# Patient Record
Sex: Female | Born: 1985 | Race: Black or African American | Hispanic: No | Marital: Single | Smoking: Never smoker
Health system: Southern US, Community
[De-identification: ages and names within clinical notes are randomized; demographics above are authoritative.]

## PROBLEM LIST (undated history)

## (undated) ENCOUNTER — Emergency Department (HOSPITAL_COMMUNITY): Admission: EM | Payer: Self-pay | Source: Home / Self Care

## (undated) ENCOUNTER — Inpatient Hospital Stay (HOSPITAL_COMMUNITY): Payer: Self-pay

## (undated) DIAGNOSIS — D75839 Thrombocytosis, unspecified: Secondary | ICD-10-CM

## (undated) DIAGNOSIS — R51 Headache: Secondary | ICD-10-CM

## (undated) DIAGNOSIS — K760 Fatty (change of) liver, not elsewhere classified: Secondary | ICD-10-CM

## (undated) DIAGNOSIS — K219 Gastro-esophageal reflux disease without esophagitis: Secondary | ICD-10-CM

## (undated) DIAGNOSIS — Z86718 Personal history of other venous thrombosis and embolism: Secondary | ICD-10-CM

## (undated) DIAGNOSIS — Z8719 Personal history of other diseases of the digestive system: Secondary | ICD-10-CM

## (undated) DIAGNOSIS — Z9289 Personal history of other medical treatment: Secondary | ICD-10-CM

## (undated) DIAGNOSIS — Z95828 Presence of other vascular implants and grafts: Secondary | ICD-10-CM

## (undated) DIAGNOSIS — I2699 Other pulmonary embolism without acute cor pulmonale: Secondary | ICD-10-CM

## (undated) DIAGNOSIS — R519 Headache, unspecified: Secondary | ICD-10-CM

## (undated) DIAGNOSIS — E119 Type 2 diabetes mellitus without complications: Secondary | ICD-10-CM

## (undated) DIAGNOSIS — O24419 Gestational diabetes mellitus in pregnancy, unspecified control: Secondary | ICD-10-CM

## (undated) DIAGNOSIS — M1990 Unspecified osteoarthritis, unspecified site: Secondary | ICD-10-CM

## (undated) DIAGNOSIS — I10 Essential (primary) hypertension: Secondary | ICD-10-CM

## (undated) DIAGNOSIS — N3090 Cystitis, unspecified without hematuria: Secondary | ICD-10-CM

## (undated) DIAGNOSIS — D473 Essential (hemorrhagic) thrombocythemia: Secondary | ICD-10-CM

## (undated) DIAGNOSIS — A4902 Methicillin resistant Staphylococcus aureus infection, unspecified site: Secondary | ICD-10-CM

## (undated) DIAGNOSIS — D649 Anemia, unspecified: Secondary | ICD-10-CM

## (undated) HISTORY — PX: WISDOM TOOTH EXTRACTION: SHX21

## (undated) HISTORY — DX: Methicillin resistant Staphylococcus aureus infection, unspecified site: A49.02

---
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
## 2006-02-28 ENCOUNTER — Emergency Department (HOSPITAL_COMMUNITY): Admission: EM | Admit: 2006-02-28 | Discharge: 2006-02-28 | Payer: Self-pay | Admitting: Family Medicine

## 2006-04-17 ENCOUNTER — Emergency Department (HOSPITAL_COMMUNITY): Admission: EM | Admit: 2006-04-17 | Discharge: 2006-04-18 | Payer: Self-pay | Admitting: Emergency Medicine

## 2006-04-20 ENCOUNTER — Emergency Department (HOSPITAL_COMMUNITY): Admission: EM | Admit: 2006-04-20 | Discharge: 2006-04-20 | Payer: Self-pay | Admitting: Family Medicine

## 2006-10-12 ENCOUNTER — Inpatient Hospital Stay (HOSPITAL_COMMUNITY): Admission: EM | Admit: 2006-10-12 | Discharge: 2006-10-14 | Payer: Self-pay | Admitting: Emergency Medicine

## 2007-03-21 ENCOUNTER — Emergency Department (HOSPITAL_COMMUNITY): Admission: EM | Admit: 2007-03-21 | Discharge: 2007-03-21 | Payer: Self-pay | Admitting: Family Medicine

## 2007-05-12 ENCOUNTER — Emergency Department (HOSPITAL_COMMUNITY): Admission: EM | Admit: 2007-05-12 | Discharge: 2007-05-12 | Payer: Self-pay | Admitting: Emergency Medicine

## 2007-05-19 ENCOUNTER — Emergency Department (HOSPITAL_COMMUNITY): Admission: EM | Admit: 2007-05-19 | Discharge: 2007-05-19 | Payer: Self-pay | Admitting: Emergency Medicine

## 2007-08-06 ENCOUNTER — Emergency Department (HOSPITAL_COMMUNITY): Admission: EM | Admit: 2007-08-06 | Discharge: 2007-08-07 | Payer: Self-pay | Admitting: Emergency Medicine

## 2007-08-19 ENCOUNTER — Inpatient Hospital Stay (HOSPITAL_COMMUNITY): Admission: AD | Admit: 2007-08-19 | Discharge: 2007-08-19 | Payer: Self-pay | Admitting: Family Medicine

## 2007-11-10 ENCOUNTER — Ambulatory Visit (HOSPITAL_COMMUNITY): Admission: RE | Admit: 2007-11-10 | Discharge: 2007-11-10 | Payer: Self-pay | Admitting: Obstetrics & Gynecology

## 2007-11-24 ENCOUNTER — Ambulatory Visit (HOSPITAL_COMMUNITY): Admission: RE | Admit: 2007-11-24 | Discharge: 2007-11-24 | Payer: Self-pay | Admitting: Obstetrics & Gynecology

## 2008-02-13 ENCOUNTER — Inpatient Hospital Stay (HOSPITAL_COMMUNITY): Admission: AD | Admit: 2008-02-13 | Discharge: 2008-02-13 | Payer: Self-pay | Admitting: Obstetrics & Gynecology

## 2008-04-06 ENCOUNTER — Ambulatory Visit: Payer: Self-pay | Admitting: Family Medicine

## 2008-04-06 ENCOUNTER — Inpatient Hospital Stay (HOSPITAL_COMMUNITY): Admission: AD | Admit: 2008-04-06 | Discharge: 2008-04-09 | Payer: Self-pay | Admitting: Obstetrics & Gynecology

## 2008-04-08 ENCOUNTER — Encounter (INDEPENDENT_AMBULATORY_CARE_PROVIDER_SITE_OTHER): Payer: Self-pay | Admitting: Pediatrics

## 2008-11-12 ENCOUNTER — Inpatient Hospital Stay (HOSPITAL_COMMUNITY): Admission: AD | Admit: 2008-11-12 | Discharge: 2008-11-12 | Payer: Self-pay | Admitting: Obstetrics and Gynecology

## 2009-01-26 ENCOUNTER — Inpatient Hospital Stay (HOSPITAL_COMMUNITY): Admission: AD | Admit: 2009-01-26 | Discharge: 2009-01-27 | Payer: Self-pay | Admitting: Obstetrics & Gynecology

## 2009-03-04 ENCOUNTER — Ambulatory Visit: Payer: Self-pay | Admitting: Family Medicine

## 2009-03-04 ENCOUNTER — Inpatient Hospital Stay (HOSPITAL_COMMUNITY): Admission: AD | Admit: 2009-03-04 | Discharge: 2009-03-04 | Payer: Self-pay | Admitting: Family Medicine

## 2009-03-16 ENCOUNTER — Inpatient Hospital Stay (HOSPITAL_COMMUNITY): Admission: AD | Admit: 2009-03-16 | Discharge: 2009-03-16 | Payer: Self-pay | Admitting: Obstetrics and Gynecology

## 2009-03-19 ENCOUNTER — Inpatient Hospital Stay (HOSPITAL_COMMUNITY): Admission: AD | Admit: 2009-03-19 | Discharge: 2009-03-19 | Payer: Self-pay | Admitting: Family Medicine

## 2009-03-20 ENCOUNTER — Inpatient Hospital Stay (HOSPITAL_COMMUNITY): Admission: AD | Admit: 2009-03-20 | Discharge: 2009-03-22 | Payer: Self-pay | Admitting: Obstetrics & Gynecology

## 2009-03-20 ENCOUNTER — Ambulatory Visit: Payer: Self-pay | Admitting: Obstetrics and Gynecology

## 2009-04-13 ENCOUNTER — Emergency Department (HOSPITAL_COMMUNITY): Admission: EM | Admit: 2009-04-13 | Discharge: 2009-04-13 | Payer: Self-pay | Admitting: Family Medicine

## 2009-04-14 ENCOUNTER — Inpatient Hospital Stay (HOSPITAL_COMMUNITY): Admission: EM | Admit: 2009-04-14 | Discharge: 2009-04-19 | Payer: Self-pay | Admitting: Emergency Medicine

## 2009-04-14 ENCOUNTER — Ambulatory Visit: Payer: Self-pay | Admitting: Vascular Surgery

## 2009-04-14 ENCOUNTER — Ambulatory Visit: Payer: Self-pay | Admitting: Pulmonary Disease

## 2009-04-14 ENCOUNTER — Encounter (INDEPENDENT_AMBULATORY_CARE_PROVIDER_SITE_OTHER): Payer: Self-pay | Admitting: Internal Medicine

## 2009-04-22 ENCOUNTER — Inpatient Hospital Stay (HOSPITAL_COMMUNITY): Admission: EM | Admit: 2009-04-22 | Discharge: 2009-04-30 | Payer: Self-pay | Admitting: Emergency Medicine

## 2009-04-22 ENCOUNTER — Ambulatory Visit: Payer: Self-pay | Admitting: Hematology & Oncology

## 2009-05-01 ENCOUNTER — Emergency Department (HOSPITAL_COMMUNITY): Admission: EM | Admit: 2009-05-01 | Discharge: 2009-05-01 | Payer: Self-pay | Admitting: Emergency Medicine

## 2009-05-02 ENCOUNTER — Ambulatory Visit: Payer: Self-pay | Admitting: Hematology & Oncology

## 2009-05-04 ENCOUNTER — Encounter: Payer: Self-pay | Admitting: Internal Medicine

## 2009-05-04 LAB — CBC WITH DIFFERENTIAL (CANCER CENTER ONLY)
BASO#: 0.1 10*3/uL (ref 0.0–0.2)
BASO%: 0.6 % (ref 0.0–2.0)
EOS%: 3.5 % (ref 0.0–7.0)
HCT: 31.2 % — ABNORMAL LOW (ref 34.8–46.6)
HGB: 9.6 g/dL — ABNORMAL LOW (ref 11.6–15.9)
LYMPH#: 1.9 10*3/uL (ref 0.9–3.3)
MCH: 21.1 pg — ABNORMAL LOW (ref 26.0–34.0)
MCHC: 30.9 g/dL — ABNORMAL LOW (ref 32.0–36.0)
MCV: 68 fL — ABNORMAL LOW (ref 81–101)
MONO%: 4.7 % (ref 0.0–13.0)
Platelets: 509 10*3/uL — ABNORMAL HIGH (ref 145–400)
RBC: 4.57 10*6/uL (ref 3.70–5.32)
RDW: 22.2 % — ABNORMAL HIGH (ref 10.5–14.6)

## 2009-05-04 LAB — HOLD TUBE, BLOOD BANK - CHCC SATELLITE

## 2009-05-05 LAB — FERRITIN: Ferritin: 85 ng/mL (ref 10–291)

## 2009-05-05 LAB — RETICULOCYTES (CHCC): RBC.: 4.45 MIL/uL (ref 3.87–5.11)

## 2009-06-09 ENCOUNTER — Ambulatory Visit: Payer: Self-pay | Admitting: Hematology & Oncology

## 2009-07-27 ENCOUNTER — Ambulatory Visit: Payer: Self-pay | Admitting: Hematology & Oncology

## 2009-10-23 ENCOUNTER — Inpatient Hospital Stay (HOSPITAL_COMMUNITY): Admission: AD | Admit: 2009-10-23 | Discharge: 2009-10-24 | Payer: Self-pay | Admitting: Obstetrics and Gynecology

## 2009-10-23 ENCOUNTER — Ambulatory Visit: Payer: Self-pay | Admitting: Family

## 2010-01-29 DIAGNOSIS — Z9289 Personal history of other medical treatment: Secondary | ICD-10-CM

## 2010-01-29 HISTORY — DX: Personal history of other medical treatment: Z92.89

## 2010-02-03 ENCOUNTER — Ambulatory Visit: Payer: Self-pay | Admitting: Hematology & Oncology

## 2010-02-06 ENCOUNTER — Encounter: Payer: Self-pay | Admitting: Internal Medicine

## 2010-02-06 LAB — CBC WITH DIFFERENTIAL (CANCER CENTER ONLY)
BASO#: 0.1 10*3/uL (ref 0.0–0.2)
BASO%: 0.5 % (ref 0.0–2.0)
EOS%: 1.8 % (ref 0.0–7.0)
Eosinophils Absolute: 0.2 10*3/uL (ref 0.0–0.5)
HCT: 36 % (ref 34.8–46.6)
HGB: 11.6 g/dL (ref 11.6–15.9)
LYMPH#: 2.8 10*3/uL (ref 0.9–3.3)
LYMPH%: 23.6 % (ref 14.0–48.0)
MCH: 21.9 pg — ABNORMAL LOW (ref 26.0–34.0)
MCHC: 32.2 g/dL (ref 32.0–36.0)
MCV: 68 fL — ABNORMAL LOW (ref 81–101)
MONO#: 0.4 10*3/uL (ref 0.1–0.9)
MONO%: 3.2 % (ref 0.0–13.0)
NEUT#: 8.3 10*3/uL — ABNORMAL HIGH (ref 1.5–6.5)
NEUT%: 70.9 % (ref 39.6–80.0)
Platelets: 429 10*3/uL — ABNORMAL HIGH (ref 145–400)
RBC: 5.29 10*6/uL (ref 3.70–5.32)
RDW: 17.6 % — ABNORMAL HIGH (ref 10.5–14.6)
WBC: 11.7 10*3/uL — ABNORMAL HIGH (ref 3.9–10.0)

## 2010-02-06 LAB — FERRITIN: Ferritin: 23 ng/mL (ref 10–291)

## 2010-02-06 LAB — TECHNOLOGIST REVIEW CHCC SATELLITE

## 2010-02-19 ENCOUNTER — Encounter: Payer: Self-pay | Admitting: Emergency Medicine

## 2010-03-02 NOTE — Letter (Signed)
[facility]   Imported By: Sherian Rein 05/30/2009 14:09:43  _____________________________________________________________________  External Attachment:    Type:   Image     Comment:   External Document

## 2010-03-22 NOTE — Letter (Signed)
Summary: [facility]   Imported By: Lennie Odor 03/17/2010 09:09:39  _____________________________________________________________________  External Attachment:    Type:   Image     Comment:   External Document

## 2010-04-19 LAB — CBC
HCT: 29.1 % — ABNORMAL LOW (ref 36.0–46.0)
HCT: 27 % — ABNORMAL LOW (ref 36.0–46.0)
Hemoglobin: 8.7 g/dL — ABNORMAL LOW (ref 12.0–15.0)
Hemoglobin: 8.6 g/dL — ABNORMAL LOW (ref 12.0–15.0)
MCHC: 31 g/dL (ref 30.0–36.0)
MCHC: 31.7 g/dL (ref 30.0–36.0)
MCV: 68.1 fL — ABNORMAL LOW (ref 78.0–100.0)
MCV: 68.2 fL — ABNORMAL LOW (ref 78.0–100.0)
RBC: 3.65 MIL/uL — ABNORMAL LOW (ref 3.87–5.11)
RBC: 3.96 MIL/uL (ref 3.87–5.11)
WBC: 11.8 10*3/uL — ABNORMAL HIGH (ref 4.0–10.5)

## 2010-04-19 LAB — WET PREP, GENITAL
Trich, Wet Prep: NONE SEEN
Yeast Wet Prep HPF POC: NONE SEEN

## 2010-04-19 LAB — URINALYSIS, ROUTINE W REFLEX MICROSCOPIC
Glucose, UA: NEGATIVE mg/dL
Hgb urine dipstick: NEGATIVE
Ketones, ur: NEGATIVE mg/dL
Protein, ur: NEGATIVE mg/dL
Urobilinogen, UA: 0.2 mg/dL (ref 0.0–1.0)

## 2010-04-19 LAB — BASIC METABOLIC PANEL
BUN: 3 mg/dL — ABNORMAL LOW (ref 6–23)
CO2: 24 mEq/L (ref 19–32)
CO2: 25 mEq/L (ref 19–32)
Chloride: 104 mEq/L (ref 96–112)
Chloride: 108 mEq/L (ref 96–112)
Creatinine, Ser: 0.78 mg/dL (ref 0.4–1.2)
GFR calc Af Amer: >60 mL/min (ref >60)
GFR calc Af Amer: >60 mL/min (ref >60)
Potassium: 3.3 mEq/L — ABNORMAL LOW (ref 3.5–5.1)
Sodium: 139 mEq/L (ref 135–145)

## 2010-04-19 LAB — GRAM STAIN

## 2010-04-19 LAB — CROSSMATCH: Antibody Screen: NEGATIVE

## 2010-04-19 LAB — DIFFERENTIAL
Basophils Relative: 0 % (ref 0–1)
Eosinophils Absolute: 0.2 10*3/uL (ref 0.0–0.7)
Monocytes Relative: 6 % (ref 3–12)
Neutrophils Relative %: 80 % — ABNORMAL HIGH (ref 43–77)

## 2010-04-19 LAB — URINE CULTURE

## 2010-04-24 LAB — CBC
HCT: 22.5 % — ABNORMAL LOW (ref 36.0–46.0)
HCT: 23.3 % — ABNORMAL LOW (ref 36.0–46.0)
HCT: 26.8 % — ABNORMAL LOW (ref 36.0–46.0)
HCT: 27 % — ABNORMAL LOW (ref 36.0–46.0)
HCT: 28.1 % — ABNORMAL LOW (ref 36.0–46.0)
HCT: 28.5 % — ABNORMAL LOW (ref 36.0–46.0)
HCT: 28.5 % — ABNORMAL LOW (ref 36.0–46.0)
HCT: 29 % — ABNORMAL LOW (ref 36.0–46.0)
HCT: 29 % — ABNORMAL LOW (ref 36.0–46.0)
HCT: 29.3 % — ABNORMAL LOW (ref 36.0–46.0)
HCT: 29.6 % — ABNORMAL LOW (ref 36.0–46.0)
Hemoglobin: 7.2 g/dL — ABNORMAL LOW (ref 12.0–15.0)
Hemoglobin: 8.3 g/dL — ABNORMAL LOW (ref 12.0–15.0)
Hemoglobin: 8.5 g/dL — ABNORMAL LOW (ref 12.0–15.0)
Hemoglobin: 8.8 g/dL — ABNORMAL LOW (ref 12.0–15.0)
Hemoglobin: 9.1 g/dL — ABNORMAL LOW (ref 12.0–15.0)
Hemoglobin: 9.3 g/dL — ABNORMAL LOW (ref 12.0–15.0)
MCHC: 30.4 g/dL (ref 30.0–36.0)
MCHC: 31.3 g/dL (ref 30.0–36.0)
MCHC: 31.3 g/dL (ref 30.0–36.0)
MCV: 64.5 fL — ABNORMAL LOW (ref 78.0–100.0)
MCV: 67.4 fL — ABNORMAL LOW (ref 78.0–100.0)
MCV: 67.9 fL — ABNORMAL LOW (ref 78.0–100.0)
MCV: 68.1 fL — ABNORMAL LOW (ref 78.0–100.0)
MCV: 68.2 fL — ABNORMAL LOW (ref 78.0–100.0)
MCV: 68.2 fL — ABNORMAL LOW (ref 78.0–100.0)
Platelets: 222 10*3/uL (ref 150–400)
Platelets: 267 10*3/uL (ref 150–400)
Platelets: 273 10*3/uL (ref 150–400)
Platelets: 282 10*3/uL (ref 150–400)
Platelets: 348 10*3/uL (ref 150–400)
Platelets: 370 10*3/uL (ref 150–400)
Platelets: 375 10*3/uL (ref 150–400)
Platelets: 387 10*3/uL (ref 150–400)
Platelets: 428 10*3/uL — ABNORMAL HIGH (ref 150–400)
Platelets: 429 10*3/uL — ABNORMAL HIGH (ref 150–400)
RBC: 3.82 MIL/uL — ABNORMAL LOW (ref 3.87–5.11)
RBC: 3.88 MIL/uL (ref 3.87–5.11)
RBC: 3.97 MIL/uL (ref 3.87–5.11)
RBC: 4.12 MIL/uL (ref 3.87–5.11)
RBC: 4.24 MIL/uL (ref 3.87–5.11)
RBC: 4.35 MIL/uL (ref 3.87–5.11)
RDW: 24.9 % — ABNORMAL HIGH (ref 11.5–15.5)
RDW: 27.5 % — ABNORMAL HIGH (ref 11.5–15.5)
RDW: 28 % — ABNORMAL HIGH (ref 11.5–15.5)
RDW: 28.6 % — ABNORMAL HIGH (ref 11.5–15.5)
RDW: 28.6 % — ABNORMAL HIGH (ref 11.5–15.5)
RDW: 28.8 % — ABNORMAL HIGH (ref 11.5–15.5)
RDW: 28.8 % — ABNORMAL HIGH (ref 11.5–15.5)
WBC: 10.9 10*3/uL — ABNORMAL HIGH (ref 4.0–10.5)
WBC: 11.4 10*3/uL — ABNORMAL HIGH (ref 4.0–10.5)
WBC: 11.6 10*3/uL — ABNORMAL HIGH (ref 4.0–10.5)
WBC: 12.6 10*3/uL — ABNORMAL HIGH (ref 4.0–10.5)
WBC: 12.6 10*3/uL — ABNORMAL HIGH (ref 4.0–10.5)
WBC: 9.2 10*3/uL (ref 4.0–10.5)
WBC: 9.4 10*3/uL (ref 4.0–10.5)
WBC: 9.5 10*3/uL (ref 4.0–10.5)
WBC: 9.5 10*3/uL (ref 4.0–10.5)
WBC: 9.7 10*3/uL (ref 4.0–10.5)
WBC: 9.9 10*3/uL (ref 4.0–10.5)

## 2010-04-24 LAB — COMPREHENSIVE METABOLIC PANEL
ALT: 11 U/L (ref 0–35)
AST: 36 U/L (ref 0–37)
AST: 9 U/L (ref 0–37)
Albumin: 2.3 g/dL — ABNORMAL LOW (ref 3.5–5.2)
Alkaline Phosphatase: 71 U/L (ref 39–117)
BUN: 10 mg/dL (ref 6–23)
CO2: 25 mEq/L (ref 19–32)
Chloride: 107 mEq/L (ref 96–112)
Chloride: 113 mEq/L — ABNORMAL HIGH (ref 96–112)
Creatinine, Ser: 0.79 mg/dL (ref 0.4–1.2)
GFR calc Af Amer: >60 mL/min (ref >60)
GFR calc non Af Amer: >60 mL/min (ref >60)
Glucose, Bld: 94 mg/dL (ref 70–99)
Potassium: 3.7 mEq/L (ref 3.5–5.1)
Sodium: 141 mEq/L (ref 135–145)
Total Bilirubin: 0.3 mg/dL (ref 0.3–1.2)
Total Bilirubin: 0.3 mg/dL (ref 0.3–1.2)
Total Protein: 5.7 g/dL — ABNORMAL LOW (ref 6.0–8.3)

## 2010-04-24 LAB — DIFFERENTIAL
Basophils Absolute: 0 10*3/uL (ref 0.0–0.1)
Basophils Absolute: 0 10*3/uL (ref 0.0–0.1)
Basophils Absolute: 0 10*3/uL (ref 0.0–0.1)
Basophils Absolute: 0.1 10*3/uL (ref 0.0–0.1)
Basophils Absolute: 0.1 10*3/uL (ref 0.0–0.1)
Basophils Absolute: 0.1 10*3/uL (ref 0.0–0.1)
Basophils Relative: 0 % (ref 0–1)
Basophils Relative: 1 % (ref 0–1)
Eosinophils Absolute: 0 10*3/uL (ref 0.0–0.7)
Eosinophils Absolute: 0.1 10*3/uL (ref 0.0–0.7)
Eosinophils Absolute: 0.1 10*3/uL (ref 0.0–0.7)
Eosinophils Absolute: 0.1 10*3/uL (ref 0.0–0.7)
Eosinophils Absolute: 0.2 10*3/uL (ref 0.0–0.7)
Eosinophils Relative: 1 % (ref 0–5)
Eosinophils Relative: 2 % (ref 0–5)
Eosinophils Relative: 2 % (ref 0–5)
Eosinophils Relative: 2 % (ref 0–5)
Lymphocytes Relative: 11 % — ABNORMAL LOW (ref 12–46)
Lymphocytes Relative: 16 % (ref 12–46)
Lymphocytes Relative: 19 % (ref 12–46)
Lymphocytes Relative: 9 % — ABNORMAL LOW (ref 12–46)
Lymphocytes Relative: 9 % — ABNORMAL LOW (ref 12–46)
Lymphs Abs: 1.1 10*3/uL (ref 0.7–4.0)
Lymphs Abs: 1.7 10*3/uL (ref 0.7–4.0)
Lymphs Abs: 2 10*3/uL (ref 0.7–4.0)
Lymphs Abs: 2.3 10*3/uL (ref 0.7–4.0)
Monocytes Absolute: 0.5 10*3/uL (ref 0.1–1.0)
Monocytes Absolute: 0.6 10*3/uL (ref 0.1–1.0)
Monocytes Absolute: 0.7 10*3/uL (ref 0.1–1.0)
Monocytes Absolute: 0.8 10*3/uL (ref 0.1–1.0)
Monocytes Relative: 5 % (ref 3–12)
Monocytes Relative: 5 % (ref 3–12)
Monocytes Relative: 6 % (ref 3–12)
Neutro Abs: 10.3 10*3/uL — ABNORMAL HIGH (ref 1.7–7.7)
Neutro Abs: 6 10*3/uL (ref 1.7–7.7)
Neutro Abs: 7.8 10*3/uL — ABNORMAL HIGH (ref 1.7–7.7)
Neutro Abs: 9.1 10*3/uL — ABNORMAL HIGH (ref 1.7–7.7)
Neutro Abs: 9.7 10*3/uL — ABNORMAL HIGH (ref 1.7–7.7)
Neutrophils Relative %: 67 % (ref 43–77)
Neutrophils Relative %: 71 % (ref 43–77)
Neutrophils Relative %: 72 % (ref 43–77)
Neutrophils Relative %: 83 % — ABNORMAL HIGH (ref 43–77)
Neutrophils Relative %: 86 % — ABNORMAL HIGH (ref 43–77)

## 2010-04-24 LAB — PROTHROMBIN GENE MUTATION

## 2010-04-24 LAB — RETICULOCYTES: Retic Ct Pct: 0.9 % (ref 0.4–3.1)

## 2010-04-24 LAB — FIBRINOGEN
Fibrinogen: 240 mg/dL (ref 204–475)
Fibrinogen: 327 mg/dL (ref 204–475)
Fibrinogen: 460 mg/dL (ref 204–475)
Fibrinogen: 652 mg/dL — ABNORMAL HIGH (ref 204–475)

## 2010-04-24 LAB — GLUCOSE, CAPILLARY
Glucose-Capillary: 111 mg/dL — ABNORMAL HIGH (ref 70–99)
Glucose-Capillary: 88 mg/dL (ref 70–99)

## 2010-04-24 LAB — VANCOMYCIN, TROUGH
Vancomycin Tr: 15 ug/mL (ref 10.0–20.0)
Vancomycin Tr: 29.4 ug/mL (ref 10.0–20.0)

## 2010-04-24 LAB — POCT CARDIAC MARKERS
Troponin i, poc: 0.1 ng/mL — ABNORMAL HIGH (ref 0.00–0.09)
Troponin i, poc: 0.45 ng/mL (ref 0.00–0.09)

## 2010-04-24 LAB — POCT I-STAT, CHEM 8
Calcium, Ion: 1.12 mmol/L (ref 1.12–1.32)
Creatinine, Ser: 1.2 mg/dL (ref 0.4–1.2)
Glucose, Bld: 93 mg/dL (ref 70–99)
HCT: 30 % — ABNORMAL LOW (ref 36.0–46.0)
Hemoglobin: 10.2 g/dL — ABNORMAL LOW (ref 12.0–15.0)

## 2010-04-24 LAB — BASIC METABOLIC PANEL
BUN: 2 mg/dL — ABNORMAL LOW (ref 6–23)
BUN: 3 mg/dL — ABNORMAL LOW (ref 6–23)
CO2: 24 mEq/L (ref 19–32)
CO2: 25 mEq/L (ref 19–32)
Calcium: 7.8 mg/dL — ABNORMAL LOW (ref 8.4–10.5)
Calcium: 8.4 mg/dL (ref 8.4–10.5)
Calcium: 8.6 mg/dL (ref 8.4–10.5)
Calcium: 8.8 mg/dL (ref 8.4–10.5)
Chloride: 102 mEq/L (ref 96–112)
Chloride: 105 mEq/L (ref 96–112)
Chloride: 106 mEq/L (ref 96–112)
Chloride: 109 mEq/L (ref 96–112)
Chloride: 110 mEq/L (ref 96–112)
Creatinine, Ser: 0.58 mg/dL (ref 0.4–1.2)
Creatinine, Ser: 0.64 mg/dL (ref 0.4–1.2)
Creatinine, Ser: 0.76 mg/dL (ref 0.4–1.2)
Creatinine, Ser: 0.76 mg/dL (ref 0.4–1.2)
GFR calc Af Amer: >60 mL/min (ref >60)
GFR calc Af Amer: >60 mL/min (ref >60)
GFR calc Af Amer: >60 mL/min (ref >60)
GFR calc non Af Amer: >60 mL/min (ref >60)
GFR calc non Af Amer: >60 mL/min (ref >60)
GFR calc non Af Amer: >60 mL/min (ref >60)
GFR calc non Af Amer: >60 mL/min (ref >60)
GFR calc non Af Amer: >60 mL/min (ref >60)
GFR calc non Af Amer: >60 mL/min (ref >60)
GFR calc non Af Amer: >60 mL/min (ref >60)
Glucose, Bld: 86 mg/dL (ref 70–99)
Glucose, Bld: 88 mg/dL (ref 70–99)
Glucose, Bld: 90 mg/dL (ref 70–99)
Glucose, Bld: 96 mg/dL (ref 70–99)
Glucose, Bld: 98 mg/dL (ref 70–99)
Potassium: 3.4 mEq/L — ABNORMAL LOW (ref 3.5–5.1)
Potassium: 3.5 mEq/L (ref 3.5–5.1)
Potassium: 3.6 mEq/L (ref 3.5–5.1)
Potassium: 3.7 mEq/L (ref 3.5–5.1)
Sodium: 137 mEq/L (ref 135–145)
Sodium: 139 mEq/L (ref 135–145)
Sodium: 139 mEq/L (ref 135–145)
Sodium: 141 mEq/L (ref 135–145)
Sodium: 142 mEq/L (ref 135–145)

## 2010-04-24 LAB — CULTURE, BLOOD (ROUTINE X 2)
Culture: NO GROWTH
Culture: NO GROWTH

## 2010-04-24 LAB — ANTITHROMBIN III: AntiThromb III Func: 131 % — ABNORMAL HIGH (ref 76–126)

## 2010-04-24 LAB — PROTEIN S ACTIVITY: Protein S Activity: 78 % (ref 69–129)

## 2010-04-24 LAB — CK TOTAL AND CKMB (NOT AT ARMC)
CK, MB: 0.3 ng/mL (ref 0.3–4.0)
Total CK: 65 U/L (ref 7–177)

## 2010-04-24 LAB — HEPARIN LEVEL (UNFRACTIONATED)
Heparin Unfractionated: 0.12 IU/mL — ABNORMAL LOW (ref 0.30–0.70)
Heparin Unfractionated: 0.17 IU/mL — ABNORMAL LOW (ref 0.30–0.70)
Heparin Unfractionated: 0.2 IU/mL — ABNORMAL LOW (ref 0.30–0.70)
Heparin Unfractionated: 0.21 IU/mL — ABNORMAL LOW (ref 0.30–0.70)
Heparin Unfractionated: 0.26 IU/mL — ABNORMAL LOW (ref 0.30–0.70)
Heparin Unfractionated: 0.28 IU/mL — ABNORMAL LOW (ref 0.30–0.70)
Heparin Unfractionated: 0.3 IU/mL (ref 0.30–0.70)
Heparin Unfractionated: 0.31 IU/mL (ref 0.30–0.70)
Heparin Unfractionated: 0.37 IU/mL (ref 0.30–0.70)
Heparin Unfractionated: 0.38 IU/mL (ref 0.30–0.70)
Heparin Unfractionated: 0.4 IU/mL (ref 0.30–0.70)
Heparin Unfractionated: 0.44 IU/mL (ref 0.30–0.70)
Heparin Unfractionated: 0.6 IU/mL (ref 0.30–0.70)
Heparin Unfractionated: 0.72 IU/mL — ABNORMAL HIGH (ref 0.30–0.70)
Heparin Unfractionated: <0.1 IU/mL — ABNORMAL LOW (ref 0.30–0.70)

## 2010-04-24 LAB — URINE MICROSCOPIC-ADD ON

## 2010-04-24 LAB — URINALYSIS, ROUTINE W REFLEX MICROSCOPIC
Hgb urine dipstick: NEGATIVE
Hgb urine dipstick: NEGATIVE
Nitrite: POSITIVE — AB
Protein, ur: 100 mg/dL — AB
Specific Gravity, Urine: 1.041 — ABNORMAL HIGH (ref 1.005–1.030)
Urobilinogen, UA: 1 mg/dL (ref 0.0–1.0)
pH: 6 (ref 5.0–8.0)

## 2010-04-24 LAB — LUPUS ANTICOAGULANT PANEL
PTT Lupus Anticoagulant: 83.8 secs — ABNORMAL HIGH (ref 32.0–43.4)
PTTLA 4:1 Mix: 70 secs — ABNORMAL HIGH (ref 36.3–48.8)
PTTLA Confirmation: 13.6 secs — ABNORMAL HIGH (ref <8.0)
dRVVT Incubated 1:1 Mix: 50 secs — ABNORMAL HIGH (ref 36.2–44.3)

## 2010-04-24 LAB — CARDIAC PANEL(CRET KIN+CKTOT+MB+TROPI)
CK, MB: 0.4 ng/mL (ref 0.3–4.0)
Troponin I: 0.02 ng/mL (ref 0.00–0.06)

## 2010-04-24 LAB — PROTIME-INR
INR: 1.34 (ref 0.00–1.49)
INR: 1.93 — ABNORMAL HIGH (ref 0.00–1.49)
INR: 2.45 — ABNORMAL HIGH (ref 0.00–1.49)
INR: 4.46 — ABNORMAL HIGH (ref 0.00–1.49)
INR: 4.67 — ABNORMAL HIGH (ref 0.00–1.49)
Prothrombin Time: 16 seconds — ABNORMAL HIGH (ref 11.6–15.2)
Prothrombin Time: 16.5 seconds — ABNORMAL HIGH (ref 11.6–15.2)
Prothrombin Time: 21.9 seconds — ABNORMAL HIGH (ref 11.6–15.2)
Prothrombin Time: 23.6 seconds — ABNORMAL HIGH (ref 11.6–15.2)
Prothrombin Time: 27.3 seconds — ABNORMAL HIGH (ref 11.6–15.2)
Prothrombin Time: 42.1 seconds — ABNORMAL HIGH (ref 11.6–15.2)
Prothrombin Time: 43.7 seconds — ABNORMAL HIGH (ref 11.6–15.2)

## 2010-04-24 LAB — CROSSMATCH
ABO/RH(D): A POS
Antibody Screen: NEGATIVE

## 2010-04-24 LAB — URINE CULTURE

## 2010-04-24 LAB — FOLATE
Folate: 5.8 ng/mL
Folate: 6.2 ng/mL

## 2010-04-24 LAB — LIPID PANEL
HDL: 30 mg/dL — ABNORMAL LOW (ref >39)
Triglycerides: 228 mg/dL — ABNORMAL HIGH (ref <150)
VLDL: 46 mg/dL — ABNORMAL HIGH (ref 0–40)

## 2010-04-24 LAB — IRON AND TIBC
Saturation Ratios: 5 % — ABNORMAL LOW (ref 20–55)
TIBC: 236 ug/dL — ABNORMAL LOW (ref 250–470)
UIBC: 224 ug/dL

## 2010-04-24 LAB — PROTEIN C ACTIVITY: Protein C Activity: 120 % (ref 75–133)

## 2010-04-24 LAB — ANTIPHOSPHOLIPID SYNDROME EVAL, BLD
Anticardiolipin IgA: 4 APL U/mL — ABNORMAL LOW (ref <10)
Phosphatydalserine, IgG: <10 U/mL (ref <10)

## 2010-04-24 LAB — CARDIOLIPIN ANTIBODIES, IGG, IGM, IGA: Anticardiolipin IgA: <3 APL U/mL — ABNORMAL LOW (ref <10)

## 2010-04-24 LAB — MRSA PCR SCREENING: MRSA by PCR: NEGATIVE

## 2010-04-24 LAB — FACTOR 5 LEIDEN

## 2010-04-24 LAB — LACTIC ACID, PLASMA
Lactic Acid, Venous: 0.8 mmol/L (ref 0.5–2.2)
Lactic Acid, Venous: 1 mmol/L (ref 0.5–2.2)

## 2010-04-24 LAB — BRAIN NATRIURETIC PEPTIDE: Pro B Natriuretic peptide (BNP): 85 pg/mL (ref 0.0–100.0)

## 2010-04-24 LAB — FERRITIN: Ferritin: 56 ng/mL (ref 10–291)

## 2010-04-24 LAB — TROPONIN I: Troponin I: 0.02 ng/mL (ref 0.00–0.06)

## 2010-04-24 LAB — VITAMIN B12: Vitamin B-12: 253 pg/mL (ref 211–911)

## 2010-04-28 ENCOUNTER — Inpatient Hospital Stay (INDEPENDENT_AMBULATORY_CARE_PROVIDER_SITE_OTHER)
Admission: RE | Admit: 2010-04-28 | Discharge: 2010-04-28 | Disposition: A | Discharge status: Home / Self Care | Payer: Self-pay | Source: Ambulatory Visit | Attending: Family Medicine | Admitting: Family Medicine

## 2010-04-28 DIAGNOSIS — N390 Urinary tract infection, site not specified: Secondary | ICD-10-CM

## 2010-04-28 LAB — POCT PREGNANCY, URINE: Preg Test, Ur: NEGATIVE

## 2010-04-28 LAB — POCT URINALYSIS DIP (DEVICE)
Glucose, UA: NEGATIVE mg/dL
Hgb urine dipstick: NEGATIVE
Nitrite: POSITIVE — AB
Urobilinogen, UA: 0.2 mg/dL (ref 0.0–1.0)

## 2010-05-01 LAB — CULTURE, ROUTINE-ABSCESS: Gram Stain: NONE SEEN

## 2010-05-01 LAB — CBC
HCT: 24.5 % — ABNORMAL LOW (ref 36.0–46.0)
Hemoglobin: 7.4 g/dL — ABNORMAL LOW (ref 12.0–15.0)
MCHC: 30.3 g/dL (ref 30.0–36.0)
MCV: 61.3 fL — ABNORMAL LOW (ref 78.0–100.0)
Platelets: 337 10*3/uL (ref 150–400)
RBC: 4.15 MIL/uL (ref 3.87–5.11)
RDW: 23.5 % — ABNORMAL HIGH (ref 11.5–15.5)
WBC: 12.8 10*3/uL — ABNORMAL HIGH (ref 4.0–10.5)

## 2010-05-01 LAB — URINE CULTURE

## 2010-05-01 LAB — WOUND CULTURE

## 2010-05-04 LAB — WET PREP, GENITAL
Clue Cells Wet Prep HPF POC: NONE SEEN
Trich, Wet Prep: NONE SEEN
Yeast Wet Prep HPF POC: NONE SEEN

## 2010-05-04 LAB — URINE MICROSCOPIC-ADD ON

## 2010-05-04 LAB — URINE CULTURE

## 2010-05-04 LAB — URINALYSIS, ROUTINE W REFLEX MICROSCOPIC
Bilirubin Urine: NEGATIVE
Glucose, UA: NEGATIVE mg/dL
Specific Gravity, Urine: 1.015 (ref 1.005–1.030)
Urobilinogen, UA: 1 mg/dL (ref 0.0–1.0)
pH: 7 (ref 5.0–8.0)

## 2010-05-04 LAB — HERPES SIMPLEX VIRUS CULTURE

## 2010-05-11 LAB — CBC
HCT: 30.4 % — ABNORMAL LOW (ref 36.0–46.0)
Hemoglobin: 9.6 g/dL — ABNORMAL LOW (ref 12.0–15.0)
MCHC: 31.5 g/dL (ref 30.0–36.0)
RBC: 4.62 MIL/uL (ref 3.87–5.11)

## 2010-05-15 LAB — URINE MICROSCOPIC-ADD ON

## 2010-05-15 LAB — URINALYSIS, ROUTINE W REFLEX MICROSCOPIC
Glucose, UA: NEGATIVE mg/dL
Hgb urine dipstick: NEGATIVE
Ketones, ur: NEGATIVE mg/dL
pH: 6.5 (ref 5.0–8.0)

## 2010-05-15 LAB — GC/CHLAMYDIA PROBE AMP, GENITAL: GC Probe Amp, Genital: NEGATIVE

## 2010-05-16 ENCOUNTER — Emergency Department (HOSPITAL_COMMUNITY)
Admission: EM | Admit: 2010-05-16 | Discharge: 2010-05-17 | Disposition: A | Discharge status: Home / Self Care | Payer: Medicaid Other | Attending: Emergency Medicine | Admitting: Emergency Medicine

## 2010-05-16 ENCOUNTER — Emergency Department (HOSPITAL_COMMUNITY): Payer: Medicaid Other

## 2010-05-16 DIAGNOSIS — R0789 Other chest pain: Secondary | ICD-10-CM | POA: Insufficient documentation

## 2010-05-16 DIAGNOSIS — R000 Tachycardia, unspecified: Secondary | ICD-10-CM | POA: Insufficient documentation

## 2010-05-16 DIAGNOSIS — Z86718 Personal history of other venous thrombosis and embolism: Secondary | ICD-10-CM | POA: Insufficient documentation

## 2010-05-16 DIAGNOSIS — R0602 Shortness of breath: Secondary | ICD-10-CM | POA: Insufficient documentation

## 2010-05-16 DIAGNOSIS — N390 Urinary tract infection, site not specified: Secondary | ICD-10-CM | POA: Insufficient documentation

## 2010-05-16 DIAGNOSIS — R42 Dizziness and giddiness: Secondary | ICD-10-CM | POA: Insufficient documentation

## 2010-05-16 DIAGNOSIS — Z79899 Other long term (current) drug therapy: Secondary | ICD-10-CM | POA: Insufficient documentation

## 2010-05-16 DIAGNOSIS — J45909 Unspecified asthma, uncomplicated: Secondary | ICD-10-CM | POA: Insufficient documentation

## 2010-05-16 LAB — URINALYSIS, ROUTINE W REFLEX MICROSCOPIC
Bilirubin Urine: NEGATIVE
Glucose, UA: NEGATIVE mg/dL
Hgb urine dipstick: NEGATIVE
Ketones, ur: NEGATIVE mg/dL
pH: 7.5 (ref 5.0–8.0)

## 2010-05-16 LAB — DIFFERENTIAL
Lymphocytes Relative: 27 % (ref 12–46)
Lymphs Abs: 3.8 10*3/uL (ref 0.7–4.0)
Monocytes Relative: 5 % (ref 3–12)
Neutrophils Relative %: 66 % (ref 43–77)

## 2010-05-16 LAB — CBC
HCT: 35.8 % — ABNORMAL LOW (ref 36.0–46.0)
Hemoglobin: 11.2 g/dL — ABNORMAL LOW (ref 12.0–15.0)
MCV: 67.8 fL — ABNORMAL LOW (ref 78.0–100.0)
RBC: 5.28 MIL/uL — ABNORMAL HIGH (ref 3.87–5.11)
WBC: 14.2 10*3/uL — ABNORMAL HIGH (ref 4.0–10.5)

## 2010-05-16 LAB — POCT PREGNANCY, URINE: Preg Test, Ur: NEGATIVE

## 2010-05-16 LAB — URINE MICROSCOPIC-ADD ON

## 2010-05-16 LAB — BASIC METABOLIC PANEL
BUN: 6 mg/dL (ref 6–23)
CO2: 25 mEq/L (ref 19–32)
Chloride: 105 mEq/L (ref 96–112)
Glucose, Bld: 88 mg/dL (ref 70–99)
Potassium: 3.5 mEq/L (ref 3.5–5.1)

## 2010-05-16 MED ORDER — IOHEXOL 300 MG/ML  SOLN
100.0000 mL | Freq: Once | INTRAMUSCULAR | Status: AC | PRN
  Administered 2010-05-16: 100 mL via INTRAVENOUS

## 2010-05-18 LAB — URINE CULTURE

## 2010-05-26 ENCOUNTER — Other Ambulatory Visit: Payer: Self-pay | Admitting: Family

## 2010-05-26 ENCOUNTER — Other Ambulatory Visit: Payer: Self-pay | Admitting: Hematology & Oncology

## 2010-05-26 ENCOUNTER — Encounter (HOSPITAL_BASED_OUTPATIENT_CLINIC_OR_DEPARTMENT_OTHER): Payer: Medicaid Other | Admitting: Hematology & Oncology

## 2010-05-26 DIAGNOSIS — Z7901 Long term (current) use of anticoagulants: Secondary | ICD-10-CM

## 2010-05-26 DIAGNOSIS — Z86718 Personal history of other venous thrombosis and embolism: Secondary | ICD-10-CM

## 2010-05-26 DIAGNOSIS — I2699 Other pulmonary embolism without acute cor pulmonale: Secondary | ICD-10-CM

## 2010-05-26 DIAGNOSIS — D509 Iron deficiency anemia, unspecified: Secondary | ICD-10-CM

## 2010-05-26 DIAGNOSIS — I82220 Acute embolism and thrombosis of inferior vena cava: Secondary | ICD-10-CM

## 2010-05-26 LAB — CBC WITH DIFFERENTIAL (CANCER CENTER ONLY)
BASO#: 0 10*3/uL (ref 0.0–0.2)
HCT: 35.8 % (ref 34.8–46.6)
HGB: 11.4 g/dL — ABNORMAL LOW (ref 11.6–15.9)
LYMPH#: 2 10*3/uL (ref 0.9–3.3)
MCHC: 31.8 g/dL — ABNORMAL LOW (ref 32.0–36.0)
MONO#: 0.5 10*3/uL (ref 0.1–0.9)
NEUT%: 80.1 % — ABNORMAL HIGH (ref 39.6–80.0)

## 2010-05-26 LAB — FERRITIN: Ferritin: 32 ng/mL (ref 10–291)

## 2010-06-13 NOTE — Op Note (Signed)
Virginia Fitzgerald, COLQUHOUN           ACCOUNT NO.:  [id number]   MEDICAL RECORD NO.:  [id number]          PATIENT TYPE:  INP   LOCATION:  5712                         FACILITY:  [facility]   PHYSICIAN:  Wilmon Arms. [name], M.D. DATE OF BIRTH:  Jun 15, 1985   DATE OF PROCEDURE:  10/12/2006  DATE OF DISCHARGE:                               OPERATIVE REPORT   PREOPERATIVE DIAGNOSIS:  Left thigh abscess.   POSTOPERATIVE DIAGNOSIS:  Left thigh abscess.   PROCEDURE PERFORMED:  Incision and drainage of left thigh abscess.   SURGEON:  Wilmon Arms. [name], M.D., FACS   ANESTHESIA:  General endotracheal.   INDICATIONS:  The patient is a 25 year old female who is morbidly obese,  who presents with a very large left upper thigh abscess.  This is right  at the groin.  There is a lot of surrounding inflammation and the  patient's white count is elevated.  She denies any fever.  This morning  it began draining a small amount of foul-smelling purulent drainage.   DESCRIPTION OF PROCEDURE:  The patient was brought to the operating room  and placed in the supine position on the operating room table.  After an  adequate level of general anesthesia was obtained, the patient's legs  were placed in yellow fin stirrups in the lithotomy position.  Her  perineum was exposed.  The patient had some very foul-smelling purulent  drainage from a hole, in the posterior portion of a large abscess on  upper left thigh right at the groin.  She also had some mucous drainage  from her vagina.  Her perineum and left thigh were prepped with Betadine  and draped in sterile fashion.  A time-out was taken to assure the  proper patient and proper procedure.  Cultures were taken through the  abscess opening and these were sent for microbiology.  A suction  catheter was inserted into the opening, and the abscess appeared to  track anteriorly.  There was a lot of thick induration.  I inserted a  finger into the abscess cavity and opened  the abscess cavity anteriorly.  Our incision was about 8 cm in length.  There was a lot of surrounding  induration.  I used an 18-gauge needle on a 10 mL syringe to check some  of the other areas for purulent fluid.  We also made a small counter  incision anteriorly, to check for any undrained purulent fluid  collections.  None were encountered.  We then washed out the large  abscess cavity with pulse lavage of 2 liters of normal saline.  With a  clean left glove, I examined her vagina.  The left vaginal wall did not  appear to be thick or involved in the inflammatory process.  There was  no purulent drainage coming from her vagina.  The wound was then packed  with saline-soaked Kerlix.  An ABD dressing was applied.  The patient  was extubated and brought to the recovery room in stable condition.  All  sponge, instrument, and needle counts were correct.      Wilmon Arms. Tsuei, M.D.  Electronically Signed  MKT/MEDQ  D:  10/12/2006  T:  10/13/2006  Job:  [id number]

## 2010-06-16 NOTE — Discharge Summary (Signed)
Virginia Fitzgerald, Virginia Fitzgerald           ACCOUNT NO.:  [id number]   MEDICAL RECORD NO.:  [id number]          PATIENT TYPE:  INP   LOCATION:  5712                         FACILITY:  [facility]   PHYSICIAN:  Wilmon Arms. [name], M.D. DATE OF BIRTH:  Jul 30, 1985   DATE OF ADMISSION:  10/12/2006  DATE OF DISCHARGE:  10/14/2006                               DISCHARGE SUMMARY   CHIEF COMPLAINT/REASON FOR ADMISSION:  [name] is a 25 year old,  healthy, female patient with prior perianal abscess.  She now presents  with a left upper thigh abscess for 3 days, very tender with a small  amount of purulent drainage.  This area was measured as 12 x 15 cm by  Dr. [name].  Her initial white count was 29,400.  At this point, Dr.  [name] felt that the best course for draining this abscess would be in  the operating room.   HOSPITAL COURSE:  The patient was admitted with a diagnosis of  leukocytosis and large left thigh abscess.  She was taken to the OR on  the date of admission where she underwent an I&D of the left thigh  abscess, briefly to the PACU, and then to the general floor to recover.  Over the next several days, the patient did well.  She tolerated  dressing changes.  She had transient hypokalemia treated with oral  repletion.  She was tolerating a diet and oral pain medications, and by  postoperative day #2, she had decreased pain and swelling with decreased  induration of the wound with beginning signs of granulation.  Cultures  were negative at time of discharge.  The patient was otherwise deemed  appropriate for discharge home with home health following.   FINAL DISCHARGE DIAGNOSIS:  Left thigh abscess status post incision and  drainage of abscess in the operating room.   DISCHARGE MEDICATIONS:  1. Augmentin 875 mg b.i.d.  2. Percocet 5 mg p.o. q.4-6h. p.r.n.   DIET:  No restrictions.   WOUND CARE:  Daily wet-to-dry packing to left thigh.  Home health to  assist.   ACTIVITY:  May  shower.   FOLLOW-UP:  With Dr. [name] in two to three weeks; you need to call for  an appointment.      Allison L. Rondel Jumbo. Tsuei, M.D.  Electronically Signed   ALE/MEDQ  D:  11/18/2006  T:  11/19/2006  Job:  [id number]

## 2010-07-23 ENCOUNTER — Emergency Department (HOSPITAL_COMMUNITY)
Admission: EM | Admit: 2010-07-23 | Discharge: 2010-07-23 | Disposition: A | Discharge status: Home / Self Care | Payer: Medicaid Other | Attending: Emergency Medicine | Admitting: Emergency Medicine

## 2010-07-23 DIAGNOSIS — N949 Unspecified condition associated with female genital organs and menstrual cycle: Secondary | ICD-10-CM | POA: Insufficient documentation

## 2010-07-23 DIAGNOSIS — N760 Acute vaginitis: Secondary | ICD-10-CM | POA: Insufficient documentation

## 2010-07-23 DIAGNOSIS — K6289 Other specified diseases of anus and rectum: Secondary | ICD-10-CM | POA: Insufficient documentation

## 2010-07-23 DIAGNOSIS — B9689 Other specified bacterial agents as the cause of diseases classified elsewhere: Secondary | ICD-10-CM | POA: Insufficient documentation

## 2010-07-23 DIAGNOSIS — R109 Unspecified abdominal pain: Secondary | ICD-10-CM | POA: Insufficient documentation

## 2010-07-23 DIAGNOSIS — A499 Bacterial infection, unspecified: Secondary | ICD-10-CM | POA: Insufficient documentation

## 2010-07-23 DIAGNOSIS — L293 Anogenital pruritus, unspecified: Secondary | ICD-10-CM | POA: Insufficient documentation

## 2010-07-23 DIAGNOSIS — Z975 Presence of (intrauterine) contraceptive device: Secondary | ICD-10-CM | POA: Insufficient documentation

## 2010-07-23 LAB — URINALYSIS, ROUTINE W REFLEX MICROSCOPIC
Bilirubin Urine: NEGATIVE
Glucose, UA: NEGATIVE mg/dL
Hgb urine dipstick: NEGATIVE
Specific Gravity, Urine: 1.025 (ref 1.005–1.030)
Urobilinogen, UA: 0.2 mg/dL (ref 0.0–1.0)

## 2010-07-23 LAB — WET PREP, GENITAL
Trich, Wet Prep: NONE SEEN
Yeast Wet Prep HPF POC: NONE SEEN

## 2010-07-23 LAB — URINE MICROSCOPIC-ADD ON

## 2010-07-25 LAB — URINE CULTURE
Colony Count: >=100000
Culture  Setup Time: 201206242032

## 2010-09-03 ENCOUNTER — Emergency Department (HOSPITAL_COMMUNITY): Payer: Medicaid Other

## 2010-09-03 ENCOUNTER — Inpatient Hospital Stay (HOSPITAL_COMMUNITY)
Admission: EM | Admit: 2010-09-03 | Discharge: 2010-09-08 | DRG: 176 | Disposition: A | Discharge status: Home / Self Care | Payer: Medicaid Other | Attending: Family Medicine | Admitting: Family Medicine

## 2010-09-03 DIAGNOSIS — G43909 Migraine, unspecified, not intractable, without status migrainosus: Secondary | ICD-10-CM | POA: Diagnosis present

## 2010-09-03 DIAGNOSIS — D72829 Elevated white blood cell count, unspecified: Secondary | ICD-10-CM | POA: Diagnosis present

## 2010-09-03 DIAGNOSIS — I2699 Other pulmonary embolism without acute cor pulmonale: Principal | ICD-10-CM | POA: Diagnosis present

## 2010-09-03 DIAGNOSIS — K5900 Constipation, unspecified: Secondary | ICD-10-CM | POA: Diagnosis present

## 2010-09-03 DIAGNOSIS — N760 Acute vaginitis: Secondary | ICD-10-CM | POA: Diagnosis present

## 2010-09-03 DIAGNOSIS — B9689 Other specified bacterial agents as the cause of diseases classified elsewhere: Secondary | ICD-10-CM | POA: Diagnosis present

## 2010-09-03 DIAGNOSIS — A499 Bacterial infection, unspecified: Secondary | ICD-10-CM | POA: Diagnosis present

## 2010-09-03 DIAGNOSIS — D509 Iron deficiency anemia, unspecified: Secondary | ICD-10-CM | POA: Diagnosis present

## 2010-09-03 HISTORY — DX: Anemia, unspecified: D64.9

## 2010-09-03 HISTORY — DX: Thrombocytosis, unspecified: D75.839

## 2010-09-03 HISTORY — DX: Essential (hemorrhagic) thrombocythemia: D47.3

## 2010-09-03 HISTORY — DX: Presence of other vascular implants and grafts: Z95.828

## 2010-09-03 HISTORY — DX: Personal history of other venous thrombosis and embolism: Z86.718

## 2010-09-04 ENCOUNTER — Inpatient Hospital Stay (HOSPITAL_COMMUNITY): Payer: Medicaid Other

## 2010-09-04 ENCOUNTER — Emergency Department (HOSPITAL_COMMUNITY): Payer: Medicaid Other

## 2010-09-04 LAB — CK TOTAL AND CKMB (NOT AT ARMC)
CK, MB: 2.1 ng/mL (ref 0.3–4.0)
CK, MB: 2 ng/mL (ref 0.3–4.0)
Relative Index: 1.8 (ref 0.0–2.5)
Total CK: 112 U/L (ref 7–177)
Total CK: 84 U/L (ref 7–177)

## 2010-09-04 LAB — CBC
Hemoglobin: 13.1 g/dL (ref 12.0–15.0)
MCHC: 33.4 g/dL (ref 30.0–36.0)
MCV: 78 fL (ref 78.0–100.0)
Platelets: 354 10*3/uL (ref 150–400)
RBC: 4.99 MIL/uL (ref 3.87–5.11)
RBC: 5.58 MIL/uL — ABNORMAL HIGH (ref 3.87–5.11)
WBC: 10.5 10*3/uL (ref 4.0–10.5)

## 2010-09-04 LAB — POCT PREGNANCY, URINE: Preg Test, Ur: NEGATIVE

## 2010-09-04 LAB — DIFFERENTIAL
Basophils Absolute: 0 10*3/uL (ref 0.0–0.1)
Basophils Relative: 0 % (ref 0–1)
Monocytes Absolute: 0.5 10*3/uL (ref 0.1–1.0)
Neutro Abs: 8.6 10*3/uL — ABNORMAL HIGH (ref 1.7–7.7)
Neutrophils Relative %: 68 % (ref 43–77)

## 2010-09-04 LAB — BASIC METABOLIC PANEL
Calcium: 9.5 mg/dL (ref 8.4–10.5)
GFR calc Af Amer: >60 mL/min (ref >60)
GFR calc non Af Amer: >60 mL/min (ref >60)
Glucose, Bld: 200 mg/dL — ABNORMAL HIGH (ref 70–99)
Potassium: 3.5 mEq/L (ref 3.5–5.1)
Sodium: 138 mEq/L (ref 135–145)

## 2010-09-04 LAB — PROTIME-INR: Prothrombin Time: 13.6 seconds (ref 11.6–15.2)

## 2010-09-04 LAB — POCT I-STAT, CHEM 8
HCT: 43 % (ref 36.0–46.0)
Hemoglobin: 14.6 g/dL (ref 12.0–15.0)
Sodium: 141 mEq/L (ref 135–145)
TCO2: 27 mmol/L (ref 0–100)

## 2010-09-04 LAB — MRSA PCR SCREENING: MRSA by PCR: NEGATIVE

## 2010-09-04 MED ORDER — IOHEXOL 300 MG/ML  SOLN
100.0000 mL | Freq: Once | INTRAMUSCULAR | Status: AC | PRN
  Administered 2010-09-04: 100 mL via INTRAVENOUS

## 2010-09-04 MED ORDER — IOHEXOL 350 MG/ML SOLN
100.0000 mL | Freq: Once | INTRAVENOUS | Status: AC | PRN
  Administered 2010-09-04: 100 mL via INTRAVENOUS

## 2010-09-05 ENCOUNTER — Inpatient Hospital Stay (HOSPITAL_COMMUNITY): Payer: Medicaid Other

## 2010-09-05 ENCOUNTER — Encounter (HOSPITAL_COMMUNITY): Payer: Self-pay | Admitting: Radiology

## 2010-09-05 DIAGNOSIS — R0602 Shortness of breath: Secondary | ICD-10-CM

## 2010-09-05 DIAGNOSIS — I2699 Other pulmonary embolism without acute cor pulmonale: Secondary | ICD-10-CM

## 2010-09-05 DIAGNOSIS — I82409 Acute embolism and thrombosis of unspecified deep veins of unspecified lower extremity: Secondary | ICD-10-CM

## 2010-09-05 LAB — BASIC METABOLIC PANEL
BUN: 9 mg/dL (ref 6–23)
Chloride: 105 mEq/L (ref 96–112)
Creatinine, Ser: 0.61 mg/dL (ref 0.50–1.10)
GFR calc Af Amer: >60 mL/min (ref >60)
Glucose, Bld: 116 mg/dL — ABNORMAL HIGH (ref 70–99)

## 2010-09-05 LAB — URINALYSIS, MICROSCOPIC ONLY
Nitrite: NEGATIVE
Specific Gravity, Urine: 1.014 (ref 1.005–1.030)
Urobilinogen, UA: 0.2 mg/dL (ref 0.0–1.0)
pH: 7 (ref 5.0–8.0)

## 2010-09-05 LAB — LUPUS ANTICOAGULANT PANEL
DRVVT: 49.6 secs — ABNORMAL HIGH (ref 36.2–44.3)
Lupus Anticoagulant: NOT DETECTED
dRVVT Incubated 1:1 Mix: 38.5 secs (ref 36.2–44.3)

## 2010-09-05 LAB — CBC
HCT: 37.5 % (ref 36.0–46.0)
Hemoglobin: 12.1 g/dL (ref 12.0–15.0)
MCHC: 32.3 g/dL (ref 30.0–36.0)
MCV: 78.8 fL (ref 78.0–100.0)
RBC: 4.76 MIL/uL (ref 3.87–5.11)
RDW: 17.2 % — ABNORMAL HIGH (ref 11.5–15.5)

## 2010-09-05 LAB — ANTITHROMBIN III: AntiThromb III Func: 110 % (ref 76–126)

## 2010-09-05 LAB — PROTEIN C ACTIVITY: Protein C Activity: 155 % — ABNORMAL HIGH (ref 75–133)

## 2010-09-06 ENCOUNTER — Inpatient Hospital Stay (HOSPITAL_COMMUNITY): Payer: Medicaid Other

## 2010-09-06 LAB — PROTEIN S, TOTAL: Protein S Ag, Total: 120 % (ref 60–150)

## 2010-09-06 LAB — BASIC METABOLIC PANEL
BUN: 10 mg/dL (ref 6–23)
Calcium: 9 mg/dL (ref 8.4–10.5)
GFR calc Af Amer: >60 mL/min (ref >60)
GFR calc non Af Amer: >60 mL/min (ref >60)
Glucose, Bld: 99 mg/dL (ref 70–99)
Potassium: 4 mEq/L (ref 3.5–5.1)
Sodium: 136 mEq/L (ref 135–145)

## 2010-09-06 LAB — DIFFERENTIAL
Basophils Absolute: 0 10*3/uL (ref 0.0–0.1)
Eosinophils Absolute: 0.1 10*3/uL (ref 0.0–0.7)
Eosinophils Relative: 1 % (ref 0–5)
Monocytes Absolute: 0.6 10*3/uL (ref 0.1–1.0)

## 2010-09-06 LAB — CBC
MCH: 25.6 pg — ABNORMAL LOW (ref 26.0–34.0)
MCHC: 32.2 g/dL (ref 30.0–36.0)
RDW: 17.4 % — ABNORMAL HIGH (ref 11.5–15.5)

## 2010-09-06 LAB — URINE CULTURE
Colony Count: 60000
Culture  Setup Time: 201208080254

## 2010-09-06 LAB — FACTOR 5 LEIDEN

## 2010-09-07 LAB — CBC
MCV: 79.5 fL (ref 78.0–100.0)
Platelets: 315 10*3/uL (ref 150–400)
RBC: 4.93 MIL/uL (ref 3.87–5.11)
RDW: 16.9 % — ABNORMAL HIGH (ref 11.5–15.5)
WBC: 14.3 10*3/uL — ABNORMAL HIGH (ref 4.0–10.5)

## 2010-09-08 LAB — CBC
HCT: 40.2 % (ref 36.0–46.0)
Hemoglobin: 13.6 g/dL (ref 12.0–15.0)
MCV: 79.1 fL (ref 78.0–100.0)
RBC: 5.08 MIL/uL (ref 3.87–5.11)
RDW: 16.7 % — ABNORMAL HIGH (ref 11.5–15.5)
WBC: 13.3 10*3/uL — ABNORMAL HIGH (ref 4.0–10.5)

## 2010-09-12 LAB — CULTURE, BLOOD (ROUTINE X 2)
Culture  Setup Time: 201208080205
Culture: NO GROWTH

## 2010-09-12 LAB — CARDIOLIPIN ANTIBODIES, IGG, IGM, IGA: Anticardiolipin IgA: 6 APL U/mL — ABNORMAL LOW (ref <22)

## 2010-09-12 LAB — BETA-2-GLYCOPROTEIN I ABS, IGG/M/A: Beta-2-Glycoprotein I IgM: 7 M Units (ref <20)

## 2010-09-18 NOTE — Discharge Summary (Signed)
NAMEDELPHINA, SCHUM           ACCOUNT NO.:  [id number]  MEDICAL RECORD NO.:  [id number]  LOCATION:                                 FACILITY:  PHYSICIAN:  Pleas Koch, MD        DATE OF BIRTH:  [birth year]  DATE OF ADMISSION:  09/05/2010 DATE OF DISCHARGE:  09/08/2010                              DISCHARGE SUMMARY   DISCHARGE DIAGNOSES: 1. 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 2. Leukocytosis likely secondary to bacterial vaginosis. 3. Questionable history of urinary tract infection treated for 2 days     with Rocephin and discontinued. 4. History of intrauterine device Mirena, which needs removal as     slight risk for recurrent stroke. 5. Headache with cause undefined, but likely secondary to not wearing     her glasses as she is supposed to. 6. Abdominal discomfort likely secondary to bacterial vaginosis. 7. Nausea/vomiting secondary to possible bacterial vaginosis. 8. Peripheral vertigo versus not being able to see well with her     glasses.  DISCHARGE MEDICATIONS: 1. Arixtra 10 mg q.24 hourly. 2. Meclizine 25 mg p.o. b.i.d. 3. Hydrocodone/APAP 5/325 one tablet q.6 p.r.n. for duration of 5     days, quantity 20. 4. Clindamycin 300 mg b.i.d. to complete course of bacterial vaginosis     of five more days 10 tablets prescribed.  PERTINENT IMAGING STUDIES:  MR venogram of head showed, 1. 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     a.     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     b.     Midline left jugular sinus patent.  MRA of  head, September 07, 2010, showed normal MRI.  __________ evidence of proximal stenosis      aneurysm __________ occlusion.  Subsequent tortuosity of cervical      right internal carotid artery.  CT of  head, September 05, 2010 negative.  CT of abdomen and pelvis, September 04, 2010, showed no acute abdominopelvic findings, mass, lesion, or lymphadenopathy.  CT angiogram of chest, September 04, 2010, showed segmental thrombus and left lower lobe pulmonary artery and multiple small __________ given prior studies.  PERTINENT CONSULTS:  Dr. Myna Hidalgo of Hem/Onc.  BRIEFLY:  This is a 25 year old female, who presented with chest pain and shortness of breath of 1 week that got progressively worse.  She is on oxygen chronically at home and is supposed to use her BiPAP machine. She felt warm.  Had no fever, chills, or rigors.  No recent history of travel.  She has history indicative of recurrent iliofemoral thrombus in the setting of supratherapeutic INR while on anticoagulation with Coumadin last year and was seen by Dr. Myna Hidalgo who recommended Arixtra when she failed Coumadin.  She was __________ April this year and then was found to have a segmental PE.  This patient also complained of significant headache and head workup was done of CT  of the brain, MRI/MRA.  A 2-D echo was performed as well, September 04, 2010 showing EF 60% to 65% and no foramen ovale.  HOSPITAL COURSE: 1. PE.  The patient was placed on Arixtra, admitted to Stepdown     overnight in view of severity of problems and now is transitioned     to the floor.  She was given a script for Arixtra.  Hypercoagulable     workup was done and it was noted that the patient was on Mirena,     which has a small __________ risk of thromboembolism, hence it is     recommended that she finds another method of contraception.  It     would be recommend that she follow up with OB/GYN for this. 2. Bacterial vaginosis.  The patient had some leukocytosis, which was     unexplained and it was noted that she had vaginal discharge.  She     also had a possible UTI, but urine culture returned, September 05, 2010     which showed multiple bacterial phenotypes in only  60,000 colonies.     Leukocytosis improved until day of discharge 15.3.  The patient was     encouraged to finish whole course of clindamycin.  The patient was     initially on Flagyl, but this made her nauseous. 3. Dizziness, nausea, and vomiting, unclear etiology.  It was thought     that she had some peripheral vertigo as my screening of her did     show she had nystagmus to the left.  However, physical therapy and     occupational therapy were not thought this was in case, but     mentioned that she does not wear glasses and is supposed to.  I     will get social work involved to help figure out how this should be     coordinated. 4. Abdominal pain, resolved. 5. Contraception.  The patient will need another form of     contraception.  I would recommend condoms in the outpatient     setting.  The patient cannot take hormone or contraceptives,     alternatively a copper IUD may be placed as this does not contain     hormones and is almost as effective.  I will leave this up to our     OB/GYN colleague.  The patient was seen on day of discharge, was doing well, had no further issues.  Vitals were stable.  Temperature 98.6, pulse 88 to 112, respirations 20, blood pressure 111-125/60-76, satting 98% on room air. She still had some shortness of breath, which will be expected with having a PE and will go home on home oxygen and she is to use her BiPAP at night.  Social worker is arranged.          ______________________________ Pleas Koch, MD     JS/MEDQ  D:  09/08/2010  T:  09/08/2010  Job:  [id number]  cc:   Josph Macho, M.D.  Electronically Signed by Pleas Koch MD on 09/18/2010 07:55:54 AM

## 2010-09-18 NOTE — Consult Note (Signed)
NAMEJENNALEE, Fitzgerald NO.:  [id number]  MEDICAL RECORD NO.:  [id number]  LOCATION:  2605                         FACILITY:  [facility]  PHYSICIAN:  Josph Macho, M.D.  DATE OF BIRTH:  January 21, 1986  DATE OF CONSULTATION:  09/05/2010 DATE OF DISCHARGE:                                CONSULTATION   REFERRING PHYSICIAN:  Hartley Barefoot, MD  REASON FOR CONSULTATION:  Recurrent pulmonary embolism.  HISTORY OF PRESENT ILLNESS:  [name] is a very nice 25 year old [name] who is fairly well known to me.  I initially saw her in consultation back in March 2011.  At that point in time, she presented with a right iliofemoral thrombus.  She has been on Coumadin because of a postpartum pregnancy.  She has 2 kids.  She did not have any issues with respect to her pregnancies, but did develop a PE after her second pregnancy.  She subsequently was put on heparin and Coumadin. She then was readmitted as she was found to have a thrombus in the IVC and left common iliac vein.  A hypercoagulable panel done at that time showed a positive lupus anticoagulant.  This has been repeated I think in our office and has been negative.  All other hypercoagulable studies have also been negative.  Antiphospholipid antibody tests were all negative.  She did undergo thrombolytic therapy which was successful.  She was on Arixtra as an outpatient.  She stopped this after a year.  I felt that a year of Arixtra certainly was appropriate for her.  She was on aspirin as an outpatient.  About 2 weeks before admission, she began to have problems with some chest discomfort and progressive shortness of breath.  She came to the emergency room.  She was found to have a segmental thrombus in the left lower pulmonary arteries and smaller branches.  The radiologist felt this "possibly" could be chronic in appearance.  Nothing else was noted that was suspicious.  CT of the abdomen and  pelvis was negative.  She has gotten iron in the office because of anemia secondary to menometrorrhagia.  When she came in, her hemoglobin was 14.5, hematocrit was 43.5 with MCV of 78.  Platelet count was 354.  She is now placed back on Arixtra.  We are asked to see her to try to help with further management.  Of note, she does have Mirena IUD.  I felt that this was of low risk for hypercoagulability.  She is very diligent with taking care for herself.  She does not smoke. She had been pretty active with her 2 young kids.  Her past medical history is relatively unrevealing.  Her allergies are none.  Her admission medications were none, although she reports that she was taking aspirin.  FAMILY HISTORY:  Remarkable for history of thrombus in her maternal grandmother.  There is a history of DVT in an aunt and a niece.  SOCIAL HISTORY:  Negative for tobacco use.  There is no alcohol use.  PHYSICAL EXAMINATION:  GENERAL:  This is a somewhat obese black female in no obvious distress.  She is alert and oriented x3. VITAL SIGNS:  Temperature of 98, pulse is  82, respiratory rate 18, blood pressure is 130/80, and oxygen saturation is 97%. HEAD AND NECK:  Normocephalic and atraumatic skull.  There are no ocular or oral lesions.  There are no palpable cervical or supraclavicular lymph nodes. LUNGS:  Clear bilaterally with no rales, wheezes, or rhonchi. CARDIAC:  Regular rate and rhythm with normal S1 and S2.  There are no murmurs, rubs, or bruits. ABDOMEN:  Obese, but soft.  She has good bowel sounds.  There is no abdominal mass.  There is no fluid wave.  There is no palpable hepatosplenomegaly. EXTREMITIES:  No clubbing, cyanosis, or edema. NEUROLOGICAL:  No focal neurological deficit.  IMPRESSION:  [name] is a 25 year old [name] with recurrent venous thromboembolism.  She has a pulmonary embolism. Again, by the report from the radiologist, it was very hard  to say whether this is acute or not.  Clinically, one will have to suspect that she does indeed have a new embolus.  It is hard to say whether the Mirena IUD is extremely factor.  Studies have reported a low risk of VTE with Mirena.  However, given the fact that Ms. Kil does not have any obvious identifiable hypercoagulable etiology, I think that the Mirena IUD might be a factor and will definitely need to be removed.  As far as we want anticoagulation, I suspect that she will need lifelong anticoagulation.  She did very well with Arixtra as an outpatient.  She was quite diligent in taking this daily.  She is very motivated to prevent this from happening again.  As such, I would put her on Arixtra as an outpatient.  I do not see a problem with doing Dopplers of her legs.  I suspect they are probably negative.  On her physical exam, I certainly did not see anything that looked suspicious with respect to her legs.  Again, she is going to need lifelong anticoagulation.  I suspect that at some point, we will probably put her one of the new oral agents as they have been shown to be as effective as Coumadin with actual less toxicity (despite with you see on TB) to make life easier for Ms. Nyce.     Josph Macho, M.D.     PRE/MEDQ  D:  09/05/2010  T:  09/05/2010  Job:  [id number]  Electronically Signed by Arlan Organ  on 09/18/2010 07:22:04 AM

## 2010-09-28 ENCOUNTER — Encounter (HOSPITAL_BASED_OUTPATIENT_CLINIC_OR_DEPARTMENT_OTHER): Payer: Medicaid Other | Admitting: Hematology & Oncology

## 2010-09-28 ENCOUNTER — Other Ambulatory Visit: Payer: Self-pay | Admitting: Hematology & Oncology

## 2010-09-28 DIAGNOSIS — I2699 Other pulmonary embolism without acute cor pulmonale: Secondary | ICD-10-CM

## 2010-09-28 DIAGNOSIS — D509 Iron deficiency anemia, unspecified: Secondary | ICD-10-CM

## 2010-09-28 DIAGNOSIS — Z7901 Long term (current) use of anticoagulants: Secondary | ICD-10-CM

## 2010-09-28 DIAGNOSIS — Z86718 Personal history of other venous thrombosis and embolism: Secondary | ICD-10-CM

## 2010-09-28 LAB — IRON AND TIBC
%SAT: 8 % — ABNORMAL LOW (ref 20–55)
Iron: 23 ug/dL — ABNORMAL LOW (ref 42–145)
TIBC: 284 ug/dL (ref 250–470)
UIBC: 261 ug/dL (ref 125–400)

## 2010-09-28 LAB — CBC WITH DIFFERENTIAL (CANCER CENTER ONLY)
BASO#: 0 10*3/uL (ref 0.0–0.2)
BASO%: 0.3 % (ref 0.0–2.0)
EOS%: 1.5 % (ref 0.0–7.0)
Eosinophils Absolute: 0.2 10*3/uL (ref 0.0–0.5)
HCT: 39 % (ref 34.8–46.6)
HGB: 13.5 g/dL (ref 11.6–15.9)
LYMPH#: 2.5 10*3/uL (ref 0.9–3.3)
LYMPH%: 21.5 % (ref 14.0–48.0)
MCH: 27.3 pg (ref 26.0–34.0)
MCHC: 34.6 g/dL (ref 32.0–36.0)
MCV: 79 fL — ABNORMAL LOW (ref 81–101)
MONO#: 0.8 10*3/uL (ref 0.1–0.9)
MONO%: 6.6 % (ref 0.0–13.0)
NEUT#: 8.1 10*3/uL — ABNORMAL HIGH (ref 1.5–6.5)
NEUT%: 70.1 % (ref 39.6–80.0)
Platelets: 345 10*3/uL (ref 145–400)
RBC: 4.95 10*6/uL (ref 3.70–5.32)
RDW: 14.5 % (ref 11.1–15.7)
WBC: 11.5 10*3/uL — ABNORMAL HIGH (ref 3.9–10.0)

## 2010-09-28 LAB — D-DIMER, QUANTITATIVE: D-Dimer, Quant: 0.23 ug/mL-FEU (ref 0.00–0.48)

## 2010-09-28 LAB — RETICULOCYTES (CHCC)
ABS Retic: 83.3 10*3/uL (ref 19.0–186.0)
RBC.: 4.9 MIL/uL (ref 3.87–5.11)
Retic Ct Pct: 1.7 % (ref 0.4–2.3)

## 2010-09-28 LAB — FERRITIN: Ferritin: 107 ng/mL (ref 10–291)

## 2010-09-28 LAB — CHCC SATELLITE - SMEAR

## 2010-10-02 NOTE — H&P (Signed)
Virginia Fitzgerald, Virginia Fitzgerald           ACCOUNT NO.:  [id number]  MEDICAL RECORD NO.:  [id number]  LOCATION:  [facility]                         FACILITY:  [facility]  PHYSICIAN:  Richarda Overlie, MD       DATE OF BIRTH:  [birth year]  DATE OF ADMISSION:  09/03/2010 DATE OF DISCHARGE:                             HISTORY & PHYSICAL   PRIMARY CARE PHYSICIAN:  Unassigned.  CHIEF COMPLAINT:  Chest pain.  SUBJECTIVE:  This is a 25 year old female who presents to the ER with a chief complaint of chest pain and shortness of breath for about 1 week which has progressively gotten worse.  She has also had a nonproductive cough.  She has felt warm, but she has had no fever, chills, or rigors. No recent history of travel.  She is a nonsmoker.  She has a history which is complicated by recurrent iliofemoral thrombus in the setting of supratherapeutic INR, while on anticoagulation with Coumadin last year. She was seen by Dr. Arlan Organ who recommended that she be on Arixtra since she had failed Coumadin.  The patient has been on Arixtra up until April of this year and she discontinued Arixtra after one year Hematology recommendation.  The patient is found to have a segmental PE today.  PAST MEDICAL HISTORY: 1. 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 2. History of thrombocytosis secondary to iron-deficiency anemia. 3. Iron-deficiency anemia.  ALLERGIES:  None.  CURRENT MEDICATIONS:  None.  SOCIAL HISTORY:  Negative for alcohol use and tobacco use.  FAMILY HISTORY:  Remarkable for history of thrombus in maternal grandmother.  History of DVT in an aunt and in a niece.  History of breast cancer in a maternal cousin.  REVIEW OF SYSTEMS:  Complete review of systems was done as documented in HPI.  PHYSICAL EXAMINATION:  VITAL SIGNS:  Blood pressure 136/81, pulse of 85, respirations 16, and temperature 97.5. GENERAL:  Comfortable in  no acute cardiopulmonary distress. HEENT:  Pupils equal and reactive.  Extraocular movements intact. NECK:  Supple.  No JVD. LUNGS:  Clear to auscultation bilaterally.  No wheezes, crackles, or rhonchi. CARDIOVASCULAR:  Regular rate and rhythm.  No murmurs, rubs, or gallops. ABDOMEN:  Obese, soft, nontender, and nondistended. EXTREMITIES:  Without cyanosis, clubbing, or edema. NEUROLOGIC:  Cranial nerves II through XII grossly intact. PSYCHIATRIC:  Appropriate mood and affect.  LABORATORY DATA:  INR 1.02.  Urine pregnancy test is negative.  CBC; WBC 12.7, hemoglobin 13.1, hematocrit 39.2, and platelet count of 368. Sodium 141, potassium 3.6, chloride 101, glucose 100, BUN 8, and creatinine 0.8.  CT angio of the chest shows segmental thrombus in the left lower lobe in multiple smaller branches.  PLAN:  The patient will be started on anticoagulation with Arixtra.  The pulmonary embolism in the segmental branches appears to be acute and in the smaller branches appears to be chronic compared to the prior study. However, given her cardiopulmonary symptoms, I think the patient should definitely be restarted on anticoagulation and she will be initiated on Arixtra.  Her radiologic findings must be discussed in the morning with the radiologist to see how  much of this is acute and how much of this is chronic.  The patient is also complaining of a headache.  We will start a headache workup with the CT of the brain without contrast to rule out an infarct.  If her CT is negative, then we should consider an MRA and MRV of the brain to rule out cerebral vein thrombosis and arterial thrombosis.  We will also order a 2-D echo with bubble study to rule out patent foramen ovale.  Hematology consultation may also be considered in the morning given the fact that the patient comes in with recurrent PE in the setting of discontinuation of Arixtra .  She is a full code.     Richarda Overlie,  MD     NA/MEDQ  D:  09/04/2010  T:  09/04/2010  Job:  [id number]  Electronically Signed by Richarda Overlie MD on 10/02/2010 07:54:27 PM

## 2010-10-23 LAB — POCT URINALYSIS DIP (DEVICE)
Ketones, ur: 15 — AB
Specific Gravity, Urine: 1.02
pH: 6

## 2010-10-23 LAB — POCT I-STAT CREATININE: Creatinine, Ser: 1

## 2010-10-23 LAB — I-STAT 8, (EC8 V) (CONVERTED LAB)
BUN: 6
Chloride: 105
HCT: 44
Hemoglobin: 15
Operator id: 116391
Sodium: 139

## 2010-10-23 LAB — POCT PREGNANCY, URINE: Preg Test, Ur: NEGATIVE

## 2010-10-24 LAB — WET PREP, GENITAL
Trich, Wet Prep: NONE SEEN
Yeast Wet Prep HPF POC: NONE SEEN

## 2010-10-24 LAB — POCT URINALYSIS DIP (DEVICE)
Operator id: 235561
Protein, ur: NEGATIVE
Specific Gravity, Urine: 1.015
Urobilinogen, UA: 0.2
pH: 7

## 2010-10-24 LAB — POCT PREGNANCY, URINE: Operator id: 235561

## 2010-10-24 LAB — GC/CHLAMYDIA PROBE AMP, GENITAL: GC Probe Amp, Genital: POSITIVE — AB

## 2010-10-26 LAB — URINALYSIS, ROUTINE W REFLEX MICROSCOPIC
Glucose, UA: NEGATIVE
Nitrite: NEGATIVE
pH: 6.5

## 2010-10-26 LAB — POCT PREGNANCY, URINE: Operator id: 277751

## 2010-10-26 LAB — URINE MICROSCOPIC-ADD ON

## 2010-10-27 LAB — WET PREP, GENITAL: Trich, Wet Prep: NONE SEEN

## 2010-10-27 LAB — HCG, QUANTITATIVE, PREGNANCY: hCG, Beta Chain, Quant, S: 12429 — ABNORMAL HIGH

## 2010-10-27 LAB — CBC
HCT: 35.5 — ABNORMAL LOW
MCV: 75.3 — ABNORMAL LOW
Platelets: 392
RBC: 4.72
WBC: 12.9 — ABNORMAL HIGH

## 2010-11-10 LAB — CBC
HCT: 32.5 — ABNORMAL LOW
HCT: 37
Hemoglobin: 10.5 — ABNORMAL LOW
Hemoglobin: 12
MCHC: 32.4
MCV: 74 — ABNORMAL LOW
Platelets: 388
RBC: 4.36
RDW: 16.7 — ABNORMAL HIGH
WBC: 29.4 — ABNORMAL HIGH

## 2010-11-10 LAB — CULTURE, ROUTINE-ABSCESS

## 2010-11-10 LAB — BASIC METABOLIC PANEL
BUN: 6
CO2: 25
Calcium: 8.2 — ABNORMAL LOW
Chloride: 103
Chloride: 105
GFR calc Af Amer: >60
GFR calc non Af Amer: >60
Glucose, Bld: 85
Potassium: 3.2 — ABNORMAL LOW
Potassium: 3.5
Sodium: 136
Sodium: 139

## 2010-11-10 LAB — DIFFERENTIAL
Basophils Absolute: 0.3 — ABNORMAL HIGH
Basophils Relative: 1
Eosinophils Relative: 0
Lymphocytes Relative: 6 — ABNORMAL LOW
Lymphs Abs: 1.8
Monocytes Relative: 6
Neutro Abs: 25.5 — ABNORMAL HIGH

## 2010-11-10 LAB — ANAEROBIC CULTURE

## 2010-11-10 LAB — HEMOGLOBIN A1C: Mean Plasma Glucose: 129

## 2010-12-07 ENCOUNTER — Telehealth: Payer: Self-pay | Admitting: Hematology & Oncology

## 2010-12-07 NOTE — Telephone Encounter (Signed)
Pt called scheduled 12-19 appointment

## 2010-12-29 ENCOUNTER — Telehealth: Payer: Self-pay | Admitting: *Deleted

## 2010-12-29 NOTE — Telephone Encounter (Signed)
Pt left message on voicemail with a question regarding her Arixtra. The message was very muffled therefore the actual question was unable to be heard. Returned call..no answer and unable to leave a message.

## 2010-12-30 ENCOUNTER — Encounter (HOSPITAL_COMMUNITY): Payer: Self-pay | Admitting: Emergency Medicine

## 2010-12-30 ENCOUNTER — Emergency Department (HOSPITAL_COMMUNITY)
Admission: EM | Admit: 2010-12-30 | Discharge: 2010-12-30 | Disposition: A | Discharge status: Home / Self Care | Payer: Medicaid Other | Attending: Emergency Medicine | Admitting: Emergency Medicine

## 2010-12-30 DIAGNOSIS — N390 Urinary tract infection, site not specified: Secondary | ICD-10-CM | POA: Insufficient documentation

## 2010-12-30 DIAGNOSIS — N949 Unspecified condition associated with female genital organs and menstrual cycle: Secondary | ICD-10-CM | POA: Insufficient documentation

## 2010-12-30 DIAGNOSIS — R10819 Abdominal tenderness, unspecified site: Secondary | ICD-10-CM | POA: Insufficient documentation

## 2010-12-30 LAB — URINE MICROSCOPIC-ADD ON

## 2010-12-30 LAB — URINALYSIS, ROUTINE W REFLEX MICROSCOPIC
Bilirubin Urine: NEGATIVE
Glucose, UA: NEGATIVE mg/dL
Nitrite: POSITIVE — AB
Specific Gravity, Urine: 1.027 (ref 1.005–1.030)
pH: 7 (ref 5.0–8.0)

## 2010-12-30 LAB — WET PREP, GENITAL: Yeast Wet Prep HPF POC: NONE SEEN

## 2010-12-30 MED ORDER — NITROFURANTOIN MONOHYD MACRO 100 MG PO CAPS: 100.0000 mg | ORAL_CAPSULE | Freq: Two times a day (BID) | ORAL | Status: DC

## 2010-12-30 MED ORDER — NITROFURANTOIN MONOHYD MACRO 100 MG PO CAPS: 100.0000 mg | ORAL_CAPSULE | Freq: Two times a day (BID) | ORAL | Status: AC

## 2010-12-30 MED ORDER — HYDROCODONE-ACETAMINOPHEN 5-325 MG PO TABS
1.0000 | ORAL_TABLET | Freq: Once | ORAL | Status: AC
  Administered 2010-12-30: 1 via ORAL
  Filled 2010-12-30: qty 1

## 2010-12-30 MED ORDER — HYDROCODONE-ACETAMINOPHEN 5-325 MG PO TABS: 1.0000 | ORAL_TABLET | ORAL | Status: AC | PRN

## 2010-12-30 NOTE — ED Notes (Signed)
WUJ:[id number]<JY> Expected date:12/30/10<BR> Expected time: 5:33 PM<BR> Means of arrival:Ambulance<BR> Comments:<BR> EMS 80 GC, 25 yof abd. Pain Attempted encode, messaged [facility]

## 2010-12-30 NOTE — ED Notes (Addendum)
Per EMS, pt began having sharp abdominal pain after having bowel movement this afternoon, "sudden sharp pain", denies n/v/constipation

## 2010-12-30 NOTE — ED Provider Notes (Signed)
History     CSN: [id number] Arrival date & time: 12/30/2010  6:00 PM   First MD Initiated Contact with Patient 12/30/10 1815      Chief Complaint  Patient presents with  . Abdominal Pain    (Consider location/radiation/quality/duration/timing/severity/associated sxs/prior treatment) HPI Pt had acute onset sharp, crampy and severe pelvic pain just pta.  Occurred immediately after urinating. No associated fever, N/V/D or other GU sx.  Aggravated by urination but otherwise no urinary sx.  Had similar pain last year during pregnancy when she was having contractions and has had less severe but similar pain w/ past UTIs as well.   Past Medical History  Diagnosis Date  . Anemia   . Thrombocytosis   . S/P IVC filter   . History of DVT of lower extremity     History reviewed. No pertinent past surgical history.  History reviewed. No pertinent family history.  History  Substance Use Topics  . Smoking status: Not on file  . Smokeless tobacco: Not on file  . Alcohol Use: Not on file    OB History    Grav Para Term Preterm Abortions TAB SAB Ect Mult Living                  Review of Systems  All other systems reviewed and are negative.    Allergies  Review of patient's allergies indicates no known allergies.  Home Medications   Current Outpatient Rx  Name Route Sig Dispense Refill  . FONDAPARINUX SODIUM 10 MG/0.8ML [place] SOLN Subcutaneous Inject 10 mg into the skin daily.        BP 120/90  Pulse 102  Temp(Src) 97.5 F (36.4 C) (Oral)  Resp 16  Ht 5\' 2"  (1.575 m)  SpO2 98%  Physical Exam  Nursing note and vitals reviewed. Constitutional: She is oriented to person, place, and time. She appears well-developed and well-nourished. No distress.  HENT:  Head: Normocephalic and atraumatic.  Eyes:       Normal appearance  Neck: Normal range of motion.  Cardiovascular: Normal rate and regular rhythm.   Pulmonary/Chest: Effort normal and breath sounds normal.    Abdominal: Soft. Bowel sounds are normal. She exhibits no distension and no mass. There is no tenderness. There is no rebound and no guarding.       Tenderness right suprapubic.  No CVA tenderness  Genitourinary:       Nml external genitalia.  No vaginal discharge/bleeding.  Cervix closed and appears nml.  No adnexal or cervical motion tenderess.    Neurological: She is alert and oriented to person, place, and time.  Skin: Skin is warm and dry. No rash noted.  Psychiatric: She has a normal mood and affect. Her behavior is normal.    ED Course  Procedures (including critical care time)  Labs Reviewed  URINALYSIS, ROUTINE W REFLEX MICROSCOPIC - Abnormal; Notable for the following:    APPearance CLOUDY (*)    Nitrite POSITIVE (*)    Leukocytes, UA MODERATE (*)    All other components within normal limits  WET PREP, GENITAL - Abnormal; Notable for the following:    Trich, Wet Prep FEW (*)    Clue Cells, Wet Prep FEW (*)    WBC, Wet Prep HPF POC FEW (*)    All other components within normal limits  URINE MICROSCOPIC-ADD ON - Abnormal; Notable for the following:    Bacteria, UA MANY (*)    All other components within normal limits  PREGNANCY,  URINE  GC/CHLAMYDIA PROBE AMP, GENITAL   No results found.   1. UTI (urinary tract infection)       MDM  Pt presents w/ c/o acute onset severe pelvic pain after urinating this afternoon.  Pain has improved since arriving in ED.  On exam, pt in NAD, mild RIGHT suprapubic ttp  and unremarkable genitalia exam.  On re-examination of abd, no tenderness but pt points to pain in LEFT suprapubic.  U/A pos for infection.  This is the most likely cause of sx.  Will prescribe abx and pain medication.  Strict return precautions discussed.         Otilio Miu, [place] 12/30/10 2317

## 2010-12-30 NOTE — ED Notes (Signed)
MD at bedside. 

## 2010-12-30 NOTE — ED Notes (Signed)
Pt states that she had BM earlier today and had intense lower abdominal pain afterwards. Patient tender in lower abdomen and around belly button.

## 2010-12-31 NOTE — ED Provider Notes (Signed)
Medical screening examination/treatment/procedure(s) were performed by non-physician practitioner and as supervising physician I was immediately available for consultation/collaboration.  Robinson Brinkley R Syrenity Klepacki, MD 12/31/10 0047 

## 2011-01-17 ENCOUNTER — Ambulatory Visit: Payer: Medicaid Other | Admitting: Hematology & Oncology

## 2011-01-17 ENCOUNTER — Other Ambulatory Visit: Payer: Medicaid Other | Admitting: Lab

## 2011-02-05 ENCOUNTER — Emergency Department (HOSPITAL_COMMUNITY)
Admission: EM | Admit: 2011-02-05 | Discharge: 2011-02-05 | Disposition: A | Discharge status: Home / Self Care | Payer: Medicaid Other | Attending: Emergency Medicine | Admitting: Emergency Medicine

## 2011-02-05 ENCOUNTER — Encounter (HOSPITAL_COMMUNITY): Payer: Self-pay | Admitting: Emergency Medicine

## 2011-02-05 DIAGNOSIS — R1110 Vomiting, unspecified: Secondary | ICD-10-CM | POA: Insufficient documentation

## 2011-02-05 DIAGNOSIS — Z86718 Personal history of other venous thrombosis and embolism: Secondary | ICD-10-CM | POA: Insufficient documentation

## 2011-02-05 DIAGNOSIS — M545 Low back pain, unspecified: Secondary | ICD-10-CM | POA: Insufficient documentation

## 2011-02-05 DIAGNOSIS — R6883 Chills (without fever): Secondary | ICD-10-CM | POA: Insufficient documentation

## 2011-02-05 DIAGNOSIS — Z79899 Other long term (current) drug therapy: Secondary | ICD-10-CM | POA: Insufficient documentation

## 2011-02-05 DIAGNOSIS — R112 Nausea with vomiting, unspecified: Secondary | ICD-10-CM | POA: Insufficient documentation

## 2011-02-05 DIAGNOSIS — N390 Urinary tract infection, site not specified: Secondary | ICD-10-CM | POA: Insufficient documentation

## 2011-02-05 LAB — URINALYSIS, ROUTINE W REFLEX MICROSCOPIC
Bilirubin Urine: NEGATIVE
Hgb urine dipstick: NEGATIVE
Nitrite: NEGATIVE
Specific Gravity, Urine: 1.02 (ref 1.005–1.030)
pH: 5 (ref 5.0–8.0)

## 2011-02-05 LAB — URINE MICROSCOPIC-ADD ON

## 2011-02-05 LAB — POCT PREGNANCY, URINE: Preg Test, Ur: NEGATIVE

## 2011-02-05 MED ORDER — CIPROFLOXACIN HCL 500 MG PO TABS
500.0000 mg | ORAL_TABLET | Freq: Two times a day (BID) | ORAL | Status: AC
Start: 2011-02-05 — End: 2011-02-15

## 2011-02-05 MED ORDER — ONDANSETRON 8 MG PO TBDP: 8.0000 mg | ORAL_TABLET | Freq: Three times a day (TID) | ORAL | Status: AC | PRN

## 2011-02-05 MED ORDER — CIPROFLOXACIN HCL 500 MG PO TABS
500.0000 mg | ORAL_TABLET | Freq: Once | ORAL | Status: AC
  Administered 2011-02-05: 500 mg via ORAL
  Filled 2011-02-05: qty 1

## 2011-02-05 MED ORDER — PHENAZOPYRIDINE HCL 200 MG PO TABS: 200.0000 mg | ORAL_TABLET | Freq: Three times a day (TID) | ORAL | Status: AC

## 2011-02-05 MED ORDER — ONDANSETRON 4 MG PO TBDP
8.0000 mg | ORAL_TABLET | Freq: Once | ORAL | Status: AC
Start: 2011-02-05 — End: 2011-02-05
  Administered 2011-02-05: 8 mg via ORAL
  Filled 2011-02-05: qty 2

## 2011-02-05 NOTE — ED Provider Notes (Signed)
History     CSN: [id number]  Arrival date & time 02/05/11  7829   First MD Initiated Contact with Patient 02/05/11 2018      Chief Complaint  Patient presents with  . Emesis    (Consider location/radiation/quality/duration/timing/severity/associated sxs/prior treatment) HPI History provided by pt.   Pt has had dysuria, hematuria and increased urinary freq for the past 2 weeks.  Was treated for a UTI 1 month ago and these symptoms similar.  Associated w/ ontermittent chills, diffuse low back pain and N/V.   Vomiting has occurred three times after choking/gagging and not associated w/ po intake.  Able to tolerate food/fluids.    Past Medical History  Diagnosis Date  . Anemia   . Thrombocytosis   . S/P IVC filter   . History of DVT of lower extremity     History reviewed. No pertinent past surgical history.  History reviewed. No pertinent family history.  History  Substance Use Topics  . Smoking status: Not on file  . Smokeless tobacco: Not on file  . Alcohol Use: Not on file    OB History    Grav Para Term Preterm Abortions TAB SAB Ect Mult Living                  Review of Systems  All other systems reviewed and are negative.    Allergies  Review of patient's allergies indicates no known allergies.  Home Medications   Current Outpatient Rx  Name Route Sig Dispense Refill  . FONDAPARINUX SODIUM 10 MG/0.8ML [place] SOLN Subcutaneous Inject 10 mg into the skin daily.      Marland Kitchen CIPROFLOXACIN HCL 500 MG PO TABS Oral Take 1 tablet (500 mg total) by mouth every 12 (twelve) hours. 10 tablet 0  . ONDANSETRON 8 MG PO TBDP Oral Take 1 tablet (8 mg total) by mouth every 8 (eight) hours as needed for nausea. 20 tablet 0  . PHENAZOPYRIDINE HCL 200 MG PO TABS Oral Take 1 tablet (200 mg total) by mouth 3 (three) times daily. 6 tablet 0    BP 128/96  Temp(Src) 98.2 F (36.8 C) (Oral)  Resp 18  SpO2 96%  Physical Exam  Nursing note and vitals reviewed. Constitutional: She is  oriented to person, place, and time. She appears well-developed and well-nourished. No distress.  HENT:  Head: Normocephalic and atraumatic.  Eyes:       Normal appearance  Neck: Normal range of motion.  Cardiovascular: Normal rate and regular rhythm.   Pulmonary/Chest: Effort normal and breath sounds normal.  Abdominal: Soft. Bowel sounds are normal. She exhibits no distension. There is no tenderness.       No CVA ttp  Neurological: She is alert and oriented to person, place, and time.  Skin: Skin is warm and dry. No rash noted.  Psychiatric: She has a normal mood and affect. Her behavior is normal.    ED Course  Procedures (including critical care time)  Labs Reviewed  URINALYSIS, ROUTINE W REFLEX MICROSCOPIC - Abnormal; Notable for the following:    APPearance HAZY (*)    Leukocytes, UA MODERATE (*)    All other components within normal limits  URINE MICROSCOPIC-ADD ON - Abnormal; Notable for the following:    Squamous Epithelial / LPF FEW (*)    Bacteria, UA FEW (*)    All other components within normal limits  POCT PREGNANCY, URINE  POCT PREGNANCY, URINE   No results found.   1. UTI (lower urinary tract  infection)       MDM  Pt presents w/ UTI.  Pyelo unlikely based on exam but has had chills, vomiting and diffuse low back pain.  Will treat w/ cipro in case of kidney infection.  Received first dose in ED.  Tolerating pos.  Also prescribed zofran and pyridium.  Return precautions discussed.         Otilio Miu, [place] 02/06/11 [phone]

## 2011-02-05 NOTE — ED Notes (Signed)
Fluids given.

## 2011-02-05 NOTE — ED Notes (Signed)
Pt stated that she had a UTI in December and they gave her PO ABX. Pain stopped while pt was on the medication, but once she completed the medication, the S/S came back.  She has been having burning twice and bleeding twice. She has had increase in urination and stomach pains. Pt stated that she has a hx of blood clots. And every time she moves, she gags and vomits. This started on Monday and it has happened  Twice.  Currently no pain but normally in lower abdomen. No constipation or vaginal itching. Pt stated that she has had vaginal discharge. Back pain when she sleeps at night. No diarrhea. No CP or SOB.

## 2011-02-05 NOTE — ED Notes (Signed)
Pt c/o vomiting x several days when moving around and burning with urination

## 2011-02-06 ENCOUNTER — Other Ambulatory Visit: Payer: Self-pay | Admitting: *Deleted

## 2011-02-06 DIAGNOSIS — I2699 Other pulmonary embolism without acute cor pulmonale: Secondary | ICD-10-CM

## 2011-02-06 MED ORDER — FONDAPARINUX SODIUM 10 MG/0.8ML ~~LOC~~ SOLN: 10.0000 mg | SUBCUTANEOUS | Status: DC

## 2011-02-06 NOTE — Telephone Encounter (Signed)
Pt called stating that [facility] is unable to refill her Arixtra rx due to a back order issue from their ordering supplier & do not know when a shipment will come in. 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 They have 8 boxes available. [place] will adjust her co-pay amount and provide a refund as she only received 5 syringes from them. Pt verbalized understanding of the arrangements.  Of note: The pt called yesterday after 4 pm leaving a message about not having further syringes but she did not leave a DOB or spelling of her last name and it was very difficult to understand who it was and the phone #. Reminded her to please leave her name (spelling her last name) along with her DOB with each message she leaves. She again verbalized understanding of this.

## 2011-02-07 NOTE — ED Provider Notes (Signed)
Medical screening examination/treatment/procedure(s) were conducted as a shared visit with non-physician practitioner(s) and myself.  I personally evaluated the patient during the encounter  Pt w nv. Flank pain. ua positive. abd soft nt.   Suzi Roots, MD 02/07/11 0830

## 2011-04-25 ENCOUNTER — Encounter: Payer: Medicaid Other | Admitting: Obstetrics & Gynecology

## 2011-06-05 ENCOUNTER — Other Ambulatory Visit: Payer: Self-pay | Admitting: *Deleted

## 2011-06-05 DIAGNOSIS — I2699 Other pulmonary embolism without acute cor pulmonale: Secondary | ICD-10-CM

## 2011-06-05 MED ORDER — FONDAPARINUX SODIUM 10 MG/0.8ML ~~LOC~~ SOLN: 10.0000 mg | SUBCUTANEOUS | Status: DC

## 2011-06-05 NOTE — Telephone Encounter (Signed)
Pt called requesting help with the application for getting "the injections". Reviewed previous records. Pt was enrolled in [place] [facility]. Will issue new rx and have Ari investigate for the pt. She states that she is unemployed and has no source of income. Did not file a W-2 last year.

## 2011-06-07 ENCOUNTER — Telehealth: Payer: Self-pay | Admitting: Hematology & Oncology

## 2011-06-07 NOTE — Telephone Encounter (Signed)
Called and spoke to patient.  Informed her I have the paperwork and application ready for renewing her [place] [facility] patient assistance.  Told her I will need her signature and I would need her to complete a portion of the application.  She stated she would come in to the office to sign/complete her portion of the application.  She did not state when she will come to the office.

## 2011-06-08 ENCOUNTER — Telehealth: Payer: Self-pay | Admitting: Hematology & Oncology

## 2011-06-08 NOTE — Telephone Encounter (Signed)
Faxed completed application along with prescription to [facility] for review.

## 2011-06-15 ENCOUNTER — Telehealth: Payer: Self-pay | Admitting: Hematology & Oncology

## 2011-06-15 NOTE — Telephone Encounter (Signed)
Called Virginia Fitzgerald this morning to follow up about the letter of support she was going to fax yesterday.  I informed her that we had not received any faxes from her, and she explained that she was unsuccessful in faxing it from her school.  She said her school didn't have a fax machine she could use.  I asked her if she lived by a [employer] or [employer].  She said she does not have access to transportation in order to get to somewhere like that.  I asked her if she could bring the letter in to the office.  Again, she said this was not possible due to transportation issues.  She then asked if she could mail the letter to [name].  I said that would be fine.  I also explained that mailing it to [name] would, however, delay the process, and that if she is low on medicine sending through mail could add time to her wait for the drug.  She said she would try to find a way to get to her child's grandmother's house where there is a fax machine and send it from there.  I told her I would keep a lookout for her fax.

## 2011-06-15 NOTE — Telephone Encounter (Signed)
Ms. Courtright called yesterday inquiring about her application status.  I informed her that the application was completed and faxed to [facility] on 06/08/11.  I also explained that there is some processing time involved and that it was probably still being reviewed, but I provided her with the phone number to the organization so that she could inquire on the status.  She called back just a few minutes later stating that [facility] [facility] would be faxing an affidavit to our office requiring Dierdre Searles signature.  She explained that the affidavit was to certify her financial status, at which point I asked for proof of income.  She stated she had no income and was supported by her grandmother.  I asked her to provide [name] with a letter of support signed by her grandmother.  She agreed, and said she would fax it to our office yesterday afternoon from her school.

## 2011-06-15 NOTE — Telephone Encounter (Signed)
Recieved a call from [facility].  She said she was having a very hard time getting the letter sent in to [name] and/or finding a way to bring it in.  She stressed that she was running low on medication and only had a day or so left.  Ms. Brun asked what we could do about this because she "knows Dr. Myna Hidalgo does not want her missing the medication."  I told her that the samples we provided to her when she came in to complete the application originally was all that we had of that particular drug.  I told her I would ask the nurse for any ideas.  After reviewing with Dr. Myna Hidalgo, he stated she would be ok to go two weeks without medication.  I also asked her in what area she lived in.  She said [place].  I asked if she would be able to get to the [employer] more easily.  She said that she could.  I suggested that if she go there and go to the [facility], she could ask them to fax the letter over to me.  It was the only other thing I could think of in an attempt to acquire the necessary information to complete the application process and get more medicine to her.  She agreed to try to do this.  I told her I would call back in regards to her medication after discussing it with the nurse.  Of note, per RN stated she needed help with the application within days of it expiring.  Also, she has not kept appointments and was told she will need to be seen within 90 days.

## 2011-06-15 NOTE — Telephone Encounter (Signed)
Called to follow-up with Virginia Fitzgerald.  I explained to her that it is crucial that she get her letter of support sent in to me as soon as possible and by any means possible.  I told her that I spoke with Amy and Dr. Myna Hidalgo and that the doctor said she would be ok for two weeks.  I also explained to her that we do not have any more of that medication on hand to give to her.  Therefore, getting that letter of support into the office is very important so that we can complete the process and get more drug to her.  She said she was about to get on the bus and head to the [facility] at [facility] so that they can fax it to our office.  I told her I will follow up with a phone call to her in the morning whether I receive a fax or not.

## 2011-06-18 ENCOUNTER — Telehealth: Payer: Self-pay | Admitting: Hematology & Oncology

## 2011-06-18 NOTE — Telephone Encounter (Signed)
Called Virginia Fitzgerald again in an attempt to get in touch.  Spoke to her grandmother who informed me Virginia Fitzgerald is in school.  Left a message with grandmother asking for Virginia Fitzgerald to call Dr. Junie Bame office.

## 2011-06-18 NOTE — Telephone Encounter (Signed)
Attempted to call Ms. Jorgensen to inform her that we did not received the faxed letter of support.  Trying to follow up and determine if she sent if last Friday 06/15/11.

## 2011-06-19 ENCOUNTER — Telehealth: Payer: Self-pay | Admitting: Hematology & Oncology

## 2011-06-19 NOTE — Telephone Encounter (Signed)
Spoke to Virginia Fitzgerald.  She stated that she had not been able to fax the letter of support to our office as of yet.  She said she would be having her aunt drive her to take care of this today, and said she would fax it to our office today.   Once again, I asked if she had any proof of financial status i.e. bank account statement, statement from any federal support programs, etc.  She again said she has nothing of that nature.  I verified that she had our fax number and ensured she was aware of what the letter of support should include.  I told her I would contact her upon receipt of the fax.

## 2011-06-20 ENCOUNTER — Telehealth: Payer: Self-pay | Admitting: Hematology & Oncology

## 2011-06-20 ENCOUNTER — Emergency Department (HOSPITAL_COMMUNITY)
Admission: EM | Admit: 2011-06-20 | Discharge: 2011-06-20 | Disposition: A | Discharge status: Home / Self Care | Payer: Self-pay | Attending: Emergency Medicine | Admitting: Emergency Medicine

## 2011-06-20 ENCOUNTER — Encounter (HOSPITAL_COMMUNITY): Payer: Self-pay | Admitting: Emergency Medicine

## 2011-06-20 DIAGNOSIS — Z86718 Personal history of other venous thrombosis and embolism: Secondary | ICD-10-CM | POA: Insufficient documentation

## 2011-06-20 DIAGNOSIS — IMO0001 Reserved for inherently not codable concepts without codable children: Secondary | ICD-10-CM | POA: Insufficient documentation

## 2011-06-20 DIAGNOSIS — Z7901 Long term (current) use of anticoagulants: Secondary | ICD-10-CM | POA: Insufficient documentation

## 2011-06-20 DIAGNOSIS — D6859 Other primary thrombophilia: Secondary | ICD-10-CM | POA: Insufficient documentation

## 2011-06-20 DIAGNOSIS — M79609 Pain in unspecified limb: Secondary | ICD-10-CM | POA: Insufficient documentation

## 2011-06-20 LAB — BASIC METABOLIC PANEL
Calcium: 9.4 mg/dL (ref 8.4–10.5)
Creatinine, Ser: 0.74 mg/dL (ref 0.50–1.10)
GFR calc Af Amer: >90 mL/min (ref >90)
GFR calc non Af Amer: >90 mL/min (ref >90)
Sodium: 139 mEq/L (ref 135–145)

## 2011-06-20 LAB — DIFFERENTIAL
Basophils Absolute: 0 10*3/uL (ref 0.0–0.1)
Basophils Relative: 0 % (ref 0–1)
Eosinophils Absolute: 0.2 10*3/uL (ref 0.0–0.7)
Eosinophils Relative: 2 % (ref 0–5)
Lymphocytes Relative: 25 % (ref 12–46)
Monocytes Absolute: 0.5 10*3/uL (ref 0.1–1.0)

## 2011-06-20 LAB — CBC
HCT: 38.7 % (ref 36.0–46.0)
MCHC: 33.1 g/dL (ref 30.0–36.0)
MCV: 79.3 fL (ref 78.0–100.0)
Platelets: 349 10*3/uL (ref 150–400)
RDW: 14.5 % (ref 11.5–15.5)
WBC: 13.9 10*3/uL — ABNORMAL HIGH (ref 4.0–10.5)

## 2011-06-20 MED ORDER — HYDROCODONE-ACETAMINOPHEN 5-325 MG PO TABS
2.0000 | ORAL_TABLET | Freq: Once | ORAL | Status: AC
  Administered 2011-06-20: 2 via ORAL
  Filled 2011-06-20: qty 2

## 2011-06-20 MED ORDER — ENOXAPARIN SODIUM 150 MG/ML ~~LOC~~ SOLN
130.0000 mg | SUBCUTANEOUS | Status: AC
  Administered 2011-06-20: 130 mg via SUBCUTANEOUS
  Filled 2011-06-20: qty 1

## 2011-06-20 MED ORDER — ENOXAPARIN SODIUM 150 MG/ML ~~LOC~~ SOLN: 130.0000 mg | Freq: Two times a day (BID) | SUBCUTANEOUS | Status: DC

## 2011-06-20 NOTE — Discharge Instructions (Signed)
Please return in the morning for venous Doppler ultrasounds of your legs.  Also ask for the case manager when you return and she will help provide you with a knife medication to last few and tell your new medications come.

## 2011-06-20 NOTE — ED Notes (Signed)
Discussed case with EDP, RNCM, pharmacy, and pt/family.  Pharmacy unable to provide pt with Arixtra because of the price.  Pharmacy suggested pt be prescribed Lovenox b/c of its pt assistance program.  If determined that pt can take Lovenox she can bring rx to ED tomorrow and meet with Konrad Felix ED RN CM who will assist with pt getting drug/filing paperwork.  On call RNCM, Silva Bandy will speak with RNCM in the am re: pt/need for meds.

## 2011-06-20 NOTE — ED Provider Notes (Signed)
History     CSN: [id number]  Arrival date & time 06/20/11  1754   First MD Initiated Contact with Patient 06/20/11 1951      Chief Complaint  Patient presents with  . Leg Pain  . Generalized Body Aches     HPI Hx of DVT in past.  Has been out of her medications for a few days.  Some tenderness in LR leg. May be similar to past dvt.  No sob.  No chest pain.   Past Medical History  Diagnosis Date  . Anemia   . Thrombocytosis   . S/P IVC filter   . History of DVT of lower extremity     History reviewed. No pertinent past surgical history.  History reviewed. No pertinent family history.  History  Substance Use Topics  . Smoking status: Not on file  . Smokeless tobacco: Not on file  . Alcohol Use: Not on file    OB History    Grav Para Term Preterm Abortions TAB SAB Ect Mult Living                  Review of Systems  All other systems reviewed and are negative.    Allergies  Review of patient's allergies indicates no known allergies.  Home Medications   Current Outpatient Rx  Name Route Sig Dispense Refill  . FONDAPARINUX SODIUM 10 MG/0.8ML [place] SOLN Subcutaneous Inject 10 mg into the skin daily.    Marland Kitchen ENOXAPARIN SODIUM 150 MG/ML  SOLN Subcutaneous Inject 0.87 mLs (130 mg total) into the skin every 12 (twelve) hours. 20 Syringe 0    BP 120/66  Pulse 98  Temp(Src) 98.2 F (36.8 C) (Oral)  Resp 18  Wt 287 lb 11.2 oz (130.5 kg)  SpO2 99%  Physical Exam  Nursing note and vitals reviewed. Constitutional: She is oriented to person, place, and time. She appears well-developed and well-nourished. No distress.  HENT:  Head: Normocephalic and atraumatic.  Eyes: Pupils are equal, round, and reactive to light.  Neck: Normal range of motion.  Cardiovascular: Normal rate and intact distal pulses.   Pulmonary/Chest: No respiratory distress.  Abdominal: Normal appearance. She exhibits no distension.  Musculoskeletal: Normal range of motion.       Right lower  leg: Normal.       Left lower leg: Normal.       Both calves measure 45 com at largest point  Neurological: She is alert and oriented to person, place, and time. No cranial nerve deficit.  Skin: Skin is warm and dry. No rash noted.  Psychiatric: She has a normal mood and affect. Her behavior is normal.    ED Course  Procedures (including critical care time)  Labs Reviewed  CBC - Abnormal; Notable for the following:    WBC 13.9 (*)    All other components within normal limits  DIFFERENTIAL - Abnormal; Notable for the following:    Neutro Abs 9.7 (*)    All other components within normal limits  BASIC METABOLIC PANEL - Abnormal; Notable for the following:    Potassium 3.4 (*)    All other components within normal limits  LAB REPORT - SCANNED   No results found.   1. Hypercoagulable state       MDM  Plan Social work was contacted. Discussed with hematology.  Will treat with Lovenox and bring back in AM for venous dopplers.  Case manager will  arrance to bridge with lovenox until she gets her new  medication in 10 days.        Nelia Shi, MD 06/22/11 1131

## 2011-06-20 NOTE — ED Notes (Signed)
Patient states that she has a history of blood clots. States that she is having numbness and tingling in both legs from hip to ankles.  C/O aching, and a warm feeling in both legs.  She Reports pain around her right knee. She also C/O having a little mid sternal chest pain that is non-radiating. Reports nothing makes the chest pain worse or makes it feel better.

## 2011-06-20 NOTE — Telephone Encounter (Signed)
Attempted to contact Virginia Fitzgerald.  Trying to inform her that we received the faxed copy of the letter of support, however, no signature was provided.  Also the letter was backdated to May 15th, which is not accurate.  The grandmother answered the phone and said Virginia Fitzgerald is in school until 4pm.  I asked the grandmother if she wrote the letter provided and she said she did.  I told her that we are needing this to have a signature on it, and not just a printed name.  We need the letter to be as official as possibly so that it may be supportively used in the event of any questioning or auditing.  I asked that the grandmother provide [name] with a letter with a signature and/or to have Virginia Fitzgerald call into the office.

## 2011-06-20 NOTE — Telephone Encounter (Signed)
Called Adamary Cielo to inform her I still have not recieved a faxed letter of support.  Her grandmother answered and said she was not available.  I asked that she tell Wilhelmena to call Dr. Gustavo Lah office.  She said she would.

## 2011-06-20 NOTE — ED Notes (Signed)
Pt c/o generalized body aches and HA; pt c/o right leg tingling that feels same as when had DVT in past; pt sts off anticoagulants x 3 days due to insurance issues

## 2011-06-20 NOTE — ED Notes (Signed)
Patient is AOx4 and comfortable with her discharge instructions. 

## 2011-06-21 ENCOUNTER — Telehealth: Payer: Self-pay | Admitting: Hematology & Oncology

## 2011-06-21 ENCOUNTER — Ambulatory Visit (HOSPITAL_COMMUNITY)
Admission: RE | Admit: 2011-06-21 | Discharge: 2011-06-21 | Disposition: A | Discharge status: Home / Self Care | Payer: Self-pay | Source: Ambulatory Visit | Attending: Emergency Medicine | Admitting: Emergency Medicine

## 2011-06-21 DIAGNOSIS — M7989 Other specified soft tissue disorders: Secondary | ICD-10-CM | POA: Insufficient documentation

## 2011-06-21 DIAGNOSIS — M79609 Pain in unspecified limb: Secondary | ICD-10-CM | POA: Insufficient documentation

## 2011-06-21 DIAGNOSIS — M79606 Pain in leg, unspecified: Secondary | ICD-10-CM

## 2011-06-21 NOTE — Progress Notes (Signed)
VASCULAR LAB PRELIMINARY  PRELIMINARY  PRELIMINARY  PRELIMINARY  Bilateral lower extremity venous Dopplers completed.    Preliminary report:  No DVT or SVT noted in the bilateral lower extremities.  Virginia Fitzgerald, 06/21/2011, 10:53 AM

## 2011-06-21 NOTE — Telephone Encounter (Signed)
Attempted to contact Robby this morning, as I have not heard back from her.  I left a message with her grandmother asking her to call into the office.  I need to discuss with her the issues existing with the letter of support she faxed in.  She does not have voicemail so I was unable to leave any kind of message.

## 2011-06-22 ENCOUNTER — Telehealth: Payer: Self-pay | Admitting: Hematology & Oncology

## 2011-06-22 NOTE — Telephone Encounter (Signed)
Aryka Silvers called.  I explained the issues with the letter of support she faxed in.  She stated that the letter was not in fact written or signed by her grandmother, but that Heidemarie's mother was the actual creator of the document, placing the grangmother's name at the bottom.  I asked if Lesta's mother had power of attorney over the grandmother.  She said no.  I asked if Adalynne had power of attorney over her grandmother.  She said no.  I told her that the letter must have her grandmother's signature.  It must be signed by her grandmother if she in fact supports her financially.  Cloie said that her grandmother has a hard time writing and asked if the letter could be written by someone else.  I said yes, but that it must be signed by her grandmother and it must be a signature, not a printed name.  She said she would do this, but would not be faxing it to the office until Tuesday.  I said that was fine and that I would call confirming receipt of the document.  She also informed me that she was admitted a few days prior due to pain in her leg and concern for a clot.  She explained that when she was discharged she was given a temporary supply of Lovenox.

## 2011-07-06 NOTE — Care Management Note (Signed)
    Page 1 of 1   06/22/2011     4:41:13 PM   CARE MANAGEMENT NOTE 06/22/2011  Patient:  Virginia Fitzgerald, Virginia Fitzgerald   Account Number:  [id number]  Date Initiated:  06/21/2011  Documentation initiated by:  Letha Cape  Subjective/Objective Assessment:   dx dvt     Action/Plan:   lovenox  med ast   Anticipated DC Date:  06/21/2011   Anticipated DC Plan:  HOME/SELF CARE      DC Planning Services  CM consult      Choice offered to / List presented to:             Status of service:  Completed, signed off Medicare Important Message given?   (If response is "NO", the following Medicare IM given date fields will be blank) Date Medicare IM given:   Date Additional Medicare IM given:    Discharge Disposition:  HOME/SELF CARE  Per UR Regulation:    If discussed at Long Length of Stay Meetings, dates discussed:    Comments:  06/21/11 10:00 Letha Cape RN, BSN [phone] patient was needing med ast with lovenox.  Assisted patient with meds.

## 2011-08-16 ENCOUNTER — Other Ambulatory Visit (HOSPITAL_BASED_OUTPATIENT_CLINIC_OR_DEPARTMENT_OTHER): Payer: Self-pay | Admitting: Lab

## 2011-08-16 ENCOUNTER — Ambulatory Visit (HOSPITAL_BASED_OUTPATIENT_CLINIC_OR_DEPARTMENT_OTHER): Payer: Self-pay | Admitting: Hematology & Oncology

## 2011-08-16 VITALS — BP 135/81 | HR 99 | Temp 97.8°F | Ht 62.0 in | Wt 268.0 lb

## 2011-08-16 DIAGNOSIS — D509 Iron deficiency anemia, unspecified: Secondary | ICD-10-CM

## 2011-08-16 DIAGNOSIS — I2699 Other pulmonary embolism without acute cor pulmonale: Secondary | ICD-10-CM

## 2011-08-16 DIAGNOSIS — O2230 Deep phlebothrombosis in pregnancy, unspecified trimester: Secondary | ICD-10-CM

## 2011-08-16 LAB — CBC WITH DIFFERENTIAL (CANCER CENTER ONLY)
BASO%: 0.2 % (ref 0.0–2.0)
Eosinophils Absolute: 0.2 10*3/uL (ref 0.0–0.5)
LYMPH%: 22 % (ref 14.0–48.0)
MCV: 80 fL — ABNORMAL LOW (ref 81–101)
MONO#: 0.5 10*3/uL (ref 0.1–0.9)
NEUT#: 8.3 10*3/uL — ABNORMAL HIGH (ref 1.5–6.5)
Platelets: 352 10*3/uL (ref 145–400)
RBC: 4.9 10*6/uL (ref 3.70–5.32)
RDW: 15.1 % (ref 11.1–15.7)
WBC: 11.5 10*3/uL — ABNORMAL HIGH (ref 3.9–10.0)

## 2011-08-16 NOTE — Progress Notes (Signed)
This office note has been dictated.

## 2011-08-17 LAB — IRON AND TIBC
%SAT: 8 % — ABNORMAL LOW (ref 20–55)
Iron: 24 ug/dL — ABNORMAL LOW (ref 42–145)
UIBC: 282 ug/dL (ref 125–400)

## 2011-08-17 LAB — D-DIMER, QUANTITATIVE: D-Dimer, Quant: 0.35 ug/mL-FEU (ref 0.00–0.48)

## 2011-08-17 NOTE — Progress Notes (Signed)
DIAGNOSES: 1. Recurrent pulmonary embolism. 2. History of iron deficiency anemia.  CURRENT THERAPY:  Arixtra 10 mg subcu daily.  INTERIM HISTORY:  Ms. Zettlemoyer comes in for followup.  I last saw her a year ago.  She has not been able to make it in for followup otherwise. She has been busy.  She is in school now.  She is having some shortness of breath.  This has been going on for about a week or so.  We are going to do a CT angiogram, I think.  I would think, however, that her chance of having a pulmonary embolism is going to be very low.  She is going to school.  She is doing okay with this.  She did have Dopplers done of her legs recently.  This was back in May. Everything turned out okay without any evidence of DVTs in the legs.  She has had no bleeding.  There has been no change in bowel or bladder habits.  She does have a Mirena IUD so she does not have cycles.  PHYSICAL EXAMINATION:  General: This is an obese black female in no obvious distress.  Vital signs: Temperature 97.8, pulse 99, respiratory rate 22, blood pressure 135/81.  Weight is 268.  Head and neck: Normocephalic, atraumatic skull.  There are no ocular or oral lesions. There are no palpable cervical or supraclavicular lymph nodes.  Lungs: Clear bilaterally.  There is some wheezes at the right base.  Cardiac: Regular rate and rhythm with no murmurs, rubs or bruits.  Abdomen:  Soft with good bowel sounds.  There is no palpable abdominal mass.  There is no palpable hepatosplenomegaly.  Extremities:  Shows no clubbing, cyanosis or edema.  She has good range of motion of her joints.  Skin: No rashes, ecchymoses or petechia.  LABORATORY STUDIES:  White cell count is __________.5, hemoglobin 12.8, hematocrit 39, platelet count 352.  IMPRESSION:  Ms. Koob is a 26 year old, [name] with recurrent pulmonary emboli.  This is idiopathic.  She has had extensive workup to date.  So far nothing has  been found to be "positive" for a thrombophilic condition.  Again, I think a CT angiogram is indicated.  However, she does not have Medicaid right now.  She has no insurance.  We will just have to figure out a way to get this for her.  I would like to get her back to see [name] in another month or so for followup.  I believe that we really need to make sure that we continue to follow up with her.    ______________________________ Josph Macho, M.D. PRE/MEDQ  D:  08/16/2011  T:  08/17/2011  Job:  [id number]

## 2011-08-20 ENCOUNTER — Encounter (HOSPITAL_COMMUNITY): Payer: Self-pay

## 2011-08-20 ENCOUNTER — Ambulatory Visit (HOSPITAL_COMMUNITY)
Admission: RE | Admit: 2011-08-20 | Discharge: 2011-08-20 | Disposition: A | Discharge status: Home / Self Care | Payer: Medicaid Other | Source: Ambulatory Visit | Attending: Hematology & Oncology | Admitting: Hematology & Oncology

## 2011-08-20 DIAGNOSIS — I2699 Other pulmonary embolism without acute cor pulmonale: Secondary | ICD-10-CM

## 2011-08-20 DIAGNOSIS — D509 Iron deficiency anemia, unspecified: Secondary | ICD-10-CM

## 2011-08-20 DIAGNOSIS — O2230 Deep phlebothrombosis in pregnancy, unspecified trimester: Secondary | ICD-10-CM

## 2011-08-20 DIAGNOSIS — R0602 Shortness of breath: Secondary | ICD-10-CM | POA: Insufficient documentation

## 2011-08-20 DIAGNOSIS — R911 Solitary pulmonary nodule: Secondary | ICD-10-CM | POA: Insufficient documentation

## 2011-08-20 DIAGNOSIS — D6859 Other primary thrombophilia: Secondary | ICD-10-CM | POA: Insufficient documentation

## 2011-08-20 MED ORDER — IOHEXOL 350 MG/ML SOLN
100.0000 mL | Freq: Once | INTRAVENOUS | Status: AC | PRN
  Administered 2011-08-20: 100 mL via INTRAVENOUS

## 2011-08-22 ENCOUNTER — Telehealth: Payer: Self-pay | Admitting: *Deleted

## 2011-08-22 NOTE — Telephone Encounter (Signed)
Message copied by Anselm Jungling on Wed Aug 22, 2011 10:18 AM ------      Message from: Arlan Organ R      Created: Wed Aug 22, 2011  7:39 AM       Call and tell her that there is NO blood clot in her lung!!!  Cindee Lame

## 2011-08-22 NOTE — Telephone Encounter (Signed)
Called patients home to give results of scan.  Talked to unnamed family member.  Asked her to call us back when she gets home.

## 2011-08-23 ENCOUNTER — Other Ambulatory Visit: Payer: Self-pay | Admitting: *Deleted

## 2011-08-23 ENCOUNTER — Telehealth: Payer: Self-pay | Admitting: *Deleted

## 2011-08-23 ENCOUNTER — Telehealth: Payer: Self-pay | Admitting: Hematology & Oncology

## 2011-08-23 DIAGNOSIS — D509 Iron deficiency anemia, unspecified: Secondary | ICD-10-CM

## 2011-08-23 NOTE — Progress Notes (Signed)
Need feraheme 1020mg  x 1 dose in 1-2 weeks per Dr Myna Hidalgo. Placed orders under sign & held. See phone note for further details.

## 2011-08-23 NOTE — Telephone Encounter (Signed)
Pt made 8-2 iron infusion appointment

## 2011-08-23 NOTE — Telephone Encounter (Addendum)
Message copied by Wynonia Hazard on Thu Aug 23, 2011  9:15 AM ------      Message from: Arlan Organ R      Created: Thu Aug 23, 2011  7:44 AM       Call - iron is low.  Need feraheme 1020mg  x 1 dose in 1-2 weeks.  Please set this up.  Cindee Lame  08/23/11 - Called the # listed for the pt and asked to speak to Ms. Shall. The person on the other line stated she was Ms Sisler. Informed her of the above and offered to make her an appt at that time or to call back later. The lady then stated "this is her grandmother, I will have her call you when she gets home from school". Will place send info to scheduling via onc tx scheduler and place orders under sign & held.

## 2011-08-31 ENCOUNTER — Ambulatory Visit: Payer: Self-pay

## 2011-09-20 ENCOUNTER — Telehealth: Payer: Self-pay | Admitting: Hematology & Oncology

## 2011-09-20 NOTE — Telephone Encounter (Signed)
Faxed requested records.

## 2011-10-04 ENCOUNTER — Emergency Department (HOSPITAL_COMMUNITY)
Admission: EM | Admit: 2011-10-04 | Discharge: 2011-10-04 | Disposition: A | Discharge status: Home / Self Care | Payer: Medicaid Other | Attending: Emergency Medicine | Admitting: Emergency Medicine

## 2011-10-04 ENCOUNTER — Encounter (HOSPITAL_COMMUNITY): Payer: Self-pay

## 2011-10-04 DIAGNOSIS — D649 Anemia, unspecified: Secondary | ICD-10-CM | POA: Insufficient documentation

## 2011-10-04 DIAGNOSIS — N390 Urinary tract infection, site not specified: Secondary | ICD-10-CM

## 2011-10-04 DIAGNOSIS — E669 Obesity, unspecified: Secondary | ICD-10-CM | POA: Insufficient documentation

## 2011-10-04 DIAGNOSIS — A599 Trichomoniasis, unspecified: Secondary | ICD-10-CM

## 2011-10-04 DIAGNOSIS — A64 Unspecified sexually transmitted disease: Secondary | ICD-10-CM

## 2011-10-04 DIAGNOSIS — Z86718 Personal history of other venous thrombosis and embolism: Secondary | ICD-10-CM | POA: Insufficient documentation

## 2011-10-04 LAB — URINALYSIS, ROUTINE W REFLEX MICROSCOPIC
Glucose, UA: NEGATIVE mg/dL
Ketones, ur: NEGATIVE mg/dL
Protein, ur: 30 mg/dL — AB
pH: 5.5 (ref 5.0–8.0)

## 2011-10-04 LAB — CBC WITH DIFFERENTIAL/PLATELET
Basophils Absolute: 0 10*3/uL (ref 0.0–0.1)
Eosinophils Relative: 1 % (ref 0–5)
HCT: 41 % (ref 36.0–46.0)
Hemoglobin: 13.5 g/dL (ref 12.0–15.0)
Lymphocytes Relative: 23 % (ref 12–46)
MCHC: 32.9 g/dL (ref 30.0–36.0)
MCV: 79.3 fL (ref 78.0–100.0)
Monocytes Absolute: 0.6 10*3/uL (ref 0.1–1.0)
Monocytes Relative: 5 % (ref 3–12)
RDW: 14.9 % (ref 11.5–15.5)

## 2011-10-04 LAB — URINE MICROSCOPIC-ADD ON

## 2011-10-04 LAB — COMPREHENSIVE METABOLIC PANEL
AST: 13 U/L (ref 0–37)
BUN: 11 mg/dL (ref 6–23)
CO2: 24 mEq/L (ref 19–32)
Calcium: 9.8 mg/dL (ref 8.4–10.5)
Creatinine, Ser: 0.85 mg/dL (ref 0.50–1.10)
GFR calc Af Amer: >90 mL/min (ref >90)
GFR calc non Af Amer: >90 mL/min (ref >90)
Total Bilirubin: 0.1 mg/dL — ABNORMAL LOW (ref 0.3–1.2)

## 2011-10-04 LAB — POCT PREGNANCY, URINE: Preg Test, Ur: NEGATIVE

## 2011-10-04 MED ORDER — CEFTRIAXONE SODIUM 250 MG IJ SOLR: 250.0000 mg | INTRAMUSCULAR | Status: DC

## 2011-10-04 MED ORDER — METRONIDAZOLE 500 MG PO TABS: 2000.0000 mg | ORAL_TABLET | Freq: Once | ORAL | Status: DC

## 2011-10-04 MED ORDER — NITROFURANTOIN MONOHYD MACRO 100 MG PO CAPS: 100.0000 mg | ORAL_CAPSULE | Freq: Two times a day (BID) | ORAL | Status: AC

## 2011-10-04 MED ORDER — AZITHROMYCIN 250 MG PO TABS: 1000.0000 mg | ORAL_TABLET | Freq: Every day | ORAL | Status: DC

## 2011-10-04 NOTE — ED Provider Notes (Signed)
History     CSN: [id number]  Arrival date & time 10/04/11  1627   First MD Initiated Contact with Patient 10/04/11 2114      Chief Complaint  Patient presents with  . Abdominal Pain   HPI  History provided by the patient. 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 Symptoms began 4 days ago. Patient has also noticed some dysuria and vaginal odor. She denies having significant vaginal discharge. She denies any fever, chills, sweats, nausea vomiting symptoms. Denies any diarrhea or constipation. She reports having similar symptoms recently earlier this year states she was diagnosed with UTI. Patient has no other complaints at this time.    Past Medical History  Diagnosis Date  . Anemia   . Thrombocytosis   . S/P IVC filter   . History of DVT of lower extremity     History reviewed. No pertinent past surgical history.  History reviewed. No pertinent family history.  History  Substance Use Topics  . Smoking status: Not on file  . Smokeless tobacco: Not on file  . Alcohol Use: Not on file    OB History    Grav Para Term Preterm Abortions TAB SAB Ect Mult Living                  Review of Systems  Constitutional: Negative for fever, chills and appetite change.  Gastrointestinal: Positive for abdominal pain. Negative for nausea, vomiting, diarrhea and constipation.  Genitourinary: Positive for dysuria, frequency and flank pain. Negative for hematuria, vaginal bleeding and vaginal discharge.  Skin: Negative for rash.  Neurological: Negative for light-headedness.    Allergies  Review of patient's allergies indicates no known allergies.  Home Medications   Current Outpatient Rx  Name Route Sig Dispense Refill  . FONDAPARINUX SODIUM 10 MG/0.8ML [place] SOLN Subcutaneous Inject 10 mg into the skin  daily.      BP 118/66  Pulse 89  Temp 98.6 F (37 C) (Oral)  Resp 18  SpO2 100%  Physical Exam  Nursing note and vitals reviewed. Constitutional: She is oriented to person, place, and time. She appears well-developed and well-nourished. No distress.  HENT:  Head: Normocephalic.  Neck: Normal range of motion. Neck supple.  Cardiovascular: Normal rate and regular rhythm.   Pulmonary/Chest: Effort normal and breath sounds normal. No respiratory distress. She has no wheezes. She has no rales.  Abdominal: Soft. There is tenderness in the suprapubic area and left lower quadrant. There is no rebound, no guarding, no CVA tenderness, no tenderness at McBurney's point and negative Murphy's sign.       Obese  Genitourinary:       Pt refused  Neurological: She is alert and oriented to person, place, and time.  Skin: Skin is warm and dry.  Psychiatric: She has a normal mood and affect. Her behavior is normal.    ED Course  Procedures   Results for orders placed during the hospital encounter of 10/04/11  URINALYSIS, ROUTINE W REFLEX MICROSCOPIC      Component Value Range   Color, Urine YELLOW  YELLOW   APPearance TURBID (*) CLEAR   Specific Gravity, Urine 1.029  1.005 - 1.030   pH 5.5  5.0 - 8.0   Glucose, UA NEGATIVE  NEGATIVE mg/dL   Hgb urine dipstick SMALL (*) NEGATIVE  Bilirubin Urine NEGATIVE  NEGATIVE   Ketones, ur NEGATIVE  NEGATIVE mg/dL   Protein, ur 30 (*) NEGATIVE mg/dL   Urobilinogen, UA 1.0  0.0 - 1.0 mg/dL   Nitrite NEGATIVE  NEGATIVE   Leukocytes, UA LARGE (*) NEGATIVE  CBC WITH DIFFERENTIAL      Component Value Range   WBC 14.1 (*) 4.0 - 10.5 K/uL   RBC 5.17 (*) 3.87 - 5.11 MIL/uL   Hemoglobin 13.5  12.0 - 15.0 g/dL   HCT 16.1  09.6 - 04.5 %   MCV 79.3  78.0 - 100.0 fL   MCH 26.1  26.0 - 34.0 pg   MCHC 32.9  30.0 - 36.0 g/dL   RDW 40.9  81.1 - 91.4 %   Platelets 382  150 - 400 K/uL   Neutrophils Relative 71  43 - 77 %   Neutro Abs 10.0 (*) 1.7 - 7.7 K/uL     Lymphocytes Relative 23  12 - 46 %   Lymphs Abs 3.3  0.7 - 4.0 K/uL   Monocytes Relative 5  3 - 12 %   Monocytes Absolute 0.6  0.1 - 1.0 K/uL   Eosinophils Relative 1  0 - 5 %   Eosinophils Absolute 0.2  0.0 - 0.7 K/uL   Basophils Relative 0  0 - 1 %   Basophils Absolute 0.0  0.0 - 0.1 K/uL  COMPREHENSIVE METABOLIC PANEL      Component Value Range   Sodium 138  135 - 145 mEq/L   Potassium 3.5  3.5 - 5.1 mEq/L   Chloride 102  96 - 112 mEq/L   CO2 24  19 - 32 mEq/L   Glucose, Bld 85  70 - 99 mg/dL   BUN 11  6 - 23 mg/dL   Creatinine, Ser 7.82  0.50 - 1.10 mg/dL   Calcium 9.8  8.4 - 95.6 mg/dL   Total Protein 7.9  6.0 - 8.3 g/dL   Albumin 3.7  3.5 - 5.2 g/dL   AST 13  0 - 37 U/L   ALT 19  0 - 35 U/L   Alkaline Phosphatase 93  39 - 117 U/L   Total Bilirubin 0.1 (*) 0.3 - 1.2 mg/dL   GFR calc non Af Amer >90  >90 mL/min   GFR calc Af Amer >90  >90 mL/min  POCT PREGNANCY, URINE      Component Value Range   Preg Test, Ur NEGATIVE  NEGATIVE  URINE MICROSCOPIC-ADD ON      Component Value Range   Squamous Epithelial / LPF MANY (*) RARE   WBC, UA TOO NUMEROUS TO COUNT  <3 WBC/hpf   RBC / HPF 0-2  <3 RBC/hpf   Bacteria, UA MANY (*) RARE   Urine-Other TRICHOMONAS PRESENT          1. STD (female)   2. Trichomonas   3. UTI (lower urinary tract infection)       MDM  9:45 PM patient seen and evaluated. Patient in no acute distress and currently wishes to leave. I did have a chance to speak with patient and to partial examination. She declined pelvic at this time and wishes to followup with the [facility] health Department tomorrow. I did inform patient of positive STD. At this time we'll give one-time treatment of Flagyl as well as prescription for possible UTI. Patient understands the need to inform all sexual partners and followup with the [facility] health Department. Patient also given strict return precautions.  Angus Seller, [place] 10/05/11 [phone]

## 2011-10-04 NOTE — ED Notes (Signed)
Pt reports (L) side abd pain and vaginal odor x4 days. Pt denies vaginal d/c, burning w/urination, N/V/D

## 2011-10-04 NOTE — ED Notes (Signed)
In to d/c pt - positive gown sign - unable to locate pt   

## 2011-10-09 ENCOUNTER — Other Ambulatory Visit: Payer: Self-pay | Admitting: *Deleted

## 2011-10-09 NOTE — Telephone Encounter (Signed)
Received refill request from [facility] for Arixtra. She has not come in for her iron infusion & did not make a 1 mo follow-up appt. Asked that the pharmacy have the pt call the office to make an appt 1st.

## 2011-10-10 ENCOUNTER — Other Ambulatory Visit: Payer: Self-pay | Admitting: *Deleted

## 2011-10-10 DIAGNOSIS — I2699 Other pulmonary embolism without acute cor pulmonale: Secondary | ICD-10-CM

## 2011-10-10 MED ORDER — FONDAPARINUX SODIUM 10 MG/0.8ML ~~LOC~~ SOLN: 10.0000 mg | SUBCUTANEOUS | Status: DC

## 2011-10-10 NOTE — Telephone Encounter (Signed)
Pt called regarding her Arixtra rx. Explained that it was declined until she made a f/u appt within a months time as she did not come in for her iron infusion or 1 mo follow-up. She agreed to come in for an appt therefore a 30 day supply of Arixtra was sent via e-prescribe to [facility].

## 2011-10-12 ENCOUNTER — Telehealth: Payer: Self-pay | Admitting: Hematology & Oncology

## 2011-10-12 ENCOUNTER — Other Ambulatory Visit: Payer: Medicaid Other | Admitting: Lab

## 2011-10-12 ENCOUNTER — Ambulatory Visit: Payer: Medicaid Other | Admitting: Medical

## 2011-10-12 NOTE — ED Provider Notes (Signed)
Medical screening examination/treatment/procedure(s) were performed by non-physician practitioner and as supervising physician I was immediately available for consultation/collaboration.   Marwan T Powers, MD 10/12/11 1502 

## 2011-10-12 NOTE — Telephone Encounter (Signed)
Left message with Grandmother for pt to call our office

## 2011-11-08 ENCOUNTER — Telehealth: Payer: Self-pay | Admitting: Hematology & Oncology

## 2011-11-08 NOTE — Telephone Encounter (Signed)
Pt made 10-14 appointment

## 2011-11-12 ENCOUNTER — Other Ambulatory Visit: Payer: Medicaid Other | Admitting: Lab

## 2011-11-12 ENCOUNTER — Telehealth: Payer: Self-pay | Admitting: Hematology & Oncology

## 2011-11-12 ENCOUNTER — Ambulatory Visit: Payer: Medicaid Other | Admitting: Medical

## 2011-11-12 NOTE — Telephone Encounter (Signed)
Patient called and cx 11-12-11 appt and resch for 11-14-11

## 2011-11-14 ENCOUNTER — Other Ambulatory Visit (HOSPITAL_BASED_OUTPATIENT_CLINIC_OR_DEPARTMENT_OTHER): Payer: Medicaid Other | Admitting: Lab

## 2011-11-14 ENCOUNTER — Ambulatory Visit (HOSPITAL_BASED_OUTPATIENT_CLINIC_OR_DEPARTMENT_OTHER): Payer: Medicaid Other | Admitting: Medical

## 2011-11-14 VITALS — BP 120/62 | HR 100 | Temp 97.2°F | Resp 20 | Ht 62.0 in | Wt 282.0 lb

## 2011-11-14 DIAGNOSIS — Z86711 Personal history of pulmonary embolism: Secondary | ICD-10-CM

## 2011-11-14 DIAGNOSIS — D509 Iron deficiency anemia, unspecified: Secondary | ICD-10-CM

## 2011-11-14 DIAGNOSIS — O2230 Deep phlebothrombosis in pregnancy, unspecified trimester: Secondary | ICD-10-CM

## 2011-11-14 DIAGNOSIS — I2699 Other pulmonary embolism without acute cor pulmonale: Secondary | ICD-10-CM

## 2011-11-14 HISTORY — DX: Iron deficiency anemia, unspecified: D50.9

## 2011-11-14 LAB — CBC WITH DIFFERENTIAL (CANCER CENTER ONLY)
BASO#: 0 10*3/uL (ref 0.0–0.2)
EOS%: 1.6 % (ref 0.0–7.0)
Eosinophils Absolute: 0.2 10*3/uL (ref 0.0–0.5)
HGB: 13.1 g/dL (ref 11.6–15.9)
LYMPH%: 22.8 % (ref 14.0–48.0)
MCH: 26.4 pg (ref 26.0–34.0)
MCHC: 33.4 g/dL (ref 32.0–36.0)
MCV: 79 fL — ABNORMAL LOW (ref 81–101)
MONO%: 3.4 % (ref 0.0–13.0)
NEUT#: 8.4 10*3/uL — ABNORMAL HIGH (ref 1.5–6.5)
Platelets: 369 10*3/uL (ref 145–400)

## 2011-11-14 LAB — IRON AND TIBC
%SAT: 9 % — ABNORMAL LOW (ref 20–55)
Iron: 25 ug/dL — ABNORMAL LOW (ref 42–145)

## 2011-11-14 NOTE — Progress Notes (Signed)
Diagnoses: #1 recurrent pulmonary embolism. #2 history of iron deficiency anemia.  Current therapy: Arixtra 10 mg subcutaneous daily.  Interim history: Virginia Fitzgerald presents today for an office followup visit.  Overall, she, reports, that she's doing quite well.  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  She remains on Arixtra and is doing quite well with this.  She does not report any type of problems.  The last, time, she was here she was reporting some shortness of breath.  We did do a CT angiogram, which was negative for a pulmonary embolism.  She's not reported any bleeding.  She does have a Mirena IUD and does not have cycles.  She denies any nausea, vomiting, diarrhea, constipation, any cough, chest pain, fevers, chills, or night sweats.  She denies any headaches, visual changes, or rashes.  In terms of her iron deficiency, when she was here back in July.  Her.  Iron was 24, with 8 percent saturation.  Our office called her to come in for IV iron: however, she ended up rescheduling that appointment.  I did give her an opportunity to have IV iron today, however, she reports she is in a, hurry, and will have to reschedule it.  Review of Systems: Pt. Denies any changes in their vision, hearing, adenopathy, fevers, chills, nausea, vomiting, diarrhea, constipation, chest pain, shortness of breath, passing blood, passing out, blacking out,  any changes in skin, joints, neurologic or psychiatric except as noted.  Physical Exam: This is an obese, 26 yo [place] American, female, in no obvious distress Vitals: Temperature 97.2 degrees.  Pulse 100, respirations 20, blood pressure 120/62.  Weight 282 pounds HEENT reveals a normocephalic, atraumatic skull, no scleral icterus, no oral lesions  Neck is supple without any cervical or supraclavicular adenopathy.  Lungs are clear to auscultation bilaterally. There are no wheezes, rales or rhonci Cardiac is regular rate and rhythm with a normal S1 and S2.  There are no murmurs, rubs, or bruits.  Abdomen is soft with good bowel sounds, there is no palpable mass. There is no palpable hepatosplenomegaly. There is no palpable fluid wave.  Musculoskeletal no tenderness of the spine, ribs, or hips.  Extremities there are no clubbing, cyanosis, or edema.  Skin no petechia, purpura or ecchymosis Neurologic is nonfocal.  Laboratory Data: White count 11.7, hemoglobin 13.1, hematocrit 39.2, platelets 369,000  Current Outpatient Prescriptions on File Prior to Visit  Medication Sig Dispense Refill  . fondaparinux (ARIXTRA) 10 MG/0.8ML SOLN Inject 0.8 mLs (10 mg total) into the skin daily.  30 Syringe  0   Assessment/Plan: This is a 26 year old, African American, female, with the following issues:  #1 recurrent pulmonary emboli.  This is idiopathic.  She has had extensive workup, to date.  So far nothing has been found to be positive for a thrombophilic condition.  She will remain on her Arixtra.  I did call in one refill for her at [facility].  #2 iron deficiency anemia.  Virginia Fitzgerald does need IV iron.  Again, we have been trying since July to have her come in for IV iron, and she keeps, rescheduling.  Unfortunately, she has to be somewhere, today and cannot receive IV iron.  She said that she would reschedule her IV iron.  I did explain the importance of her receiving the IV iron, and she voiced her understanding.  #3 followup.  Virginia Fitzgerald will follow back up with [name] in about a month, but before then should there  be questions or concerns.

## 2011-11-27 ENCOUNTER — Ambulatory Visit: Payer: Medicaid Other

## 2011-11-27 NOTE — Progress Notes (Signed)
Patient did not show up today for appt.

## 2011-11-28 ENCOUNTER — Telehealth: Payer: Self-pay | Admitting: Hematology & Oncology

## 2011-11-28 NOTE — Telephone Encounter (Signed)
Tried to call pt to see if she wanted to reschedule missed iron infusion. Not sure if I left message. Mailed letter to pt

## 2011-12-26 ENCOUNTER — Ambulatory Visit (HOSPITAL_BASED_OUTPATIENT_CLINIC_OR_DEPARTMENT_OTHER): Payer: Medicaid Other | Admitting: Hematology & Oncology

## 2011-12-26 ENCOUNTER — Other Ambulatory Visit (HOSPITAL_BASED_OUTPATIENT_CLINIC_OR_DEPARTMENT_OTHER): Payer: Medicaid Other

## 2011-12-26 ENCOUNTER — Other Ambulatory Visit: Payer: Medicaid Other | Admitting: Lab

## 2011-12-26 VITALS — BP 112/64 | HR 91 | Temp 98.1°F | Resp 18 | Ht 62.0 in | Wt 284.0 lb

## 2011-12-26 DIAGNOSIS — I2699 Other pulmonary embolism without acute cor pulmonale: Secondary | ICD-10-CM

## 2011-12-26 DIAGNOSIS — D509 Iron deficiency anemia, unspecified: Secondary | ICD-10-CM

## 2011-12-26 DIAGNOSIS — Z86711 Personal history of pulmonary embolism: Secondary | ICD-10-CM

## 2011-12-26 LAB — CBC WITH DIFFERENTIAL (CANCER CENTER ONLY)
BASO#: 0 10*3/uL (ref 0.0–0.2)
Eosinophils Absolute: 0.2 10*3/uL (ref 0.0–0.5)
HCT: 38.1 % (ref 34.8–46.6)
HGB: 12.4 g/dL (ref 11.6–15.9)
LYMPH#: 2.2 10*3/uL (ref 0.9–3.3)
LYMPH%: 20.3 % (ref 14.0–48.0)
MCV: 81 fL (ref 81–101)
MONO#: 0.6 10*3/uL (ref 0.1–0.9)
NEUT%: 72.2 % (ref 39.6–80.0)
RDW: 15.1 % (ref 11.1–15.7)
WBC: 11 10*3/uL — ABNORMAL HIGH (ref 3.9–10.0)

## 2011-12-26 LAB — IRON AND TIBC
%SAT: 11 % — ABNORMAL LOW (ref 20–55)
TIBC: 312 ug/dL (ref 250–470)
UIBC: 279 ug/dL (ref 125–400)

## 2011-12-26 MED ORDER — RIVAROXABAN 20 MG PO TABS: 20.0000 mg | ORAL_TABLET | Freq: Every day | ORAL | Status: DC

## 2011-12-26 NOTE — Progress Notes (Signed)
This office note has been dictated.

## 2011-12-27 NOTE — Progress Notes (Signed)
DIAGNOSES: 1. Recurrent pulmonary embolism. 2. Intermittent iron-deficiency anemia.  CURRENT THERAPY:  The patient to start Xarelto 20 mg p.o. daily (the patient had been on Arixtra 10 mg subcu daily).  INTERIM HISTORY:  Virginia Fitzgerald comes in for her followup.  She is actually doing okay.  She works at [employer] down close to where I live.  I will have to go down there to see her.  She has had no issues as far as I can tell.  She has not had any problems with pulmonary embolism or DVT for a couple years.  I think we can get her on Xarelto now.  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  I think this would make life a lot easier for her.  She is still going to school at [employer].  She had Dopplers done back in May.  Everything looked okay with no DVT in the legs.  She does not have menstrual cycles.  She does have a Mirena IUD, which I think is okay regarding hypercoagulability.  PHYSICAL EXAMINATION:  General:  This is an obese black female in no obvious distress.  Vital signs:  Temperature of 98.6, pulse 91, respiratory rate 18, blood pressure 112/64.  Weight is 284.  Head and neck:  Normocephalic, atraumatic skull.  There are no ocular or oral lesions.  There are no palpable cervical or supraclavicular lymph nodes. Lungs:  Clear bilaterally.  Cardiac:  Regular rate and rhythm with a normal S1 and S2.  There are no murmurs, rubs, or bruits.  Abdomen: Soft, obese.  She has good bowel sounds.  There is no fluid wave.  There is no palpable hepatosplenomegaly.  Extremities:  No clubbing, cyanosis, or edema.  No venous cords are noted in her legs.  She has good range of motion of her joints.  Skin:  No rashes, ecchymosis, or petechia. Neurological:  No focal neurological deficits.  LABORATORY STUDIES:  White cell count is 11, hemoglobin 12.4, hematocrit 38.1, platelet count 355.  IMPRESSION:  Virginia Fitzgerald is a 26 year old African female with a history of  recurrent pulmonary emboli.  Complete hypercoagulable studies have been done.  Everything so far has come back normal.  I do think that she would be a good candidate for Xarelto.  I do not see any contraindication to her being on Xarelto.  There is no problem with her kidney function from what I can tell with her last lab work.  We will get her on Xarelto.  It should only cost her 3 dollars with her Medicaid.  I do want to get her back to see [name] in another couple months.  I want to make sure that everything is okay with Xarelto.    ______________________________ Josph Macho, M.D. PRE/MEDQ  D:  12/26/2011  T:  12/27/2011  Job:  [id number]

## 2012-01-25 ENCOUNTER — Other Ambulatory Visit: Payer: Medicaid Other | Admitting: Lab

## 2012-01-25 ENCOUNTER — Ambulatory Visit: Payer: Medicaid Other | Admitting: Medical

## 2012-01-25 ENCOUNTER — Other Ambulatory Visit: Payer: Self-pay | Admitting: Oncology

## 2012-01-25 ENCOUNTER — Telehealth: Payer: Self-pay | Admitting: Hematology & Oncology

## 2012-01-25 DIAGNOSIS — Z86711 Personal history of pulmonary embolism: Secondary | ICD-10-CM

## 2012-01-25 MED ORDER — RIVAROXABAN 20 MG PO TABS: 20.0000 mg | ORAL_TABLET | Freq: Every day | ORAL | Status: DC

## 2012-01-25 NOTE — Telephone Encounter (Signed)
Pt cx 12-27 rescheduled for 12-30

## 2012-01-28 ENCOUNTER — Ambulatory Visit (HOSPITAL_BASED_OUTPATIENT_CLINIC_OR_DEPARTMENT_OTHER): Payer: Medicaid Other | Admitting: Medical

## 2012-01-28 ENCOUNTER — Other Ambulatory Visit (HOSPITAL_BASED_OUTPATIENT_CLINIC_OR_DEPARTMENT_OTHER): Payer: Medicaid Other | Admitting: Lab

## 2012-01-28 VITALS — BP 122/65 | HR 95 | Temp 97.6°F | Resp 18 | Ht 62.0 in | Wt 278.0 lb

## 2012-01-28 DIAGNOSIS — Z86711 Personal history of pulmonary embolism: Secondary | ICD-10-CM

## 2012-01-28 DIAGNOSIS — D509 Iron deficiency anemia, unspecified: Secondary | ICD-10-CM

## 2012-01-28 LAB — CBC WITH DIFFERENTIAL (CANCER CENTER ONLY)
BASO#: 0 10*3/uL (ref 0.0–0.2)
Eosinophils Absolute: 0.4 10*3/uL (ref 0.0–0.5)
HGB: 13 g/dL (ref 11.6–15.9)
LYMPH#: 2 10*3/uL (ref 0.9–3.3)
MCH: 26.1 pg (ref 26.0–34.0)
MONO%: 4.5 % (ref 0.0–13.0)
NEUT#: 7.7 10*3/uL — ABNORMAL HIGH (ref 1.5–6.5)
Platelets: 347 10*3/uL (ref 145–400)
RBC: 4.98 10*6/uL (ref 3.70–5.32)
WBC: 10.6 10*3/uL — ABNORMAL HIGH (ref 3.9–10.0)

## 2012-01-28 LAB — FERRITIN: Ferritin: 94 ng/mL (ref 10–291)

## 2012-01-28 LAB — IRON AND TIBC
%SAT: 10 % — ABNORMAL LOW (ref 20–55)
Iron: 33 ug/dL — ABNORMAL LOW (ref 42–145)
UIBC: 283 ug/dL (ref 125–400)

## 2012-01-28 NOTE — Progress Notes (Signed)
Diagnoses: #1 recurrent pulmonary embolism. #2.  Intermittent iron deficiency anemia.  Current therapy: Xarelto 20mg  po daily.  (The patient had been on Arixtra 10 mg subcutaneous daily.).  Interim history: Virginia Fitzgerald presents today for an office followup visit.  Overall, she is doing quite well.  She's not reporting any new polyps or new medications.  She's not had any more problems with pulmonary embolism or DVT, now for a couple of years.  She is tolerating the Xarelto quite well.  She does not report any bleeding symptoms.  She has a good appetite.  She denies any nausea, vomiting, diarrhea, constipation, chest pain, shortness of breath, or cough.  She denies any fevers, chills, or night sweats she denies any obvious, or abnormal bleeding.  She denies any headaches, visual changes, or rashes.  She denies any lower leg swelling.  She had Dopplers done back in may.  Everything looked good without any evidence of DVT in the legs.  Review of Systems: Constitutional:Negative for malaise/fatigue, fever, chills, weight loss, diaphoresis, activity change, appetite change, and unexpected weight change.  HEENT: Negative for double vision, blurred vision, visual loss, ear pain, tinnitus, congestion, rhinorrhea, epistaxis sore throat or sinus disease, oral pain/lesion, tongue soreness Respiratory: Negative for cough, chest tightness, shortness of breath, wheezing and stridor.  Cardiovascular: Negative for chest pain, palpitations, leg swelling, orthopnea, PND, DOE or claudication Gastrointestinal: Negative for nausea, vomiting, abdominal pain, diarrhea, constipation, blood in stool, melena, hematochezia, abdominal distention, anal bleeding, rectal pain, anorexia and hematemesis.  Genitourinary: Negative for dysuria, frequency, hematuria,  Musculoskeletal: Negative for myalgias, back pain, joint swelling, arthralgias and gait problem.  Skin: Negative for rash, color change, pallor and wound.    Neurological:. Negative for dizziness/light-headedness, tremors, seizures, syncope, facial asymmetry, speech difficulty, weakness, numbness, headaches and paresthesias.  Hematological: Negative for adenopathy. Does not bruise/bleed easily.  Psychiatric/Behavioral:  Negative for depression, no loss of interest in normal activity or change in sleep pattern.   Physical Exam: This is a 26, showed, obese, American female, no obvious distress Vitals: Temperature 97.6 degrees, pulse 95, respirations 18, blood pressure 120/65.  Weight 278 pounds HEENT reveals a normocephalic, atraumatic skull, no scleral icterus, no oral lesions  Neck is supple without any cervical or supraclavicular adenopathy.  Lungs are clear to auscultation bilaterally. There are no wheezes, rales or rhonci Cardiac is regular rate and rhythm with a normal S1 and S2. There are no murmurs, rubs, or bruits.  Abdomen is soft with good bowel sounds, there is no palpable mass. There is no palpable hepatosplenomegaly. There is no palpable fluid wave.  Musculoskeletal no tenderness of the spine, ribs, or hips.  Extremities there are no clubbing, cyanosis, or edema.  Skin no petechia, purpura or ecchymosis Neurologic is nonfocal.  Laboratory Data: White count 2.6, hemoglobin 13.0, hematocrit 39.9, MCV is 80, platelets 347,000  Current Outpatient Prescriptions on File Prior to Visit  Medication Sig Dispense Refill  . Rivaroxaban (XARELTO) 20 MG TABS Take 1 tablet (20 mg total) by mouth daily.  30 tablet  12   Assessment/Plan: This is a pleasant, 26.  Her left, American female, with the following issues:  #1 recurrent pulmonary emboli.  Complete hypercoagulable studies were all normal.  She remains on Xarelto without any complications.  We will continue to monitor her every couple of months.  #2.  Intermittent iron deficiency anemia.  We are checking an iron panel on her today.  We will bring her in for IV iron if need be.   #  3.   Followup.  We will follow back up with Virginia Fitzgerald in a couple months, but before then should there be questions or concerns.

## 2012-02-07 ENCOUNTER — Emergency Department (HOSPITAL_COMMUNITY)
Admission: EM | Admit: 2012-02-07 | Discharge: 2012-02-07 | Disposition: A | Discharge status: Home / Self Care | Payer: Medicaid Other | Source: Home / Self Care | Attending: Emergency Medicine | Admitting: Emergency Medicine

## 2012-02-07 ENCOUNTER — Other Ambulatory Visit (HOSPITAL_COMMUNITY)
Admission: RE | Admit: 2012-02-07 | Discharge: 2012-02-07 | Disposition: A | Discharge status: Home / Self Care | Payer: Medicaid Other | Source: Ambulatory Visit | Attending: Emergency Medicine | Admitting: Emergency Medicine

## 2012-02-07 ENCOUNTER — Encounter (HOSPITAL_COMMUNITY): Payer: Self-pay | Admitting: Emergency Medicine

## 2012-02-07 DIAGNOSIS — Z113 Encounter for screening for infections with a predominantly sexual mode of transmission: Secondary | ICD-10-CM | POA: Insufficient documentation

## 2012-02-07 DIAGNOSIS — N760 Acute vaginitis: Secondary | ICD-10-CM

## 2012-02-07 DIAGNOSIS — K5289 Other specified noninfective gastroenteritis and colitis: Secondary | ICD-10-CM

## 2012-02-07 DIAGNOSIS — K529 Noninfective gastroenteritis and colitis, unspecified: Secondary | ICD-10-CM

## 2012-02-07 LAB — POCT URINALYSIS DIP (DEVICE)
Nitrite: NEGATIVE
Protein, ur: 30 mg/dL — AB
Specific Gravity, Urine: 1.025 (ref 1.005–1.030)
Urobilinogen, UA: 0.2 mg/dL (ref 0.0–1.0)

## 2012-02-07 LAB — POCT I-STAT, CHEM 8
BUN: 11 mg/dL (ref 6–23)
Calcium, Ion: 1.11 mmol/L — ABNORMAL LOW (ref 1.12–1.23)
Creatinine, Ser: 0.9 mg/dL (ref 0.50–1.10)
Hemoglobin: 16.3 g/dL — ABNORMAL HIGH (ref 12.0–15.0)
TCO2: 26 mmol/L (ref 0–100)

## 2012-02-07 LAB — POCT PREGNANCY, URINE: Preg Test, Ur: NEGATIVE

## 2012-02-07 MED ORDER — ONDANSETRON 8 MG PO TBDP: 8.0000 mg | ORAL_TABLET | Freq: Three times a day (TID) | ORAL | Status: DC | PRN

## 2012-02-07 MED ORDER — DIPHENOXYLATE-ATROPINE 2.5-0.025 MG PO TABS: 1.0000 | ORAL_TABLET | Freq: Four times a day (QID) | ORAL | Status: DC | PRN

## 2012-02-07 MED ORDER — ONDANSETRON 4 MG PO TBDP
ORAL_TABLET | ORAL | Status: AC
  Filled 2012-02-07: qty 2

## 2012-02-07 MED ORDER — ONDANSETRON 4 MG PO TBDP
8.0000 mg | ORAL_TABLET | Freq: Once | ORAL | Status: AC
  Administered 2012-02-07: 8 mg via ORAL

## 2012-02-07 MED ORDER — FLUCONAZOLE 150 MG PO TABS: 150.0000 mg | ORAL_TABLET | Freq: Once | ORAL | Status: DC

## 2012-02-07 NOTE — ED Provider Notes (Signed)
Chief Complaint  Patient presents with  . Vaginal Discharge    History of Present Illness:   The patient is a 27 year old female who has a history since last night of nausea, vomiting, and diarrhea. Her son had the same thing. She felt nauseated last night and vomited 3 times today. The vomitus was both green and clear-colored. There was no blood or coffee ground emesis. She has had watery diarrhea 3 times last night and 4 times today. There was no blood in the stool. She denies any abdominal pain or cramping. She felt some hot flashes but no definite fever or chills. She feels weak and dizzy. She denies any suspicious ingestions, foreign travel, or animal exposure.  She also had a two-week history of vaginal itching, burning, and a white discharge. She notes slight urinary frequency, urgency, and dysuria. She has a Mirena IUD. She also takes Zarelto for pulmonary embolus.  Review of Systems:  Other than noted above, the patient denies any of the following symptoms: Systemic:  No fevers, chills, sweats, weight loss or gain, fatigue, or tiredness. ENT:  No nasal congestion, rhinorrhea, or sore throat. Lungs:  No cough, wheezing, or shortness of breath. Cardiac:  No chest pain, syncope, or presyncope. GI:  No abdominal pain, nausea, vomiting, anorexia, diarrhea, constipation, blood in stool or vomitus. GU:  No dysuria, frequency, or urgency.  PMFSH:  Past medical history, family history, social history, meds, and allergies were reviewed.  Physical Exam:   Vital signs:  BP 133/100  Pulse 112  Temp 97.2 F (36.2 C) (Oral)  Resp 16  SpO2 97% Filed Vitals:   02/07/12 1241 02/07/12 1330 Supine  02/07/12 1331 Sitting  02/07/12 1337 Standing   BP: 108/82 137/84 138/96 133/100  Pulse: 96 96 108 112  Temp: 97.2 F (36.2 C)     TempSrc: Oral     Resp: 16     SpO2: 97%      General:  Alert and oriented.  In no distress.  Skin warm and dry.  Good skin turgor, brisk capillary refill. ENT:   No scleral icterus, moist mucous membranes, no oral lesions, pharynx clear. Lungs:  Breath sounds clear and equal bilaterally.  No wheezes, rales, or rhonchi. Heart:  Rhythm regular, without extrasystoles.  No gallops or murmers. Abdomen:  Soft, flat, nontender, nondistended. No tenderness, guarding, or rebound. No organomegaly or mass. Bowel sounds are hyperactive. Pelvic: Normal external genitalia. Vaginal and cervical mucosa were normal. There is no vaginal discharge. She has a small amount of mucoid discharge coming from the cervical os and an IUD string is in place and visible. There is no pain on cervical motion. Uterus is normal in size and is nontender. There no adnexal masses or tenderness. Skin: Clear, warm, and dry.  Good turgor.  Brisk capillary refill.  Labs:   Results for orders placed during the hospital encounter of 02/07/12  POCT URINALYSIS DIP (DEVICE)      Component Value Range   Glucose, UA NEGATIVE  NEGATIVE mg/dL   Bilirubin Urine SMALL (*) NEGATIVE   Ketones, ur NEGATIVE  NEGATIVE mg/dL   Specific Gravity, Urine 1.025  1.005 - 1.030   Hgb urine dipstick TRACE (*) NEGATIVE   pH 5.5  5.0 - 8.0   Protein, ur 30 (*) NEGATIVE mg/dL   Urobilinogen, UA 0.2  0.0 - 1.0 mg/dL   Nitrite NEGATIVE  NEGATIVE   Leukocytes, UA TRACE (*) NEGATIVE  POCT PREGNANCY, URINE      Component  Value Range   Preg Test, Ur NEGATIVE  NEGATIVE  POCT I-STAT, CHEM 8      Component Value Range   Sodium 142  135 - 145 mEq/L   Potassium 4.2  3.5 - 5.1 mEq/L   Chloride 107  96 - 112 mEq/L   BUN 11  6 - 23 mg/dL   Creatinine, Ser 0.34  0.50 - 1.10 mg/dL   Glucose, Bld 93  70 - 99 mg/dL   Calcium, Ion 7.42 (*) 1.12 - 1.23 mmol/L   TCO2 26  0 - 100 mmol/L   Hemoglobin 16.3 (*) 12.0 - 15.0 g/dL   HCT 59.5 (*) 63.8 - 75.6 %    Other Labs Obtained at Urgent Care Center:  DNA probes for gonorrhea, Chlamydia, Trichomonas, Gardnerella, and Candida were obtained.  Results are pending at this time and we  will call about any positive results.  Course in Urgent Care Center:   She was given Zofran ODT 8 mg sublingually. Thereafter she was allowed to drink some fluids and kept this down well without any vomiting or nausea.  Assessment:  The primary encounter diagnosis was Gastroenteritis. A diagnosis of Vaginitis was also pertinent to this visit.  Plan:   1.  The following meds were prescribed:   New Prescriptions   DIPHENOXYLATE-ATROPINE (LOMOTIL) 2.5-0.025 MG PER TABLET    Take 1 tablet by mouth 4 (four) times daily as needed for diarrhea or loose stools.   FLUCONAZOLE (DIFLUCAN) 150 MG TABLET    Take 1 tablet (150 mg total) by mouth once.   ONDANSETRON (ZOFRAN ODT) 8 MG DISINTEGRATING TABLET    Take 1 tablet (8 mg total) by mouth every 8 (eight) hours as needed for nausea.   2.  The patient was instructed in symptomatic care and handouts were given. 3.  The patient was told to return if becoming worse in any way, if no better in 2 or 3 days, and given some red flag symptoms that would indicate earlier return. 4.  The patient was told to take only sips of clear liquids for the next 24 hours and then advance to a b.r.a.t. Diet.      Reuben Likes, MD 02/07/12 [phone]

## 2012-02-07 NOTE — ED Notes (Addendum)
Pt c/o vag discharge x2 weeks Sx include: dysuria, white discharge, vag itching, back pain and diarrhea Began to vomit and feel nauseas yest Denies: fevers  She is alert w/no signs of acute distress.

## 2012-03-21 ENCOUNTER — Telehealth: Payer: Self-pay | Admitting: Hematology & Oncology

## 2012-03-21 NOTE — Telephone Encounter (Signed)
Pt aware of 2-24 time change

## 2012-03-24 ENCOUNTER — Other Ambulatory Visit (HOSPITAL_BASED_OUTPATIENT_CLINIC_OR_DEPARTMENT_OTHER): Payer: Medicaid Other | Admitting: Lab

## 2012-03-24 ENCOUNTER — Other Ambulatory Visit: Payer: Self-pay | Admitting: Medical

## 2012-03-24 ENCOUNTER — Ambulatory Visit: Payer: Medicaid Other | Admitting: Hematology & Oncology

## 2012-03-24 ENCOUNTER — Other Ambulatory Visit: Payer: Medicaid Other | Admitting: Lab

## 2012-03-24 ENCOUNTER — Ambulatory Visit (HOSPITAL_BASED_OUTPATIENT_CLINIC_OR_DEPARTMENT_OTHER): Payer: Medicaid Other | Admitting: Medical

## 2012-03-24 VITALS — BP 124/75 | HR 91 | Temp 98.3°F | Resp 16 | Ht 62.0 in | Wt 279.0 lb

## 2012-03-24 DIAGNOSIS — I2699 Other pulmonary embolism without acute cor pulmonale: Secondary | ICD-10-CM

## 2012-03-24 DIAGNOSIS — D509 Iron deficiency anemia, unspecified: Secondary | ICD-10-CM

## 2012-03-24 DIAGNOSIS — Z86711 Personal history of pulmonary embolism: Secondary | ICD-10-CM

## 2012-03-24 LAB — CBC WITH DIFFERENTIAL (CANCER CENTER ONLY)
BASO%: 0.2 % (ref 0.0–2.0)
EOS%: 1.6 % (ref 0.0–7.0)
MCH: 25.9 pg — ABNORMAL LOW (ref 26.0–34.0)
MCHC: 32.5 g/dL (ref 32.0–36.0)
MONO%: 4.7 % (ref 0.0–13.0)
NEUT#: 8.1 10*3/uL — ABNORMAL HIGH (ref 1.5–6.5)
Platelets: 328 10*3/uL (ref 145–400)

## 2012-03-24 LAB — IRON AND TIBC
Iron: 33 ug/dL — ABNORMAL LOW (ref 42–145)
UIBC: 279 ug/dL (ref 125–400)

## 2012-03-24 LAB — FERRITIN: Ferritin: 78 ng/mL (ref 10–291)

## 2012-03-24 MED ORDER — RIVAROXABAN 20 MG PO TABS: 20.0000 mg | ORAL_TABLET | Freq: Every day | ORAL | Status: DC

## 2012-03-24 NOTE — Progress Notes (Signed)
Diagnoses: #1 recurrent pulmonary embolism. #2.  Intermittent iron deficiency anemia.  Current therapy: Xarelto 20mg  po daily.  (The patient had been on Arixtra 10 mg subcutaneous daily.).  Interim history: Virginia Fitzgerald presents today for an office followup visit.  Overall, she is doing quite well.  She's not reporting any new polyps or new medications.  She's not had any more problems with pulmonary embolism or DVT, now for a couple of years.  She is tolerating the Xarelto quite well.  She does not report any bleeding symptoms.  She has a good appetite.  She denies any nausea, vomiting, diarrhea, constipation, chest pain, shortness of breath, or cough.  She denies any fevers, chills, or night sweats she denies any obvious, or abnormal bleeding.  She denies any headaches, visual changes, or rashes.  She denies any lower leg swelling.  She had Dopplers done back in may.  Everything looked good without any evidence of DVT in the legs.  She has had some issues with iron deficiency anemia.  We are checking an iron panel on her today.  She has had IV iron in the past.  Review of Systems: Constitutional:Negative for malaise/fatigue, fever, chills, weight loss, diaphoresis, activity change, appetite change, and unexpected weight change.  HEENT: Negative for double vision, blurred vision, visual loss, ear pain, tinnitus, congestion, rhinorrhea, epistaxis sore throat or sinus disease, oral pain/lesion, tongue soreness Respiratory: Negative for cough, chest tightness, shortness of breath, wheezing and stridor.  Cardiovascular: Negative for chest pain, palpitations, leg swelling, orthopnea, PND, DOE or claudication Gastrointestinal: Negative for nausea, vomiting, abdominal pain, diarrhea, constipation, blood in stool, melena, hematochezia, abdominal distention, anal bleeding, rectal pain, anorexia and hematemesis.  Genitourinary: Negative for dysuria, frequency, hematuria,  Musculoskeletal: Negative for  myalgias, back pain, joint swelling, arthralgias and gait problem.  Skin: Negative for rash, color change, pallor and wound.  Neurological:. Negative for dizziness/light-headedness, tremors, seizures, syncope, facial asymmetry, speech difficulty, weakness, numbness, headaches and paresthesias.  Hematological: Negative for adenopathy. Does not bruise/bleed easily.  Psychiatric/Behavioral:  Negative for depression, no loss of interest in normal activity or change in sleep pattern.   Physical Exam: This is a 63,-year-old, obese, American female, no obvious distress Vitals: Temperature 90.3 degrees, pulse 91, respirations 16, blood pressure 124/75.  Weight 279 pounds HEENT reveals a normocephalic, atraumatic skull, no scleral icterus, no oral lesions  Neck is supple without any cervical or supraclavicular adenopathy.  Lungs are clear to auscultation bilaterally. There are no wheezes, rales or rhonci Cardiac is regular rate and rhythm with a normal S1 and S2. There are no murmurs, rubs, or bruits.  Abdomen is soft with good bowel sounds, there is no palpable mass. There is no palpable hepatosplenomegaly. There is no palpable fluid wave.  Musculoskeletal no tenderness of the spine, ribs, or hips.  Extremities there are no clubbing, cyanosis, or edema.  Skin no petechia, purpura or ecchymosis Neurologic is nonfocal.  Laboratory Data: White count 11.1, hemoglobin 12.5, hematocrit 38.5.  Platelets 328,000  Current Outpatient Prescriptions on File Prior to Visit  Medication Sig Dispense Refill  . diphenoxylate-atropine (LOMOTIL) 2.5-0.025 MG per tablet Take 1 tablet by mouth 4 (four) times daily as needed for diarrhea or loose stools.  16 tablet  0  . fluconazole (DIFLUCAN) 150 MG tablet Take 1 tablet (150 mg total) by mouth once.  1 tablet  0  . ondansetron (ZOFRAN ODT) 8 MG disintegrating tablet Take 1 tablet (8 mg total) by mouth every 8 (eight) hours as needed  for nausea.  20 tablet  0  .  Rivaroxaban (XARELTO) 20 MG TABS Take 1 tablet (20 mg total) by mouth daily.  30 tablet  12   No current facility-administered medications on file prior to visit.   Assessment/Plan: This is a pleasant, 26.  Her left, American female, with the following issues:  #1 recurrent pulmonary emboli.  Complete hypercoagulable studies were all normal.  She remains on Xarelto without any complications.  We will continue to monitor her every couple of months.  #2.  Intermittent iron deficiency anemia.  We are checking an iron panel on her today.  We will bring her in for IV iron if need be.   #3.  Followup.  We will follow back up with Virginia Fitzgerald in a couple months, but before then should there be questions or concerns.

## 2012-03-28 ENCOUNTER — Telehealth: Payer: Self-pay | Admitting: *Deleted

## 2012-03-28 NOTE — Telephone Encounter (Signed)
Message copied by Anselm Jungling on Fri Mar 28, 2012  1:09 PM ------      Message from: Josph Macho      Created: Tue Mar 25, 2012  8:19 PM       Call - iron is dropping.  Need IV Feraheme 1020mg  x 1 dose. Please set up fro 1-2 weeks.  Pete ------

## 2012-03-28 NOTE — Telephone Encounter (Signed)
Message copied by Anselm Jungling on Fri Mar 28, 2012  1:05 PM ------      Message from: Josph Macho      Created: Tue Mar 25, 2012  8:19 PM       Call - iron is dropping.  Need IV Feraheme 1020mg  x 1 dose. Please set up fro 1-2 weeks.  Pete ------

## 2012-03-28 NOTE — Telephone Encounter (Signed)
Have attempted to call patient numerous times about Dr. Jolly Mango message for patient to receive IV iron due to low iron levels.  Have been unable to leave message.  Left message for patients mother to call us back regarding lab results

## 2012-04-11 ENCOUNTER — Telehealth: Payer: Self-pay | Admitting: *Deleted

## 2012-04-11 NOTE — Telephone Encounter (Addendum)
Message copied by Mirian Capuchin on Fri Apr 11, 2012  4:11 PM ------      Message from: Josph Macho      Created: Tue Mar 25, 2012  8:19 PM       Call - iron is dropping.  Need IV Feraheme 1020mg  x 1 dose. Please set up fro 1-2 weeks.  Pete ------Called and spoke with pt's grandmother.  Gave her the above message and asked that pt call and schedule a time and date to have this done.  Grandmother voiced understanding.

## 2012-04-18 ENCOUNTER — Encounter: Payer: Self-pay | Admitting: *Deleted

## 2012-04-25 ENCOUNTER — Emergency Department (HOSPITAL_COMMUNITY)
Admission: EM | Admit: 2012-04-25 | Discharge: 2012-04-25 | Disposition: A | Discharge status: Home / Self Care | Payer: Medicaid Other | Attending: Emergency Medicine | Admitting: Emergency Medicine

## 2012-04-25 ENCOUNTER — Encounter (HOSPITAL_COMMUNITY): Payer: Self-pay

## 2012-04-25 DIAGNOSIS — Z86718 Personal history of other venous thrombosis and embolism: Secondary | ICD-10-CM | POA: Insufficient documentation

## 2012-04-25 DIAGNOSIS — Z862 Personal history of diseases of the blood and blood-forming organs and certain disorders involving the immune mechanism: Secondary | ICD-10-CM | POA: Insufficient documentation

## 2012-04-25 DIAGNOSIS — N751 Abscess of Bartholin's gland: Secondary | ICD-10-CM | POA: Insufficient documentation

## 2012-04-25 DIAGNOSIS — Z9889 Other specified postprocedural states: Secondary | ICD-10-CM | POA: Insufficient documentation

## 2012-04-25 DIAGNOSIS — Z7901 Long term (current) use of anticoagulants: Secondary | ICD-10-CM | POA: Insufficient documentation

## 2012-04-25 LAB — POCT I-STAT, CHEM 8
Calcium, Ion: 1.18 mmol/L (ref 1.12–1.23)
Chloride: 103 mEq/L (ref 96–112)
Glucose, Bld: 112 mg/dL — ABNORMAL HIGH (ref 70–99)
HCT: 40 % (ref 36.0–46.0)
Hemoglobin: 13.6 g/dL (ref 12.0–15.0)
TCO2: 30 mmol/L (ref 0–100)

## 2012-04-25 MED ORDER — OXYCODONE-ACETAMINOPHEN 5-325 MG PO TABS: 1.0000 | ORAL_TABLET | ORAL | Status: DC | PRN

## 2012-04-25 NOTE — ED Provider Notes (Signed)
History     CSN: [id number]  Arrival date & time 04/25/12  5621   First MD Initiated Contact with Patient 04/25/12 [phone]      Chief Complaint  Patient presents with  . Abscess    (Consider location/radiation/quality/duration/timing/severity/associated sxs/prior treatment) HPI  28 year old female presents to the emergency department with chief complaint of abscess to her vaginal area.  The patient states she's had these previously.  She complains of pain.  She denies any other vaginal symptoms.  Patient does not know her last menstrual period as she has Mirena IUD, does not get her menses.  Patient denies any urinary symptoms.  She is monogamous with one female partner but asks to be screened for STDs today.  She has a past medical history of DVT.  She is status post IVC filter and takes Xarelto.  Denies fevers, chills, myalgias, arthralgias. Denies DOE, SOB, chest tightness or pressure, radiation to left arm, jaw or back, or diaphoresis. Denies dysuria, flank pain, suprapubic pain, frequency, urgency, or hematuria. Denies headaches, light headedness, weakness, visual disturbances. Denies abdominal pain, nausea, vomiting, diarrhea or constipation.    Past Medical History  Diagnosis Date  . Anemia   . Thrombocytosis   . S/P IVC filter   . History of DVT of lower extremity     History reviewed. No pertinent past surgical history.  No family history on file.  History  Substance Use Topics  . Smoking status: Never Smoker   . Smokeless tobacco: Not on file  . Alcohol Use: No    OB History   Grav Para Term Preterm Abortions TAB SAB Ect Mult Living                  Review of Systems  Constitutional: Negative for fever and chills.  HENT: Negative for trouble swallowing.   Respiratory: Negative for shortness of breath.   Cardiovascular: Negative for chest pain.  Gastrointestinal: Negative for nausea, vomiting, abdominal pain, diarrhea and constipation.  Genitourinary: Negative  for dysuria and hematuria.  Musculoskeletal: Negative for myalgias and arthralgias.  Skin: Negative for rash.       Abscess  Neurological: Negative for numbness.  All other systems reviewed and are negative.    Allergies  Review of patient's allergies indicates no known allergies.  Home Medications   Current Outpatient Rx  Name  Route  Sig  Dispense  Refill  . Rivaroxaban (XARELTO) 20 MG TABS   Oral   Take 1 tablet (20 mg total) by mouth daily.   30 tablet   12     BP 132/74  Pulse 116  Temp(Src) 97.5 F (36.4 C) (Oral)  Resp 20  SpO2 98%  Physical Exam  Constitutional: She is oriented to person, place, and time. She appears well-developed and well-nourished. No distress.  Morbidly obese  HENT:  Head: Normocephalic and atraumatic.  Eyes: Conjunctivae are normal. No scleral icterus.  Neck: Normal range of motion.  Cardiovascular: Normal rate, regular rhythm and normal heart sounds.  Exam reveals no gallop and no friction rub.   No murmur heard. Pulmonary/Chest: Effort normal and breath sounds normal. No respiratory distress.  Abdominal: Soft. Bowel sounds are normal. She exhibits no distension and no mass. There is no tenderness. There is no guarding.  Genitourinary:    Pelvic exam was performed with patient supine. There is tenderness on the right labia.  Neurological: She is alert and oriented to person, place, and time.  Skin: Skin is warm and dry. She  is not diaphoretic.    ED Course  Procedures (including critical care time)  INCISION AND DRAINAGE Performed by: Arthor Captain Consent: Verbal consent obtained. Risks and benefits: risks, benefits and alternatives were discussed Type: abscess  Body area: Right labia   Anesthesia: local infiltration  Incision was made with a scalpel.  Local anesthetic: lidocaine 2 % with epinephrine  Anesthetic total: 5 ml  Complexity: complex Blunt dissection to break up loculations  Drainage:  purulent  Drainage amount: Copious   Packing material: 1/4 in iodoform gauze  Patient tolerance: Patient tolerated the procedure well with no immediate complications.     Labs Reviewed  POCT I-STAT, CHEM 8 - Abnormal; Notable for the following:    Glucose, Bld 112 (*)    All other components within normal limits     No results found.   1. Bartholin's gland abscess       MDM  The patient with abscess of the Bartholin's gland of the right labia.  I have performed an I&D.  The wound was flushed copiously with sterile saline.  I did pack the wound as there was a large cavity.  The patient did tolerate the procedure well.  I decided to forego the normal pelvic exam with wet prep and GC chlamydia probe as the patient's swelling and induration of the right labia are very great and she is extremely tender.  Have the patient follow up at [facility] for wound check within 48 hours        Arthor Captain, PA-C 04/28/12 1934

## 2012-04-25 NOTE — ED Notes (Addendum)
ABscess to her Rt. Vaginal area.  Has not begun to drain, Hx of them

## 2012-04-28 LAB — WOUND CULTURE: Culture: NO GROWTH

## 2012-04-28 NOTE — ED Provider Notes (Signed)
Medical screening examination/treatment/procedure(s) were performed by non-physician practitioner and as supervising physician I was immediately available for consultation/collaboration.   Shelda Jakes, MD 04/28/12 [phone]

## 2012-06-16 ENCOUNTER — Ambulatory Visit: Payer: Medicaid Other | Admitting: Medical

## 2012-06-16 ENCOUNTER — Other Ambulatory Visit: Payer: Medicaid Other | Admitting: Lab

## 2012-06-18 ENCOUNTER — Telehealth: Payer: Self-pay | Admitting: Hematology & Oncology

## 2012-06-18 NOTE — Telephone Encounter (Signed)
Pt made 5-27 appointment from missed 5-19

## 2012-06-24 ENCOUNTER — Ambulatory Visit (HOSPITAL_BASED_OUTPATIENT_CLINIC_OR_DEPARTMENT_OTHER): Payer: Medicaid Other | Admitting: Lab

## 2012-06-24 ENCOUNTER — Ambulatory Visit (HOSPITAL_BASED_OUTPATIENT_CLINIC_OR_DEPARTMENT_OTHER): Payer: Medicaid Other | Admitting: Medical

## 2012-06-24 DIAGNOSIS — Z86711 Personal history of pulmonary embolism: Secondary | ICD-10-CM

## 2012-06-24 DIAGNOSIS — D509 Iron deficiency anemia, unspecified: Secondary | ICD-10-CM

## 2012-06-24 LAB — CBC WITH DIFFERENTIAL (CANCER CENTER ONLY)
BASO#: 0 10*3/uL (ref 0.0–0.2)
Eosinophils Absolute: 0.2 10*3/uL (ref 0.0–0.5)
HCT: 41 % (ref 34.8–46.6)
HGB: 13.3 g/dL (ref 11.6–15.9)
MCH: 26.2 pg (ref 26.0–34.0)
MONO%: 5.1 % (ref 0.0–13.0)
NEUT#: 8.9 10*3/uL — ABNORMAL HIGH (ref 1.5–6.5)
RBC: 5.07 10*6/uL (ref 3.70–5.32)

## 2012-06-24 LAB — IRON AND TIBC
%SAT: 7 % — ABNORMAL LOW (ref 20–55)
Iron: 23 ug/dL — ABNORMAL LOW (ref 42–145)
UIBC: 284 ug/dL (ref 125–400)

## 2012-06-24 NOTE — Progress Notes (Signed)
Diagnoses: #1 recurrent pulmonary embolism. #2.  Intermittent iron deficiency anemia.  Current therapy: Xarelto 20mg  po daily.  (The patient had been on Arixtra 10 mg subcutaneous daily.).  Interim history: Virginia Fitzgerald presents today for an office followup visit.  Overall, she is doing quite well.  She's not reporting any new problems or new medications.  She's not had any more problems with pulmonary embolism or DVT, now for a couple of years.  She is tolerating the Xarelto quite well.  She does not report any bleeding symptoms.  She does report some fatigue.  She's not had IV iron in a couple of years now.  Her last iron panel back in February was low.  Her iron was 33 and 11% saturation.  Her ferritin was 78.  Apparently he hour office called and spoke with her mother he was supposed to relay the message to her.  We never her back from Virginia Fitzgerald.  She is unable to stay today to get IV iron as she has to work.  We will coordinate a good time for her to come in to receive IV iron. She has a good appetite.  She denies any nausea, vomiting, diarrhea, constipation, chest pain, shortness of breath, or cough.  She denies any fevers, chills, or night sweats she denies any obvious, or abnormal bleeding.  She denies any headaches, visual changes, or rashes.  She denies any lower leg swelling.   Review of Systems: Constitutional:Negative for malaise/fatigue, fever, chills, weight loss, diaphoresis, activity change, appetite change, and unexpected weight change.  HEENT: Negative for double vision, blurred vision, visual loss, ear pain, tinnitus, congestion, rhinorrhea, epistaxis sore throat or sinus disease, oral pain/lesion, tongue soreness Respiratory: Negative for cough, chest tightness, shortness of breath, wheezing and stridor.  Cardiovascular: Negative for chest pain, palpitations, leg swelling, orthopnea, PND, DOE or claudication Gastrointestinal: Negative for nausea, vomiting, abdominal pain,  diarrhea, constipation, blood in stool, melena, hematochezia, abdominal distention, anal bleeding, rectal pain, anorexia and hematemesis.  Genitourinary: Negative for dysuria, frequency, hematuria,  Musculoskeletal: Negative for myalgias, back pain, joint swelling, arthralgias and gait problem.  Skin: Negative for rash, color change, pallor and wound.  Neurological:. Negative for dizziness/light-headedness, tremors, seizures, syncope, facial asymmetry, speech difficulty, weakness, numbness, headaches and paresthesias.  Hematological: Negative for adenopathy. Does not bruise/bleed easily.  Psychiatric/Behavioral:  Negative for depression, no loss of interest in normal activity or change in sleep pattern.   Physical Exam: [name] ,-year-old, obese, [name],in no obvious distress Vitals: Temperature 98.2 degrees pulse 99 respirations 18 blood pressure 113/75 weight 279 pounds HEENT reveals a normocephalic, atraumatic skull, no scleral icterus, no oral lesions  Neck is supple without any cervical or supraclavicular adenopathy.  Lungs are clear to auscultation bilaterally. There are no wheezes, rales or rhonci Cardiac is regular rate and rhythm with a normal S1 and S2. There are no murmurs, rubs, or bruits.  Abdomen is soft with good bowel sounds, there is no palpable mass. There is no palpable hepatosplenomegaly. There is no palpable fluid wave.  Musculoskeletal no tenderness of the spine, ribs, or hips.  Extremities there are no clubbing, cyanosis, or edema.  Skin no petechia, purpura or ecchymosis Neurologic is nonfocal.  Laboratory Data: White count 12.1 hemoglobin 13.3 hematocrit 41.0 platelets 363,000 MCV 81  Current Outpatient Prescriptions on File Prior to Visit  Medication Sig Dispense Refill  . Rivaroxaban (XARELTO) 20 MG TABS Take 1 tablet (20 mg total) by mouth daily.  30 tablet  12   No current facility-administered medications on file prior to visit.    Assessment/Plan: This is a pleasant, 27 year old, American female, with the following issues:  #1 recurrent pulmonary emboli.  Complete hypercoagulable studies were all normal.  She remains on Xarelto without any complications.  We will continue to monitor her every couple of months.  #2.  Intermittent iron deficiency anemia.  We are checking an iron panel on her today.  She will most likely need IV iron as her iron was low back and February, and she did not come into our office to receive IV iron..  It is sometimes difficult to get her to come in.     #3.  Followup.  We will follow back up with Virginia Fitzgerald in a couple months, but before then should there be questions or concerns.

## 2012-06-25 ENCOUNTER — Telehealth: Payer: Self-pay | Admitting: Hematology & Oncology

## 2012-06-25 ENCOUNTER — Other Ambulatory Visit: Payer: Self-pay | Admitting: Medical

## 2012-06-25 NOTE — Telephone Encounter (Signed)
Pt aware of 6-2 iron infusion

## 2012-06-30 ENCOUNTER — Ambulatory Visit: Payer: Medicaid Other

## 2012-06-30 ENCOUNTER — Telehealth: Payer: Self-pay | Admitting: Hematology & Oncology

## 2012-06-30 NOTE — Telephone Encounter (Signed)
Pt was no show for 6-2 Iron Infusion I called her and she rescheduled for 07-02-12

## 2012-07-01 ENCOUNTER — Other Ambulatory Visit: Payer: Self-pay | Admitting: *Deleted

## 2012-07-01 DIAGNOSIS — D509 Iron deficiency anemia, unspecified: Secondary | ICD-10-CM

## 2012-07-02 ENCOUNTER — Ambulatory Visit (HOSPITAL_BASED_OUTPATIENT_CLINIC_OR_DEPARTMENT_OTHER): Payer: Medicaid Other

## 2012-07-02 VITALS — BP 126/91 | HR 91 | Temp 96.7°F

## 2012-07-02 DIAGNOSIS — D509 Iron deficiency anemia, unspecified: Secondary | ICD-10-CM

## 2012-07-02 MED ORDER — SODIUM CHLORIDE 0.9 % IV SOLN
1020.0000 mg | Freq: Once | INTRAVENOUS | Status: AC
  Administered 2012-07-02: 1020 mg via INTRAVENOUS
  Filled 2012-07-02: qty 34

## 2012-07-02 MED ORDER — SODIUM CHLORIDE 0.9 % IV SOLN
Freq: Once | INTRAVENOUS | Status: AC
  Administered 2012-07-02: 15:00:00 via INTRAVENOUS

## 2012-07-02 NOTE — Patient Instructions (Signed)
Ferumoxytol injection What is this medicine? FERUMOXYTOL is an iron complex. Iron is used to make healthy red blood cells, which carry oxygen and nutrients throughout the body. This medicine is used to treat iron deficiency anemia in people with chronic kidney disease. This medicine may be used for other purposes; ask your health care provider or pharmacist if you have questions. What should I tell my health care provider before I take this medicine? They need to know if you have any of these conditions: -anemia not caused by low iron levels -high levels of iron in the blood -magnetic resonance imaging (MRI) test scheduled -an unusual or allergic reaction to iron, other medicines, foods, dyes, or preservatives -pregnant or trying to get pregnant -breast-feeding How should I use this medicine? This medicine is for infusion into a vein. It is given by a health care professional in a hospital or clinic setting. Talk to your pediatrician regarding the use of this medicine in children. Special care may be needed. Overdosage: If you think you've taken too much of this medicine contact a poison control center or emergency room at once. Overdosage: If you think you have taken too much of this medicine contact a poison control center or emergency room at once. NOTE: This medicine is only for you. Do not share this medicine with others. What if I miss a dose? It is important not to miss your dose. Call your doctor or health care professional if you are unable to keep an appointment. What may interact with this medicine? This medicine may interact with the following medications: -other iron products This list may not describe all possible interactions. Give your health care provider a list of all the medicines, herbs, non-prescription drugs, or dietary supplements you use. Also tell them if you smoke, drink alcohol, or use illegal drugs. Some items may interact with your medicine. What should I watch  for while using this medicine? Visit your doctor or healthcare professional regularly. Tell your doctor or healthcare professional if your symptoms do not start to get better or if they get worse. You may need blood work done while you are taking this medicine. You may need to follow a special diet. Talk to your doctor. Foods that contain iron include: whole grains/cereals, dried fruits, beans, or peas, leafy green vegetables, and organ meats (liver, kidney). What side effects may I notice from receiving this medicine? Side effects that you should report to your doctor or health care professional as soon as possible: -allergic reactions like skin rash, itching or hives, swelling of the face, lips, or tongue -breathing problems -changes in blood pressure -feeling faint or lightheaded, falls -fever or chills -flushing, sweating, or hot feelings -swelling of the ankles or feet Side effects that usually do not require medical attention (Report these to your doctor or health care professional if they continue or are bothersome.): -diarrhea -headache -nausea, vomiting -stomach pain This list may not describe all possible side effects. Call your doctor for medical advice about side effects. You may report side effects to FDA at 1-800-FDA-1088. Where should I keep my medicine? This drug is given in a hospital or clinic and will not be stored at home. NOTE: This sheet is a summary. It may not cover all possible information. If you have questions about this medicine, talk to your doctor, pharmacist, or health care provider.  2013, [employer]vier/[employer] Standard. (10/08/2007 9:48:25 PM)  

## 2012-09-24 ENCOUNTER — Ambulatory Visit: Payer: Medicaid Other | Admitting: Hematology & Oncology

## 2012-09-24 ENCOUNTER — Other Ambulatory Visit: Payer: Medicaid Other | Admitting: Lab

## 2012-09-25 ENCOUNTER — Other Ambulatory Visit (HOSPITAL_COMMUNITY)
Admission: RE | Admit: 2012-09-25 | Discharge: 2012-09-25 | Disposition: A | Discharge status: Home / Self Care | Payer: Medicaid Other | Source: Ambulatory Visit | Attending: Family Medicine | Admitting: Family Medicine

## 2012-09-25 ENCOUNTER — Encounter (HOSPITAL_COMMUNITY): Payer: Self-pay | Admitting: Emergency Medicine

## 2012-09-25 ENCOUNTER — Emergency Department (HOSPITAL_COMMUNITY)
Admission: EM | Admit: 2012-09-25 | Discharge: 2012-09-25 | Disposition: A | Discharge status: Home / Self Care | Payer: Medicaid Other | Source: Home / Self Care

## 2012-09-25 DIAGNOSIS — Z113 Encounter for screening for infections with a predominantly sexual mode of transmission: Secondary | ICD-10-CM | POA: Insufficient documentation

## 2012-09-25 DIAGNOSIS — N760 Acute vaginitis: Secondary | ICD-10-CM | POA: Insufficient documentation

## 2012-09-25 DIAGNOSIS — Z202 Contact with and (suspected) exposure to infections with a predominantly sexual mode of transmission: Secondary | ICD-10-CM

## 2012-09-25 DIAGNOSIS — N898 Other specified noninflammatory disorders of vagina: Secondary | ICD-10-CM

## 2012-09-25 LAB — POCT URINALYSIS DIP (DEVICE)
Ketones, ur: NEGATIVE mg/dL
Leukocytes, UA: NEGATIVE
Protein, ur: NEGATIVE mg/dL
Urobilinogen, UA: 0.2 mg/dL (ref 0.0–1.0)

## 2012-09-25 LAB — POCT PREGNANCY, URINE: Preg Test, Ur: NEGATIVE

## 2012-09-25 MED ORDER — LIDOCAINE HCL (PF) 1 % IJ SOLN
INTRAMUSCULAR | Status: AC
  Filled 2012-09-25: qty 5

## 2012-09-25 MED ORDER — AZITHROMYCIN 250 MG PO TABS: ORAL_TABLET | ORAL | Status: DC

## 2012-09-25 MED ORDER — CEFTRIAXONE SODIUM 250 MG IJ SOLR
250.0000 mg | Freq: Once | INTRAMUSCULAR | Status: AC
  Administered 2012-09-25: 250 mg via INTRAMUSCULAR

## 2012-09-25 MED ORDER — CEFTRIAXONE SODIUM 250 MG IJ SOLR
INTRAMUSCULAR | Status: AC
  Filled 2012-09-25: qty 250

## 2012-09-25 NOTE — ED Provider Notes (Signed)
CSN: [id number]     Arrival date & time 09/25/12  1201 History   First MD Initiated Contact with Patient 09/25/12 1311     Chief Complaint  Patient presents with  . Exposure to STD   (Consider location/radiation/quality/duration/timing/severity/associated sxs/prior Treatment) HPI Comments: 27 year old obese female states her sexual partner told her that he had a penile discharge. She is uncertain as to whether she has a vaginal discharge or not. Otherwise she has no symptoms. She is here for a STD check.   Past Medical History  Diagnosis Date  . Anemia   . Thrombocytosis   . S/P IVC filter   . History of DVT of lower extremity    History reviewed. No pertinent past surgical history. History reviewed. No pertinent family history. History  Substance Use Topics  . Smoking status: Never Smoker   . Smokeless tobacco: Not on file  . Alcohol Use: No   OB History   Grav Para Term Preterm Abortions TAB SAB Ect Mult Living                 Review of Systems  All other systems reviewed and are negative.    Allergies  Review of patient's allergies indicates no known allergies.  Home Medications   Current Outpatient Rx  Name  Route  Sig  Dispense  Refill  . azithromycin (ZITHROMAX) 250 MG tablet      Take all 4 tablets po stat   4 each   0   . Rivaroxaban (XARELTO) 20 MG TABS   Oral   Take 1 tablet (20 mg total) by mouth daily.   30 tablet   12    BP 127/85  Pulse 90  Temp(Src) 97.3 F (36.3 C) (Oral)  Resp 22  SpO2 97% Physical Exam  Nursing note and vitals reviewed. Constitutional: She is oriented to person, place, and time. She appears well-developed and well-nourished. No distress.  Moderate to severe obesity.  Neck: Normal range of motion. Neck supple.  Cardiovascular: Normal rate.   Pulmonary/Chest: Effort normal and breath sounds normal.  Genitourinary:  Normal external female genitalia. Large body habitus in redundant vaginal walls. Position of cervix  difficult to discern but eventually was captured. The ectocervix is erythematous and inflamed between the 3 and 9:00 positions. There is a light green and bubbly discharge exuding from the cervical os. Bimanual yields no information due to large body habitus.  Neurological: She is alert and oriented to person, place, and time. She exhibits normal muscle tone.  Skin: Skin is warm and dry.  Psychiatric: She has a normal mood and affect.    ED Course  Procedures (including critical care time) Labs Review Labs Reviewed  POCT PREGNANCY, URINE  POCT URINALYSIS DIP (DEVICE)  CERVICOVAGINAL ANCILLARY ONLY   Imaging Review No results found.  MDM   1. Vaginal discharge   2. Exposure to STD    Rocephin 250 mg IM Rx for azithromycin 1 g by mouth Ancillary swabs obtained for STD testing, results pending will call for any additional positives for treatment over the phone.    Hayden Rasmussen, NP 09/25/12 1341  Hayden Rasmussen, NP 09/25/12 [phone]

## 2012-09-25 NOTE — ED Notes (Signed)
C/o std.  Patient states partner states he has discharge.  Patient states she has a little discharge and itching.

## 2012-09-27 NOTE — ED Provider Notes (Signed)
Medical screening examination/treatment/procedure(s) were performed by a resident physician or non-physician practitioner and as the supervising physician I was immediately available for consultation/collaboration.  Clementeen Graham, MD   Rodolph Bong, MD 09/27/12 [phone]

## 2013-02-20 ENCOUNTER — Telehealth: Payer: Self-pay | Admitting: Hematology & Oncology

## 2013-02-20 NOTE — Telephone Encounter (Signed)
Pt called made 2-13 appointments

## 2013-03-13 ENCOUNTER — Telehealth: Payer: Self-pay | Admitting: Hematology & Oncology

## 2013-03-13 ENCOUNTER — Ambulatory Visit: Payer: Medicaid Other | Admitting: Hematology & Oncology

## 2013-03-13 ENCOUNTER — Ambulatory Visit: Payer: Medicaid Other | Admitting: Lab

## 2013-03-13 NOTE — Telephone Encounter (Signed)
Pt called rescheduled 2-13 to 2-27 said she over slept.

## 2013-03-13 NOTE — Progress Notes (Signed)
This patient had an office visit with Dr. Marin Olp  03-13-13 @ 08:00 for which she was a no show. I called patient to re-schedule this appointment, no answer, message left regarding call and for patient to call us back. I will also also re-call patient if she does not call us.

## 2013-03-27 ENCOUNTER — Telehealth: Payer: Self-pay | Admitting: Hematology & Oncology

## 2013-03-27 ENCOUNTER — Ambulatory Visit: Payer: Medicaid Other | Admitting: Hematology & Oncology

## 2013-03-27 ENCOUNTER — Other Ambulatory Visit: Payer: Medicaid Other | Admitting: Lab

## 2013-03-27 NOTE — Telephone Encounter (Signed)
PT MOVED 2-27 TO 3-11

## 2013-04-06 ENCOUNTER — Telehealth: Payer: Self-pay | Admitting: Hematology & Oncology

## 2013-04-06 NOTE — Telephone Encounter (Signed)
CALLED PT 2 TIMES SOMEONE ANSWERED AND HUNG UP. i CALLED BACK 3RD TIME GOT VOICE MAIL LEFT MESSAGE WITH 4-29 APPOINTMENT

## 2013-04-08 ENCOUNTER — Other Ambulatory Visit: Payer: Medicaid Other | Admitting: Lab

## 2013-04-08 ENCOUNTER — Ambulatory Visit: Payer: Medicaid Other | Admitting: Hematology & Oncology

## 2013-04-13 ENCOUNTER — Encounter (HOSPITAL_COMMUNITY): Payer: Self-pay | Admitting: Emergency Medicine

## 2013-04-13 DIAGNOSIS — R109 Unspecified abdominal pain: Secondary | ICD-10-CM

## 2013-04-13 DIAGNOSIS — Z7901 Long term (current) use of anticoagulants: Secondary | ICD-10-CM | POA: Insufficient documentation

## 2013-04-13 DIAGNOSIS — Z86718 Personal history of other venous thrombosis and embolism: Secondary | ICD-10-CM

## 2013-04-13 DIAGNOSIS — Z862 Personal history of diseases of the blood and blood-forming organs and certain disorders involving the immune mechanism: Secondary | ICD-10-CM | POA: Insufficient documentation

## 2013-04-13 DIAGNOSIS — R1013 Epigastric pain: Secondary | ICD-10-CM | POA: Insufficient documentation

## 2013-04-13 DIAGNOSIS — Z3202 Encounter for pregnancy test, result negative: Secondary | ICD-10-CM | POA: Insufficient documentation

## 2013-04-13 DIAGNOSIS — Z9889 Other specified postprocedural states: Secondary | ICD-10-CM | POA: Insufficient documentation

## 2013-04-13 DIAGNOSIS — R110 Nausea: Secondary | ICD-10-CM | POA: Insufficient documentation

## 2013-04-13 LAB — COMPREHENSIVE METABOLIC PANEL
ALBUMIN: 3.5 g/dL (ref 3.5–5.2)
ALK PHOS: 90 U/L (ref 39–117)
ALT: 24 U/L (ref 0–35)
AST: 17 U/L (ref 0–37)
BUN: 10 mg/dL (ref 6–23)
CO2: 25 mEq/L (ref 19–32)
CREATININE: 0.68 mg/dL (ref 0.50–1.10)
Calcium: 9.8 mg/dL (ref 8.4–10.5)
Chloride: 99 mEq/L (ref 96–112)
GFR calc non Af Amer: >90 mL/min (ref >90)
GLUCOSE: 113 mg/dL — AB (ref 70–99)
POTASSIUM: 4.2 meq/L (ref 3.7–5.3)
Sodium: 137 mEq/L (ref 137–147)
TOTAL PROTEIN: 7.4 g/dL (ref 6.0–8.3)
Total Bilirubin: 0.2 mg/dL — ABNORMAL LOW (ref 0.3–1.2)

## 2013-04-13 LAB — CBC WITH DIFFERENTIAL/PLATELET
BASOS PCT: 0 % (ref 0–1)
Basophils Absolute: 0 10*3/uL (ref 0.0–0.1)
EOS ABS: 0.2 10*3/uL (ref 0.0–0.7)
EOS PCT: 1 % (ref 0–5)
HEMATOCRIT: 42.5 % (ref 36.0–46.0)
HEMOGLOBIN: 14.3 g/dL (ref 12.0–15.0)
LYMPHS ABS: 3.1 10*3/uL (ref 0.7–4.0)
Lymphocytes Relative: 20 % (ref 12–46)
MCH: 28.3 pg (ref 26.0–34.0)
MCHC: 33.6 g/dL (ref 30.0–36.0)
MCV: 84 fL (ref 78.0–100.0)
MONO ABS: 0.5 10*3/uL (ref 0.1–1.0)
MONOS PCT: 3 % (ref 3–12)
Neutro Abs: 11.3 10*3/uL — ABNORMAL HIGH (ref 1.7–7.7)
Neutrophils Relative %: 75 % (ref 43–77)
Platelets: 344 10*3/uL (ref 150–400)
RBC: 5.06 MIL/uL (ref 3.87–5.11)
RDW: 13.6 % (ref 11.5–15.5)
WBC: 15 10*3/uL — ABNORMAL HIGH (ref 4.0–10.5)

## 2013-04-13 LAB — I-STAT TROPONIN, ED: TROPONIN I, POC: 0 ng/mL (ref 0.00–0.08)

## 2013-04-13 LAB — PROTIME-INR
INR: 1.14 (ref 0.00–1.49)
PROTHROMBIN TIME: 14.4 s (ref 11.6–15.2)

## 2013-04-13 LAB — APTT: aPTT: 41 seconds — ABNORMAL HIGH (ref 24–37)

## 2013-04-13 NOTE — ED Notes (Addendum)
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 Pt reports she has had a blood clot in her stomach before and this feels the same. VSS, denies SOB. Has missed a some doses of xarelto recently

## 2013-04-14 ENCOUNTER — Emergency Department (HOSPITAL_COMMUNITY)
Admission: EM | Admit: 2013-04-14 | Discharge: 2013-04-14 | Discharge status: Against Medical Advice | Payer: Medicaid Other | Source: Home / Self Care

## 2013-04-14 ENCOUNTER — Emergency Department (HOSPITAL_COMMUNITY)
Admission: EM | Admit: 2013-04-14 | Discharge: 2013-04-15 | Disposition: A | Discharge status: Home / Self Care | Payer: Medicaid Other | Attending: Emergency Medicine | Admitting: Emergency Medicine

## 2013-04-14 ENCOUNTER — Encounter (HOSPITAL_COMMUNITY): Payer: Self-pay | Admitting: Emergency Medicine

## 2013-04-14 DIAGNOSIS — R109 Unspecified abdominal pain: Secondary | ICD-10-CM

## 2013-04-14 LAB — CBC WITH DIFFERENTIAL/PLATELET
Basophils Absolute: 0 10*3/uL (ref 0.0–0.1)
Basophils Relative: 0 % (ref 0–1)
Eosinophils Absolute: 0.1 10*3/uL (ref 0.0–0.7)
Eosinophils Relative: 1 % (ref 0–5)
HCT: 42 % (ref 36.0–46.0)
HEMOGLOBIN: 14.1 g/dL (ref 12.0–15.0)
LYMPHS PCT: 22 % (ref 12–46)
Lymphs Abs: 3.5 10*3/uL (ref 0.7–4.0)
MCH: 28.3 pg (ref 26.0–34.0)
MCHC: 33.6 g/dL (ref 30.0–36.0)
MCV: 84.2 fL (ref 78.0–100.0)
Monocytes Absolute: 0.8 10*3/uL (ref 0.1–1.0)
Monocytes Relative: 5 % (ref 3–12)
NEUTROS ABS: 11.9 10*3/uL — AB (ref 1.7–7.7)
Neutrophils Relative %: 73 % (ref 43–77)
Platelets: 313 10*3/uL (ref 150–400)
RBC: 4.99 MIL/uL (ref 3.87–5.11)
RDW: 13.7 % (ref 11.5–15.5)
WBC: 16.3 10*3/uL — ABNORMAL HIGH (ref 4.0–10.5)

## 2013-04-14 LAB — COMPREHENSIVE METABOLIC PANEL
ALBUMIN: 3.7 g/dL (ref 3.5–5.2)
ALK PHOS: 92 U/L (ref 39–117)
ALT: 28 U/L (ref 0–35)
AST: 21 U/L (ref 0–37)
BILIRUBIN TOTAL: 0.3 mg/dL (ref 0.3–1.2)
BUN: 10 mg/dL (ref 6–23)
CHLORIDE: 99 meq/L (ref 96–112)
CO2: 23 mEq/L (ref 19–32)
Calcium: 9.3 mg/dL (ref 8.4–10.5)
Creatinine, Ser: 0.69 mg/dL (ref 0.50–1.10)
GFR calc non Af Amer: >90 mL/min (ref >90)
GLUCOSE: 99 mg/dL (ref 70–99)
POTASSIUM: 3.7 meq/L (ref 3.7–5.3)
Sodium: 136 mEq/L — ABNORMAL LOW (ref 137–147)
Total Protein: 7.7 g/dL (ref 6.0–8.3)

## 2013-04-14 LAB — URINALYSIS, ROUTINE W REFLEX MICROSCOPIC
BILIRUBIN URINE: NEGATIVE
Glucose, UA: NEGATIVE mg/dL
HGB URINE DIPSTICK: NEGATIVE
Ketones, ur: NEGATIVE mg/dL
Nitrite: NEGATIVE
Protein, ur: NEGATIVE mg/dL
SPECIFIC GRAVITY, URINE: 1.028 (ref 1.005–1.030)
Urobilinogen, UA: 0.2 mg/dL (ref 0.0–1.0)
pH: 5.5 (ref 5.0–8.0)

## 2013-04-14 LAB — URINE MICROSCOPIC-ADD ON

## 2013-04-14 LAB — LIPASE, BLOOD: Lipase: 25 U/L (ref 11–59)

## 2013-04-14 LAB — POC URINE PREG, ED: Preg Test, Ur: NEGATIVE

## 2013-04-14 MED ORDER — PANTOPRAZOLE SODIUM 40 MG IV SOLR
40.0000 mg | Freq: Once | INTRAVENOUS | Status: AC
  Administered 2013-04-15: 40 mg via INTRAVENOUS
  Filled 2013-04-14: qty 40

## 2013-04-14 MED ORDER — GI COCKTAIL ~~LOC~~
30.0000 mL | Freq: Once | ORAL | Status: AC
  Administered 2013-04-15: 30 mL via ORAL
  Filled 2013-04-14: qty 30

## 2013-04-14 MED ORDER — SODIUM CHLORIDE 0.9 % IV SOLN
Freq: Once | INTRAVENOUS | Status: AC
  Administered 2013-04-15: 01:00:00 via INTRAVENOUS

## 2013-04-14 MED ORDER — ONDANSETRON HCL 4 MG/2ML IJ SOLN
4.0000 mg | Freq: Once | INTRAMUSCULAR | Status: AC
  Administered 2013-04-15: 4 mg via INTRAVENOUS
  Filled 2013-04-14: qty 2

## 2013-04-14 NOTE — ED Notes (Signed)
Per pt report: pt c/o of diffuse burning abd pain that began yesterday.  Pt denies taking an medication. Pt reports nausea but denies V/D, SOB, fevers, or chills.  Pt a/o x 4.  Skin warm and dry.  Pt ambulatory in triage. Pt denies any urinary symptoms.

## 2013-04-14 NOTE — ED Provider Notes (Signed)
CSN: [id number]     Arrival date & time 04/14/13  2055 History   First MD Initiated Contact with Patient 04/14/13 2317     Chief Complaint  Patient presents with  . Abdominal Pain     (Consider location/radiation/quality/duration/timing/severity/associated sxs/prior Treatment) Patient is a 28 y.o. female presenting with abdominal pain. The history is provided by the patient.  Abdominal Pain She had onset yesterday of a burning pain in her lower abdomen with radiation to the left lower quadrant. She denies any radiation to the upper abdomen or to the back. Pain is severe and she rates it at 9/10. It is worse after eating but nothing makes it better. There's been nausea but no vomiting. She's had frequent bowel movements which he states have not been diarrhea. There has been some urinary urgency and frequency but no dysuria. Denies fever, chills, sweats. She has not taken anything to try and help her pain. 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  Past Medical History  Diagnosis Date  . Anemia   . Thrombocytosis   . S/P IVC filter   . History of DVT of lower extremity    Past Surgical History  Procedure Laterality Date  . Abdominal surgery     No family history on file. History  Substance Use Topics  . Smoking status: Never Smoker   . Smokeless tobacco: Not on file  . Alcohol Use: No   OB History   Grav Para Term Preterm Abortions TAB SAB Ect Mult Living                 Review of Systems  Gastrointestinal: Positive for abdominal pain.  All other systems reviewed and are negative.      Allergies  Review of patient's allergies indicates no known allergies.  Home Medications   Current Outpatient Rx  Name  Route  Sig  Dispense  Refill  . Rivaroxaban (XARELTO) 20 MG TABS   Oral   Take 1 tablet (20 mg total) by mouth daily.   30 tablet   12    BP 118/72  Pulse 97  Temp(Src) 97.7 F (36.5 C) (Oral)   Resp 18  Ht 5\' 2"  (1.575 m)  Wt 280 lb (127.007 kg)  BMI 51.20 kg/m2  SpO2 99% Physical Exam  Nursing note and vitals reviewed.  Morbidly obese 28 year old female, resting comfortably and in no acute distress. Vital signs are normal. Oxygen saturation is 99%, which is normal. Head is normocephalic and atraumatic. PERRLA, EOMI. Oropharynx is clear. Neck is nontender and supple without adenopathy or JVD. Back is nontender and there is no CVA tenderness. Lungs are clear without rales, wheezes, or rhonchi. Chest is nontender. Heart has regular rate and rhythm without murmur. Abdomen is soft, flat, with mild tenderness in the epigastric area and also in the suprapubic area. There are no masses or hepatosplenomegaly and peristalsis is normoactive. Extremities have 1+ edema, full range of motion is present. Skin is warm and dry without rash. Neurologic: Mental status is normal, cranial nerves are intact, there are no motor or sensory deficits.  ED Course  Procedures (including critical care time) Labs Review Results for orders placed during the hospital encounter of 04/14/13  CBC WITH DIFFERENTIAL      Result Value Ref Range   WBC 16.3 (*) 4.0 - 10.5 K/uL   RBC 4.99  3.87 - 5.11 MIL/uL   Hemoglobin 14.1  12.0 - 15.0  g/dL   HCT 42.0  36.0 - 46.0 %   MCV 84.2  78.0 - 100.0 fL   MCH 28.3  26.0 - 34.0 pg   MCHC 33.6  30.0 - 36.0 g/dL   RDW 13.7  11.5 - 15.5 %   Platelets 313  150 - 400 K/uL   Neutrophils Relative % 73  43 - 77 %   Neutro Abs 11.9 (*) 1.7 - 7.7 K/uL   Lymphocytes Relative 22  12 - 46 %   Lymphs Abs 3.5  0.7 - 4.0 K/uL   Monocytes Relative 5  3 - 12 %   Monocytes Absolute 0.8  0.1 - 1.0 K/uL   Eosinophils Relative 1  0 - 5 %   Eosinophils Absolute 0.1  0.0 - 0.7 K/uL   Basophils Relative 0  0 - 1 %   Basophils Absolute 0.0  0.0 - 0.1 K/uL  COMPREHENSIVE METABOLIC PANEL      Result Value Ref Range   Sodium 136 (*) 137 - 147 mEq/L   Potassium 3.7  3.7 - 5.3 mEq/L    Chloride 99  96 - 112 mEq/L   CO2 23  19 - 32 mEq/L   Glucose, Bld 99  70 - 99 mg/dL   BUN 10  6 - 23 mg/dL   Creatinine, Ser 0.69  0.50 - 1.10 mg/dL   Calcium 9.3  8.4 - 10.5 mg/dL   Total Protein 7.7  6.0 - 8.3 g/dL   Albumin 3.7  3.5 - 5.2 g/dL   AST 21  0 - 37 U/L   ALT 28  0 - 35 U/L   Alkaline Phosphatase 92  39 - 117 U/L   Total Bilirubin 0.3  0.3 - 1.2 mg/dL   GFR calc non Af Amer >90  >90 mL/min   GFR calc Af Amer >90  >90 mL/min  LIPASE, BLOOD      Result Value Ref Range   Lipase 25  11 - 59 U/L  URINALYSIS, ROUTINE W REFLEX MICROSCOPIC      Result Value Ref Range   Color, Urine YELLOW  YELLOW   APPearance CLOUDY (*) CLEAR   Specific Gravity, Urine 1.028  1.005 - 1.030   pH 5.5  5.0 - 8.0   Glucose, UA NEGATIVE  NEGATIVE mg/dL   Hgb urine dipstick NEGATIVE  NEGATIVE   Bilirubin Urine NEGATIVE  NEGATIVE   Ketones, ur NEGATIVE  NEGATIVE mg/dL   Protein, ur NEGATIVE  NEGATIVE mg/dL   Urobilinogen, UA 0.2  0.0 - 1.0 mg/dL   Nitrite NEGATIVE  NEGATIVE   Leukocytes, UA SMALL (*) NEGATIVE  URINE MICROSCOPIC-ADD ON      Result Value Ref Range   Squamous Epithelial / LPF FEW (*) RARE   WBC, UA 7-10  <3 WBC/hpf   RBC / HPF 0-2  <3 RBC/hpf   Bacteria, UA FEW (*) RARE   Urine-Other MUCOUS PRESENT    POC URINE PREG, ED      Result Value Ref Range   Preg Test, Ur NEGATIVE  NEGATIVE   MDM   Final diagnoses:  Abdominal pain    Abdominal pain of uncertain cause. I urinalysis is remarkable only for 7-10 wbc's with few bacteria. Clinically, I do not feel she has a urinary tract infection, but urine is sent for culture. A burning pain and worse after eating a suggestive of gastritis or GERD or ulcer disease. WBC is elevated but review of old records shows very frequent elevation  of WBC to similar levels. There is no left shift and did not feel that this indicates significant pathology. She will be given a therapeutic trial of a GI cocktail and Protonix. She also be given  ondansetron for nausea. Hepatic function studies and lipase are normal.  She got good relief of pain with the above-noted treatment. She is discharged with prescription for pantoprazole. She is advised that she will be contacted urine culture is positive.  Delora Fuel, MD [id number]

## 2013-04-15 MED ORDER — PANTOPRAZOLE SODIUM 40 MG PO TBEC: 40.0000 mg | DELAYED_RELEASE_TABLET | Freq: Every day | ORAL | Status: DC

## 2013-04-15 NOTE — Discharge Instructions (Signed)
Gastroesophageal Reflux Disease, Adult Gastroesophageal reflux disease (GERD) happens when acid from your stomach flows up into the esophagus. When acid comes in contact with the esophagus, the acid causes soreness (inflammation) in the esophagus. Over time, GERD may create small holes (ulcers) in the lining of the esophagus. CAUSES   Increased body weight. This puts pressure on the stomach, making acid rise from the stomach into the esophagus.  Smoking. This increases acid production in the stomach.  Drinking alcohol. This causes decreased pressure in the lower esophageal sphincter (valve or ring of muscle between the esophagus and stomach), allowing acid from the stomach into the esophagus.  Late evening meals and a full stomach. This increases pressure and acid production in the stomach.  A malformed lower esophageal sphincter. Sometimes, no cause is found. SYMPTOMS   Burning pain in the lower part of the mid-chest behind the breastbone and in the mid-stomach area. This may occur twice a week or more often.  Trouble swallowing.  Sore throat.  Dry cough.  Asthma-like symptoms including chest tightness, shortness of breath, or wheezing. DIAGNOSIS  Your caregiver may be able to diagnose GERD based on your symptoms. In some cases, X-rays and other tests may be done to check for complications or to check the condition of your stomach and esophagus. TREATMENT  Your caregiver may recommend over-the-counter or prescription medicines to help decrease acid production. Ask your caregiver before starting or adding any new medicines.  HOME CARE INSTRUCTIONS   Change the factors that you can control. Ask your caregiver for guidance concerning weight loss, quitting smoking, and alcohol consumption.  Avoid foods and drinks that make your symptoms worse, such as:  Caffeine or alcoholic drinks.  Chocolate.  Peppermint or mint flavorings.  Garlic and onions.  Spicy foods.  Citrus fruits,  such as oranges, lemons, or limes.  Tomato-based foods such as sauce, chili, salsa, and pizza.  Fried and fatty foods.  Avoid lying down for the 3 hours prior to your bedtime or prior to taking a nap.  Eat small, frequent meals instead of large meals.  Wear loose-fitting clothing. Do not wear anything tight around your waist that causes pressure on your stomach.  Raise the head of your bed 6 to 8 inches with wood blocks to help you sleep. Extra pillows will not help.  Only take over-the-counter or prescription medicines for pain, discomfort, or fever as directed by your caregiver.  Do not take aspirin, ibuprofen, or other nonsteroidal anti-inflammatory drugs (NSAIDs). SEEK IMMEDIATE MEDICAL CARE IF:   You have pain in your arms, neck, jaw, teeth, or back.  Your pain increases or changes in intensity or duration.  You develop nausea, vomiting, or sweating (diaphoresis).  You develop shortness of breath, or you faint.  Your vomit is green, yellow, black, or looks like coffee grounds or blood.  Your stool is red, bloody, or black. These symptoms could be signs of other problems, such as heart disease, gastric bleeding, or esophageal bleeding. MAKE SURE YOU:   Understand these instructions.  Will watch your condition.  Will get help right away if you are not doing well or get worse. Document Released: 10/25/2004 Document Revised: 04/09/2011 Document Reviewed: 08/04/2010 [facility] Patient Information 2014 [place], [place].  Pantoprazole tablets What is this medicine? PANTOPRAZOLE (pan TOE pra zole) prevents the production of acid in the stomach. It is used to treat gastroesophageal reflux disease (GERD), inflammation of the esophagus, and Zollinger-Ellison syndrome. This medicine may be used for other purposes; ask  your health care provider or pharmacist if you have questions. COMMON BRAND NAME(S): Protonix What should I tell my health care provider before I take this  medicine? They need to know if you have any of these conditions: -liver disease -low levels of magnesium in the blood -an unusual or allergic reaction to omeprazole, lansoprazole, pantoprazole, rabeprazole, other medicines, foods, dyes, or preservatives -pregnant or trying to get pregnant -breast-feeding How should I use this medicine? Take this medicine by mouth. Swallow the tablets whole with a drink of water. Follow the directions on the prescription label. Do not crush, break, or chew. Take your medicine at regular intervals. Do not take your medicine more often than directed. Talk to your pediatrician regarding the use of this medicine in children. While this drug may be prescribed for children as young as 5 years for selected conditions, precautions do apply. Overdosage: If you think you have taken too much of this medicine contact a poison control center or emergency room at once. NOTE: This medicine is only for you. Do not share this medicine with others. What if I miss a dose? If you miss a dose, take it as soon as you can. If it is almost time for your next dose, take only that dose. Do not take double or extra doses. What may interact with this medicine? Do not take this medicine with any of the following medications: -atazanavir -nelfinavir This medicine may also interact with the following medications: -ampicillin -delavirdine -digoxin -diuretics -iron salts -medicines for fungal infections like ketoconazole, itraconazole and voriconazole -warfarin This list may not describe all possible interactions. Give your health care provider a list of all the medicines, herbs, non-prescription drugs, or dietary supplements you use. Also tell them if you smoke, drink alcohol, or use illegal drugs. Some items may interact with your medicine. What should I watch for while using this medicine? It can take several days before your stomach pain gets better. Check with your doctor or health  care professional if your condition does not start to get better, or if it gets worse. You may need blood work done while you are taking this medicine. What side effects may I notice from receiving this medicine? Side effects that you should report to your doctor or health care professional as soon as possible: -allergic reactions like skin rash, itching or hives, swelling of the face, lips, or tongue -bone, muscle or joint pain -breathing problems -chest pain or chest tightness -dark yellow or brown urine -dizziness -fast, irregular heartbeat -feeling faint or lightheaded -fever or sore throat -muscle spasm -palpitations -redness, blistering, peeling or loosening of the skin, including inside the mouth -seizures -tremors -unusual bleeding or bruising -unusually weak or tired -yellowing of the eyes or skin Side effects that usually do not require medical attention (Report these to your doctor or health care professional if they continue or are bothersome.): -constipation -diarrhea -dry mouth -headache -nausea This list may not describe all possible side effects. Call your doctor for medical advice about side effects. You may report side effects to FDA at 1-800-FDA-1088. Where should I keep my medicine? Keep out of the reach of children. Store at room temperature between 15 and 30 degrees C (59 and 86 degrees F). Protect from light and moisture. Throw away any unused medicine after the expiration date. NOTE: This sheet is a summary. It may not cover all possible information. If you have questions about this medicine, talk to your doctor, pharmacist, or health care provider.  2014, [employer]vier/Gold Standard. (2011-11-14 16:40:16)

## 2013-04-16 LAB — URINE CULTURE

## 2013-04-20 ENCOUNTER — Other Ambulatory Visit: Payer: Self-pay | Admitting: Nurse Practitioner

## 2013-04-20 DIAGNOSIS — Z86711 Personal history of pulmonary embolism: Secondary | ICD-10-CM

## 2013-04-20 MED ORDER — RIVAROXABAN 20 MG PO TABS: 20.0000 mg | ORAL_TABLET | Freq: Every day | ORAL | Status: DC

## 2013-05-27 ENCOUNTER — Other Ambulatory Visit (HOSPITAL_BASED_OUTPATIENT_CLINIC_OR_DEPARTMENT_OTHER): Payer: Medicaid Other | Admitting: Lab

## 2013-05-27 ENCOUNTER — Ambulatory Visit (HOSPITAL_BASED_OUTPATIENT_CLINIC_OR_DEPARTMENT_OTHER): Payer: Medicaid Other | Admitting: Hematology & Oncology

## 2013-05-27 ENCOUNTER — Encounter: Payer: Self-pay | Admitting: Hematology & Oncology

## 2013-05-27 VITALS — BP 122/92 | HR 91 | Temp 97.7°F | Resp 16 | Ht 62.0 in | Wt 281.0 lb

## 2013-05-27 DIAGNOSIS — Z86711 Personal history of pulmonary embolism: Secondary | ICD-10-CM

## 2013-05-27 DIAGNOSIS — D509 Iron deficiency anemia, unspecified: Secondary | ICD-10-CM

## 2013-05-27 LAB — FERRITIN CHCC: Ferritin: 178 ng/ml (ref 9–269)

## 2013-05-27 LAB — CBC WITH DIFFERENTIAL (CANCER CENTER ONLY)
BASO#: 0 10*3/uL (ref 0.0–0.2)
BASO%: 0.2 % (ref 0.0–2.0)
EOS ABS: 0.2 10*3/uL (ref 0.0–0.5)
EOS%: 1.3 % (ref 0.0–7.0)
HEMATOCRIT: 42 % (ref 34.8–46.6)
HGB: 14.1 g/dL (ref 11.6–15.9)
LYMPH#: 2.1 10*3/uL (ref 0.9–3.3)
LYMPH%: 17.6 % (ref 14.0–48.0)
MCH: 28.3 pg (ref 26.0–34.0)
MCHC: 33.6 g/dL (ref 32.0–36.0)
MCV: 84 fL (ref 81–101)
MONO#: 0.5 10*3/uL (ref 0.1–0.9)
MONO%: 4.4 % (ref 0.0–13.0)
NEUT#: 9.3 10*3/uL — ABNORMAL HIGH (ref 1.5–6.5)
NEUT%: 76.5 % (ref 39.6–80.0)
PLATELETS: 366 10*3/uL (ref 145–400)
RBC: 4.99 10*6/uL (ref 3.70–5.32)
RDW: 13.6 % (ref 11.1–15.7)
WBC: 12.1 10*3/uL — ABNORMAL HIGH (ref 3.9–10.0)

## 2013-05-27 LAB — IRON AND TIBC CHCC
%SAT: 18 % — ABNORMAL LOW (ref 21–57)
Iron: 52 ug/dL (ref 41–142)
TIBC: 284 ug/dL (ref 236–444)
UIBC: 232 ug/dL (ref 120–384)

## 2013-05-27 NOTE — Progress Notes (Signed)
Hematology and Oncology Follow Up Visit  KOLBIE CLARKSTON [id number] [birth year] 28 y.o. 05/27/2013 I  Principle Diagnosis:   Pulmonary embolism-recurrent  Intermittent iron deficiency anemia  Current Therapy:    Xarelto 20 mg by mouth daily     Interim History:  Ms.  Vanalstine is back for followup. We last saw her by probably a year ago. She's been doing okay. She finished school. She is working. She has 3 kids and they're doing okay.  She's had no problems with the Xarelto. Has been no bleeding. She still has her monthly cycles which are fairly regular. She's had no cough. Said her shortness of breath. She has been to the emergency room since her last saw her. This is for some gastric reflux.  There's been no leg swelling. She's had no rashes. She's had no fever sweats or chills. There's been no change in bowel or bladder habits.  Medications: Current outpatient prescriptions:Rivaroxaban (XARELTO) 20 MG TABS tablet, Take 1 tablet (20 mg total) by mouth daily., Disp: 30 tablet, Rfl: 12  Allergies: No Known Allergies  Past Medical History, Surgical history, Social history, and Family History were reviewed and updated.  Review of Systems: As above  Physical Exam:  height is 5\' 2"  (1.575 m) and weight is 281 lb (127.461 kg). Her oral temperature is 97.7 F (36.5 C). Her blood pressure is 122/92 and her pulse is 91. Her respiration is 16.   Obese [name]. Her head exam is no ocular or oral lesions. There are no palpable cervical supraclavicular lymph nodes. Lungs are clear. Cardiac exam regular rate and rhythm with a normal S1-S2. There are no murmurs rubs or bruits. Abdomen is soft. She's good bowel sounds. There is no fluid wave. There is no palpable abdominal mass. There is a palpable hepatospleno megaly. Back exam no tenderness over the spine ribs or hips. Extremities shows no clubbing cyanosis or edema. Neurological exam shows no focal neurological  deficits.  Lab Results  Component Value Date   WBC 12.1* 05/27/2013   HGB 14.1 05/27/2013   HCT 42.0 05/27/2013   MCV 84 05/27/2013   PLT 366 05/27/2013     Chemistry      Component Value Date/Time   NA 136* 04/14/2013 2203   K 3.7 04/14/2013 2203   CL 99 04/14/2013 2203   CO2 23 04/14/2013 2203   BUN 10 04/14/2013 2203   CREATININE 0.69 04/14/2013 2203      Component Value Date/Time   CALCIUM 9.3 04/14/2013 2203   ALKPHOS 92 04/14/2013 2203   AST 21 04/14/2013 2203   ALT 28 04/14/2013 2203   BILITOT 0.3 04/14/2013 2203         Impression and Plan: Ms. Mogensen is 28 year old [name]. She has a history of recurrent pulmonary emboli. So far, she been doing well. She's had no issues for a couple years.  We have her on Xarelto for now. I think she is going to need lifelong anticoagulation because of her past history of a thromboembolic disease.  We'll plan to get her back in 6 more months now. I don't think we any scans or additional lab studies on her.     Volanda Napoleon, MD 4/29/201511:44 AM

## 2013-08-08 ENCOUNTER — Encounter (HOSPITAL_COMMUNITY): Payer: Self-pay | Admitting: *Deleted

## 2013-08-08 ENCOUNTER — Inpatient Hospital Stay (HOSPITAL_COMMUNITY)
Admission: AD | Admit: 2013-08-08 | Discharge: 2013-08-08 | Disposition: A | Discharge status: Home / Self Care | Payer: Medicaid Other | Source: Ambulatory Visit | Attending: Obstetrics & Gynecology | Admitting: Obstetrics & Gynecology

## 2013-08-08 DIAGNOSIS — N925 Other specified irregular menstruation: Secondary | ICD-10-CM | POA: Insufficient documentation

## 2013-08-08 DIAGNOSIS — N938 Other specified abnormal uterine and vaginal bleeding: Secondary | ICD-10-CM | POA: Diagnosis present

## 2013-08-08 DIAGNOSIS — D649 Anemia, unspecified: Secondary | ICD-10-CM | POA: Insufficient documentation

## 2013-08-08 DIAGNOSIS — N898 Other specified noninflammatory disorders of vagina: Secondary | ICD-10-CM

## 2013-08-08 DIAGNOSIS — Z30431 Encounter for routine checking of intrauterine contraceptive device: Secondary | ICD-10-CM | POA: Insufficient documentation

## 2013-08-08 DIAGNOSIS — N939 Abnormal uterine and vaginal bleeding, unspecified: Secondary | ICD-10-CM

## 2013-08-08 DIAGNOSIS — N949 Unspecified condition associated with female genital organs and menstrual cycle: Secondary | ICD-10-CM | POA: Insufficient documentation

## 2013-08-08 DIAGNOSIS — D759 Disease of blood and blood-forming organs, unspecified: Secondary | ICD-10-CM | POA: Insufficient documentation

## 2013-08-08 DIAGNOSIS — Z86711 Personal history of pulmonary embolism: Secondary | ICD-10-CM | POA: Diagnosis not present

## 2013-08-08 LAB — URINALYSIS, ROUTINE W REFLEX MICROSCOPIC
Bilirubin Urine: NEGATIVE
Glucose, UA: NEGATIVE mg/dL
Ketones, ur: NEGATIVE mg/dL
LEUKOCYTES UA: NEGATIVE
NITRITE: NEGATIVE
PH: 6 (ref 5.0–8.0)
Protein, ur: NEGATIVE mg/dL
SPECIFIC GRAVITY, URINE: 1.025 (ref 1.005–1.030)
Urobilinogen, UA: 0.2 mg/dL (ref 0.0–1.0)

## 2013-08-08 LAB — URINE MICROSCOPIC-ADD ON

## 2013-08-08 LAB — POCT PREGNANCY, URINE: Preg Test, Ur: NEGATIVE

## 2013-08-08 LAB — CBC
HCT: 37.6 % (ref 36.0–46.0)
Hemoglobin: 12.8 g/dL (ref 12.0–15.0)
MCH: 28.6 pg (ref 26.0–34.0)
MCHC: 34 g/dL (ref 30.0–36.0)
MCV: 84.1 fL (ref 78.0–100.0)
Platelets: 335 10*3/uL (ref 150–400)
RBC: 4.47 MIL/uL (ref 3.87–5.11)
RDW: 13.4 % (ref 11.5–15.5)
WBC: 13.2 10*3/uL — AB (ref 4.0–10.5)

## 2013-08-08 NOTE — MAU Note (Signed)
Vaginal bleeding off and on  since yesterday. Got Mirana in 2011 and has not had any bleeding since placement. Since 2300 tonight had more bleeding. No pain

## 2013-08-08 NOTE — MAU Provider Note (Signed)
History     CSN: [id number]  Arrival date and time: 08/08/13 0100   None     Chief Complaint  Patient presents with  . Vaginal Bleeding   Vaginal Bleeding Pertinent negatives include no abdominal pain.    Pt is here with report of bleeding that started yesterday.  Pt is on xarelto x 1 year for history of pulmonary embolism in 2012.  Current birth control method Mirena x 4 years.  Has not had a vaginal bleeding until now with the Mirena.  No report of cramping.  Bleeding is described as light to moderate.  Small clots.  Patient concerned of possible pregnancy.    Past Medical History  Diagnosis Date  . Anemia   . Thrombocytosis   . S/P IVC filter   . History of DVT of lower extremity     Past Surgical History  Procedure Laterality Date  . Abdominal surgery      Family History  Problem Relation Age of Onset  . Diabetes Mother   . Hypertension Mother   . Hypertension Maternal Uncle   . Diabetes Maternal Uncle   . Hypertension Maternal Grandmother     History  Substance Use Topics  . Smoking status: Never Smoker   . Smokeless tobacco: Never Used     Comment: never used tobacco  . Alcohol Use: No    Allergies: No Known Allergies  Prescriptions prior to admission  Medication Sig Dispense Refill  . acetaminophen (TYLENOL) 500 MG tablet Take 1,000 mg by mouth every 6 (six) hours as needed.      . Rivaroxaban (XARELTO) 20 MG TABS tablet Take 1 tablet (20 mg total) by mouth daily.  30 tablet  12    Review of Systems  Gastrointestinal: Negative for abdominal pain.  Genitourinary: Positive for vaginal bleeding.       Vaginal bleeding  All other systems reviewed and are negative.  Physical Exam   Blood pressure 121/53, pulse 81, temperature 97.6 F (36.4 C), temperature source Oral, resp. rate 18, SpO2 96.00%.  Physical Exam  Constitutional: She is oriented to person, place, and time. She appears well-developed and well-nourished. No distress.  HENT:  Head:  Normocephalic.  Eyes: Pupils are equal, round, and reactive to light.  Neck: Normal range of motion. Neck supple.  Cardiovascular: Normal rate, regular rhythm and normal heart sounds.   Respiratory: Effort normal and breath sounds normal.  GI: Soft. There is no tenderness.  Genitourinary: Cervix exhibits no motion tenderness. There is bleeding (scant, blood tinged mucus. ) around the vagina. Vaginal discharge: white, creamy.  IUD strings visualized  Neurological: She is alert and oriented to person, place, and time. She has normal reflexes.  Skin: Skin is warm and dry.    MAU Course  Procedures  Results for orders placed during the hospital encounter of 08/08/13 (from the past 24 hour(s))  URINALYSIS, ROUTINE W REFLEX MICROSCOPIC     Status: Abnormal   Collection Time    08/08/13  1:10 AM      Result Value Ref Range   Color, Urine YELLOW  YELLOW   APPearance CLEAR  CLEAR   Specific Gravity, Urine 1.025  1.005 - 1.030   pH 6.0  5.0 - 8.0   Glucose, UA NEGATIVE  NEGATIVE mg/dL   Hgb urine dipstick LARGE (*) NEGATIVE   Bilirubin Urine NEGATIVE  NEGATIVE   Ketones, ur NEGATIVE  NEGATIVE mg/dL   Protein, ur NEGATIVE  NEGATIVE mg/dL   Urobilinogen, UA 0.2  0.0 - 1.0 mg/dL   Nitrite NEGATIVE  NEGATIVE   Leukocytes, UA NEGATIVE  NEGATIVE  URINE MICROSCOPIC-ADD ON     Status: Abnormal   Collection Time    08/08/13  1:10 AM      Result Value Ref Range   Squamous Epithelial / LPF RARE  RARE   WBC, UA 0-2  <3 WBC/hpf   RBC / HPF 3-6  <3 RBC/hpf   Bacteria, UA FEW (*) RARE  POCT PREGNANCY, URINE     Status: None   Collection Time    08/08/13  1:23 AM      Result Value Ref Range   Preg Test, Ur NEGATIVE  NEGATIVE  CBC     Status: Abnormal   Collection Time    08/08/13  2:00 AM      Result Value Ref Range   WBC 13.2 (*) 4.0 - 10.5 K/uL   RBC 4.47  3.87 - 5.11 MIL/uL   Hemoglobin 12.8  12.0 - 15.0 g/dL   HCT 37.6  36.0 - 46.0 %   MCV 84.1  78.0 - 100.0 fL   MCH 28.6  26.0 - 34.0  pg   MCHC 34.0  30.0 - 36.0 g/dL   RDW 13.4  11.5 - 15.5 %   Platelets 335  150 - 400 K/uL    Assessment and Plan  Vaginal Bleeding - Mirena HX of Pulmonary Embolism/DVT  Plan: Continue with Mirena Explained that the onset of vaginal bleeding common during last year of use Reviewed bleeding precautions  [facility] 08/08/2013, 2:12 AM

## 2013-08-10 NOTE — MAU Provider Note (Signed)
Attestation of Attending Supervision of Advanced Practitioner (PA/CNM/NP): Evaluation and management procedures were performed by the Advanced Practitioner under my supervision and collaboration.  I have reviewed the Advanced Practitioner's note and chart, and I agree with the management and plan.  Brenyn Petrey, MD, FACOG Attending Obstetrician & Gynecologist Faculty Practice, [facility]   

## 2013-11-26 ENCOUNTER — Ambulatory Visit: Payer: Medicaid Other | Admitting: Family

## 2013-11-26 ENCOUNTER — Other Ambulatory Visit: Payer: Medicaid Other | Admitting: Lab

## 2013-11-30 ENCOUNTER — Encounter (HOSPITAL_COMMUNITY): Payer: Self-pay | Admitting: *Deleted

## 2014-06-02 ENCOUNTER — Other Ambulatory Visit: Payer: Self-pay | Admitting: *Deleted

## 2014-06-02 DIAGNOSIS — Z86711 Personal history of pulmonary embolism: Secondary | ICD-10-CM

## 2014-06-02 MED ORDER — RIVAROXABAN 20 MG PO TABS
20.0000 mg | ORAL_TABLET | Freq: Every day | ORAL | Status: DC
Start: 2014-06-02 — End: 2014-09-09

## 2014-09-09 ENCOUNTER — Telehealth: Payer: Self-pay | Admitting: *Deleted

## 2014-09-09 DIAGNOSIS — Z86711 Personal history of pulmonary embolism: Secondary | ICD-10-CM

## 2014-09-09 MED ORDER — RIVAROXABAN 20 MG PO TABS: 20.0000 mg | ORAL_TABLET | Freq: Every day | ORAL | Status: DC

## 2014-09-09 NOTE — Telephone Encounter (Signed)
Patient stating that she has no way to pay for her Xarelto. She states her insurance has stopped paying for it. She hasn't taken the medication in about 3 weeks. Patient hasn't been seen in the office since 04/2013 and she was a no-show to her last appointment. Explained that she needed to keep her appointments as scheduled for optimum care and prescription management. She understood and will make an appointment to see the Nurse Practitioner next week.   Prescription for Xarelto will be sent to the downstairs pharmacy for refill and to determine why insurance isn't paying. Will work at getting patient medication coverage and/or financial aid for Xarelto.

## 2014-09-10 ENCOUNTER — Encounter: Payer: Self-pay | Admitting: Family

## 2014-09-10 ENCOUNTER — Ambulatory Visit (HOSPITAL_BASED_OUTPATIENT_CLINIC_OR_DEPARTMENT_OTHER): Payer: Medicaid Other | Admitting: Family

## 2014-09-10 ENCOUNTER — Other Ambulatory Visit (HOSPITAL_BASED_OUTPATIENT_CLINIC_OR_DEPARTMENT_OTHER): Payer: Medicaid Other

## 2014-09-10 ENCOUNTER — Other Ambulatory Visit: Payer: Self-pay | Admitting: *Deleted

## 2014-09-10 VITALS — BP 133/94 | HR 92 | Temp 97.6°F | Resp 16 | Ht 62.0 in | Wt 299.0 lb

## 2014-09-10 DIAGNOSIS — Z86711 Personal history of pulmonary embolism: Secondary | ICD-10-CM

## 2014-09-10 DIAGNOSIS — D509 Iron deficiency anemia, unspecified: Secondary | ICD-10-CM

## 2014-09-10 DIAGNOSIS — C8190 Hodgkin lymphoma, unspecified, unspecified site: Secondary | ICD-10-CM

## 2014-09-10 LAB — CBC WITH DIFFERENTIAL (CANCER CENTER ONLY)
BASO#: 0 10*3/uL (ref 0.0–0.2)
BASO%: 0.3 % (ref 0.0–2.0)
EOS%: 1.4 % (ref 0.0–7.0)
Eosinophils Absolute: 0.2 10*3/uL (ref 0.0–0.5)
HEMATOCRIT: 39.1 % (ref 34.8–46.6)
HEMOGLOBIN: 12.9 g/dL (ref 11.6–15.9)
LYMPH#: 2.2 10*3/uL (ref 0.9–3.3)
LYMPH%: 19.7 % (ref 14.0–48.0)
MCH: 27.4 pg (ref 26.0–34.0)
MCHC: 33 g/dL (ref 32.0–36.0)
MCV: 83 fL (ref 81–101)
MONO#: 0.7 10*3/uL (ref 0.1–0.9)
MONO%: 5.9 % (ref 0.0–13.0)
NEUT#: 8.1 10*3/uL — ABNORMAL HIGH (ref 1.5–6.5)
NEUT%: 72.7 % (ref 39.6–80.0)
Platelets: 326 10*3/uL (ref 145–400)
RBC: 4.71 10*6/uL (ref 3.70–5.32)
RDW: 14.3 % (ref 11.1–15.7)
WBC: 11.1 10*3/uL — ABNORMAL HIGH (ref 3.9–10.0)

## 2014-09-10 LAB — IRON AND TIBC CHCC
%SAT: 14 % — AB (ref 21–57)
Iron: 37 ug/dL — ABNORMAL LOW (ref 41–142)
TIBC: 261 ug/dL (ref 236–444)
UIBC: 224 ug/dL (ref 120–384)

## 2014-09-10 LAB — FERRITIN CHCC: FERRITIN: 134 ng/mL (ref 9–269)

## 2014-09-10 NOTE — Progress Notes (Signed)
Hematology and Oncology Follow Up Visit  CAREN GARSKE [id number] 18-Mar-1985 29 y.o. 09/10/2014   Principle Diagnosis:  Pulmonary embolism - recurrent Intermittent iron deficiency anemia  Current Therapy:   Xarelto 20 mg by mouth daily - lifelong     Interim History:  Ms. Panozzo is here today for a follow-up. We have not seen her in a year. She ran out of Xarelto 1 month ago and her insurance would not refill it until she made an appointment with [name].  She has been having some fatigue and SOB with exertion at times. This comes and goes.  She denies fever, sweats, chills, n/v, cough, rash, dizziness, blurred vision, chest pain, palpitations, abdominal pain, changes in bowel or bladder habits. She has not noticed and blood in her urine or stool. She has migraines at times.     No lymphadenopathy found on exam.  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 This really contributes to her fatigue and causes her to have aching in her knees and back. She has no swelling, numbness or tingling in her extremities.   She is eating healthy and staying well hydrated. Her weight is stable.   Medications:    Medication List       This list is accurate as of: 09/10/14 10:45 AM.  Always use your most recent med list.               acetaminophen 500 MG tablet  Commonly known as:  TYLENOL  Take 1,000 mg by mouth every 6 (six) hours as needed.     rivaroxaban 20 MG Tabs tablet  Commonly known as:  XARELTO  Take 1 tablet (20 mg total) by mouth daily.        Allergies: No Known Allergies  Past Medical History, Surgical history, Social history, and Family History were reviewed and updated.  Review of Systems: All other 10 point review of systems is negative.   Physical Exam:  height is 5\' 2"  (1.575 m) and weight is 299 lb (135.626 kg). Her oral temperature is 97.6 F (36.4 C). Her blood pressure is 133/94 and her pulse is 92. Her respiration is 16.   Wt Readings from  Last 3 Encounters:  09/10/14 299 lb (135.626 kg)  05/27/13 281 lb (127.461 kg)  04/14/13 280 lb (127.007 kg)    Ocular: Sclerae unicteric, pupils equal, round and reactive to light Ear-nose-throat: Oropharynx clear, dentition fair Lymphatic: No cervical or supraclavicular adenopathy Lungs no rales or rhonchi, good excursion bilaterally Heart regular rate and rhythm, no murmur appreciated Abd soft, nontender, positive bowel sounds MSK no focal spinal tenderness, no joint edema Neuro: non-focal, well-oriented, appropriate affect Breasts: Deferred  Lab Results  Component Value Date   WBC 11.1* 09/10/2014   HGB 12.9 09/10/2014   HCT 39.1 09/10/2014   MCV 83 09/10/2014   PLT 326 09/10/2014   Lab Results  Component Value Date   FERRITIN 178 05/27/2013   IRON 52 05/27/2013   TIBC 284 05/27/2013   UIBC 232 05/27/2013   IRONPCTSAT 18* 05/27/2013   Lab Results  Component Value Date   RETICCTPCT 1.7 09/28/2010   RBC 4.71 09/10/2014   RETICCTABS 83.3 09/28/2010   No results found for: KPAFRELGTCHN, LAMBDASER, KAPLAMBRATIO No results found for: IGGSERUM, IGA, IGMSERUM No results found for: TOTALPROTELP, ALBUMINELP, A1GS, A2GS, BETS, BETA2SER, GAMS, MSPIKE, SPEI   Chemistry      Component Value Date/Time   NA 136* 04/14/2013 2203  K 3.7 04/14/2013 2203   CL 99 04/14/2013 2203   CO2 23 04/14/2013 2203   BUN 10 04/14/2013 2203   CREATININE 0.69 04/14/2013 2203      Component Value Date/Time   CALCIUM 9.3 04/14/2013 2203   ALKPHOS 92 04/14/2013 2203   AST 21 04/14/2013 2203   ALT 28 04/14/2013 2203   BILITOT 0.3 04/14/2013 2203     Impression and Plan: Ms. Kuhnert is 29 year old [name] with a history of recurrent pulmonary emboli. Her last episode was several years ago. She has done well on Xarelto. She ran out of her medication almost a month ago. We will get her back on 20 mg daily starting today. We gave her some samples to get her through the next  week or so while we get everything settled with her insurance.  We will plan to see her back in 1 year for labs and follow-up.  She will contact us with any questions or concerns. We can certainly see her sooner if need be.   Eliezer Bottom, NP 8/12/201610:45 AM

## 2014-09-13 LAB — D-DIMER, QUANTITATIVE: D-Dimer, Quant: 0.33 ug/mL-FEU (ref 0.00–0.48)

## 2014-10-18 ENCOUNTER — Telehealth: Payer: Self-pay | Admitting: Hematology & Oncology

## 2014-10-18 NOTE — Telephone Encounter (Signed)
Pt called needing assistance w his [employer] Reasons and was given the following programs below:   [employer] & [facility], [employer]. Patient Assistance Program P: [place] P: [phone]    T

## 2014-11-13 ENCOUNTER — Emergency Department (HOSPITAL_COMMUNITY)
Admission: EM | Admit: 2014-11-13 | Discharge: 2014-11-13 | Disposition: A | Discharge status: Home / Self Care | Payer: Self-pay | Attending: Emergency Medicine | Admitting: Emergency Medicine

## 2014-11-13 ENCOUNTER — Encounter (HOSPITAL_COMMUNITY): Payer: Self-pay

## 2014-11-13 ENCOUNTER — Emergency Department (HOSPITAL_COMMUNITY): Payer: Medicaid Other

## 2014-11-13 DIAGNOSIS — Z3202 Encounter for pregnancy test, result negative: Secondary | ICD-10-CM | POA: Insufficient documentation

## 2014-11-13 DIAGNOSIS — R1031 Right lower quadrant pain: Secondary | ICD-10-CM

## 2014-11-13 DIAGNOSIS — N8301 Follicular cyst of right ovary: Secondary | ICD-10-CM | POA: Insufficient documentation

## 2014-11-13 DIAGNOSIS — N83201 Unspecified ovarian cyst, right side: Secondary | ICD-10-CM

## 2014-11-13 DIAGNOSIS — Z86718 Personal history of other venous thrombosis and embolism: Secondary | ICD-10-CM | POA: Insufficient documentation

## 2014-11-13 DIAGNOSIS — Z862 Personal history of diseases of the blood and blood-forming organs and certain disorders involving the immune mechanism: Secondary | ICD-10-CM | POA: Insufficient documentation

## 2014-11-13 LAB — COMPREHENSIVE METABOLIC PANEL
ALT: 22 U/L (ref 14–54)
AST: 14 U/L — AB (ref 15–41)
Albumin: 3.4 g/dL — ABNORMAL LOW (ref 3.5–5.0)
Alkaline Phosphatase: 81 U/L (ref 38–126)
Anion gap: 8 (ref 5–15)
BILIRUBIN TOTAL: 0.5 mg/dL (ref 0.3–1.2)
BUN: 7 mg/dL (ref 6–20)
CALCIUM: 9.1 mg/dL (ref 8.9–10.3)
CO2: 26 mmol/L (ref 22–32)
Chloride: 105 mmol/L (ref 101–111)
Creatinine, Ser: 0.73 mg/dL (ref 0.44–1.00)
GFR calc Af Amer: >60 mL/min (ref >60)
Glucose, Bld: 99 mg/dL (ref 65–99)
Potassium: 3.9 mmol/L (ref 3.5–5.1)
Sodium: 139 mmol/L (ref 135–145)
TOTAL PROTEIN: 6.9 g/dL (ref 6.5–8.1)

## 2014-11-13 LAB — URINALYSIS, ROUTINE W REFLEX MICROSCOPIC
Bilirubin Urine: NEGATIVE
Glucose, UA: NEGATIVE mg/dL
HGB URINE DIPSTICK: NEGATIVE
Ketones, ur: NEGATIVE mg/dL
Nitrite: NEGATIVE
PROTEIN: NEGATIVE mg/dL
Specific Gravity, Urine: 1.02 (ref 1.005–1.030)
UROBILINOGEN UA: 0.2 mg/dL (ref 0.0–1.0)
pH: 6.5 (ref 5.0–8.0)

## 2014-11-13 LAB — LIPASE, BLOOD: Lipase: 21 U/L — ABNORMAL LOW (ref 22–51)

## 2014-11-13 LAB — CBC
HEMATOCRIT: 39.9 % (ref 36.0–46.0)
Hemoglobin: 12.9 g/dL (ref 12.0–15.0)
MCH: 26.8 pg (ref 26.0–34.0)
MCHC: 32.3 g/dL (ref 30.0–36.0)
MCV: 82.8 fL (ref 78.0–100.0)
Platelets: 328 10*3/uL (ref 150–400)
RBC: 4.82 MIL/uL (ref 3.87–5.11)
RDW: 14.3 % (ref 11.5–15.5)
WBC: 11.7 10*3/uL — ABNORMAL HIGH (ref 4.0–10.5)

## 2014-11-13 LAB — URINE MICROSCOPIC-ADD ON

## 2014-11-13 LAB — POC URINE PREG, ED: PREG TEST UR: NEGATIVE

## 2014-11-13 MED ORDER — IOHEXOL 300 MG/ML  SOLN
100.0000 mL | Freq: Once | INTRAMUSCULAR | Status: AC | PRN
  Administered 2014-11-13: 100 mL via INTRAVENOUS

## 2014-11-13 MED ORDER — MORPHINE SULFATE (PF) 4 MG/ML IV SOLN
4.0000 mg | Freq: Once | INTRAVENOUS | Status: AC
  Administered 2014-11-13: 4 mg via INTRAVENOUS
  Filled 2014-11-13: qty 1

## 2014-11-13 MED ORDER — ONDANSETRON HCL 4 MG/2ML IJ SOLN
4.0000 mg | Freq: Once | INTRAMUSCULAR | Status: AC
  Administered 2014-11-13: 4 mg via INTRAVENOUS
  Filled 2014-11-13: qty 2

## 2014-11-13 MED ORDER — HYDROCODONE-ACETAMINOPHEN 5-325 MG PO TABS: 2.0000 | ORAL_TABLET | ORAL | Status: DC | PRN

## 2014-11-13 MED ORDER — SODIUM CHLORIDE 0.9 % IV BOLUS (SEPSIS)
1000.0000 mL | Freq: Once | INTRAVENOUS | Status: AC
  Administered 2014-11-13: 1000 mL via INTRAVENOUS

## 2014-11-13 MED ORDER — RIVAROXABAN 20 MG PO TABS: 20.0000 mg | ORAL_TABLET | Freq: Every day | ORAL | Status: DC

## 2014-11-13 NOTE — ED Notes (Signed)
Pt; having sharp abdominal pain. Woke her up this am denies any n/v/d

## 2014-11-13 NOTE — ED Provider Notes (Signed)
CSN: [id number]     Arrival date & time 11/13/14  1353 History   First MD Initiated Contact with Patient 11/13/14 1504     Chief Complaint  Patient presents with  . Abdominal Pain     (Consider location/radiation/quality/duration/timing/severity/associated sxs/prior Treatment) HPI Comments: Patient is a 29 year old female with a past medical history of PE, left common iliac thrombus, and DVT who presents with abdominal pain that started this morning. The pain is located in the RLQ and does not radiate. The pain is described as sharp and severe. The pain started suddenly and remained constant since the onset. No alleviating/aggravating factors. The patient has tried nothing for symptoms without relief. Patient reports this pain feeling like "her previous blood clot in her belly." Associated symptoms include nothing. Patient denies fever, headache, NVD, chest pain, SOB, dysuria, constipation, abnormal vaginal bleeding/discharge. No history of abdominal surgery. Patient reports taking her Xarelto as directed for the last 5 years but recently her Medicaid has not covered her prescription so she has not been taking it.      Past Medical History  Diagnosis Date  . Anemia   . Thrombocytosis ([place])   . S/P IVC filter   . History of DVT of lower extremity    Past Surgical History  Procedure Laterality Date  . Abdominal surgery     Family History  Problem Relation Age of Onset  . Diabetes Mother   . Hypertension Mother   . Hypertension Maternal Uncle   . Diabetes Maternal Uncle   . Hypertension Maternal Grandmother    Social History  Substance Use Topics  . Smoking status: Never Smoker   . Smokeless tobacco: Never Used     Comment: never used tobacco  . Alcohol Use: No   OB History    Gravida Para Term Preterm AB TAB SAB Ectopic Multiple Living   3 3 3       2      Review of Systems  Gastrointestinal: Positive for abdominal pain.  All other systems reviewed and are  negative.     Allergies  Review of patient's allergies indicates no known allergies.  Home Medications   Prior to Admission medications   Medication Sig Start Date End Date Taking? Authorizing Provider  acetaminophen (TYLENOL) 500 MG tablet Take 1,000 mg by mouth every 6 (six) hours as needed for mild pain.    Yes Historical Provider, MD  rivaroxaban (XARELTO) 20 MG TABS tablet Take 1 tablet (20 mg total) by mouth daily. 09/09/14  Yes Volanda Napoleon, MD   BP 142/91 mmHg  Pulse 98  Temp(Src) 98.5 F (36.9 C) (Oral)  Resp 20  Ht 5\' 3"  (1.6 m)  Wt 294 lb 12.8 oz (133.72 kg)  BMI 52.23 kg/m2  SpO2 96%  LMP 10/23/2014 Physical Exam  Constitutional: She is oriented to person, place, and time. She appears well-developed and well-nourished. No distress.  HENT:  Head: Normocephalic and atraumatic.  Eyes: Conjunctivae and EOM are normal.  Neck: Normal range of motion.  Cardiovascular: Normal rate, regular rhythm and intact distal pulses.  Exam reveals no gallop and no friction rub.   No murmur heard. No lower extremity edema or calf tenderness to palpation.   Pulmonary/Chest: Effort normal and breath sounds normal. She has no wheezes. She has no rales. She exhibits no tenderness.  Abdominal: Soft. She exhibits no distension. There is tenderness. There is no rebound.  RLQ tenderness to palpation. No other focal tenderness.   Musculoskeletal: Normal  range of motion.  Neurological: She is alert and oriented to person, place, and time. Coordination normal.  Speech is goal-oriented. Moves limbs without ataxia.   Skin: Skin is warm and dry.  Psychiatric: She has a normal mood and affect. Her behavior is normal.  Nursing note and vitals reviewed.   ED Course  Procedures (including critical care time) Labs Review Labs Reviewed  LIPASE, BLOOD - Abnormal; Notable for the following:    Lipase 21 (*)    All other components within normal limits  COMPREHENSIVE METABOLIC PANEL - Abnormal;  Notable for the following:    Albumin 3.4 (*)    AST 14 (*)    All other components within normal limits  CBC - Abnormal; Notable for the following:    WBC 11.7 (*)    All other components within normal limits  URINALYSIS, ROUTINE W REFLEX MICROSCOPIC (NOT AT [facility]) - Abnormal; Notable for the following:    APPearance CLOUDY (*)    Leukocytes, UA MODERATE (*)    All other components within normal limits  URINE MICROSCOPIC-ADD ON - Abnormal; Notable for the following:    Squamous Epithelial / LPF FEW (*)    Bacteria, UA FEW (*)    All other components within normal limits  POC URINE PREG, ED    Imaging Review Ct Abdomen Pelvis W Contrast  11/13/2014  CLINICAL DATA:  Right lower quadrant pain since this morning. EXAM: CT ABDOMEN AND PELVIS WITH CONTRAST TECHNIQUE: Multidetector CT imaging of the abdomen and pelvis was performed using the standard protocol following bolus administration of intravenous contrast. CONTRAST:  110mL OMNIPAQUE IOHEXOL 300 MG/ML  SOLN COMPARISON:  09/04/2010 FINDINGS: Lower chest:  No acute findings. Hepatobiliary: Diffuse hepatic steatosis again demonstrated. No liver masses identified. Several partially calcified gallstones noted, however there is no evidence of cholecystitis or biliary ductal dilatation. Pancreas: No mass, inflammatory changes, or other significant abnormality. Spleen: Within normal limits in size and appearance. Adrenals/Urinary Tract: No masses identified. No evidence of hydronephrosis. Stomach/Bowel: No evidence of obstruction, inflammatory process, or abnormal fluid collections. Normal appendix visualized. Vascular/Lymphatic: No pathologically enlarged lymph nodes. No evidence of abdominal aortic aneurysm. Reproductive: Uterus is normal in appearance. A 4.7 cm cyst is seen in the right adnexa which is new and likely physiologic in a reproductive age female. No evidence of free fluid. Other: None. Musculoskeletal:  No suspicious bone lesions  identified. IMPRESSION: New 4.7 cm right adnexal cyst, likely physiologic in a reproductive age female. Recommend followup by pelvic ultrasound in 6-12 weeks. Cholelithiasis. No radiographic evidence of cholecystitis. Diffuse hepatic steatosis. Electronically Signed   By: Earle Gell M.D.   On: 11/13/2014 18:13   I have personally reviewed and evaluated these images and lab results as part of my medical decision-making.   EKG Interpretation None      MDM   Final diagnoses:  Right ovarian cyst    3:38 PM Labs and urine unremarkable for acute changes. Patient reports this pain feels like her previous blood clot in her left common iliac vein. Patient will have morphine and zofran for symptoms and CT angio abdomen to rule out clot. Patient reports she has been inconsistent with her Xarelto lately.   7:19 PM Patient's imaging shows right adnexal cyst measuring 4.7cm. No clot noted. Patient's symptoms likely due to cyst. Patient will be discharged with vicodin for pain and instructions to follow up with [facility]. Patient also given refill of Xarelto with 30 day coupon to ensure  proper anticoagulation. Patient instructed to follow up with PCP.   Alvina Chou, PA-C 11/13/14 1930  Charlesetta Shanks, MD 11/14/14 2007

## 2014-11-13 NOTE — Discharge Instructions (Signed)
Take vicodin as needed for pain. Take Xarelto as directed. Make a follow up appointment with your primary care provider. Refer to attached documents for more information.

## 2014-11-13 NOTE — ED Notes (Signed)
Patient transported to CT 

## 2014-12-08 ENCOUNTER — Encounter: Payer: Medicaid Other | Admitting: Student

## 2014-12-16 ENCOUNTER — Ambulatory Visit (INDEPENDENT_AMBULATORY_CARE_PROVIDER_SITE_OTHER): Payer: Self-pay | Admitting: Advanced Practice Midwife

## 2014-12-16 ENCOUNTER — Encounter: Payer: Self-pay | Admitting: Advanced Practice Midwife

## 2014-12-16 VITALS — BP 144/98 | HR 93 | Temp 98.2°F | Ht 60.0 in | Wt 295.4 lb

## 2014-12-16 DIAGNOSIS — N949 Unspecified condition associated with female genital organs and menstrual cycle: Secondary | ICD-10-CM | POA: Insufficient documentation

## 2014-12-16 NOTE — Progress Notes (Signed)
Subjective:     Patient ID: Virginia Fitzgerald, female   DOB: Jun 11, 1985, 29 y.o.   MRN: [id number]  HPI Virginia Fitzgerald is a 28yo female presenting today for follow up of right ovarian cyst. - States pain is much improved, down to 2/10 from 10/10 in severity from recent ED visit - Went to ED on 10/15 for right lower quadrant pain. CT abdomen pelvis with 4.7cm right adnexal cyst. Follow up ultrasound recommended in 6-12 weeks - States pain is now midline and she believes it is associated with her menstrual cycle. Currently on cycle. Feels that menstrual cycle is slightly heavier than normal - Has been using Tylenol PRN pain and this has been working well - Reports subjectively feeling warm, but denies any objective fevers - Reports diarrhea three weeks ago, but states this has resolved  Review of Systems Per HPI    Objective:   Physical Exam  Constitutional: She appears well-developed and well-nourished. No distress.  HENT:  Head: Normocephalic and atraumatic.  Cardiovascular: Normal rate and regular rhythm.  Exam reveals no gallop and no friction rub.   No murmur heard. Pulmonary/Chest: Effort normal. No respiratory distress. She has no wheezes. She has no rales.  Abdominal: Soft. She exhibits no distension.  Suprapubic tenderness, negative Murphy's, negative rebound, negative Rovsing's, no masses palpated  Musculoskeletal: She exhibits no edema.  Psychiatric: She has a normal mood and affect. Her behavior is normal.       Assessment and Plan:     Adnexal cyst - Documented on CT abdomen pelvis on 11/13/14, right sided - Suspect pain has resolved. Pain now midline and suspected to be secondary to menstruation. - Continue Tylenol or Ibuprofen PRN pain - Repeat US in 2-8 weeks - Follow up if pain fails to improve        Seen also by me Agree with note Plan to repeat US in 2 weeks to follow Recommend NSAIDs for dysmenorrhea Seabron Spates, CNM

## 2014-12-16 NOTE — Assessment & Plan Note (Signed)
-   Documented on CT abdomen pelvis on 11/13/14, right sided - Suspect pain has resolved. Pain now midline and suspected to be secondary to menstruation. - Continue Tylenol or Ibuprofen PRN pain - Repeat US in 2-8 weeks - Follow up if pain fails to improve

## 2014-12-16 NOTE — Patient Instructions (Signed)
Ovarian Cyst An ovarian cyst is a fluid-filled sac that forms on an ovary. The ovaries are small organs that produce eggs in women. Various types of cysts can form on the ovaries. Most are not cancerous. Many do not cause problems, and they often go away on their own. Some may cause symptoms and require treatment. Common types of ovarian cysts include:  Functional cysts--These cysts may occur every month during the menstrual cycle. This is normal. The cysts usually go away with the next menstrual cycle if the woman does not get pregnant. Usually, there are no symptoms with a functional cyst.  Endometrioma cysts--These cysts form from the tissue that lines the uterus. They are also called "chocolate cysts" because they become filled with blood that turns brown. This type of cyst can cause pain in the lower abdomen during intercourse and with your menstrual period.  Cystadenoma cysts--This type develops from the cells on the outside of the ovary. These cysts can get very big and cause lower abdomen pain and pain with intercourse. This type of cyst can twist on itself, cut off its blood supply, and cause severe pain. It can also easily rupture and cause a lot of pain.  Dermoid cysts--This type of cyst is sometimes found in both ovaries. These cysts may contain different kinds of body tissue, such as skin, teeth, hair, or cartilage. They usually do not cause symptoms unless they get very big.  Theca lutein cysts--These cysts occur when too much of a certain hormone (human chorionic gonadotropin) is produced and overstimulates the ovaries to produce an egg. This is most common after procedures used to assist with the conception of a baby (in vitro fertilization). CAUSES   Fertility drugs can cause a condition in which multiple large cysts are formed on the ovaries. This is called ovarian hyperstimulation syndrome.  A condition called polycystic ovary syndrome can cause hormonal imbalances that can lead to  nonfunctional ovarian cysts. SIGNS AND SYMPTOMS  Many ovarian cysts do not cause symptoms. If symptoms are present, they may include:  Pelvic pain or pressure.  Pain in the lower abdomen.  Pain during sexual intercourse.  Increasing girth (swelling) of the abdomen.  Abnormal menstrual periods.  Increasing pain with menstrual periods.  Stopping having menstrual periods without being pregnant. DIAGNOSIS  These cysts are commonly found during a routine or annual pelvic exam. Tests may be ordered to find out more about the cyst. These tests may include:  Ultrasound.  X-ray of the pelvis.  CT scan.  MRI.  Blood tests. TREATMENT  Many ovarian cysts go away on their own without treatment. Your health care provider may want to check your cyst regularly for 2-3 months to see if it changes. For women in menopause, it is particularly important to monitor a cyst closely because of the higher rate of ovarian cancer in menopausal women. When treatment is needed, it may include any of the following:  A procedure to drain the cyst (aspiration). This may be done using a long needle and ultrasound. It can also be done through a laparoscopic procedure. This involves using a thin, lighted tube with a tiny camera on the end (laparoscope) inserted through a small incision.  Surgery to remove the whole cyst. This may be done using laparoscopic surgery or an open surgery involving a larger incision in the lower abdomen.  Hormone treatment or birth control pills. These methods are sometimes used to help dissolve a cyst. HOME CARE INSTRUCTIONS   Only take over-the-counter   or prescription medicines as directed by your health care provider.  Follow up with your health care provider as directed.  Get regular pelvic exams and Pap tests. SEEK MEDICAL CARE IF:   Your periods are late, irregular, or painful, or they stop.  Your pelvic pain or abdominal pain does not go away.  Your abdomen becomes  larger or swollen.  You have pressure on your bladder or trouble emptying your bladder completely.  You have pain during sexual intercourse.  You have feelings of fullness, pressure, or discomfort in your stomach.  You lose weight for no apparent reason.  You feel generally ill.  You become constipated.  You lose your appetite.  You develop acne.  You have an increase in body and facial hair.  You are gaining weight, without changing your exercise and eating habits.  You think you are pregnant. SEEK IMMEDIATE MEDICAL CARE IF:   You have increasing abdominal pain.  You feel sick to your stomach (nauseous), and you throw up (vomit).  You develop a fever that comes on suddenly.  You have abdominal pain during a bowel movement.  Your menstrual periods become heavier than usual. MAKE SURE YOU:  Understand these instructions.  Will watch your condition.  Will get help right away if you are not doing well or get worse.   This information is not intended to replace advice given to you by your health care provider. Make sure you discuss any questions you have with your health care provider.   Document Released: 01/15/2005 Document Revised: 01/20/2013 Document Reviewed: 09/22/2012 Elsevier Interactive Patient Education 2016 Elsevier Inc.  

## 2014-12-30 ENCOUNTER — Ambulatory Visit (HOSPITAL_COMMUNITY): Admission: RE | Admit: 2014-12-30 | Payer: Self-pay | Source: Ambulatory Visit

## 2015-01-18 ENCOUNTER — Telehealth: Payer: Self-pay | Admitting: *Deleted

## 2015-01-18 ENCOUNTER — Emergency Department (HOSPITAL_COMMUNITY)
Admission: EM | Admit: 2015-01-18 | Discharge: 2015-01-18 | Disposition: A | Discharge status: Home / Self Care | Payer: Medicaid Other | Attending: Emergency Medicine | Admitting: Emergency Medicine

## 2015-01-18 ENCOUNTER — Encounter (HOSPITAL_COMMUNITY): Payer: Self-pay | Admitting: Emergency Medicine

## 2015-01-18 ENCOUNTER — Emergency Department (HOSPITAL_COMMUNITY): Payer: Medicaid Other

## 2015-01-18 DIAGNOSIS — J029 Acute pharyngitis, unspecified: Secondary | ICD-10-CM | POA: Insufficient documentation

## 2015-01-18 DIAGNOSIS — Z86718 Personal history of other venous thrombosis and embolism: Secondary | ICD-10-CM | POA: Insufficient documentation

## 2015-01-18 DIAGNOSIS — R079 Chest pain, unspecified: Secondary | ICD-10-CM

## 2015-01-18 DIAGNOSIS — R000 Tachycardia, unspecified: Secondary | ICD-10-CM | POA: Insufficient documentation

## 2015-01-18 DIAGNOSIS — I2782 Chronic pulmonary embolism: Secondary | ICD-10-CM

## 2015-01-18 HISTORY — DX: Other pulmonary embolism without acute cor pulmonale: I26.99

## 2015-01-18 LAB — CBC
HCT: 39.5 % (ref 36.0–46.0)
Hemoglobin: 12.7 g/dL (ref 12.0–15.0)
MCH: 26.4 pg (ref 26.0–34.0)
MCHC: 32.2 g/dL (ref 30.0–36.0)
MCV: 82.1 fL (ref 78.0–100.0)
PLATELETS: 389 10*3/uL (ref 150–400)
RBC: 4.81 MIL/uL (ref 3.87–5.11)
RDW: 14 % (ref 11.5–15.5)
WBC: 12.9 10*3/uL — AB (ref 4.0–10.5)

## 2015-01-18 LAB — PROTIME-INR
INR: 1.08 (ref 0.00–1.49)
PROTHROMBIN TIME: 14.2 s (ref 11.6–15.2)

## 2015-01-18 LAB — I-STAT TROPONIN, ED: TROPONIN I, POC: 0 ng/mL (ref 0.00–0.08)

## 2015-01-18 LAB — URINALYSIS, ROUTINE W REFLEX MICROSCOPIC
Bilirubin Urine: NEGATIVE
GLUCOSE, UA: NEGATIVE mg/dL
HGB URINE DIPSTICK: NEGATIVE
KETONES UR: NEGATIVE mg/dL
Nitrite: NEGATIVE
PH: 5.5 (ref 5.0–8.0)
PROTEIN: NEGATIVE mg/dL
Specific Gravity, Urine: 1.027 (ref 1.005–1.030)

## 2015-01-18 LAB — POC URINE PREG, ED: Preg Test, Ur: NEGATIVE

## 2015-01-18 LAB — URINE MICROSCOPIC-ADD ON

## 2015-01-18 LAB — BASIC METABOLIC PANEL
Anion gap: 7 (ref 5–15)
BUN: 11 mg/dL (ref 6–20)
CALCIUM: 9 mg/dL (ref 8.9–10.3)
CHLORIDE: 108 mmol/L (ref 101–111)
CO2: 25 mmol/L (ref 22–32)
CREATININE: 0.87 mg/dL (ref 0.44–1.00)
Glucose, Bld: 97 mg/dL (ref 65–99)
Potassium: 3.9 mmol/L (ref 3.5–5.1)
SODIUM: 140 mmol/L (ref 135–145)

## 2015-01-18 MED ORDER — RIVAROXABAN 20 MG PO TABS: 20.0000 mg | ORAL_TABLET | Freq: Every day | ORAL | Status: DC

## 2015-01-18 MED ORDER — ENOXAPARIN SODIUM 150 MG/ML ~~LOC~~ SOLN
1.0000 mg/kg | Freq: Once | SUBCUTANEOUS | Status: AC
  Administered 2015-01-18: 135 mg via SUBCUTANEOUS
  Filled 2015-01-18: qty 0.89

## 2015-01-18 MED ORDER — IOHEXOL 350 MG/ML SOLN
100.0000 mL | Freq: Once | INTRAVENOUS | Status: AC | PRN
  Administered 2015-01-18: 100 mL via INTRAVENOUS

## 2015-01-18 MED ORDER — SODIUM CHLORIDE 0.9 % IV BOLUS (SEPSIS)
1000.0000 mL | Freq: Once | INTRAVENOUS | Status: AC
  Administered 2015-01-18: 1000 mL via INTRAVENOUS

## 2015-01-18 NOTE — ED Notes (Signed)
Breakfast Tray ordered @ [id number].

## 2015-01-18 NOTE — ED Notes (Signed)
Pt breakfast tray is on its way per dietary.  Pt to be d/c after.

## 2015-01-18 NOTE — ED Provider Notes (Signed)
CSN: [id number]     Arrival date & time 01/18/15  0012 History  By signing my name below, I, Evelene Croon, attest that this documentation has been prepared under the direction and in the presence of Merrily Pew, MD . Electronically Signed: Evelene Croon, Scribe. 01/18/2015. 2:31 AM.     Chief Complaint  Patient presents with  . Chest Pain    The history is provided by the patient. No language interpreter was used.     HPI Comments:  Jameika Hendee is a 29 y.o. female with a history of PE, who presents to the Emergency Department complaining of a persistent cough for 2 days. She reports associated CP and sore throat secondary to cough, and DOE for 2 weeks. No alleviating factors noted. Pt was diagnosed with a PE in 2011 and abdominal blood clot in 2014. She notes her symptoms today are similar. She has not been on her blood thinner, xarelto, for ~ 1 month due to issues with her insurance.  She denies nausea, vomiting and acute LE swelling.    Past Medical History  Diagnosis Date  . Pulmonary embolism ([place])    History reviewed. No pertinent past surgical history. No family history on file. Social History  Substance Use Topics  . Smoking status: Never Smoker   . Smokeless tobacco: None  . Alcohol Use: No   OB History    No data available     Review of Systems  Constitutional: Negative for fever and chills.  HENT: Positive for sore throat.   Respiratory: Positive for cough and shortness of breath (DOE).   Cardiovascular: Positive for chest pain. Negative for leg swelling.  Gastrointestinal: Negative for nausea and vomiting.  All other systems reviewed and are negative.  Allergies  Review of patient's allergies indicates no known allergies.  Home Medications   Prior to Admission medications   Medication Sig Start Date End Date Taking? Authorizing Provider  rivaroxaban (XARELTO) 20 MG TABS tablet Take 20 mg by mouth daily with supper.   Yes Historical Provider, MD    BP 116/77 mmHg  Pulse 101  Temp(Src) 98.2 F (36.8 C) (Oral)  Resp 19  SpO2 98%  LMP 01/11/2015 Physical Exam  Constitutional: She is oriented to person, place, and time. She appears well-developed and well-nourished. No distress.  HENT:  Head: Normocephalic and atraumatic.  Eyes: Conjunctivae are normal.  Cardiovascular: Regular rhythm and normal heart sounds.  Tachycardia present.   Pulmonary/Chest: Effort normal and breath sounds normal. No respiratory distress.  Abdominal: She exhibits no distension.  Musculoskeletal: She exhibits no edema.  Neurological: She is alert and oriented to person, place, and time.  Skin: Skin is warm and dry.  Psychiatric: She has a normal mood and affect.  Nursing note and vitals reviewed.   ED Course  Procedures   DIAGNOSTIC STUDIES:  Oxygen Saturation is 98% on RA, normal by my interpretation.    COORDINATION OF CARE:  1:10 AM Discussed treatment plan with pt at bedside and pt agreed to plan.  Labs Review Labs Reviewed  CBC - Abnormal; Notable for the following:    WBC 12.9 (*)    All other components within normal limits  URINALYSIS, ROUTINE W REFLEX MICROSCOPIC (NOT AT [facility]) - Abnormal; Notable for the following:    APPearance CLOUDY (*)    Leukocytes, UA MODERATE (*)    All other components within normal limits  URINE MICROSCOPIC-ADD ON - Abnormal; Notable for the following:    Squamous Epithelial / LPF  0-5 (*)    Bacteria, UA FEW (*)    All other components within normal limits  BASIC METABOLIC PANEL  PROTIME-INR  I-STAT TROPOININ, ED  POC URINE PREG, ED    Imaging Review Dg Chest 2 View  01/18/2015  CLINICAL DATA:  29 year old female with cough and chest pain EXAM: CHEST  2 VIEW COMPARISON:  None. FINDINGS: The heart size and mediastinal contours are within normal limits. Both lungs are clear. The visualized skeletal structures are unremarkable. IMPRESSION: No active cardiopulmonary disease. Electronically Signed   By:  Anner Crete M.D.   On: 01/18/2015 01:18   I have personally reviewed and evaluated these images and lab results as part of my medical decision-making.   EKG Interpretation   Date/Time:  Tuesday January 18 2015 00:17:19 EST Ventricular Rate:  118 PR Interval:  150 QRS Duration: 70 QT Interval:  320 QTC Calculation: 448 R Axis:   9 Text Interpretation:  Sinus tachycardia Minimal voltage criteria for LVH,  may be normal variant Nonspecific T wave abnormality Abnormal ECG  Confirmed by Jensyn Cambria MD, Corene Cornea [phone]) on 01/18/2015 1:11:25 AM      MDM   Final diagnoses:  Chest pain   Recurrent PE, appears chronic. No e/o R heart strain, hypoxia, hypotension or other significant changes from old embolus. Will restart on xarelto, 30 day free supply given, social work consulted, will consult to see if any help to be offered afterwards, will also schedule fu w/ hematologist to consider trying coumadin again.    I personally performed the services described in this documentation, which was scribed in my presence. The recorded information has been reviewed and is accurate.    Merrily Pew, MD 01/18/15 [phone]

## 2015-01-18 NOTE — ED Notes (Signed)
Pt. reports central chest pain with SOB and dry cough onset yesterday , denies nausea or diaphoresis , pain increases when coughing /deep inspiration.

## 2015-01-18 NOTE — Telephone Encounter (Signed)
Patient is an old patient of Dr Marin Olp - per chart last visit was in 2012, however patient states she did see him this year. She was back in the ED last pm with a diagnosis of recurrent PE. They gave her one month supply of Xarelto, but patient has had issues in the past obtaining the medication. Will schedule patient to be seen in the office for management, follow up and assistance with obtaining the medication. Appointments scheduled. Patient aware.

## 2015-01-20 ENCOUNTER — Encounter: Payer: Self-pay | Admitting: Advanced Practice Midwife

## 2015-01-28 ENCOUNTER — Ambulatory Visit: Payer: Medicaid Other | Admitting: Family

## 2015-01-28 ENCOUNTER — Other Ambulatory Visit: Payer: Medicaid Other

## 2015-04-06 ENCOUNTER — Other Ambulatory Visit: Payer: Self-pay | Admitting: *Deleted

## 2015-04-06 MED ORDER — RIVAROXABAN 20 MG PO TABS: 20.0000 mg | ORAL_TABLET | Freq: Every day | ORAL | Status: DC

## 2015-04-07 ENCOUNTER — Telehealth: Payer: Self-pay | Admitting: Hematology & Oncology

## 2015-04-07 NOTE — Telephone Encounter (Signed)
Faxed application to: [facility] P: [phone] F: [place] [facility]  .

## 2015-06-13 ENCOUNTER — Other Ambulatory Visit: Payer: Self-pay | Admitting: Hematology & Oncology

## 2015-07-22 ENCOUNTER — Other Ambulatory Visit: Payer: Self-pay | Admitting: Hematology & Oncology

## 2015-09-06 ENCOUNTER — Other Ambulatory Visit: Payer: Self-pay | Admitting: Hematology & Oncology

## 2015-09-09 ENCOUNTER — Ambulatory Visit: Payer: Medicaid Other | Admitting: Hematology & Oncology

## 2015-09-09 ENCOUNTER — Other Ambulatory Visit: Payer: Medicaid Other

## 2015-10-27 ENCOUNTER — Other Ambulatory Visit: Payer: Self-pay | Admitting: Hematology & Oncology

## 2015-11-06 ENCOUNTER — Inpatient Hospital Stay (HOSPITAL_COMMUNITY)
Admission: AD | Admit: 2015-11-06 | Discharge: 2015-11-06 | Disposition: A | Discharge status: Home / Self Care | Payer: Medicaid Other | Source: Ambulatory Visit | Attending: Obstetrics & Gynecology | Admitting: Obstetrics & Gynecology

## 2015-11-06 ENCOUNTER — Encounter (HOSPITAL_COMMUNITY): Payer: Self-pay | Admitting: Certified Nurse Midwife

## 2015-11-06 DIAGNOSIS — Z86718 Personal history of other venous thrombosis and embolism: Secondary | ICD-10-CM | POA: Insufficient documentation

## 2015-11-06 DIAGNOSIS — A599 Trichomoniasis, unspecified: Secondary | ICD-10-CM

## 2015-11-06 DIAGNOSIS — A5901 Trichomonal vulvovaginitis: Secondary | ICD-10-CM | POA: Insufficient documentation

## 2015-11-06 DIAGNOSIS — R300 Dysuria: Secondary | ICD-10-CM | POA: Insufficient documentation

## 2015-11-06 DIAGNOSIS — Z86711 Personal history of pulmonary embolism: Secondary | ICD-10-CM | POA: Insufficient documentation

## 2015-11-06 DIAGNOSIS — Z7901 Long term (current) use of anticoagulants: Secondary | ICD-10-CM | POA: Insufficient documentation

## 2015-11-06 LAB — URINALYSIS, ROUTINE W REFLEX MICROSCOPIC
Bilirubin Urine: NEGATIVE
Glucose, UA: NEGATIVE mg/dL
Ketones, ur: NEGATIVE mg/dL
NITRITE: NEGATIVE
Protein, ur: NEGATIVE mg/dL
SPECIFIC GRAVITY, URINE: 1.02 (ref 1.005–1.030)
pH: 6 (ref 5.0–8.0)

## 2015-11-06 LAB — URINE MICROSCOPIC-ADD ON: BACTERIA UA: NONE SEEN

## 2015-11-06 LAB — POCT PREGNANCY, URINE: PREG TEST UR: NEGATIVE

## 2015-11-06 MED ORDER — METRONIDAZOLE 500 MG PO TABS
2000.0000 mg | ORAL_TABLET | Freq: Once | ORAL | Status: AC
  Administered 2015-11-06: 2000 mg via ORAL
  Filled 2015-11-06: qty 4

## 2015-11-06 NOTE — MAU Note (Signed)
Pt states she has burning upon urination and blood when she wipes. Pt states she had 2 periods in September with an LMP of 10/25/15. Pt denies cramping.

## 2015-11-06 NOTE — Discharge Instructions (Signed)
Trichomoniasis Trichomoniasis is an infection caused by an organism called Trichomonas. The infection can affect both women and men. In women, the outer female genitalia and the vagina are affected. In men, the penis is mainly affected, but the prostate and other reproductive organs can also be involved. Trichomoniasis is a sexually transmitted infection (STI) and is most often passed to another person through sexual contact.  RISK FACTORS  Having unprotected sexual intercourse.  Having sexual intercourse with an infected partner. SIGNS AND SYMPTOMS  Symptoms of trichomoniasis in women include:  Abnormal gray-green frothy vaginal discharge.  Itching and irritation of the vagina.  Itching and irritation of the area outside the vagina. Symptoms of trichomoniasis in men include:   Penile discharge with or without pain.  Pain during urination. This results from inflammation of the urethra. DIAGNOSIS  Trichomoniasis may be found during a Pap test or physical exam. Your health care provider may use one of the following methods to help diagnose this infection:  Testing the pH of the vagina with a test tape.  Using a vaginal swab test that checks for the Trichomonas organism. A test is available that provides results within a few minutes.  Examining a urine sample.  Testing vaginal secretions. Your health care provider may test you for other STIs, including HIV. TREATMENT   You may be given medicine to fight the infection. Women should inform their health care provider if they could be or are pregnant. Some medicines used to treat the infection should not be taken during pregnancy.  Your health care provider may recommend over-the-counter medicines or creams to decrease itching or irritation.  Your sexual partner will need to be treated if infected.  Your health care provider may test you for infection again 3 months after treatment. HOME CARE INSTRUCTIONS   Take medicines only as  directed by your health care provider.  Take over-the-counter medicine for itching or irritation as directed by your health care provider.  Do not have sexual intercourse while you have the infection.  Women should not douche or wear tampons while they have the infection.  Discuss your infection with your partner. Your partner may have gotten the infection from you, or you may have gotten it from your partner.  Have your sex partner get examined and treated if necessary.  Practice safe, informed, and protected sex.  See your health care provider for other STI testing. SEEK MEDICAL CARE IF:   You still have symptoms after you finish your medicine.  You develop abdominal pain.  You have pain when you urinate.  You have bleeding after sexual intercourse.  You develop a rash.  Your medicine makes you sick or makes you throw up (vomit). MAKE SURE YOU:  Understand these instructions.  Will watch your condition.  Will get help right away if you are not doing well or get worse.   This information is not intended to replace advice given to you by your health care provider. Make sure you discuss any questions you have with your health care provider.   Document Released: 07/11/2000 Document Revised: 02/05/2014 Document Reviewed: 10/27/2012 Elsevier Interactive Patient Education [employer].   Sexually Transmitted Disease A sexually transmitted disease (STD) is a disease or infection often passed to another person during sex. However, STDs can be passed through nonsexual ways. An STD can be passed through:  Spit (saliva).  Semen.  Blood.  Mucus from the vagina.  Pee (urine). HOW CAN I LESSEN MY CHANCES OF GETTING AN  STD?  Use:  Latex condoms.  Water-soluble lubricants with condoms. Do not use petroleum jelly or oils.  Dental dams. These are small pieces of latex that are used as a barrier during oral sex.  Avoid having more than one sex partner.  Do not  have sex with someone who has other sex partners.  Do not have sex with anyone you do not know or who is at high risk for an STD.  Avoid risky sex that can break your skin.  Do not have sex if you have open sores on your mouth or skin.  Avoid drinking too much alcohol or taking illegal drugs. Alcohol and drugs can affect your good judgment.  Avoid oral and anal sex acts.  Get shots (vaccines) for HPV and hepatitis.  If you are at risk of being infected with HIV, it is advised that you take a certain medicine daily to prevent HIV infection. This is called pre-exposure prophylaxis (PrEP). You may be at risk if:  You are a man who has sex with other men (MSM).  You are attracted to the opposite sex (heterosexual) and are having sex with more than one partner.  You take drugs with a needle.  You have sex with someone who has HIV.  Talk with your doctor about if you are at high risk of being infected with HIV. If you begin to take PrEP, get tested for HIV first. Get tested every 3 months for as long as you are taking PrEP.  Get tested for STDs every year if you are sexually active. If you are treated for an STD, get tested again 3 months after you are treated. WHAT SHOULD I DO IF I THINK I HAVE AN STD?  See your doctor.  Tell your sex partner(s) that you have an STD. They should be tested and treated.  Do not have sex until your doctor says it is okay. WHEN SHOULD I GET HELP? Get help right away if:  You have bad belly (abdominal) pain.  You are a man and have puffiness (swelling) or pain in your testicles.  You are a woman and have puffiness in your vagina.   This information is not intended to replace advice given to you by your health care provider. Make sure you discuss any questions you have with your health care provider.   Document Released: 02/23/2004 Document Revised: 02/05/2014 Document Reviewed: 07/11/2012 Elsevier Interactive Patient Education [employer].

## 2015-11-06 NOTE — MAU Provider Note (Signed)
Chief Complaint:  Hematuria   First Provider Initiated Contact with Patient 11/06/15 1529      HPI: Virginia Fitzgerald is a 30 y.o. [id number] who presents to maternity admissions reporting burning with urination and saw small amount of blood when she wiped.  Periods irregular.  LMP 10/25/15.  No pain. . She reports no vaginal itching/burning, h/a, dizziness, n/v, or fever/chills.  Wants to bet tested for STDs.  Wants testing for "everything"  Dysuria   This is a new problem. The current episode started in the past 7 days. The problem occurs every urination. The problem has been unchanged. The quality of the pain is described as burning. The pain is mild. There has been no fever. There is no history of pyelonephritis. Pertinent negatives include no chills, discharge, frequency, nausea or vomiting. She has tried nothing for the symptoms. There is no history of catheterization, kidney stones or recurrent UTIs.   RN Note: Pt states she has burning upon urination and blood when she wipes. Pt states she had 2 periods in September with an LMP of 10/25/15. Pt denies cramping.   Past Medical History: Past Medical History:  Diagnosis Date  . Anemia   . History of DVT of lower extremity   . Pulmonary embolism ([place])   . S/P IVC filter   . Thrombocytosis ([facility])     Past obstetric history: OB History  Gravida Para Term Preterm AB Living  3 3 3  0 0 2  SAB TAB Ectopic Multiple Live Births  0 0 0   2    # Outcome Date GA Lbr Len/2nd Weight Sex Delivery Anes PTL Lv  3 Term 03/20/09    M Vag-Spont     2 Term 04/07/08    M Vag-Spont   LIV  1 Term 06/28/02    F Vag-Spont   LIV      Past Surgical History: Past Surgical History:  Procedure Laterality Date  . ABDOMINAL SURGERY      Family History: Family History  Problem Relation Age of Onset  . Diabetes Mother   . Hypertension Mother   . Hypertension Maternal Uncle   . Diabetes Maternal Uncle   . Hypertension Maternal Grandmother     Social  History: Social History  Substance Use Topics  . Smoking status: Never Smoker  . Smokeless tobacco: Never Used     Comment: never used tobacco  . Alcohol use No    Allergies: No Known Allergies  Meds:  Prescriptions Prior to Admission  Medication Sig Dispense Refill Last Dose  . rivaroxaban (XARELTO) 20 MG TABS tablet Take 20 mg by mouth at bedtime.   11/05/2015 at 2100    I have reviewed patient's Past Medical Hx, Surgical Hx, Family Hx, Social Hx, medications and allergies.  ROS:  Review of Systems  Constitutional: Negative for chills.  Gastrointestinal: Negative for nausea and vomiting.  Genitourinary: Positive for dysuria. Negative for frequency.   Other systems negative     Physical Exam  Patient Vitals for the past 24 hrs:  BP Temp Temp src Pulse Resp  11/06/15 1511 141/89 97.8 F (36.6 C) Oral 112 18   Constitutional: Well-developed, well-nourished female in no acute distress.  Cardiovascular: normal rate and rhythm, no ectopy audible, S1 & S2 heard, no murmur Respiratory: normal effort, no distress. Lungs CTAB with no wheezes or crackles GI: Abd soft, non-tender.  Nondistended.  No rebound, No guarding.  Bowel Sounds audible  MS: Extremities nontender, no edema, normal ROM Neurologic:  Alert and oriented x 4.   Grossly nonfocal. GU: Neg CVAT. Skin:  Warm and Dry Psych:  Affect appropriate.  PELVIC EXAM: Cervix pink, visually closed, without lesion, scant white creamy discharge, vaginal walls and external genitalia normal Bimanual exam: Cervix firm, anterior, neg CMT, uterus nontender, nonenlarged, adnexa without tenderness, enlargement, or mass    Labs: Results for orders placed or performed during the hospital encounter of 11/06/15 (from the past 72 hour(s))  GC/Chlamydia probe amp ([place])not at [facility]     Status: None   Collection Time: 11/06/15 12:00 AM  Result Value Ref Range   Chlamydia Negative     Comment: Normal Reference Range - Negative    Neisseria gonorrhea Negative     Comment: Normal Reference Range - Negative  Urinalysis, Routine w reflex microscopic (not at [facility])     Status: Abnormal   Collection Time: 11/06/15  3:04 PM  Result Value Ref Range   Color, Urine YELLOW YELLOW   APPearance CLEAR CLEAR   Specific Gravity, Urine 1.020 1.005 - 1.030   pH 6.0 5.0 - 8.0   Glucose, UA NEGATIVE NEGATIVE mg/dL   Hgb urine dipstick TRACE (A) NEGATIVE   Bilirubin Urine NEGATIVE NEGATIVE   Ketones, ur NEGATIVE NEGATIVE mg/dL   Protein, ur NEGATIVE NEGATIVE mg/dL   Nitrite NEGATIVE NEGATIVE   Leukocytes, UA LARGE (A) NEGATIVE  Urine microscopic-add on     Status: Abnormal   Collection Time: 11/06/15  3:04 PM  Result Value Ref Range   Squamous Epithelial / LPF 0-5 (A) NONE SEEN   WBC, UA 6-30 0 - 5 WBC/hpf   RBC / HPF 0-5 0 - 5 RBC/hpf   Bacteria, UA NONE SEEN NONE SEEN   Urine-Other MUCOUS PRESENT     Comment: TRICHOMONAS PRESENT  Pregnancy, urine POC     Status: None   Collection Time: 11/06/15  3:12 PM  Result Value Ref Range   Preg Test, Ur NEGATIVE NEGATIVE    Comment:        THE SENSITIVITY OF THIS METHODOLOGY IS >24 mIU/mL   HIV antibody     Status: None   Collection Time: 11/06/15  4:12 PM  Result Value Ref Range   HIV Screen 4th Generation wRfx Non Reactive Non Reactive    Comment: (NOTE) Performed At: [facility] [place], [place] [id number] Lindon Romp MD [id number]   Hepatitis B surface antigen     Status: None   Collection Time: 11/06/15  4:12 PM  Result Value Ref Range   Hepatitis B Surface Ag Negative Negative    Comment: (NOTE) called to Regino Bellow @ 11:56pm 11/06/15 Silas Flood Performed At: [facility] [address] [place], [place] [id number] Lindon Romp MD [id number]   RPR     Status: None   Collection Time: 11/06/15  4:12 PM  Result Value Ref Range   RPR Ser Ql Non Reactive Non Reactive    Comment: (NOTE) Performed At: [facility] [address] [place], [place] [id number] Lindon Romp MD [id number]     Imaging:  No results found.  MAU Course/MDM: I have ordered labs as follows:  See above Imaging ordered: none Results reviewed. Discussed diagnosis of trichomonas.  Discussed possibility of other STDs and safe sex.  Discussed that trich can sometimes cause cervicitis and strawberry cervix which might explain blood she saw.   Treatments in MAU included Flagyl 2gm at once.   Declines expedited partner therapy.  Pt stable at time of discharge.  Assessment: Trichomoniasis  Dysuria   Plan: Discharge home Recommend Safe sex, abstinence until both treated for 2 weeks May follow up at [facility] department as needed Partner can go to [facility] for treatment Encouraged to return here or to other Urgent Care/ED if she develops worsening of symptoms, increase in pain, fever, or other concerning symptoms.   Hansel Feinstein CNM, MSN Certified Nurse-Midwife 11/06/2015 3:30 PM

## 2015-11-07 LAB — HEPATITIS B SURFACE ANTIGEN: Hepatitis B Surface Ag: NEGATIVE

## 2015-11-07 LAB — HIV ANTIBODY (ROUTINE TESTING W REFLEX): HIV SCREEN 4TH GENERATION: NONREACTIVE

## 2015-11-07 LAB — RPR: RPR: NONREACTIVE

## 2015-11-07 LAB — GC/CHLAMYDIA PROBE AMP (~~LOC~~) NOT AT ARMC
CHLAMYDIA, DNA PROBE: NEGATIVE
Neisseria Gonorrhea: NEGATIVE

## 2015-12-13 ENCOUNTER — Other Ambulatory Visit: Payer: Self-pay | Admitting: Hematology & Oncology

## 2016-02-13 ENCOUNTER — Other Ambulatory Visit: Payer: Self-pay | Admitting: Hematology & Oncology

## 2016-02-17 ENCOUNTER — Emergency Department (HOSPITAL_COMMUNITY): Payer: No Typology Code available for payment source

## 2016-02-17 ENCOUNTER — Emergency Department (HOSPITAL_COMMUNITY)
Admission: EM | Admit: 2016-02-17 | Discharge: 2016-02-18 | Disposition: A | Discharge status: Home / Self Care | Payer: No Typology Code available for payment source | Attending: Emergency Medicine | Admitting: Emergency Medicine

## 2016-02-17 DIAGNOSIS — Y92410 Unspecified street and highway as the place of occurrence of the external cause: Secondary | ICD-10-CM | POA: Insufficient documentation

## 2016-02-17 DIAGNOSIS — R0789 Other chest pain: Secondary | ICD-10-CM | POA: Diagnosis not present

## 2016-02-17 DIAGNOSIS — M7918 Myalgia, other site: Secondary | ICD-10-CM

## 2016-02-17 DIAGNOSIS — M542 Cervicalgia: Secondary | ICD-10-CM | POA: Diagnosis not present

## 2016-02-17 DIAGNOSIS — S4991XA Unspecified injury of right shoulder and upper arm, initial encounter: Secondary | ICD-10-CM | POA: Diagnosis present

## 2016-02-17 DIAGNOSIS — Y999 Unspecified external cause status: Secondary | ICD-10-CM | POA: Diagnosis not present

## 2016-02-17 DIAGNOSIS — Z79899 Other long term (current) drug therapy: Secondary | ICD-10-CM | POA: Insufficient documentation

## 2016-02-17 DIAGNOSIS — Y939 Activity, unspecified: Secondary | ICD-10-CM | POA: Diagnosis not present

## 2016-02-17 LAB — PREGNANCY, URINE: PREG TEST UR: NEGATIVE

## 2016-02-17 NOTE — ED Notes (Signed)
Pt placed in [place] per EDP
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
## 2016-02-17 NOTE — ED Notes (Signed)
Pt ambulated to the restroom without assistance

## 2016-02-17 NOTE — ED Notes (Signed)
Pt transported to XR.  
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
## 2016-02-18 MED ORDER — METHOCARBAMOL 500 MG PO TABS: 500.0000 mg | ORAL_TABLET | Freq: Every evening | ORAL | 0 refills | Status: DC | PRN

## 2016-02-18 NOTE — ED Notes (Signed)
ED Provider at bedside. 

## 2016-04-05 ENCOUNTER — Other Ambulatory Visit: Payer: Self-pay | Admitting: Hematology & Oncology

## 2016-05-21 ENCOUNTER — Other Ambulatory Visit: Payer: Self-pay | Admitting: Hematology & Oncology

## 2016-06-14 ENCOUNTER — Other Ambulatory Visit: Payer: Self-pay | Admitting: *Deleted

## 2016-06-14 MED ORDER — RIVAROXABAN 20 MG PO TABS: 20.0000 mg | ORAL_TABLET | Freq: Every day | ORAL | 11 refills | Status: DC

## 2017-02-23 ENCOUNTER — Encounter (HOSPITAL_COMMUNITY): Payer: Self-pay

## 2017-02-23 ENCOUNTER — Inpatient Hospital Stay (HOSPITAL_COMMUNITY)
Admission: AD | Admit: 2017-02-23 | Discharge: 2017-02-23 | Disposition: A | Discharge status: Home / Self Care | Payer: BLUE CROSS/BLUE SHIELD | Source: Ambulatory Visit | Attending: Obstetrics & Gynecology | Admitting: Obstetrics & Gynecology

## 2017-02-23 DIAGNOSIS — Z86718 Personal history of other venous thrombosis and embolism: Secondary | ICD-10-CM | POA: Insufficient documentation

## 2017-02-23 DIAGNOSIS — Z8249 Family history of ischemic heart disease and other diseases of the circulatory system: Secondary | ICD-10-CM | POA: Diagnosis not present

## 2017-02-23 DIAGNOSIS — Z86711 Personal history of pulmonary embolism: Secondary | ICD-10-CM | POA: Insufficient documentation

## 2017-02-23 DIAGNOSIS — Z3490 Encounter for supervision of normal pregnancy, unspecified, unspecified trimester: Secondary | ICD-10-CM

## 2017-02-23 DIAGNOSIS — Z9889 Other specified postprocedural states: Secondary | ICD-10-CM | POA: Diagnosis not present

## 2017-02-23 DIAGNOSIS — N912 Amenorrhea, unspecified: Secondary | ICD-10-CM | POA: Diagnosis present

## 2017-02-23 DIAGNOSIS — Z3A01 Less than 8 weeks gestation of pregnancy: Secondary | ICD-10-CM | POA: Diagnosis not present

## 2017-02-23 DIAGNOSIS — Z833 Family history of diabetes mellitus: Secondary | ICD-10-CM | POA: Diagnosis not present

## 2017-02-23 DIAGNOSIS — O99211 Obesity complicating pregnancy, first trimester: Secondary | ICD-10-CM | POA: Diagnosis not present

## 2017-02-23 DIAGNOSIS — R0609 Other forms of dyspnea: Secondary | ICD-10-CM | POA: Diagnosis not present

## 2017-02-23 DIAGNOSIS — R000 Tachycardia, unspecified: Secondary | ICD-10-CM | POA: Diagnosis present

## 2017-02-23 DIAGNOSIS — R0602 Shortness of breath: Secondary | ICD-10-CM | POA: Diagnosis present

## 2017-02-23 LAB — BASIC METABOLIC PANEL
Anion gap: 12 (ref 5–15)
BUN: 8 mg/dL (ref 6–20)
CALCIUM: 8.9 mg/dL (ref 8.9–10.3)
CHLORIDE: 103 mmol/L (ref 101–111)
CO2: 23 mmol/L (ref 22–32)
CREATININE: 0.88 mg/dL (ref 0.44–1.00)
GFR calc non Af Amer: >60 mL/min (ref >60)
Glucose, Bld: 99 mg/dL (ref 65–99)
Potassium: 3.7 mmol/L (ref 3.5–5.1)
SODIUM: 138 mmol/L (ref 135–145)

## 2017-02-23 LAB — CBC
HCT: 39 % (ref 36.0–46.0)
HEMOGLOBIN: 12.5 g/dL (ref 12.0–15.0)
MCH: 25.9 pg — AB (ref 26.0–34.0)
MCHC: 32.1 g/dL (ref 30.0–36.0)
MCV: 80.7 fL (ref 78.0–100.0)
PLATELETS: 391 10*3/uL (ref 150–400)
RBC: 4.83 MIL/uL (ref 3.87–5.11)
RDW: 14.8 % (ref 11.5–15.5)
WBC: 14.6 10*3/uL — ABNORMAL HIGH (ref 4.0–10.5)

## 2017-02-23 LAB — I-STAT BETA HCG BLOOD, ED (MC, WL, AP ONLY): HCG, QUANTITATIVE: 218.6 m[IU]/mL — AB (ref <5)

## 2017-02-23 NOTE — MAU Provider Note (Signed)
[name] is a 32 y.o. [phone] who present to MAU today for pregnancy confirmation. She denies abdominal pain or vaginal bleeding.   BP (!) 141/91 (BP Location: Right Arm)   Pulse (!) 102   Temp 97.7 F (36.5 C) (Oral)   Resp 18   Ht 5' 2.5" (1.588 m)   Wt (!) 305 lb (138.3 kg)   SpO2 99%   BMI 54.90 kg/m  CONSTITUTIONAL: Well-developed, well-nourished female in no acute distress.  CARDIOVASCULAR: Normal heart rate noted RESPIRATORY: Effort and breath sounds normal GASTROINTESTINAL:Soft, no distention noted.  No tenderness, rebound or guarding.  SKIN: Skin is warm and dry. No rash noted. Not diaphoretic. No erythema. No pallor. PSYCHIATRIC: Normal mood and affect. Normal behavior. Normal judgment and thought content.  MDM Medical screening exam complete Patient does not endorse any symptoms concerning for ectopic pregnancy or pregnancy related complication today.   A:  Amenorrhea  P: Discharge home Patient advised that she can present as a walk-in to [facility] for a pregnancy test M-Th between 8am-4pm or Friday between 8am -11am Reasons to return to MAU reviewed  Patient may return to MAU as needed or if her condition were to change or worsen  Keitha [place], CNM 02/23/2017 8:52 PM

## 2017-02-23 NOTE — ED Triage Notes (Signed)
Pt states that she has felt SOB for the past several months, hx of PE, not on blood thinners. Denies CP or cough. Pt also requesting pregnancy test.

## 2017-02-23 NOTE — MAU Note (Signed)
Pt presents to MAU with a questionable pregnancy test at home. LMP December 20th. Denies pain or bleeding.

## 2017-02-24 ENCOUNTER — Observation Stay (HOSPITAL_COMMUNITY)
Admission: EM | Admit: 2017-02-24 | Discharge: 2017-02-26 | Disposition: A | Discharge status: Home / Self Care | Payer: BLUE CROSS/BLUE SHIELD | Attending: Internal Medicine | Admitting: Internal Medicine

## 2017-02-24 ENCOUNTER — Other Ambulatory Visit: Payer: Self-pay

## 2017-02-24 ENCOUNTER — Observation Stay (HOSPITAL_COMMUNITY): Payer: BLUE CROSS/BLUE SHIELD

## 2017-02-24 DIAGNOSIS — R000 Tachycardia, unspecified: Secondary | ICD-10-CM

## 2017-02-24 DIAGNOSIS — Z86711 Personal history of pulmonary embolism: Secondary | ICD-10-CM

## 2017-02-24 DIAGNOSIS — R0609 Other forms of dyspnea: Secondary | ICD-10-CM | POA: Diagnosis not present

## 2017-02-24 DIAGNOSIS — Z3201 Encounter for pregnancy test, result positive: Secondary | ICD-10-CM

## 2017-02-24 DIAGNOSIS — R0602 Shortness of breath: Secondary | ICD-10-CM

## 2017-02-24 DIAGNOSIS — I2699 Other pulmonary embolism without acute cor pulmonale: Secondary | ICD-10-CM | POA: Diagnosis present

## 2017-02-24 DIAGNOSIS — Z3A01 Less than 8 weeks gestation of pregnancy: Secondary | ICD-10-CM

## 2017-02-24 LAB — HEPARIN LEVEL (UNFRACTIONATED)

## 2017-02-24 LAB — APTT: APTT: 37 s — AB (ref 24–36)

## 2017-02-24 LAB — HIV ANTIBODY (ROUTINE TESTING W REFLEX): HIV SCREEN 4TH GENERATION: NONREACTIVE

## 2017-02-24 MED ORDER — ONDANSETRON HCL 4 MG/2ML IJ SOLN: 4.0000 mg | Freq: Four times a day (QID) | INTRAMUSCULAR | Status: DC | PRN

## 2017-02-24 MED ORDER — ONDANSETRON HCL 4 MG PO TABS
4.0000 mg | ORAL_TABLET | Freq: Four times a day (QID) | ORAL | Status: DC | PRN
  Administered 2017-02-24: 4 mg via ORAL
  Filled 2017-02-24: qty 1

## 2017-02-24 MED ORDER — ACETAMINOPHEN 325 MG PO TABS: 650.0000 mg | ORAL_TABLET | Freq: Four times a day (QID) | ORAL | Status: DC | PRN

## 2017-02-24 MED ORDER — SODIUM CHLORIDE 0.9 % IV BOLUS (SEPSIS)
1000.0000 mL | Freq: Once | INTRAVENOUS | Status: AC
  Administered 2017-02-24: 1000 mL via INTRAVENOUS

## 2017-02-24 MED ORDER — HEPARIN BOLUS VIA INFUSION
2500.0000 [IU] | Freq: Once | INTRAVENOUS | Status: AC
Start: 2017-02-24 — End: 2017-02-24
  Administered 2017-02-24: 2500 [IU] via INTRAVENOUS
  Filled 2017-02-24: qty 2500

## 2017-02-24 MED ORDER — ACETAMINOPHEN 650 MG RE SUPP: 650.0000 mg | Freq: Four times a day (QID) | RECTAL | Status: DC | PRN

## 2017-02-24 MED ORDER — IOPAMIDOL (ISOVUE-370) INJECTION 76%
INTRAVENOUS | Status: AC
  Administered 2017-02-24: 100 mL via INTRAVENOUS
  Filled 2017-02-24: qty 100

## 2017-02-24 MED ORDER — HEPARIN (PORCINE) IN NACL 100-0.45 UNIT/ML-% IJ SOLN
2300.0000 [IU]/h | INTRAMUSCULAR | Status: DC
  Administered 2017-02-24: 1400 [IU]/h via INTRAVENOUS
  Filled 2017-02-24 (×3): qty 250

## 2017-02-24 MED ORDER — INFLUENZA VAC SPLIT QUAD 0.5 ML IM SUSY
0.5000 mL | PREFILLED_SYRINGE | INTRAMUSCULAR | Status: DC
  Filled 2017-02-24 (×2): qty 0.5

## 2017-02-24 MED ORDER — HEPARIN BOLUS VIA INFUSION
2500.0000 [IU] | Freq: Once | INTRAVENOUS | Status: AC
  Administered 2017-02-24: 2500 [IU] via INTRAVENOUS
  Filled 2017-02-24: qty 2500

## 2017-02-24 MED ORDER — HEPARIN BOLUS VIA INFUSION
5000.0000 [IU] | Freq: Once | INTRAVENOUS | Status: AC
  Administered 2017-02-24: 5000 [IU] via INTRAVENOUS
  Filled 2017-02-24: qty 5000

## 2017-02-24 NOTE — ED Provider Notes (Signed)
[place] EMERGENCY DEPARTMENT Provider Note   CSN: [id number] Arrival date & time: 02/23/17  2106     History   Chief Complaint Chief Complaint  Patient presents with  . Shortness of Breath    HPI Virginia Fitzgerald is a 32 y.o. female with a hx of anemia, DVT, PE (chart reports IVC filter, but pt denies this), iron deficiency anemia presents to the Emergency Department complaining of gradual, persistent, progressively worsening SOB onset 1 month ago. Associated symptoms include lightheadedness and significant DOE.  She denies specific orthopnea.  Pt reports feet and ankle swelling, but this is not new.  She denies chest pain.  Pt also reports concern for pregnancy.  She reports she is sexually active with 1 female partner.  No birth control.  Positive pregnancy test at home. Pt reports she missed her menses on Jan 3rd.  She reports some generalized abd pain.  Denies vaginal bleeding.    Pt reports her first DVT/PE was in 2011 after pregnancy.  Pt was placed on Coumadin and then had a 2nd set of clots in her abd.  Pt reports she had to have lytics.  Pt was then placed on lovenox. She was later switched to Xarelto. Pt reports she was told that she needed to be on blood thinners for the rest of her life, but she has been off the Xarelto for 7 months due to financial difficulty.     The history is provided by the patient and medical records. No language interpreter was used.    Past Medical History:  Diagnosis Date  . Anemia   . History of DVT of lower extremity   . Pulmonary embolism ([place])   . S/P IVC filter   . Thrombocytosis [facility])     Patient Active Problem List   Diagnosis Date Noted  . Exertional dyspnea 02/24/2017  . Pregnancy test positive 02/24/2017  . Dyspnea on exertion 02/24/2017  . Adnexal cyst 12/16/2014  . Hx of pulmonary embolus 11/14/2011  . Iron deficiency anemia 11/14/2011    Past Surgical History:  Procedure Laterality Date  .  ABDOMINAL SURGERY      OB History    Gravida Para Term Preterm AB Living   3 3 3  0 0 2   SAB TAB Ectopic Multiple Live Births   0 0 0   2       Home Medications    Prior to Admission medications   Medication Sig Start Date End Date Taking? Authorizing Provider  rivaroxaban (XARELTO) 20 MG TABS tablet Take 1 tablet (20 mg total) by mouth daily. Patient not taking: Reported on 02/24/2017 06/14/16   [place], Holli Humbles, NP    Family History Family History  Problem Relation Age of Onset  . Diabetes Mother   . Hypertension Mother   . Hypertension Maternal Uncle   . Diabetes Maternal Uncle   . Hypertension Maternal Grandmother     Social History Social History   Tobacco Use  . Smoking status: Never Smoker  . Smokeless tobacco: Never Used  . Tobacco comment: never used tobacco  Substance Use Topics  . Alcohol use: No    Alcohol/week: 0.0 oz  . Drug use: No     Allergies   Patient has no known allergies.   Review of Systems Review of Systems  Constitutional: Negative for appetite change, diaphoresis, fatigue, fever and unexpected weight change.  HENT: Negative for mouth sores.   Eyes: Negative for visual disturbance.  Respiratory: Positive  for shortness of breath. Negative for cough, chest tightness and wheezing.   Cardiovascular: Positive for palpitations and leg swelling. Negative for chest pain.  Gastrointestinal: Positive for nausea. Negative for abdominal pain, constipation, diarrhea and vomiting.  Endocrine: Negative for polydipsia, polyphagia and polyuria.  Genitourinary: Positive for menstrual problem (missed menstrual cycle). Negative for dysuria, frequency, hematuria and urgency.  Musculoskeletal: Negative for back pain and neck stiffness.  Skin: Negative for rash.  Allergic/Immunologic: Negative for immunocompromised state.  Neurological: Negative for syncope, light-headedness and headaches.  Hematological: Does not bruise/bleed easily.    Psychiatric/Behavioral: Negative for sleep disturbance. The patient is not nervous/anxious.      Physical Exam Updated Vital Signs BP 122/87   Pulse (!) 106   Temp 97.6 F (36.4 C) (Oral)   Resp 18   SpO2 100%   Physical Exam  Constitutional: She appears well-developed and well-nourished. No distress.  Awake, alert, nontoxic appearance  HENT:  Head: Normocephalic and atraumatic.  Mouth/Throat: Oropharynx is clear and moist. No oropharyngeal exudate.  Eyes: Conjunctivae are normal. No scleral icterus.  Neck: Normal range of motion. Neck supple.  Cardiovascular: Regular rhythm and intact distal pulses. Tachycardia present.  Pulses:      Radial pulses are 2+ on the right side, and 2+ on the left side.       Dorsalis pedis pulses are 2+ on the right side, and 2+ on the left side.  Capillary refill <3 sec  Pulmonary/Chest: Effort normal. Tachypnea noted. No respiratory distress. She has decreased breath sounds. She has no wheezes.  Equal chest expansion  Abdominal: Soft. Bowel sounds are normal. She exhibits no mass. There is tenderness (very mild, epigastric ). There is no rebound and no guarding.  Musculoskeletal: Normal range of motion.       Right lower leg: She exhibits edema (BLE).  Calf tenderness, L> R  Neurological: She is alert.  Speech is clear and goal oriented Moves extremities without ataxia  Skin: Skin is warm and dry. She is not diaphoretic.  Psychiatric: She has a normal mood and affect.  Nursing note and vitals reviewed.    ED Treatments / Results  Labs (all labs ordered are listed, but only abnormal results are displayed) Labs Reviewed  CBC - Abnormal; Notable for the following components:      Result Value   WBC 14.6 (*)    MCH 25.9 (*)    All other components within normal limits  APTT - Abnormal; Notable for the following components:   aPTT 37 (*)    All other components within normal limits  HEPARIN LEVEL (UNFRACTIONATED) - Abnormal; Notable  for the following components:   Heparin Unfractionated <0.10 (*)    All other components within normal limits  I-STAT BETA HCG BLOOD, ED ([facility], [facility], [facility] ONLY) - Abnormal; Notable for the following components:   I-stat hCG, quantitative 218.6 (*)    All other components within normal limits  BASIC METABOLIC PANEL  HIV ANTIBODY (ROUTINE TESTING)  HEPARIN LEVEL (UNFRACTIONATED)    EKG  EKG Interpretation  Date/Time:  Sunday February 24 2017 05:28:16 EST Ventricular Rate:  105 PR Interval:    QRS Duration: 82 QT Interval:  341 QTC Calculation: 451 R Axis:   17 Text Interpretation:  Sinus tachycardia Low voltage, precordial leads Borderline T abnormalities, diffuse leads Confirmed by Veryl Speak [phone]) on 02/24/2017 6:04:54 AM        Procedures Procedures (including critical care time)  CRITICAL CARE Performed by: Jarrett Soho Buckley Bradly Total  critical care time: 35 minutes Critical care time was exclusive of separately billable procedures and treating other patients. Critical care was necessary to treat or prevent imminent or life-threatening deterioration. Critical care was time spent personally by me on the following activities: development of treatment plan with patient and/or surrogate as well as nursing, discussions with consultants, evaluation of patient's response to treatment, examination of patient, obtaining history from patient or surrogate, ordering and performing treatments and interventions, ordering and review of laboratory studies, ordering and review of radiographic studies, pulse oximetry and re-evaluation of patient's condition.   Medications Ordered in ED Medications  acetaminophen (TYLENOL) tablet 650 mg (not administered)    Or  acetaminophen (TYLENOL) suppository 650 mg (not administered)  ondansetron (ZOFRAN) tablet 4 mg (not administered)    Or  ondansetron (ZOFRAN) injection 4 mg (not administered)  heparin ADULT infusion 100 units/mL (25000 units/258mL  sodium chloride 0.45%) (1,400 Units/hr Intravenous Rate/Dose Verify 02/24/17 0341)  sodium chloride 0.9 % bolus 1,000 mL (1,000 mLs Intravenous New Bag/Given 02/24/17 0218)  heparin bolus via infusion 5,000 Units (5,000 Units Intravenous Bolus from Bag 02/24/17 0307)     Initial Impression / Assessment and Plan / ED Course  I have reviewed the triage vital signs and the nursing notes.  Pertinent labs & imaging results that were available during my care of the patient were reviewed by me and considered in my medical decision making (see chart for details).  Clinical Course as of Feb 24 605  Ileene Hutchinson Discussed with Dr. Alcario Drought who will admit  [HM]  0251 Tachycardia improving at rest after fluids.  Patient still with significant tachycardia if she ambulates.  [HM]    Clinical Course User Index [HM] Elsworth Ledin, Gwenlyn Perking    Patient presents with concern for pregnancy and shortness of breath.  She has a long history of hypercoagulable state and has been off her anticoagulant for more than 7 months.  She is tachycardic and dyspneic at rest.  Patient become significantly tachycardic to the 130s and visibly short of breath with any sort of significant movement or walking.  Patient is indeed pregnant.  Mild leukocytosis.  Concerned that this increases her risk of DVT/PE.  Patient cannot have a CT angio at this time due to her pregnancy.  She will need admission for VQ scan, cardiac echo and further evaluation.  Patient's risk of pulmonary embolism is high at this time and she is symptomatic.  Will initiate treatment with heparin with presumptive diagnosis of pulmonary embolism.  She is not tachycardic.  Long discussion with Dr. Alcario Drought who will admit for further treatments.    Final Clinical Impressions(s) / ED Diagnoses   Final diagnoses:  SOB (shortness of breath)  Tachycardia  Less than [redacted] weeks gestation of pregnancy  History of pulmonary embolism    ED Discharge Orders     None       Loni Muse Gwenlyn Perking 02/24/17 9833    Veryl Speak, MD 02/24/17 0630

## 2017-02-24 NOTE — H&P (Signed)
History and Physical    Ersel Wadleigh DJM:[id number] DOB: 1985-07-27 DOA: 02/24/2017  PCP: Patient, No Pcp Per  Patient coming from: Home  I have personally briefly reviewed patient's old medical records in [place]  Chief Complaint: SOB  HPI: Terre Hanneman is a 32 y.o. female with medical history significant of anemia, DVT, PE at least 2x before, supposed to be on chronic anticoagulation but stopped Xarelto several months ago because medicaid wouldn't cover it she says.  ? IVC filter though patient denies this.  Patient reports gradual onset, persistent, progressively worsening SOB over the past 1 month.  Associated with feet and ankle swelling.  Patient also has positive home pregnancy test and missed menses.   ED Course: Pregnancy test positive in ED.  VQ scan ordered for AM and hospitalist asked to admit.   Review of Systems: As per HPI otherwise 10 point review of systems negative.   Past Medical History:  Diagnosis Date  . Anemia   . History of DVT of lower extremity   . Pulmonary embolism ([place])   . S/P IVC filter   . Thrombocytosis ([place])     Past Surgical History:  Procedure Laterality Date  . ABDOMINAL SURGERY       reports that  has never smoked. she has never used smokeless tobacco. She reports that she does not drink alcohol or use drugs.  No Known Allergies  Family History  Problem Relation Age of Onset  . Diabetes Mother   . Hypertension Mother   . Hypertension Maternal Uncle   . Diabetes Maternal Uncle   . Hypertension Maternal Grandmother      Prior to Admission medications   Medication Sig Start Date End Date Taking? Authorizing Provider  rivaroxaban (XARELTO) 20 MG TABS tablet Take 1 tablet (20 mg total) by mouth daily. Patient not taking: Reported on 02/24/2017 06/14/16   Eliezer Bottom, NP    Physical Exam: Vitals:   02/23/17 2111 02/23/17 2355 02/24/17 0100 02/24/17 0104  BP: (!) 148/130 122/87 135/86   Pulse: (!)  112 (!) 106 97   Resp: 18 18 15    Temp: 97.6 F (36.4 C)     TempSrc: Oral     SpO2: 97% 100% 97% 99%    Constitutional: NAD, calm, comfortable Eyes: PERRL, lids and conjunctivae normal ENMT: Mucous membranes are moist. Posterior pharynx clear of any exudate or lesions.Normal dentition.  Neck: normal, supple, no masses, no thyromegaly Respiratory: clear to auscultation bilaterally, no wheezing, no crackles. Normal respiratory effort. No accessory muscle use.  Cardiovascular: Regular rate and rhythm, no murmurs / rubs / gallops. No extremity edema. 2+ pedal pulses. No carotid bruits.  Abdomen: no tenderness, no masses palpated. No hepatosplenomegaly. Bowel sounds positive.  Musculoskeletal: no clubbing / cyanosis. No joint deformity upper and lower extremities. Good ROM, no contractures. Normal muscle tone.  Skin: no rashes, lesions, ulcers. No induration Neurologic: CN 2-12 grossly intact. Sensation intact, DTR normal. Strength 5/5 in all 4.  Psychiatric: Normal judgment and insight. Alert and oriented x 3. Normal mood.    Labs on Admission: I have personally reviewed following labs and imaging studies  CBC: Recent Labs  Lab 02/23/17 2117  WBC 14.6*  HGB 12.5  HCT 39.0  MCV 80.7  PLT 222   Basic Metabolic Panel: Recent Labs  Lab 02/23/17 2117  NA 138  K 3.7  CL 103  CO2 23  GLUCOSE 99  BUN 8  CREATININE 0.88  CALCIUM 8.9  GFR: Estimated Creatinine Clearance: 125.9 mL/min (by C-G formula based on SCr of 0.88 mg/dL). Liver Function Tests: No results for input(s): AST, ALT, ALKPHOS, BILITOT, PROT, ALBUMIN in the last 168 hours. No results for input(s): LIPASE, AMYLASE in the last 168 hours. No results for input(s): AMMONIA in the last 168 hours. Coagulation Profile: No results for input(s): INR, PROTIME in the last 168 hours. Cardiac Enzymes: No results for input(s): CKTOTAL, CKMB, CKMBINDEX, TROPONINI in the last 168 hours. BNP (last 3 results) No results for  input(s): PROBNP in the last 8760 hours. HbA1C: No results for input(s): HGBA1C in the last 72 hours. CBG: No results for input(s): GLUCAP in the last 168 hours. Lipid Profile: No results for input(s): CHOL, HDL, LDLCALC, TRIG, CHOLHDL, LDLDIRECT in the last 72 hours. Thyroid Function Tests: No results for input(s): TSH, T4TOTAL, FREET4, T3FREE, THYROIDAB in the last 72 hours. Anemia Panel: No results for input(s): VITAMINB12, FOLATE, FERRITIN, TIBC, IRON, RETICCTPCT in the last 72 hours. Urine analysis:    Component Value Date/Time   COLORURINE YELLOW 11/06/2015 1504   APPEARANCEUR CLEAR 11/06/2015 1504   LABSPEC 1.020 11/06/2015 1504   PHURINE 6.0 11/06/2015 1504   GLUCOSEU NEGATIVE 11/06/2015 1504   HGBUR TRACE (A) 11/06/2015 1504   BILIRUBINUR NEGATIVE 11/06/2015 1504   KETONESUR NEGATIVE 11/06/2015 1504   PROTEINUR NEGATIVE 11/06/2015 1504   UROBILINOGEN 0.2 11/13/2014 1441   NITRITE NEGATIVE 11/06/2015 1504   LEUKOCYTESUR LARGE (A) 11/06/2015 1504    Radiological Exams on Admission: No results found.  EKG: Independently reviewed.  Assessment/Plan Principal Problem:   Exertional dyspnea Active Problems:   Hx of pulmonary embolus   Pregnancy test positive   Dyspnea on exertion    1. Exertional dyspnea - with extensive h/o PEs in past 1. Heparin gtt - patient is supposed to be on life-long anticoagulation regardless of wether she has acute PE now or not dont think this would change. 2. VQ scan in AM 3. 2d echo in AM (PAP was 53 back in 2011 it looks like). 2. Pregnancy test positive -  1. MFM consult in AM regarding if it is even safe for patient to go through with pregnancy (especially depending on results of scans above, if patient has pulm HTN, etc).  DVT prophylaxis: Heparin gtt Code Status: Full Family Communication: No family in room Disposition Plan: Home after admit Consults called: None, call MFM in AM Admission status: Place in obs   Etta Quill. DO [facility] Pager [phone]  If 7AM-7PM, please contact day team taking care of patient [email or web address] Password [email or web address]  02/24/2017, 2:15 AM

## 2017-02-24 NOTE — Progress Notes (Signed)
Severe bilateral rales.  Clinical database including H&P and labs as well as medication list reviewed and noted.  Virginia Fitzgerald is a 32 y.o. female with medical history significant of anemia, DVT, PE at least 2x before, noncompliant to Xarelto due to?  Coverage issues presenting with progressively worsening shortness of breath and peripheral edema.  Home pregnancy test positive.   Awaiting echocardiogram.  On heparin drip. Spok eto Dr Coralee Pesa - he notes that even in 1sy trimestaer CTA is better with and safe contrast exposure and recommends switching from VQ to CTA

## 2017-02-24 NOTE — Progress Notes (Signed)
ANTICOAGULATION CONSULT NOTE   Pharmacy Consult:  Heparin Indication:  Rule out acute pulmonary embolus  No Known Allergies  Patient Measurements:   Heparin Dosing Weight: 86 kg  Vital Signs: BP: 132/86 (01/27 1000) Pulse Rate: 89 (01/27 1000)  Labs: Recent Labs    02/23/17 2117 02/24/17 0228 02/24/17 0918  HGB 12.5  --   --   HCT 39.0  --   --   PLT 391  --   --   APTT  --  37*  --   HEPARINUNFRC  --  <0.10* <0.10*  CREATININE 0.88  --   --     Estimated Creatinine Clearance: 125.9 mL/min (by C-G formula based on SCr of 0.88 mg/dL).    Assessment: 48 YOF with history of VTEs. Supposed to be on Xarelto but has not been taking it. She has had SOB over the past month and Pharmacy consulted to manage IV heparin for rule out acute PE.    Heparin level is undetectable.  No issue with heparin infusion per RN.  No bleeding reported.  Pregnancy test in the ED is positive.   Goal of Therapy:  Heparin level 0.3-0.7 units/ml Monitor platelets by anticoagulation protocol: Yes     Plan:  Rebolus with 2500 units of IV heparin, then Increase heparin gtt to 1700 units/hr Check 6 hr heparin level Daily heparin level and CBC F/U CTA to rule out/in acute PE   Dreanna Kyllo D. Mina Marble, PharmD, BCPS Pager:  [phone] 02/24/2017, 11:04 AM

## 2017-02-24 NOTE — ED Notes (Signed)
Per [employer], Dr Henderson [place] has recommended pt have CT [place] Chest/PE performed rather than VQ - aware pt is pregnant. Requested for RN to contact ordering MD - Secretary paging.

## 2017-02-24 NOTE — ED Notes (Signed)
Patient transported to CT 

## 2017-02-24 NOTE — Progress Notes (Signed)
ANTICOAGULATION CONSULT NOTE   Pharmacy Consult:  Heparin Indication:  Rule out acute pulmonary embolus  No Known Allergies  Patient Measurements:   Heparin Dosing Weight: 86 kg  Vital Signs: BP: 97/57 (01/27 1500) Pulse Rate: 104 (01/27 1400)  Labs: Recent Labs    02/23/17 2117 02/24/17 0228 02/24/17 0918 02/24/17 1808  HGB 12.5  --   --   --   HCT 39.0  --   --   --   PLT 391  --   --   --   APTT  --  37*  --   --   HEPARINUNFRC  --  <0.10* <0.10* <0.10*  CREATININE 0.88  --   --   --     Estimated Creatinine Clearance: 125.9 mL/min (by C-G formula based on SCr of 0.88 mg/dL).  Assessment: Virginia Fitzgerald with history of VTEs. Supposed to be on Xarelto but has not been taking it. She has had SOB over the past month and Pharmacy consulted to manage IV heparin for PE but CT negative. Will continue heparin for now due to history of VTE and pt was supposed to be on anticoagulation.   Heparin level remains undetectable. No issues with the line per RN.   Pregnancy test in the ED is positive.  Goal of Therapy:  Heparin level 0.3-0.7 units/ml Monitor platelets by anticoagulation protocol: Yes   Plan:  Rebolus with 2500 units of IV heparin, then Increase heparin gtt to 2000 units/hr Check 6 hr heparin level Daily heparin level and CBC  Salome Arnt, PharmD, BCPS 02/24/2017 7:31 PM

## 2017-02-24 NOTE — ED Notes (Addendum)
Nurse collected labs./correction labs collected by [employer]

## 2017-02-24 NOTE — ED Notes (Signed)
Admitting Dr paged.

## 2017-02-24 NOTE — Progress Notes (Signed)
ANTICOAGULATION CONSULT NOTE - Initial Consult  Pharmacy Consult for heparin Indication: pulmonary embolus  No Known Allergies  Patient Measurements:   Heparin Dosing Weight: 86.2 kg  Vital Signs: Temp: 97.6 F (36.4 C) (01/26 2111) Temp Source: Oral (01/26 2111) BP: 142/107 (01/27 0215) Pulse Rate: 89 (01/27 0215)  Labs: Recent Labs    02/23/17 2117  HGB 12.5  HCT 39.0  PLT 391  CREATININE 0.88    Estimated Creatinine Clearance: 125.9 mL/min (by C-G formula based on SCr of 0.88 mg/dL).   Medical History: Past Medical History:  Diagnosis Date  . Anemia   . History of DVT of lower extremity   . Pulmonary embolism ([place])   . S/P IVC filter   . Thrombocytosis ([facility])     Assessment: 32 yo female with hx of DVT and PE. Supposed to be on Xarelto but has not been taking it. She has had SOB over the past month. In ED - she had a positive pregnancy test. Planning to start heparin empirically until acute PE can be confirmed or ruled-out.    Goal of Therapy:  Heparin level 0.3-0.7 units/ml Monitor platelets by anticoagulation protocol: Yes   Plan:  -Heparin bolus 5000 units x1 then 1400 units/hr  -Obtain baseline HL and aPTT -Daily HL, CBC -First level in 6 hours -F/u plan for long-term anticoagulation that is cost-effective and okay in setting of pregnancy   Harvel Quale 02/24/2017,2:25 AM

## 2017-02-24 NOTE — ED Notes (Signed)
Admitting MD returned page. Advised him, per [facility], Dr Delrae Sawyers recommendation for pt to have CT Angio rather than VQ Scan. MD requested for RN to enter order. Advised unable to do so. Gave MD Dr Delrae Sawyers phone number so they may discuss. Pt aware of delay.

## 2017-02-25 ENCOUNTER — Telehealth: Payer: Self-pay | Admitting: *Deleted

## 2017-02-25 ENCOUNTER — Observation Stay (HOSPITAL_BASED_OUTPATIENT_CLINIC_OR_DEPARTMENT_OTHER): Payer: BLUE CROSS/BLUE SHIELD

## 2017-02-25 DIAGNOSIS — Z3A01 Less than 8 weeks gestation of pregnancy: Secondary | ICD-10-CM | POA: Diagnosis not present

## 2017-02-25 DIAGNOSIS — Z86711 Personal history of pulmonary embolism: Secondary | ICD-10-CM | POA: Diagnosis not present

## 2017-02-25 DIAGNOSIS — R0600 Dyspnea, unspecified: Secondary | ICD-10-CM

## 2017-02-25 DIAGNOSIS — R0609 Other forms of dyspnea: Secondary | ICD-10-CM | POA: Diagnosis not present

## 2017-02-25 LAB — ECHOCARDIOGRAM COMPLETE
Ao-asc: 30 cm
Area-P 1/2: 3.86 cm2
E decel time: 194 msec
FS: 32 % (ref 28–44)
IV/PV OW: 0.68
LA diam end sys: 38 mm
LA diam index: 1.48 cm/m2
LA vol index: 23.4 mL/m2
LASIZE: 38 mm
LAVOL: 59.9 mL
LAVOLA4C: 43.6 mL
LDCA: 3.46 cm2
LV PW d: 13.5 mm — AB (ref 0.6–1.1)
LVOT diameter: 21 mm
MV Dec: 194
MV pk A vel: 97 m/s
MV pk E vel: 103 m/s
MVPG: 4 mmHg
MVSPHT: 57 ms
Reg peak vel: 192 cm/s
TAPSE: 15.7 mm
TRMAXVEL: 192 cm/s

## 2017-02-25 LAB — CBC
HEMATOCRIT: 38 % (ref 36.0–46.0)
Hemoglobin: 12.2 g/dL (ref 12.0–15.0)
MCH: 25.8 pg — ABNORMAL LOW (ref 26.0–34.0)
MCHC: 32.1 g/dL (ref 30.0–36.0)
MCV: 80.3 fL (ref 78.0–100.0)
PLATELETS: 399 10*3/uL (ref 150–400)
RBC: 4.73 MIL/uL (ref 3.87–5.11)
RDW: 14.8 % (ref 11.5–15.5)
WBC: 14.2 10*3/uL — ABNORMAL HIGH (ref 4.0–10.5)

## 2017-02-25 LAB — HEPARIN LEVEL (UNFRACTIONATED): Heparin Unfractionated: 0.22 IU/mL — ABNORMAL LOW (ref 0.30–0.70)

## 2017-02-25 MED ORDER — ENOXAPARIN SODIUM 150 MG/ML ~~LOC~~ SOLN: 140.0000 mg | Freq: Two times a day (BID) | SUBCUTANEOUS | 0 refills | Status: DC

## 2017-02-25 MED ORDER — PRENATAL MULTIVITAMIN CH: 1.0000 | ORAL_TABLET | Freq: Every day | ORAL | Status: DC

## 2017-02-25 MED ORDER — ENOXAPARIN SODIUM 150 MG/ML ~~LOC~~ SOLN
140.0000 mg | Freq: Two times a day (BID) | SUBCUTANEOUS | Status: DC
  Administered 2017-02-25 – 2017-02-26 (×3): 140 mg via SUBCUTANEOUS
  Filled 2017-02-25 (×4): qty 0.93

## 2017-02-25 MED ORDER — PRENATAL MULTIVITAMIN CH
1.0000 | ORAL_TABLET | Freq: Every day | ORAL | Status: DC
  Administered 2017-02-25 – 2017-02-26 (×2): 1 via ORAL
  Filled 2017-02-25 (×2): qty 1

## 2017-02-25 NOTE — Progress Notes (Deleted)
ANTICOAGULATION CONSULT NOTE - Follow Up Consult  Pharmacy Consult for heparin Indication: h/o VTE  Labs: Recent Labs    02/23/17 2117  02/24/17 0228 02/24/17 0918 02/24/17 1808 02/25/17 0216  HGB 12.5  --   --   --   --  12.2  HCT 39.0  --   --   --   --  38.0  PLT 391  --   --   --   --  399  APTT  --   --  37*  --   --   --   HEPARINUNFRC  --    < > <0.10* <0.10* <0.10* 0.22*  CREATININE 0.88  --   --   --   --   --    < > = values in this interval not displayed.    Assessment: 32yo female to transition to Lovenox to continue chronic anti-coagulation. CT was negative for PE    Goal of Therapy:  Appropriate dosing   Plan:  DC heparin and heparin labs Lovenox 140 mg sq Q 12 hours (start 1 hour after heparin stopped)  Thank you Anette Guarneri, PharmD [phone]  02/25/2017,9:15 AM

## 2017-02-25 NOTE — Progress Notes (Signed)
Benefits check in process for Lovenox 140 mg twice a day. Whitman Hero RN,BSN,CM

## 2017-02-25 NOTE — Progress Notes (Signed)
ANTICOAGULATION CONSULT NOTE - Follow Up Consult  Pharmacy Consult for heparin Indication: h/o VTE  Labs: Recent Labs    02/23/17 2117  02/24/17 0228 02/24/17 0918 02/24/17 1808 02/25/17 0216  HGB 12.5  --   --   --   --  12.2  HCT 39.0  --   --   --   --  38.0  PLT 391  --   --   --   --  399  APTT  --   --  37*  --   --   --   HEPARINUNFRC  --    < > <0.10* <0.10* <0.10* 0.22*  CREATININE 0.88  --   --   --   --   --    < > = values in this interval not displayed.    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  Goal of Therapy:  Heparin level 0.3-0.7 units/ml   Plan:  Will increase heparin gtt by 2-3 units/kg/hr to 2300 units/hr and check level in 6 hours.   Wynona Neat, PharmD, BCPS  02/25/2017,3:27 AM

## 2017-02-25 NOTE — Progress Notes (Signed)
CM spoke with pharmacist @ [facility] and was told pt has a $ 1,500.00 deductible to met in purchasing Lovenox Rx. CM made MD aware.  Whitman Hero RN,BSN,CM

## 2017-02-25 NOTE — Progress Notes (Signed)
PROGRESS NOTE    Virginia Fitzgerald  ZOX:[id number] DOB: 12-17-1985 DOA: 02/24/2017 PCP: Patient, No Pcp Per   Outpatient Specialists:     Brief Narrative:  Virginia Fitzgerald is a 32 y.o. female with medical history significant of anemia, DVT, PE at least 2x before, supposed to be on chronic anticoagulation but stopped Xarelto several months ago because medicaid wouldn't cover it she says.  Patient reports gradual onset, persistent, progressively worsening SOB over the past 1 month.  Associated with feet and ankle swelling.  Patient also has positive home pregnancy test and missed menses.      Assessment & Plan:   Principal Problem:   Exertional dyspnea Active Problems:   Hx of pulmonary embolus   Pregnancy test positive   Dyspnea on exertion   Exertional dyspnea - with extensive h/o PEs in past -CTA negative for PE but patient needs lifelong anticoagulation -unable to afford lovenox through insurance -will attempt to go through [email or web address] if unable to find a way to get lovenox covered (high deductible) -?pregnancy medicaid? -no other anticoagulatin option as she is pregnant -suspect patient is deconditioned  Pregnancy test positive -  -outpatient GYN follow up  Morbid obesity There is no height or weight on file to calculate BMI.  -asked nursing to weigh patient -will need evaluation of OSA as well     DVT prophylaxis:  Fully anticoagulated   Code Status: Full Code  Family Communication:   Disposition Plan:     Consultants:      Subjective: Feeling better  Objective: Vitals:   02/24/17 1500 02/24/17 2143 02/25/17 0535 02/25/17 1346  BP: (!) 97/57 (!) 106/56 129/76 109/62  Pulse:  (!) 108 (!) 108 (!) 106  Resp:  18 18 18   Temp:  98 F (36.7 C) 98 F (36.7 C) 97.9 F (36.6 C)  TempSrc:  Oral Oral Oral  SpO2:  97% 100% 97%    Intake/Output Summary (Last 24 hours) at 02/25/2017 1701 Last data filed at 02/25/2017 1346 Gross per 24  hour  Intake 360 ml  Output -  Net 360 ml   There were no vitals filed for this visit.  Examination:  General exam: Appears calm and comfortable  Respiratory system: Clear to auscultation. Respiratory effort normal. Cardiovascular system: S1 & S2 heard, RRR. No JVD, murmurs, rubs, gallops or clicks. No pedal edema. Gastrointestinal system: Abdomen is nondistended, soft and nontender. No organomegaly or masses felt. Normal bowel sounds heard. Central nervous system: Alert and oriented. No focal neurological deficits. Extremities: Symmetric 5 x 5 power. Skin: No rashes, lesions or ulcers Psychiatry: Judgement and insight appear normal. Mood & affect appropriate.     Data Reviewed: I have personally reviewed following labs and imaging studies  CBC: Recent Labs  Lab 02/23/17 2117 02/25/17 0216  WBC 14.6* 14.2*  HGB 12.5 12.2  HCT 39.0 38.0  MCV 80.7 80.3  PLT 391 811   Basic Metabolic Panel: Recent Labs  Lab 02/23/17 2117  NA 138  K 3.7  CL 103  CO2 23  GLUCOSE 99  BUN 8  CREATININE 0.88  CALCIUM 8.9   GFR: Estimated Creatinine Clearance: 125.9 mL/min (by C-G formula based on SCr of 0.88 mg/dL). Liver Function Tests: No results for input(s): AST, ALT, ALKPHOS, BILITOT, PROT, ALBUMIN in the last 168 hours. No results for input(s): LIPASE, AMYLASE in the last 168 hours. No results for input(s): AMMONIA in the last 168 hours. Coagulation Profile: No results for input(s): INR, PROTIME in the last  168 hours. Cardiac Enzymes: No results for input(s): CKTOTAL, CKMB, CKMBINDEX, TROPONINI in the last 168 hours. BNP (last 3 results) No results for input(s): PROBNP in the last 8760 hours. HbA1C: No results for input(s): HGBA1C in the last 72 hours. CBG: No results for input(s): GLUCAP in the last 168 hours. Lipid Profile: No results for input(s): CHOL, HDL, LDLCALC, TRIG, CHOLHDL, LDLDIRECT in the last 72 hours. Thyroid Function Tests: No results for input(s): TSH,  T4TOTAL, FREET4, T3FREE, THYROIDAB in the last 72 hours. Anemia Panel: No results for input(s): VITAMINB12, FOLATE, FERRITIN, TIBC, IRON, RETICCTPCT in the last 72 hours. Urine analysis:    Component Value Date/Time   COLORURINE YELLOW 11/06/2015 1504   APPEARANCEUR CLEAR 11/06/2015 1504   LABSPEC 1.020 11/06/2015 1504   PHURINE 6.0 11/06/2015 1504   GLUCOSEU NEGATIVE 11/06/2015 1504   HGBUR TRACE (A) 11/06/2015 1504   BILIRUBINUR NEGATIVE 11/06/2015 1504   KETONESUR NEGATIVE 11/06/2015 1504   PROTEINUR NEGATIVE 11/06/2015 1504   UROBILINOGEN 0.2 11/13/2014 1441   NITRITE NEGATIVE 11/06/2015 1504   LEUKOCYTESUR LARGE (A) 11/06/2015 1504   Sepsis Labs: @LABRCNTIP (procalcitonin:4,lacticidven:4)  )No results found for this or any previous visit (from the past 240 hour(s)).    Anti-infectives (From admission, onward)   None       Radiology Studies: Ct Angio Chest Pe W And/or Wo Contrast  Result Date: 02/24/2017 CLINICAL DATA:  Gradual onset of progressively worsening shortness of breath over the past month. Dyspnea on exertion and lightheadedness. EXAM: CT ANGIOGRAPHY CHEST WITH CONTRAST TECHNIQUE: Multidetector CT imaging of the chest was performed using the standard protocol during bolus administration of intravenous contrast. Multiplanar CT image reconstructions and MIPs were obtained to evaluate the vascular anatomy. CONTRAST:  100 ml ISOVUE-370 IOPAMIDOL (ISOVUE-370) INJECTION 76% COMPARISON:  CT chest 01/18/2015.  PA and lateral chest 02/17/2016. FINDINGS: Cardiovascular: The study is somewhat limited by bolus timing. No pulmonary embolus is identified. Heart size is upper normal. No pericardial effusion. Mediastinum/Nodes: No lymphadenopathy. Small hiatal hernia noted. Thyroid is unremarkable in appearance. Lungs/Pleura: No pleural effusion. Lungs show only mild dependent atelectasis. Upper Abdomen: Upper abdomen demonstrates fatty infiltration of the liver. Musculoskeletal: No  acute bony abnormality. 1.5 cm nodule in the left breast measures 1.9 cm on the prior exam. Review of the MIP images confirms the above findings. IMPRESSION: Negative for pulmonary embolus or acute disease. Small hiatal hernia. Fatty infiltration of the liver. Electronically Signed   By: Inge Rise M.D.   On: 02/24/2017 16:01        Scheduled Meds: . enoxaparin (LOVENOX) injection  140 mg Subcutaneous Q12H  . Influenza vac split quadrivalent PF  0.5 mL Intramuscular Tomorrow-1000  . prenatal multivitamin  1 tablet Oral Q1200   Continuous Infusions:   LOS: 0 days    Time spent: 35 min    Geradine Girt, DO [facility] Pager [phone]  If 7PM-7AM, please contact night-coverage [email or web address] Password [facility] 02/25/2017, 5:01 PM

## 2017-02-25 NOTE — Progress Notes (Signed)
Await echo reading and cost of lovenox before d/c

## 2017-02-25 NOTE — Progress Notes (Signed)
Patient requested AD forms.  Met her and gave her the forms and went over it with her.  When she chooses, she will have nurse call chaplain to come and fill out forms and have notarized.  Conard Novak, Chaplain    02/25/17 1200  Clinical Encounter Type  Visited With Patient  Visit Type Initial  Referral From Physician  Consult/Referral To Chaplain

## 2017-02-25 NOTE — Progress Notes (Signed)
  Echocardiogram 2D Echocardiogram has been performed.  Virginia Fitzgerald Virginia Fitzgerald G Virginia Fitzgerald 02/25/2017, 11:30 AM

## 2017-02-25 NOTE — Progress Notes (Signed)
ANTICOAGULATION CONSULT NOTE - Follow Up Consult  Pharmacy Consult for heparin to Lovenox Indication: h/o VTE  Labs: Recent Labs    02/23/17 2117  02/24/17 0228 02/24/17 0918 02/24/17 1808 02/25/17 0216  HGB 12.5  --   --   --   --  12.2  HCT 39.0  --   --   --   --  38.0  PLT 391  --   --   --   --  399  APTT  --   --  37*  --   --   --   HEPARINUNFRC  --    < > <0.10* <0.10* <0.10* 0.22*  CREATININE 0.88  --   --   --   --   --    < > = values in this interval not displayed.    Assessment: 32yo female to transition to Lovenox to continue chronic anti-coagulation. CT was negative for PE    Goal of Therapy:  Appropriate dosing   Plan:  DC heparin and heparin labs Lovenox 140 mg sq Q 12 hours (start 1 hour after heparin stopped)  Thank you Anette Guarneri, PharmD [phone]  02/25/2017,11:20 AM

## 2017-02-25 NOTE — Telephone Encounter (Signed)
Patient is calling the office with question related to her anticoagulation.  She is admitted to the hospital and has recently found out that she is pregnant. She has not been taking her Xarelto for awhile due to cost. Work up in the hospital was negative for DVT/PE and the provider is wanting to discharge patient on lovenox.  Patient wants to know why she needs to be in anticoagulation and why they are changing her medication.  Explained to the patient that Xarelto is not preferred while pregnant, and that Lovenox is used frequently with pregnant patients. She stated again that she doesn't have a clot currently. Explained that she had a positive history of PE which makes her high risk, and that pregnancy also increases her risk.   Encouraged patient to make follow up appointment with Dr Marin Olp on discharge for further teaching and understanding of treatment plan. She understood.

## 2017-02-26 ENCOUNTER — Encounter: Payer: Self-pay | Admitting: Pharmacist

## 2017-02-26 ENCOUNTER — Encounter: Payer: Self-pay | Admitting: Pharmacy Technician

## 2017-02-26 DIAGNOSIS — R0609 Other forms of dyspnea: Secondary | ICD-10-CM | POA: Diagnosis not present

## 2017-02-26 DIAGNOSIS — Z3A01 Less than 8 weeks gestation of pregnancy: Secondary | ICD-10-CM | POA: Diagnosis not present

## 2017-02-26 DIAGNOSIS — Z86711 Personal history of pulmonary embolism: Secondary | ICD-10-CM | POA: Diagnosis not present

## 2017-02-26 LAB — CBC
HEMATOCRIT: 38 % (ref 36.0–46.0)
HEMOGLOBIN: 11.9 g/dL — AB (ref 12.0–15.0)
MCH: 25.4 pg — AB (ref 26.0–34.0)
MCHC: 31.3 g/dL (ref 30.0–36.0)
MCV: 81 fL (ref 78.0–100.0)
Platelets: 402 10*3/uL — ABNORMAL HIGH (ref 150–400)
RBC: 4.69 MIL/uL (ref 3.87–5.11)
RDW: 15.2 % (ref 11.5–15.5)
WBC: 13.8 10*3/uL — ABNORMAL HIGH (ref 4.0–10.5)

## 2017-02-26 MED ORDER — ENOXAPARIN SODIUM 150 MG/ML ~~LOC~~ SOLN: 140.0000 mg | Freq: Two times a day (BID) | SUBCUTANEOUS | 0 refills | Status: DC

## 2017-02-26 NOTE — Discharge Summary (Signed)
Physician Discharge Summary  Virginia Fitzgerald DQQ:[id number] DOB: [birth year] DOA: 02/24/2017  PCP: Patient, No Pcp Per  Admit date: 02/24/2017 Discharge date: 02/26/2017   Recommendations for Outpatient Follow-Up:   1. lovenox given to patient by pharmacy for 6 days-- application sent to [email or web address] Has appointment with OB on Thursday Needs lifelong anticoagulation Outpatient sleep study for OSA   Discharge Diagnosis:   Principal Problem:   Exertional dyspnea Active Problems:   Hx of pulmonary embolus   Pregnancy test positive   Dyspnea on exertion   Discharge disposition:  Home  Discharge Condition: Improved.  Diet recommendation: Low sodium, heart healthy  Wound care: None.   History of Present Illness:   Virginia Fitzgerald is a 32 y.o. female with medical history significant of anemia, DVT, PE at least 2x before, supposed to be on chronic anticoagulation but stopped Xarelto several months ago because medicaid wouldn't cover it she says.  ? IVC filter though patient denies this.  Patient reports gradual onset, persistent, progressively worsening SOB over the past 1 month.  Associated with feet and ankle swelling.  Patient also has positive home pregnancy test and missed menses.     Hospital Course by Problem:   Exertional dyspnea - with extensive h/o PEs in past -CTA negative for PE but patient needs lifelong anticoagulation -unable to afford lovenox through insurance -will attempt to go through [email or web address] if unable to find a way to get lovenox covered (high deductible) -suspect patient is deconditioned  Pregnancy test positive -  -outpatient GYN follow up  Morbid obesity Body mass index is 53.69 kg/m. -will need evaluation of OSA as well      Medical Consultants:      Discharge Exam:   Vitals:   02/25/17 2153 02/26/17 0455  BP: (!) 103/49 (!) 114/55  Pulse: (!) 109 (!) 108  Resp: 17 18  Temp: 98.1 F (36.7 C) 99.2 F (37.3  C)  SpO2: 98% 98%   Vitals:   02/25/17 1346 02/25/17 2153 02/26/17 0455 02/26/17 0606  BP: 109/62 (!) 103/49 (!) 114/55   Pulse: (!) 106 (!) 109 (!) 108   Resp: 18 17 18    Temp: 97.9 F (36.6 C) 98.1 F (36.7 C) 99.2 F (37.3 C)   TempSrc: Oral Oral Oral   SpO2: 97% 98% 98%   Weight:    135.3 kg (298 lb 4.5 oz)    Gen:  NAD    The results of significant diagnostics from this hospitalization (including imaging, microbiology, ancillary and laboratory) are listed below for reference.     Procedures and Diagnostic Studies:   Ct Angio Chest Pe W And/or Wo Contrast  Result Date: 02/24/2017 CLINICAL DATA:  Gradual onset of progressively worsening shortness of breath over the past month. Dyspnea on exertion and lightheadedness. EXAM: CT ANGIOGRAPHY CHEST WITH CONTRAST TECHNIQUE: Multidetector CT imaging of the chest was performed using the standard protocol during bolus administration of intravenous contrast. Multiplanar CT image reconstructions and MIPs were obtained to evaluate the vascular anatomy. CONTRAST:  100 ml ISOVUE-370 IOPAMIDOL (ISOVUE-370) INJECTION 76% COMPARISON:  CT chest 01/18/2015.  PA and lateral chest 02/17/2016. FINDINGS: Cardiovascular: The study is somewhat limited by bolus timing. No pulmonary embolus is identified. Heart size is upper normal. No pericardial effusion. Mediastinum/Nodes: No lymphadenopathy. Small hiatal hernia noted. Thyroid is unremarkable in appearance. Lungs/Pleura: No pleural effusion. Lungs show only mild dependent atelectasis. Upper Abdomen: Upper abdomen demonstrates fatty infiltration of the liver. Musculoskeletal: No acute bony abnormality. 1.5 cm nodule in  the left breast measures 1.9 cm on the prior exam. Review of the MIP images confirms the above findings. IMPRESSION: Negative for pulmonary embolus or acute disease. Small hiatal hernia. Fatty infiltration of the liver. Electronically Signed   By: Inge Rise M.D.   On: 02/24/2017 16:01      Labs:   Basic Metabolic Panel: Recent Labs  Lab 02/23/17 2117  NA 138  K 3.7  CL 103  CO2 23  GLUCOSE 99  BUN 8  CREATININE 0.88  CALCIUM 8.9   GFR Estimated Creatinine Clearance: 124.1 mL/min (by C-G formula based on SCr of 0.88 mg/dL). Liver Function Tests: No results for input(s): AST, ALT, ALKPHOS, BILITOT, PROT, ALBUMIN in the last 168 hours. No results for input(s): LIPASE, AMYLASE in the last 168 hours. No results for input(s): AMMONIA in the last 168 hours. Coagulation profile No results for input(s): INR, PROTIME in the last 168 hours.  CBC: Recent Labs  Lab 02/23/17 2117 02/25/17 0216 02/26/17 0304  WBC 14.6* 14.2* 13.8*  HGB 12.5 12.2 11.9*  HCT 39.0 38.0 38.0  MCV 80.7 80.3 81.0  PLT 391 399 402*   Cardiac Enzymes: No results for input(s): CKTOTAL, CKMB, CKMBINDEX, TROPONINI in the last 168 hours. BNP: Invalid input(s): POCBNP CBG: No results for input(s): GLUCAP in the last 168 hours. D-Dimer No results for input(s): DDIMER in the last 72 hours. Hgb A1c No results for input(s): HGBA1C in the last 72 hours. Lipid Profile No results for input(s): CHOL, HDL, LDLCALC, TRIG, CHOLHDL, LDLDIRECT in the last 72 hours. Thyroid function studies No results for input(s): TSH, T4TOTAL, T3FREE, THYROIDAB in the last 72 hours.  Invalid input(s): FREET3 Anemia work up No results for input(s): VITAMINB12, FOLATE, FERRITIN, TIBC, IRON, RETICCTPCT in the last 72 hours. Microbiology No results found for this or any previous visit (from the past 240 hour(s)).   Discharge Instructions:   Discharge Instructions    Discharge instructions   Complete by:  As directed    Outpatient sleep study evaluation   Increase activity slowly   Complete by:  As directed      Allergies as of 02/26/2017   No Known Allergies     Medication List    STOP taking these medications   rivaroxaban 20 MG Tabs tablet Commonly known as:  XARELTO     TAKE these  medications   enoxaparin 150 MG/ML injection Commonly known as:  LOVENOX Inject 0.93 mLs (140 mg total) into the skin every 12 (twelve) hours.   prenatal multivitamin Tabs tablet Take 1 tablet by mouth daily at 12 noon.      Follow-up [place] Follow up on 02/28/2017.   Why:  OB appointment scheduled for 02/28/2017 at 2:55 pm at the [facility] with Dr. Kandis Ban information: [place] [id number] [phone]           Time coordinating discharge: 35 min  Signed:  Geradine Girt   [facility] 02/26/2017, 1:09 PM

## 2017-02-26 NOTE — Progress Notes (Signed)
Enoxaparin was reviewed with the patient, including name, instructions (140 mg BID), indication, goals of therapy, potential side effects, importance of adherence, and safe use. Patient states she is anticipating expedited Medicaid enrollment due to pregnancy and is familiar with enoxaparin patient assistance program in case there is a delay in getting Medicaid. Patient was provided with 12 sample syringes with instructions to contact me post-discharge for further assistance with medication access.  Patient verbalized understanding by repeating back information and was also provided an information handout.

## 2017-02-26 NOTE — Progress Notes (Signed)
CM called and left voice message requesting  internal medicine appointment for post hospital f/u with [facility], awaiting call back. Whitman Hero RN,BSN,CM

## 2017-02-26 NOTE — Progress Notes (Signed)
[employer] Patient Writer  Form for Lovenox completed and faxed to [phone]. CM spoke with customer service @ [phone] and learned  processing time is within 2-3 business days and once application is approved shipping is with 3-5 business days. If pt is denied they will make contact with pt and also notify [facility]/ Dr. Atlee Abide through fax, [phone]. Whitman Hero RN,BSN,CM

## 2017-02-26 NOTE — Progress Notes (Unsigned)
[place] was contacted by Levada Dy (Case Manager) today seeking assistance for this patient for Lovenox. Levada Dy explained to me that Ms. Csaszar has insurance but her co-pay for Lovenox is very high. Levada Dy also explained to me that she has been trying to enroll the patient in [employer]'s Patient Assistance Program for Lovenox. I explained on the phone to Levada Dy that [employer] is usually quick to respond with an approval or denial of benefits and that she should be hearing from them in a few days.  Levada Dy asked if our pharmacy could help this patient while waiting for patient assistance to be approved. I explained that we are a limited pharmacy and we only fill for patients who have established care at [facility] [place]. I told her I would refer her inquiry to my supervisor. My supervisor, Kerby Moors, said that we cannot help this patient with the cost of her Lovenox at this time because she has not established care with our doctors and she has Pharmacist, community.   I have tried several times to call Levada Dy back this afternoon at [phone] but I only heard a busy signal on the other end.   Thanks!

## 2017-02-26 NOTE — Care Management Note (Signed)
Case Management Note  Patient Details  Name: Virginia Fitzgerald MRN: [id number] Date of Birth: [birth year]  Subjective/Objective:      Admitted with DOE, hx of anemia, DVT, PE , iron deficiency anemia, positive home pregnancy test. From home with family. Independent with ADL's, no DME usage. Pt without PCP.   Action/Plan: Transition to home today. Pt with 12 day supply of enoxaparin syringes. Pt states knows how to give medication and will contact  Flossie Dibble (pharmacist) @ [phone] with question or problems obtaining more enoxaparin if needed.     IM clinic to f/u with pt with post hospital f/u appointment.  OB appointment scheduled for 02/28/2017 at 2:55 pm at the [facility] with Dr. Ilda Basset.       Pt states has transportation to home.  Expected Discharge Date:  02/26/17               Expected Discharge Plan:  Home/Self Care  In-House Referral:     Discharge planning Services  Follow-up appt scheduled, CM Consult  Post Acute Care Choice:    Choice offered to:     DME Arranged:    DME Agency:     HH Arranged:    HH Agency:     Status of Service:  Completed, signed off  If discussed at [employer] of Stay Meetings, dates discussed:    Additional Comments:  Sharin Mons, RN 02/26/2017, 1:57 PM

## 2017-02-26 NOTE — Progress Notes (Signed)
Virginia Fitzgerald to be D/C'd Home per MD order.  Discussed with the patient and all questions fully answered.  VSS, Skin clean, dry and intact without evidence of skin break down, no evidence of skin tears noted. IV catheter discontinued intact. Site without signs and symptoms of complications. Dressing and pressure applied.  An After Visit Summary was printed and given to the patient. Patient received prescription.  D/c education completed with patient/family including follow up instructions, medication list, d/c activities limitations if indicated, with other d/c instructions as indicated by MD - patient able to verbalize understanding, all questions fully answered.   Patient instructed to return to ED, call 911, or call MD for any changes in condition.   Patient escorted via [place], and D/C home via private auto.  Dorris Carnes 02/26/2017 6:02 PM

## 2017-02-28 ENCOUNTER — Encounter: Payer: Self-pay | Admitting: Obstetrics and Gynecology

## 2017-02-28 ENCOUNTER — Ambulatory Visit (INDEPENDENT_AMBULATORY_CARE_PROVIDER_SITE_OTHER): Payer: BLUE CROSS/BLUE SHIELD | Admitting: Obstetrics and Gynecology

## 2017-02-28 VITALS — BP 132/83 | HR 116 | Ht 62.0 in | Wt 300.4 lb

## 2017-02-28 DIAGNOSIS — O3680X0 Pregnancy with inconclusive fetal viability, not applicable or unspecified: Secondary | ICD-10-CM

## 2017-02-28 DIAGNOSIS — Z3481 Encounter for supervision of other normal pregnancy, first trimester: Secondary | ICD-10-CM

## 2017-02-28 DIAGNOSIS — Z6841 Body Mass Index (BMI) 40.0 and over, adult: Secondary | ICD-10-CM | POA: Insufficient documentation

## 2017-02-28 NOTE — Progress Notes (Signed)
[facility] 02/28/2017  CC: early pregnancy, pregnancy of unknown location  Subjective:  32 y/o G3P3 (LMP: unknown, irregular) with above CC. Preg c/b BMI 50s, h/o VTE and need for lifelong coagulation. Pt went to ED for SOB and was admitted from 1/27-29 and dx with exertional SOB. Echo and other imaging unremarkable and she was started on bid lovenox 140; she wasn't on anything before this due to costs, per patient.  She currently denies any CV or pulm s/s and no s/s of SAB and she states that she is taking the lovenox.   Pt states she was dx with VTE after last pregnancy when she had SOB and it looks like it was in the 6wk period after her 03/2009 vaginal delivery.  Obstetric History OB History  Gravida Para Term Preterm AB Living  4 3 3  0 0 2  SAB TAB Ectopic Multiple Live Births  0 0 0   2    # Outcome Date GA Lbr Len/2nd Weight Sex Delivery Anes PTL Lv  4 Current           3 Term 03/20/09    M Vag-Spont     2 Term 04/07/08    M Vag-Spont   LIV  1 Term 06/28/02    F Vag-Spont   LIV       Review of Systems Pertinent items are noted in HPI.    Objective:   NAD  Assessment:  Pt stable  Plan:  I told her to continue lovenox at current dosing and will need to be on that for entire pregnancy. I told her that she currently has a preg of unknown location; ectopic precautions given and will do formal beta hcg. istat beta in ER on 1/26 was 219. Will f/u number and schedule u/s based on this. Pt told to start prenatal vitamin  She denies any abdominal surgery so this was removed from her history   RTC: tentative new ob visit made for 2-3wks

## 2017-03-01 ENCOUNTER — Telehealth: Payer: Self-pay | Admitting: *Deleted

## 2017-03-01 ENCOUNTER — Other Ambulatory Visit: Payer: Self-pay | Admitting: Obstetrics and Gynecology

## 2017-03-01 DIAGNOSIS — Z3490 Encounter for supervision of normal pregnancy, unspecified, unspecified trimester: Secondary | ICD-10-CM

## 2017-03-01 DIAGNOSIS — N3090 Cystitis, unspecified without hematuria: Secondary | ICD-10-CM

## 2017-03-01 DIAGNOSIS — O3680X0 Pregnancy with inconclusive fetal viability, not applicable or unspecified: Secondary | ICD-10-CM

## 2017-03-01 HISTORY — DX: Cystitis, unspecified without hematuria: N30.90

## 2017-03-01 LAB — BETA HCG QUANT (REF LAB): HCG QUANT: 1376 m[IU]/mL

## 2017-03-01 NOTE — Telephone Encounter (Signed)
Called pt and informed her of BHCG results from yesterday. Pt was also advised that she is still at risk for ectopic pregnancy. If she develops pelvic pain, cramping or vaginal bleeding she should return to [facility]U for evaluation. Dr. Ilda Basset would like her to have [name] in 2 weeks to evaluate the progress of her pregnancy. appt scheduled on 2/14 @ 0900. Pt advised to have a full bladder for the [name]. Pt agreed to plan of care and voiced understanding of all information and instructions given.

## 2017-03-05 ENCOUNTER — Ambulatory Visit: Payer: BLUE CROSS/BLUE SHIELD | Admitting: Pharmacist

## 2017-03-06 ENCOUNTER — Ambulatory Visit: Payer: BLUE CROSS/BLUE SHIELD | Admitting: Pharmacist

## 2017-03-07 ENCOUNTER — Ambulatory Visit: Payer: BLUE CROSS/BLUE SHIELD | Admitting: Pharmacist

## 2017-03-13 ENCOUNTER — Other Ambulatory Visit: Payer: Self-pay | Admitting: Pharmacist

## 2017-03-13 DIAGNOSIS — Z86711 Personal history of pulmonary embolism: Secondary | ICD-10-CM

## 2017-03-13 MED ORDER — ENOXAPARIN SODIUM 150 MG/ML ~~LOC~~ SOLN: 140.0000 mg | Freq: Two times a day (BID) | SUBCUTANEOUS | 3 refills | Status: DC

## 2017-03-14 ENCOUNTER — Ambulatory Visit (HOSPITAL_COMMUNITY)
Admission: RE | Admit: 2017-03-14 | Discharge: 2017-03-14 | Disposition: A | Discharge status: Home / Self Care | Payer: BLUE CROSS/BLUE SHIELD | Source: Ambulatory Visit | Attending: Obstetrics and Gynecology | Admitting: Obstetrics and Gynecology

## 2017-03-14 ENCOUNTER — Ambulatory Visit: Payer: BLUE CROSS/BLUE SHIELD | Admitting: General Practice

## 2017-03-14 DIAGNOSIS — O3680X0 Pregnancy with inconclusive fetal viability, not applicable or unspecified: Secondary | ICD-10-CM | POA: Diagnosis present

## 2017-03-14 DIAGNOSIS — Z712 Person consulting for explanation of examination or test findings: Secondary | ICD-10-CM

## 2017-03-14 DIAGNOSIS — O3411 Maternal care for benign tumor of corpus uteri, first trimester: Secondary | ICD-10-CM | POA: Insufficient documentation

## 2017-03-14 DIAGNOSIS — Z3A01 Less than 8 weeks gestation of pregnancy: Secondary | ICD-10-CM | POA: Diagnosis not present

## 2017-03-14 NOTE — Progress Notes (Signed)
Chart reviewed for nurse visit. Agree with plan of care.   Starr [place], [place] 03/14/2017 12:29 PM

## 2017-03-14 NOTE — Progress Notes (Signed)
Patient here for viability results today. Reviewed with Maye Hides who finds living IUP. Patient may follow up at [facility] appt next week.  Informed patient of results, reviewed dating, & provided picture. Patient will need office scan for FHR next week due to early gestation. Patient verbalized understanding to all & had no questions

## 2017-03-20 ENCOUNTER — Encounter: Payer: Self-pay | Admitting: Obstetrics and Gynecology

## 2017-03-20 ENCOUNTER — Other Ambulatory Visit: Payer: Self-pay

## 2017-03-20 ENCOUNTER — Ambulatory Visit (INDEPENDENT_AMBULATORY_CARE_PROVIDER_SITE_OTHER): Payer: BLUE CROSS/BLUE SHIELD | Admitting: Obstetrics and Gynecology

## 2017-03-20 VITALS — BP 135/92 | HR 110 | Wt 304.0 lb

## 2017-03-20 DIAGNOSIS — I10 Essential (primary) hypertension: Secondary | ICD-10-CM | POA: Insufficient documentation

## 2017-03-20 DIAGNOSIS — O88219 Thromboembolism in pregnancy, unspecified trimester: Secondary | ICD-10-CM | POA: Insufficient documentation

## 2017-03-20 DIAGNOSIS — O99210 Obesity complicating pregnancy, unspecified trimester: Secondary | ICD-10-CM

## 2017-03-20 DIAGNOSIS — Z6841 Body Mass Index (BMI) 40.0 and over, adult: Secondary | ICD-10-CM

## 2017-03-20 DIAGNOSIS — O0990 Supervision of high risk pregnancy, unspecified, unspecified trimester: Secondary | ICD-10-CM

## 2017-03-20 DIAGNOSIS — Z1151 Encounter for screening for human papillomavirus (HPV): Secondary | ICD-10-CM

## 2017-03-20 DIAGNOSIS — Z124 Encounter for screening for malignant neoplasm of cervix: Secondary | ICD-10-CM | POA: Diagnosis not present

## 2017-03-20 DIAGNOSIS — Z113 Encounter for screening for infections with a predominantly sexual mode of transmission: Secondary | ICD-10-CM | POA: Diagnosis not present

## 2017-03-20 DIAGNOSIS — Z23 Encounter for immunization: Secondary | ICD-10-CM

## 2017-03-20 DIAGNOSIS — O10919 Unspecified pre-existing hypertension complicating pregnancy, unspecified trimester: Secondary | ICD-10-CM

## 2017-03-20 DIAGNOSIS — R8290 Unspecified abnormal findings in urine: Secondary | ICD-10-CM

## 2017-03-20 DIAGNOSIS — I2699 Other pulmonary embolism without acute cor pulmonale: Secondary | ICD-10-CM | POA: Insufficient documentation

## 2017-03-20 LAB — POCT URINALYSIS DIP (DEVICE)
BILIRUBIN URINE: NEGATIVE
GLUCOSE, UA: NEGATIVE mg/dL
Hgb urine dipstick: NEGATIVE
KETONES UR: NEGATIVE mg/dL
Nitrite: POSITIVE — AB
Protein, ur: NEGATIVE mg/dL
SPECIFIC GRAVITY, URINE: 1.02 (ref 1.005–1.030)
Urobilinogen, UA: 0.2 mg/dL (ref 0.0–1.0)
pH: 7.5 (ref 5.0–8.0)

## 2017-03-20 MED ORDER — NITROFURANTOIN MONOHYD MACRO 100 MG PO CAPS
100.0000 mg | ORAL_CAPSULE | Freq: Two times a day (BID) | ORAL | 0 refills | Status: DC
Start: 2017-03-20 — End: 2017-05-26

## 2017-03-20 NOTE — Progress Notes (Signed)
New OB Note  03/20/2017   Clinic[facility]  Chief Complaint: NOB  Transfer of Care Patient: no  History of Present Illness: Virginia Fitzgerald is a 32 y.o. [id number] @ 7/3 weeks ([place] 10/6, based on 6wk u/s) Patient's last menstrual period was 01/06/2017 (exact date).  Preg complicated by has Iron deficiency anemia; BMI 50.0-59.9, adult ([place]); Supervision of high risk pregnancy, antepartum; Chronic hypertension during pregnancy, antepartum; Obesity in pregnancy; Pulmonary embolism in during pregnancy, antepartum; and Recurrent pulmonary embolism (HCC) on their problem list.   Any events prior to today's visit: yes, dx with PE in late january She was using no method when she conceived.  She has Positive signs or symptoms of nausea/vomiting of pregnancy; mild She has Negative signs or symptoms of miscarriage or preterm labor On any medications around the time she conceived/early pregnancy: No   ROS: A 12-point review of systems was performed and negative, except as stated in the above HPI.  OBGYN History: As per HPI. OB History  Gravida Para Term Preterm AB Living  4 3 3  0 0 3  SAB TAB Ectopic Multiple Live Births  0 0 0   3    # Outcome Date GA Lbr Len/2nd Weight Sex Delivery Anes PTL Lv  4 Current           3 Term 03/20/09    M Vag-Spont   LIV  2 Term 04/07/08    M Vag-Spont   LIV  1 Term 06/28/02    F Vag-Spont   LIV    Obstetric Comments  Largest prior was 7lbs 7oz. No issues with deliveries.     Any issues with any prior pregnancies: no Prior children are healthy, doing well, and without any problems or issues: yes History of pap smears: Yes. Last pap smear 2013 and results were NILM History of STIs: Yes   Past Medical History: Past Medical History:  Diagnosis Date  . Anemia   . History of DVT of lower extremity   . MRSA infection   . Pulmonary embolism ([place])   . S/P IVC filter   . Thrombocytosis ([place])     Past Surgical History: Past Surgical  History:  Procedure Laterality Date  . NO PAST SURGERIES      Family History:  Family History  Problem Relation Age of Onset  . Diabetes Mother   . Hypertension Mother   . Hypertension Maternal Uncle   . Diabetes Maternal Uncle   . Hypertension Maternal Grandmother    She denies any female cancers, bleeding or blood clotting disorders.  She denies any history of mental retardation, birth defects or genetic disorders in her or the FOB's history  Social History:  Social History   Socioeconomic History  . Marital status: Single    Spouse name: Not on file  . Number of children: Not on file  . Years of education: Not on file  . Highest education level: Not on file  Social Needs  . Financial resource strain: Not on file  . Food insecurity - worry: Not on file  . Food insecurity - inability: Not on file  . Transportation needs - medical: Not on file  . Transportation needs - non-medical: Not on file  Occupational History  . Not on file  Tobacco Use  . Smoking status: Never Smoker  . Smokeless tobacco: Never Used  . Tobacco comment: never used tobacco  Substance and Sexual Activity  . Alcohol use: No    Alcohol/week:  0.0 oz  . Drug use: No  . Sexual activity: Not Currently    Birth control/protection: None    Comment: has used mirena  Other Topics Concern  . 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   Vag. Bleeding: None. Fundal height: not applicable FHTs: normal FHR on bedside u/s  General appearance: Well nourished, well developed female in no acute distress.  Neck:  Supple, normal  appearance, and no thyromegaly  Cardiovascular: S1, S2 normal, no murmur, rub or gallop, regular rate and rhythm Respiratory:  Clear to auscultation bilateral. Normal respiratory effort Abdomen: positive bowel sounds and no masses, hernias; diffusely non tender to palpation, non distended Breasts: patient declines any breast s/s Neuro/Psych:  Normal mood and affect.  Skin:  Warm and dry.  Lymphatic:  No inguinal lymphadenopathy.   Pelvic exam: is limited by body habitus EGBUS: within normal limits, Vagina: within normal limits and with no blood in the vault, Cervix: normal appearing cervix without discharge or lesions, closed/long/high, Uterus:  nonenlarged, and Adnexa:  normal adnexa and no mass, fullness, tenderness  Laboratory: none  Imaging:  CLINICAL DATA:  Evaluate viability.  EXAM: OBSTETRIC <14 WK [name] AND TRANSVAGINAL OB US  TECHNIQUE: Both transabdominal and transvaginal ultrasound examinations were performed for complete evaluation of the gestation as well as the maternal uterus, adnexal regions, and pelvic cul-de-sac. Transvaginal technique was performed to assess early pregnancy.  COMPARISON:  None  FINDINGS: Intrauterine gestational sac: Single  Yolk sac:  Visualized.  Embryo:  Visualized.  Cardiac Activity: Visualized.  Heart Rate: 124 bpm  MSD:   mm    w     d  CRL:  7.7 mm   6 w   4 d                  [name] EDC: 11/03/2017  Maternal uterus/adnexae:  Subchorionic hemorrhage: None  Right ovary: Normal  Left ovary: Normal  Other :Right fundal fibroid measures 2.1 x 1.8 x 2.1 cm.  Free fluid:  None  IMPRESSION: 1. Single living intrauterine gestation with an estimated gestational age of [redacted] weeks and 4 days. 2. Small fundal fibroid.   Electronically Signed   By: Kerby Moors M.D.   On: 03/14/2017 09:33  Assessment: pt doing well  Plan: 1. Supervision of high risk pregnancy, antepartum Routine care. Pt amenable to genetic  screening. cffdna at 10wks or greater - Culture, OB Urine - Cystic fibrosis gene test - Cytology - PAP - Flu Vaccine QUAD 36+ mos IM - Hemoglobinopathy Evaluation - Obstetric Panel, Including HIV - SMN1 COPY NUMBER ANALYSIS (SMA Carrier Screen) - Protein / creatinine ratio, urine - Comprehensive metabolic panel - TSH - HgB A1c  2. Needs flu shot - Flu Vaccine QUAD 36+ mos IM  3. Abnormal urinalysis +nitrites. Will treat with macrobid. F/u ucx  4. Obesity in pregnancy Baseline pre-x labs, tsh and a1c  5. BMI 50.0-59.9, adult ([place])  6. Pulmonary embolism in during pregnancy, antepartum Pt confirms lovenox 140 bid. Continue during pregnancy  and will need anti-coag lifelong  7. Chronic hypertension during pregnancy, antepartum On no meds. Continue to follow. Start baby asa after 12wks.  Problem list reviewed and updated.  Follow up in 3 weeks.  >50% of 25 min visit spent on counseling and coordination of care.     Durene Romans MD Attending [facility])

## 2017-03-20 NOTE — Progress Notes (Signed)
FHR obtained with bedside US via M-mode.

## 2017-03-23 LAB — URINE CULTURE, OB REFLEX

## 2017-03-23 LAB — CULTURE, OB URINE

## 2017-03-25 ENCOUNTER — Telehealth: Payer: Self-pay | Admitting: Obstetrics and Gynecology

## 2017-03-25 ENCOUNTER — Encounter: Payer: Self-pay | Admitting: Obstetrics and Gynecology

## 2017-03-25 DIAGNOSIS — O9989 Other specified diseases and conditions complicating pregnancy, childbirth and the puerperium: Secondary | ICD-10-CM

## 2017-03-25 DIAGNOSIS — R8271 Bacteriuria: Secondary | ICD-10-CM | POA: Insufficient documentation

## 2017-03-25 DIAGNOSIS — O99891 Other specified diseases and conditions complicating pregnancy: Secondary | ICD-10-CM | POA: Insufficient documentation

## 2017-03-26 LAB — CYTOLOGY - PAP
Chlamydia: NEGATIVE
Diagnosis: UNDETERMINED — AB
HPV: DETECTED — AB
NEISSERIA GONORRHEA: NEGATIVE

## 2017-03-27 LAB — COMPREHENSIVE METABOLIC PANEL
A/G RATIO: 1.4 (ref 1.2–2.2)
ALT: 24 IU/L (ref 0–32)
AST: 10 IU/L (ref 0–40)
Albumin: 4.1 g/dL (ref 3.5–5.5)
Alkaline Phosphatase: 83 IU/L (ref 39–117)
BUN/Creatinine Ratio: 11 (ref 9–23)
BUN: 8 mg/dL (ref 6–20)
Bilirubin Total: <0.2 mg/dL (ref 0.0–1.2)
CALCIUM: 9.7 mg/dL (ref 8.7–10.2)
CO2: 21 mmol/L (ref 20–29)
Chloride: 100 mmol/L (ref 96–106)
Creatinine, Ser: 0.73 mg/dL (ref 0.57–1.00)
GFR calc Af Amer: 127 mL/min/{1.73_m2} (ref >59)
GFR, EST NON AFRICAN AMERICAN: 110 mL/min/{1.73_m2} (ref >59)
GLUCOSE: 96 mg/dL (ref 65–99)
Globulin, Total: 3 g/dL (ref 1.5–4.5)
POTASSIUM: 4.6 mmol/L (ref 3.5–5.2)
Sodium: 136 mmol/L (ref 134–144)
Total Protein: 7.1 g/dL (ref 6.0–8.5)

## 2017-03-27 LAB — HEMOGLOBINOPATHY EVALUATION
FERRITIN: 101 ng/mL (ref 15–150)
HGB A2 QUANT: 2.1 % (ref 1.8–3.2)
HGB A: 97.9 % (ref 96.4–98.8)
HGB C: 0 %
HGB F QUANT: 0 % (ref 0.0–2.0)
Hgb S: 0 %
Hgb Solubility: NEGATIVE
Hgb Variant: 0 %

## 2017-03-27 LAB — SMN1 COPY NUMBER ANALYSIS (SMA CARRIER SCREENING)

## 2017-03-27 LAB — OBSTETRIC PANEL, INCLUDING HIV
ANTIBODY SCREEN: NEGATIVE
BASOS: 0 %
Basophils Absolute: 0 10*3/uL (ref 0.0–0.2)
EOS (ABSOLUTE): 0.3 10*3/uL (ref 0.0–0.4)
Eos: 2 %
HEMATOCRIT: 36.3 % (ref 34.0–46.6)
HEP B S AG: NEGATIVE
HIV SCREEN 4TH GENERATION: NONREACTIVE
Hemoglobin: 11.9 g/dL (ref 11.1–15.9)
Immature Grans (Abs): 0.1 10*3/uL (ref 0.0–0.1)
Immature Granulocytes: 1 %
LYMPHS ABS: 2.4 10*3/uL (ref 0.7–3.1)
Lymphs: 19 %
MCH: 25.2 pg — ABNORMAL LOW (ref 26.6–33.0)
MCHC: 32.8 g/dL (ref 31.5–35.7)
MCV: 77 fL — AB (ref 79–97)
MONOCYTES: 6 %
Monocytes Absolute: 0.7 10*3/uL (ref 0.1–0.9)
NEUTROS ABS: 9.4 10*3/uL — AB (ref 1.4–7.0)
Neutrophils: 72 %
PLATELETS: 372 10*3/uL (ref 150–379)
RBC: 4.73 x10E6/uL (ref 3.77–5.28)
RDW: 15.4 % (ref 12.3–15.4)
RPR: NONREACTIVE
Rh Factor: POSITIVE
WBC: 12.7 10*3/uL — AB (ref 3.4–10.8)

## 2017-03-27 LAB — TSH: TSH: 2.26 u[IU]/mL (ref 0.450–4.500)

## 2017-03-27 LAB — HEMOGLOBIN A1C
ESTIMATED AVERAGE GLUCOSE: 140 mg/dL
Hgb A1c MFr Bld: 6.5 % — ABNORMAL HIGH (ref 4.8–5.6)

## 2017-03-27 LAB — PROTEIN / CREATININE RATIO, URINE
Creatinine, Urine: 152.8 mg/dL
PROTEIN UR: 33.1 mg/dL
PROTEIN/CREAT RATIO: 217 mg/g{creat} — AB (ref 0–200)

## 2017-03-27 LAB — CYSTIC FIBROSIS GENE TEST

## 2017-03-27 LAB — ALPHA-THALASSEMIA

## 2017-03-28 ENCOUNTER — Telehealth: Payer: Self-pay | Admitting: General Practice

## 2017-03-28 ENCOUNTER — Encounter: Payer: Self-pay | Admitting: Obstetrics and Gynecology

## 2017-03-28 ENCOUNTER — Other Ambulatory Visit: Payer: Self-pay | Admitting: Obstetrics and Gynecology

## 2017-03-28 DIAGNOSIS — N870 Mild cervical dysplasia: Secondary | ICD-10-CM | POA: Insufficient documentation

## 2017-03-28 DIAGNOSIS — E119 Type 2 diabetes mellitus without complications: Secondary | ICD-10-CM

## 2017-03-28 DIAGNOSIS — O24410 Gestational diabetes mellitus in pregnancy, diet controlled: Secondary | ICD-10-CM

## 2017-03-28 HISTORY — DX: Type 2 diabetes mellitus without complications: E11.9

## 2017-03-28 MED ORDER — ACCU-CHEK GUIDE W/DEVICE KIT: 1.0000 | PACK | Freq: Once | 0 refills | Status: AC

## 2017-03-28 MED ORDER — GLUCOSE BLOOD VI STRP: ORAL_STRIP | 12 refills | Status: DC

## 2017-03-28 MED ORDER — ACCU-CHEK FASTCLIX LANCETS MISC: 1.0000 | Freq: Four times a day (QID) | 12 refills | Status: DC

## 2017-03-28 NOTE — Telephone Encounter (Signed)
-----   Message from Aletha Halim, MD sent at 03/28/2017  8:19 AM EST ----- Please tell her that her pap was abnormal and she needs a colpo. Also, she has an elevated a1c and has diabetes in pregnancy. Please set her up with nutrition, supplies, dm education, etc. thanks

## 2017-03-28 NOTE — Telephone Encounter (Signed)
Called patient and informed her of all results & recommended follow ups. Patient verbalized understanding to all & will come in for appt with Bev 3/5 @ 10am for diabetes ed. Patient also aware of testing supplies sent to pharmacy. Patient had no questions

## 2017-03-29 ENCOUNTER — Telehealth: Payer: Self-pay | Admitting: General Practice

## 2017-03-29 NOTE — Telephone Encounter (Signed)
Called and notified patient of appointment scheduled for 04/02/17 at 9:00am with Diabetes Educator.  Patient voiced understanding.

## 2017-04-02 ENCOUNTER — Other Ambulatory Visit: Payer: BLUE CROSS/BLUE SHIELD

## 2017-04-10 ENCOUNTER — Ambulatory Visit (INDEPENDENT_AMBULATORY_CARE_PROVIDER_SITE_OTHER): Payer: BLUE CROSS/BLUE SHIELD | Admitting: Family Medicine

## 2017-04-10 ENCOUNTER — Encounter: Payer: Self-pay | Admitting: Family Medicine

## 2017-04-10 ENCOUNTER — Other Ambulatory Visit (HOSPITAL_COMMUNITY)
Admission: RE | Admit: 2017-04-10 | Discharge: 2017-04-10 | Disposition: A | Discharge status: Home / Self Care | Payer: BLUE CROSS/BLUE SHIELD | Source: Ambulatory Visit | Attending: Family Medicine | Admitting: Family Medicine

## 2017-04-10 ENCOUNTER — Ambulatory Visit (HOSPITAL_COMMUNITY)
Admission: RE | Admit: 2017-04-10 | Discharge: 2017-04-10 | Disposition: A | Discharge status: Home / Self Care | Payer: BLUE CROSS/BLUE SHIELD | Source: Ambulatory Visit | Attending: Family Medicine | Admitting: Family Medicine

## 2017-04-10 VITALS — BP 119/70 | HR 109 | Wt 307.4 lb

## 2017-04-10 DIAGNOSIS — R87810 Cervical high risk human papillomavirus (HPV) DNA test positive: Secondary | ICD-10-CM

## 2017-04-10 DIAGNOSIS — Z3A01 Less than 8 weeks gestation of pregnancy: Secondary | ICD-10-CM | POA: Diagnosis not present

## 2017-04-10 DIAGNOSIS — O0991 Supervision of high risk pregnancy, unspecified, first trimester: Secondary | ICD-10-CM | POA: Diagnosis present

## 2017-04-10 DIAGNOSIS — R87610 Atypical squamous cells of undetermined significance on cytologic smear of cervix (ASC-US): Secondary | ICD-10-CM

## 2017-04-10 DIAGNOSIS — O10919 Unspecified pre-existing hypertension complicating pregnancy, unspecified trimester: Secondary | ICD-10-CM

## 2017-04-10 DIAGNOSIS — O88211 Thromboembolism in pregnancy, first trimester: Secondary | ICD-10-CM

## 2017-04-10 DIAGNOSIS — O0990 Supervision of high risk pregnancy, unspecified, unspecified trimester: Secondary | ICD-10-CM

## 2017-04-10 DIAGNOSIS — O368310 Maternal care for abnormalities of the fetal heart rate or rhythm, first trimester, not applicable or unspecified: Secondary | ICD-10-CM | POA: Insufficient documentation

## 2017-04-10 DIAGNOSIS — O88219 Thromboembolism in pregnancy, unspecified trimester: Secondary | ICD-10-CM

## 2017-04-10 DIAGNOSIS — O24410 Gestational diabetes mellitus in pregnancy, diet controlled: Secondary | ICD-10-CM

## 2017-04-10 DIAGNOSIS — O021 Missed abortion: Secondary | ICD-10-CM

## 2017-04-10 DIAGNOSIS — O10911 Unspecified pre-existing hypertension complicating pregnancy, first trimester: Secondary | ICD-10-CM

## 2017-04-10 MED ORDER — ACCU-CHEK GUIDE W/DEVICE KIT: 1.0000 | PACK | Freq: Four times a day (QID) | 0 refills | Status: DC

## 2017-04-10 MED ORDER — ASPIRIN EC 81 MG PO TBEC: 81.0000 mg | DELAYED_RELEASE_TABLET | Freq: Every day | ORAL | 3 refills | Status: DC

## 2017-04-10 MED ORDER — GLUCOSE BLOOD VI STRP: ORAL_STRIP | 12 refills | Status: DC

## 2017-04-10 MED ORDER — MISOPROSTOL 200 MCG PO TABS: 800.0000 ug | ORAL_TABLET | Freq: Once | ORAL | 1 refills | Status: DC

## 2017-04-10 NOTE — Progress Notes (Signed)
PRENATAL VISIT NOTE  Subjective:  Virginia Fitzgerald is a 32 y.o. G4P3003 at 7w3dbeing seen today for ongoing prenatal care.  She is currently monitored for the following issues for this high-risk pregnancy and has Iron deficiency anemia; BMI 50.0-59.9, adult ([place]; Supervision of high risk pregnancy, antepartum; Chronic hypertension during pregnancy, antepartum; Obesity in pregnancy; Pulmonary embolism in during pregnancy, antepartum; Recurrent pulmonary embolism ([place]; Asymptomatic bacteriuria during pregnancy; ASCUS/HPV positive; and GDM diagnosed at 7 weeks on their problem list.  Patient reports no complaints.   . Vag. Bleeding: None.   . Denies leaking of fluid.   The following portions of the patient's history were reviewed and updated as appropriate: allergies, current medications, past family history, past medical history, past social history, past surgical history and problem list. Problem list updated.  Objective:   Vitals:   04/10/17 1332  BP: 119/70  Pulse: (!) 109  Weight: (!) 307 lb 6.4 oz (139.4 kg)    Fetal Status:           General:  Alert, oriented and cooperative. Patient is in no acute distress.  Skin: Skin is warm and dry. No rash noted.   Cardiovascular: Normal heart rate noted  Respiratory: Normal respiratory effort, no problems with respiration noted  Abdomen: Soft, gravid, appropriate for gestational age.  Pain/Pressure: Absent     Pelvic: Cervical exam deferred        Extremities: Normal range of motion.  Edema: None  Mental Status:  Normal mood and affect. Normal behavior. Normal judgment and thought content.  Procedure: Pap smear which showed AS-CUS with + HPV. Patient given informed consent, signed copy in the chart, time out was performed.  Placed in lithotomy position. Cervix viewed with speculum and colposcope after application of acetic acid.  Colposcopy adequate?  yes Acetowhite lesions? Small at 11 o'clock Punctation? yes Mosaicism?   no Abnormal vasculature?  no Biopsies? Yes at 11 o'clock ECC? no  Assessment and Plan:  Pregnancy: G4P3003 at 172w3d1. Chronic hypertension during pregnancy, antepartum BP is well controlled on no meds Baseline labs WNL Begin ASA at 12 wks. - aspirin EC 81 MG tablet; Take 1 tablet (81 mg total) by mouth daily.  Dispense: 90 tablet; Refill: 3  2. Pulmonary embolism in during pregnancy, antepartum Continue lovenox  3. Diet controlled gestational diabetes mellitus (GDM) in first trimester Missed appt with D and N Mgmt-->needs f/u asap Begin ASA at 12 wks - aspirin EC 81 MG tablet; Take 1 tablet (81 mg total) by mouth daily.  Dispense: 90 tablet; Refill: 3 - Blood Glucose Monitoring Suppl (ACCU-CHEK GUIDE) w/Device KIT; 1 Device by Does not apply route 4 (four) times daily.  Dispense: 1 kit; Refill: 0 - glucose blood (ACCU-CHEK GUIDE) test strip; Use as instructed QID  Dispense: 100 each; Refill: 12 - Ambulatory referral to Ophthalmology  4. Supervision of high risk pregnancy, antepartum NIPS ordered - Genetic Screening  5. ASCUS/HPV positive S/p colposcopy Patient was given post procedure instructions.   General obstetric precautions including but not limited to vaginal bleeding, contractions, leaking of fluid and fetal movement were reviewed in detail with the patient. Please refer to After Visit Summary for other counseling recommendations.  Return in 2 weeks (on 04/24/2017) for ob visit, needs MD, needs diabetes education ASAP.  Following the visit and procedure and No FHT's noted--patient had u/s showing 8 wk missed ab. Sent upstairs for further testing and TVUS confirmed missed ab at 7 wk 4 days. She  returned to the office and was offered misoprostol. Advised of bleeding precautions, repeat in 24 hours if no bleeding. Ibuprofen for pain. Possible need for D and C if not effective or heavy bleeding. Discussed reasons for SAB and timing of next pregnancy.  Donnamae Jude,  MD

## 2017-04-10 NOTE — Patient Instructions (Addendum)
Miscarriage A miscarriage is the loss of an unborn baby (fetus) before the 20th week of pregnancy. The cause is often unknown. Follow these instructions at home:  You may need to stay in bed (bed rest), or you may be able to do light activity. Go about activity as told by your doctor.  Have help at home.  Write down how many pads you use each day. Write down how soaked they are.  Do not use tampons. Do not wash out your vagina (douche) or have sex (intercourse) until your doctor approves.  Only take medicine as told by your doctor.  Do not take aspirin.  Keep all doctor visits as told.  If you or your partner have problems with grieving, talk to your doctor. You can also try counseling. Give yourself time to grieve before trying to get pregnant again. Get help right away if:  You have bad cramps or pain in your back or belly (abdomen).  You have a fever.  You pass large clumps of blood (clots) from your vagina that are walnut-sized or larger. Save the clumps for your doctor to see.  You pass large amounts of tissue from your vagina. Save the tissue for your doctor to see.  You have more bleeding.  You have thick, bad-smelling fluid (discharge) coming from the vagina.  You get lightheaded, weak, or you pass out (faint).  You have chills. This information is not intended to replace advice given to you by your health care provider. Make sure you discuss any questions you have with your health care provider. Document Released: 04/09/2011 Document Revised: 06/23/2015 Document Reviewed: 02/15/2011 Elsevier Interactive Patient Education  2017 [employer] After This sheet gives you information about how to care for yourself after your procedure. Your doctor may also give you more specific instructions. If you have problems or questions, contact your doctor. What can I expect after the procedure? If you did not have a tissue sample removed (did not have a  biopsy), you may only have some spotting for a few days. You can go back to your normal activities. If you had a tissue sample removed, it is common to have:  Soreness and pain. This may last for a few days.  Light-headedness.  Mild bleeding from your vagina or dark-colored, grainy discharge from your vagina. This may last for a few days. You may need to wear a sanitary pad.  Spotting for at least 48 hours after the procedure.  Follow these instructions at home:  Take over-the-counter and prescription medicines only as told by your doctor. Ask your doctor what medicines you can start taking again. This is very important if you take blood-thinning medicine.  Do not drive or use heavy machinery while taking prescription pain medicine.  For 3 days, or as long as your doctor tells you, avoid: ? Douching. ? Using tampons. ? Having sex.  If you use birth control (contraception), keep using it.  Limit activity for the first day after the procedure. Ask your doctor what activities are safe for you.  It is up to you to get the results of your procedure. Ask your doctor when your results will be ready.  Keep all follow-up visits as told by your doctor. This is important. Contact a doctor if:  You get a skin rash. Get help right away if:  You are bleeding a lot from your vagina. It is a lot of bleeding if you are using more than one pad an hour  for 2 hours in a row.  You have clumps of blood (blood clots) coming from your vagina.  You have a fever.  You have chills  You have pain in your lower belly (pelvic area).  You have signs of infection, such as vaginal discharge that is: ? Different than usual. ? Yellow. ? Bad-smelling.  You have very pain or cramps in your lower belly that do not get better with medicine.  You feel light-headed.  You feel dizzy.  You pass out (faint). Summary  If you did not have a tissue sample removed (did not have a biopsy), you may only have  some spotting for a few days. You can go back to your normal activities.  If you had a tissue sample removed, it is common to have mild pain and spotting for 48 hours.  For 3 days, or as long as your doctor tells you, avoid douching, using tampons and having sex.  Get help right away if you have bleeding, very bad pain, or signs of infection. This information is not intended to replace advice given to you by your health care provider. Make sure you discuss any questions you have with your health care provider. Document Released: 07/04/2007 Document Revised: 10/05/2015 Document Reviewed: 10/05/2015 Elsevier Interactive Patient Education  2018 [employer].

## 2017-04-10 NOTE — Progress Notes (Signed)
Pt dis not come to appt with diabetes educator, has not started cbg testing yet

## 2017-04-12 ENCOUNTER — Encounter: Payer: Self-pay | Admitting: Family Medicine

## 2017-04-17 ENCOUNTER — Telehealth: Payer: Self-pay

## 2017-04-17 NOTE — Telephone Encounter (Signed)
Called pt to inform her of her biopsy results per Dr. 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 Pt verbalized understanding and had no questions.  Marland Kitchen

## 2017-04-22 ENCOUNTER — Encounter: Payer: Self-pay | Admitting: *Deleted

## 2017-04-25 ENCOUNTER — Ambulatory Visit: Payer: BLUE CROSS/BLUE SHIELD | Admitting: Student

## 2017-05-26 ENCOUNTER — Inpatient Hospital Stay (HOSPITAL_COMMUNITY): Payer: BLUE CROSS/BLUE SHIELD

## 2017-05-26 ENCOUNTER — Inpatient Hospital Stay (HOSPITAL_COMMUNITY)
Admission: AD | Admit: 2017-05-26 | Discharge: 2017-05-26 | Disposition: A | Discharge status: Home / Self Care | Payer: BLUE CROSS/BLUE SHIELD | Source: Ambulatory Visit | Attending: Obstetrics and Gynecology | Admitting: Obstetrics and Gynecology

## 2017-05-26 ENCOUNTER — Encounter (HOSPITAL_COMMUNITY): Payer: Self-pay | Admitting: *Deleted

## 2017-05-26 ENCOUNTER — Other Ambulatory Visit: Payer: Self-pay

## 2017-05-26 DIAGNOSIS — O4690 Antepartum hemorrhage, unspecified, unspecified trimester: Secondary | ICD-10-CM

## 2017-05-26 DIAGNOSIS — R109 Unspecified abdominal pain: Secondary | ICD-10-CM

## 2017-05-26 DIAGNOSIS — O88219 Thromboembolism in pregnancy, unspecified trimester: Secondary | ICD-10-CM | POA: Diagnosis not present

## 2017-05-26 DIAGNOSIS — O021 Missed abortion: Secondary | ICD-10-CM | POA: Diagnosis not present

## 2017-05-26 DIAGNOSIS — Z86718 Personal history of other venous thrombosis and embolism: Secondary | ICD-10-CM | POA: Insufficient documentation

## 2017-05-26 DIAGNOSIS — Z86711 Personal history of pulmonary embolism: Secondary | ICD-10-CM | POA: Diagnosis not present

## 2017-05-26 LAB — CBC
HCT: 35.9 % — ABNORMAL LOW (ref 36.0–46.0)
Hemoglobin: 11.4 g/dL — ABNORMAL LOW (ref 12.0–15.0)
MCH: 24.6 pg — ABNORMAL LOW (ref 26.0–34.0)
MCHC: 31.8 g/dL (ref 30.0–36.0)
MCV: 77.4 fL — ABNORMAL LOW (ref 78.0–100.0)
Platelets: 299 10*3/uL (ref 150–400)
RBC: 4.64 MIL/uL (ref 3.87–5.11)
RDW: 14.8 % (ref 11.5–15.5)
WBC: 11.2 10*3/uL — ABNORMAL HIGH (ref 4.0–10.5)

## 2017-05-26 LAB — URINALYSIS, ROUTINE W REFLEX MICROSCOPIC
Bilirubin Urine: NEGATIVE
Glucose, UA: NEGATIVE mg/dL
Ketones, ur: NEGATIVE mg/dL
Nitrite: NEGATIVE
Protein, ur: NEGATIVE mg/dL
RBC / HPF: >50 RBC/hpf — ABNORMAL HIGH (ref 0–5)
Specific Gravity, Urine: 1.019 (ref 1.005–1.030)
pH: 6 (ref 5.0–8.0)

## 2017-05-26 LAB — WET PREP, GENITAL
Sperm: NONE SEEN
Trich, Wet Prep: NONE SEEN
Yeast Wet Prep HPF POC: NONE SEEN

## 2017-05-26 LAB — HCG, QUANTITATIVE, PREGNANCY: hCG, Beta Chain, Quant, S: 39 m[IU]/mL — ABNORMAL HIGH (ref <5)

## 2017-05-26 LAB — POCT PREGNANCY, URINE: PREG TEST UR: POSITIVE — AB

## 2017-05-26 NOTE — MAU Note (Signed)
Pt presents to MAU stating she had a +UPT on Friday 4/26. Pt states she had a miscarriage in March. Pt states she has had some spotting when she wiped after using the BR. Pt reports abdominal cramping that is constant and rated 7/10. Pt reports las intercourse Wednesday 4/24.

## 2017-05-26 NOTE — MAU Provider Note (Signed)
Chief Complaint: Abdominal Pain and Vaginal Bleeding   First Provider Initiated Contact with Patient 05/26/17 0125     SUBJECTIVE HPI: Virginia Fitzgerald is a 32 y.o. G5P3013 at Unknown gestational age by LMP who presents to maternity admissions reporting abdominal pain, vaginal bleeding and positive pregnancy test. She reports having a miscarriage on 04/10/17 which she was given Cytotec for to pass the products- bled for one week after medication. She reports having a positive HPT on Friday, started having abdominal cramping and vaginal bleeding Saturday morning. She describes the bleeding as bright red vaginal bleeding when she wipes in the restroom, she reports since arrival to MAU her vaginal bleeding increasing but not having to wear a pad. She describes the abdominal pain as constant cramping, rates pain 7/10- has not taken pain medication. She reports IC on 3/28, 3/30 and 4/24. She has a hx of CHTN, Pulmonary embolism- on Lovenox, and morbid obesity. She has an appointment scheduled in the office on 5/16 for missed f/u appointment in March.    Past Medical History:  Diagnosis Date  . Anemia   . History of DVT of lower extremity   . MRSA infection   . Pulmonary embolism ([place])   . S/P IVC filter   . Thrombocytosis ([place])    Past Surgical History:  Procedure Laterality Date  . NO PAST SURGERIES    . WISDOM TOOTH EXTRACTION     Social History   Socioeconomic History  . Marital status: Single    Spouse name: Not on file  . Number of children: Not on file  . Years of education: Not on file  . Highest education level: Not on file  Occupational History  . Not on file  Social Needs  . Financial resource strain: Not on file  . Food insecurity:    Worry: Not on file    Inability: Not on file  . Transportation needs:    Medical: Not on file    Non-medical: Not on file  Tobacco Use  . Smoking status: Never Smoker  . Smokeless tobacco: Never Used  . Tobacco comment: never used tobacco   Substance and Sexual Activity  . Alcohol use: No    Alcohol/week: 0.0 oz  . Drug use: No  . Sexual activity: Not Currently    Birth control/protection: None    Comment: has used mirena  Lifestyle  . Physical activity:    Days per week: Not on file    Minutes per session: Not on file  . Stress: Not on file  Relationships  . Social connections:    Talks on phone: Not on file    Gets together: Not on file    Attends religious service: Not on file    Active member of club or organization: Not on file    Attends meetings of clubs or organizations: Not on file    Relationship status: Not on file  . Intimate partner violence:    Fear of current or ex partner: Not on file    Emotionally abused: Not on file    Physically abused: Not on file    Forced sexual activity: Not on file  Other Topics Concern  . Not on file  Social History Narrative   ** Merged History Encounter **       No current facility-administered medications on file prior to encounter.    Current Outpatient Medications on File Prior to Encounter  Medication Sig Dispense Refill  . enoxaparin (LOVENOX) 150 MG/ML injection Inject  0.93 mLs (140 mg total) into the skin every 12 (twelve) hours. 60 Syringe 3  . Prenatal Vit-Fe Fumarate-FA (PRENATAL MULTIVITAMIN) TABS tablet Take 1 tablet by mouth daily at 12 noon.    Marland Kitchen ACCU-CHEK FASTCLIX LANCETS MISC 1 Device by Percutaneous route 4 (four) times daily. 100 each 12  . Blood Glucose Monitoring Suppl (ACCU-CHEK GUIDE) w/Device KIT 1 Device by Does not apply route 4 (four) times daily. 1 kit 0  . glucose blood (ACCU-CHEK GUIDE) test strip Use as instructed QID 100 each 12  . misoprostol (CYTOTEC) 200 MCG tablet Place 4 tablets (800 mcg total) vaginally once for 1 dose. Repeat in 24 hours if no results 8 tablet 1  . nitrofurantoin, macrocrystal-monohydrate, (MACROBID) 100 MG capsule Take 1 capsule (100 mg total) by mouth 2 (two) times daily. Take the 2nd dose qhs so it sits in  your bladder overnight. 14 capsule 0   No Known Allergies  ROS:  Review of Systems  Constitutional: Negative.   Respiratory: Negative.   Cardiovascular: Negative.   Gastrointestinal: Positive for abdominal pain. Negative for constipation, diarrhea, nausea and vomiting.  Genitourinary: Positive for vaginal bleeding. Negative for difficulty urinating, dysuria, frequency, pelvic pain, urgency, vaginal discharge and vaginal pain.  Musculoskeletal: Negative.   Neurological: Negative.    I have reviewed patient's Past Medical Hx, Surgical Hx, Family Hx, Social Hx, medications and allergies.   Physical Exam   Patient Vitals for the past 24 hrs:  BP Temp Temp src Pulse Resp Height Weight  05/26/17 0301 (!) 143/96 97.7 F (36.5 C) Oral (!) 101 20 - -  05/26/17 0056 (!) 150/93 97.8 F (36.6 C) Oral 93 19 _0  (1.575 m) (!) 316 lb 1.9 oz (143.4 kg)   Constitutional: Well-developed, morbid obese female in no acute distress.  Cardiovascular: normal rate Respiratory: normal effort GI: Abd soft, non-tender. Pos BS x 4 MS: Extremities nontender, no edema, normal ROM Neurologic: Alert and oriented x 4.  GU: Neg CVAT.  PELVIC EXAM: Cervix pink, visually open, without lesion, small bright red vaginal bleeding present on walls around cervix, no active bleeding from cervical os Bimanual exam: Cervix 1cm/long/high, firm, anterior, neg CMT, uterus nontender, nonenlarged, adnexa without tenderness, enlargement, or mass  LAB RESULTS Results for orders placed or performed during the hospital encounter of 05/26/17 (from the past 24 hour(s))  Urinalysis, Routine w reflex microscopic     Status: Abnormal   Collection Time: 05/26/17 12:58 AM  Result Value Ref Range   Color, Urine YELLOW YELLOW   APPearance HAZY (A) CLEAR   Specific Gravity, Urine 1.019 1.005 - 1.030   pH 6.0 5.0 - 8.0   Glucose, UA NEGATIVE NEGATIVE mg/dL   Hgb urine dipstick LARGE (A) NEGATIVE   Bilirubin Urine NEGATIVE NEGATIVE    Ketones, ur NEGATIVE NEGATIVE mg/dL   Protein, ur NEGATIVE NEGATIVE mg/dL   Nitrite NEGATIVE NEGATIVE   Leukocytes, UA TRACE (A) NEGATIVE   RBC / HPF >50 (H) 0 - 5 RBC/hpf   WBC, UA 21-50 0 - 5 WBC/hpf   Bacteria, UA RARE (A) NONE SEEN   Squamous Epithelial / LPF 0-5 0 - 5   Mucus PRESENT   Pregnancy, urine POC     Status: Abnormal   Collection Time: 05/26/17  1:07 AM  Result Value Ref Range   Preg Test, Ur POSITIVE (A) NEGATIVE  CBC     Status: Abnormal   Collection Time: 05/26/17  1:35 AM  Result Value Ref Range  WBC 11.2 (H) 4.0 - 10.5 K/uL   RBC 4.64 3.87 - 5.11 MIL/uL   Hemoglobin 11.4 (L) 12.0 - 15.0 g/dL   HCT 35.9 (L) 36.0 - 46.0 %   MCV 77.4 (L) 78.0 - 100.0 fL   MCH 24.6 (L) 26.0 - 34.0 pg   MCHC 31.8 30.0 - 36.0 g/dL   RDW 14.8 11.5 - 15.5 %   Platelets 299 150 - 400 K/uL  hCG, quantitative, pregnancy     Status: Abnormal   Collection Time: 05/26/17  1:35 AM  Result Value Ref Range   hCG, Beta Chain, Quant, S 39 (H) <5 mIU/mL  Wet prep, genital     Status: Abnormal   Collection Time: 05/26/17  1:42 AM  Result Value Ref Range   Yeast Wet Prep HPF POC NONE SEEN NONE SEEN   Trich, Wet Prep NONE SEEN NONE SEEN   Clue Cells Wet Prep HPF POC PRESENT (A) NONE SEEN   WBC, Wet Prep HPF POC MODERATE (A) NONE SEEN   Sperm NONE SEEN     A/Positive/-- (02/20 1405)  IMAGING US Ob Transvaginal  Result Date: 05/26/2017 CLINICAL DATA:  Cramping and spotting. Quantitative beta HCG is pending. LMP is not provided. EXAM: TRANSVAGINAL OB ULTRASOUND TECHNIQUE: Transvaginal ultrasound was performed for complete evaluation of the gestation as well as the maternal uterus, adnexal regions, and pelvic cul-de-sac. COMPARISON:  04/10/2017 FINDINGS: Intrauterine gestational sac: A single intrauterine gestational sac is identified. The sac has a somewhat irregular shape. Yolk sac:  Yolk sac is not visualized. Embryo:  Fetal pole is identified. Cardiac Activity: No fetal cardiac activity.  CRL: 12 mm   7 w 3 d. No interval growth since previous study. Subchorionic hemorrhage:  None visualized. Maternal uterus/adnexae: No myometrial mass lesions demonstrated in the uterus. Small nabothian cysts in the cervix. Both ovaries are visualized and appear normal. No abnormal adnexal masses. No abnormal fluid collections in the pelvis. IMPRESSION: A single intrauterine pregnancy is present. Fetal pole is identified with crown-rump length measuring to estimated gestational age of [redacted] weeks 3 days. This represents no interval growth since previous study. No fetal cardiac activity is identified. Findings meet definitive criteria for failed pregnancy. This follows SRU consensus guidelines: Diagnostic Criteria for Nonviable Pregnancy Early in the First Trimester. Alison Stalling J Med [phone]. Electronically Signed   By: Lucienne Capers M.D.   On: 05/26/2017 02:35    MAU Management/MDM: Orders Placed This Encounter  Procedures  . Wet prep, genital  . US OB Transvaginal  . Urinalysis, Routine w reflex microscopic  . CBC  . hCG, quantitative, pregnancy  . Pregnancy, urine POC   Wet prep- positive for clue cells  GC/C- pending  HCG- 39  CBC- hgb stable at 11.4, WNL  Urine culture- pending   Discussed with patient the results of lab and [name]. US showed same 33w3dembryo with no FHT. Discussed that this is in fact the same pregnancy and embryo was never passed with Cytotec medication that was taken. Educated that her body may be trying to pass the fetus on its on with her cervix now being open and vaginal bleeding reoccurring. C/w Dr DRosana Hoeswith options.   Consult Dr DRosana Hoeswith assessment, lab results and [place] Recommends discussing with the patient option of second Cytotec course or D&C. Reviewed process of both options and precautions with patient and gave patient time to make decision. [facility] DR[facility]ana Hoesnotified so that it can be scheduled for later  this week.   Discussed with patient  the need and importance of taking her Lovenox injections for her PE until [facility] and is scheduled and someone tells her to stop taking the medication. Patient verbalizes understanding. Patient discharge in stable condition. Discussed reasons to return to MAU before her procedure. Pt discharged with strict bleeding precautions.   ASSESSMENT 1. Missed abortion   2. Vaginal bleeding during pregnancy   3. Abdominal cramping   4. Pulmonary embolism affecting pregnancy, antepartum   5. Morbid obesity ([place])     PLAN Discharge home Will receive phone call Monday from scheduling to set up [facility] Return to MAU as needed for emergencies and/or increased vaginal bleeding  Continue taking Lovenox until told otherwise  Follow up as scheduled    Allergies as of 05/26/2017   No Known Allergies     Medication List    STOP taking these medications   misoprostol 200 MCG tablet Commonly known as:  CYTOTEC   nitrofurantoin (macrocrystal-monohydrate) 100 MG capsule Commonly known as:  MACROBID   prenatal multivitamin Tabs tablet     TAKE these medications   ACCU-CHEK FASTCLIX LANCETS Misc 1 Device by Percutaneous route 4 (four) times daily.   ACCU-CHEK GUIDE w/Device Kit 1 Device by Does not apply route 4 (four) times daily.   enoxaparin 150 MG/ML injection Commonly known as:  LOVENOX Inject 0.93 mLs (140 mg total) into the skin every 12 (twelve) hours.   glucose blood test strip Commonly known as:  ACCU-CHEK GUIDE Use as instructed QID       Darrol Poke  Certified Nurse-Midwife 05/26/2017  3:26 AM

## 2017-05-27 ENCOUNTER — Telehealth (HOSPITAL_COMMUNITY): Payer: Self-pay

## 2017-05-27 LAB — CULTURE, OB URINE

## 2017-05-27 LAB — GC/CHLAMYDIA PROBE AMP (~~LOC~~) NOT AT ARMC
Chlamydia: NEGATIVE
Neisseria Gonorrhea: NEGATIVE

## 2017-05-27 NOTE — Telephone Encounter (Signed)
Called patient, no answer, left voicemail instructing patient to return my phone call.

## 2017-05-27 NOTE — Telephone Encounter (Signed)
-----   Message from Sloan Leiter, MD sent at 05/26/2017  2:59 AM EDT ----- Regarding: needs D&C This lady was in MAU tonight with positive UPT, looks like same pregnancy that was a missed abortion 03/2017 for which she took cytotec, had some bleeding but did not follow up to confirm abortion was completed. She was offered cytotec again but opts for D&C. History complicated by multiple h/o DVT/PE for which she is supposed to be on anticoagulation but takes it intermittently due to cost. She will need to be off lovenox prior to [facility].

## 2017-05-28 ENCOUNTER — Encounter (HOSPITAL_COMMUNITY): Payer: Self-pay | Admitting: *Deleted

## 2017-05-28 ENCOUNTER — Other Ambulatory Visit: Payer: Self-pay

## 2017-05-29 ENCOUNTER — Ambulatory Visit (HOSPITAL_COMMUNITY): Payer: BLUE CROSS/BLUE SHIELD | Admitting: Certified Registered Nurse Anesthetist

## 2017-05-29 ENCOUNTER — Encounter (HOSPITAL_COMMUNITY): Payer: Self-pay | Admitting: *Deleted

## 2017-05-29 ENCOUNTER — Other Ambulatory Visit: Payer: Self-pay

## 2017-05-29 ENCOUNTER — Ambulatory Visit (HOSPITAL_COMMUNITY)
Admission: RE | Admit: 2017-05-29 | Discharge: 2017-05-29 | Disposition: A | Discharge status: Home / Self Care | Payer: BLUE CROSS/BLUE SHIELD | Source: Ambulatory Visit | Attending: Obstetrics & Gynecology | Admitting: Obstetrics & Gynecology

## 2017-05-29 ENCOUNTER — Encounter (HOSPITAL_COMMUNITY)
Admission: RE | Disposition: A | Discharge status: Home / Self Care | Payer: Self-pay | Source: Ambulatory Visit | Attending: Obstetrics & Gynecology

## 2017-05-29 DIAGNOSIS — O161 Unspecified maternal hypertension, first trimester: Secondary | ICD-10-CM | POA: Insufficient documentation

## 2017-05-29 DIAGNOSIS — I2699 Other pulmonary embolism without acute cor pulmonale: Secondary | ICD-10-CM | POA: Diagnosis present

## 2017-05-29 DIAGNOSIS — Z86711 Personal history of pulmonary embolism: Secondary | ICD-10-CM

## 2017-05-29 DIAGNOSIS — Z79899 Other long term (current) drug therapy: Secondary | ICD-10-CM | POA: Insufficient documentation

## 2017-05-29 DIAGNOSIS — K219 Gastro-esophageal reflux disease without esophagitis: Secondary | ICD-10-CM | POA: Diagnosis not present

## 2017-05-29 DIAGNOSIS — O26891 Other specified pregnancy related conditions, first trimester: Secondary | ICD-10-CM | POA: Diagnosis not present

## 2017-05-29 DIAGNOSIS — E119 Type 2 diabetes mellitus without complications: Secondary | ICD-10-CM

## 2017-05-29 DIAGNOSIS — O99211 Obesity complicating pregnancy, first trimester: Secondary | ICD-10-CM | POA: Insufficient documentation

## 2017-05-29 DIAGNOSIS — I10 Essential (primary) hypertension: Secondary | ICD-10-CM | POA: Diagnosis present

## 2017-05-29 DIAGNOSIS — O24911 Unspecified diabetes mellitus in pregnancy, first trimester: Secondary | ICD-10-CM | POA: Diagnosis not present

## 2017-05-29 DIAGNOSIS — O021 Missed abortion: Secondary | ICD-10-CM | POA: Diagnosis not present

## 2017-05-29 DIAGNOSIS — Z3A01 Less than 8 weeks gestation of pregnancy: Secondary | ICD-10-CM | POA: Insufficient documentation

## 2017-05-29 DIAGNOSIS — Z6841 Body Mass Index (BMI) 40.0 and over, adult: Secondary | ICD-10-CM

## 2017-05-29 DIAGNOSIS — D509 Iron deficiency anemia, unspecified: Secondary | ICD-10-CM | POA: Diagnosis present

## 2017-05-29 HISTORY — DX: Gastro-esophageal reflux disease without esophagitis: K21.9

## 2017-05-29 HISTORY — DX: Personal history of other diseases of the digestive system: Z87.19

## 2017-05-29 HISTORY — DX: Headache: R51

## 2017-05-29 HISTORY — DX: Cystitis, unspecified without hematuria: N30.90

## 2017-05-29 HISTORY — DX: Headache, unspecified: R51.9

## 2017-05-29 HISTORY — DX: Personal history of other medical treatment: Z92.89

## 2017-05-29 HISTORY — PX: DILATION AND EVACUATION: SHX1459

## 2017-05-29 HISTORY — DX: Type 2 diabetes mellitus without complications: E11.9

## 2017-05-29 SURGERY — DILATION AND EVACUATION, UTERUS
Anesthesia: General | Site: Vagina

## 2017-05-29 MED ORDER — PROPOFOL 10 MG/ML IV BOLUS
INTRAVENOUS | Status: AC
  Filled 2017-05-29: qty 20

## 2017-05-29 MED ORDER — ENOXAPARIN SODIUM 150 MG/ML ~~LOC~~ SOLN
140.0000 mg | Freq: Once | SUBCUTANEOUS | Status: AC
  Administered 2017-05-29: 140 mg via SUBCUTANEOUS
  Filled 2017-05-29: qty 0.93

## 2017-05-29 MED ORDER — LACTATED RINGERS IV SOLN
INTRAVENOUS | Status: DC
  Administered 2017-05-29 (×2): via INTRAVENOUS

## 2017-05-29 MED ORDER — ONDANSETRON HCL 4 MG/2ML IJ SOLN
INTRAMUSCULAR | Status: DC | PRN
  Administered 2017-05-29: 4 mg via INTRAVENOUS

## 2017-05-29 MED ORDER — KETOROLAC TROMETHAMINE 30 MG/ML IJ SOLN
INTRAMUSCULAR | Status: AC
  Filled 2017-05-29: qty 1

## 2017-05-29 MED ORDER — HYDROMORPHONE HCL 1 MG/ML IJ SOLN: 0.2500 mg | INTRAMUSCULAR | Status: DC | PRN

## 2017-05-29 MED ORDER — METHYLERGONOVINE MALEATE 0.2 MG/ML IJ SOLN
INTRAMUSCULAR | Status: DC | PRN
  Administered 2017-05-29: 0.2 mg via INTRAMUSCULAR

## 2017-05-29 MED ORDER — OXYCODONE HCL 5 MG/5ML PO SOLN: 5.0000 mg | Freq: Once | ORAL | Status: DC | PRN

## 2017-05-29 MED ORDER — DEXAMETHASONE SODIUM PHOSPHATE 4 MG/ML IJ SOLN
INTRAMUSCULAR | Status: DC | PRN
  Administered 2017-05-29: 4 mg via INTRAVENOUS

## 2017-05-29 MED ORDER — MEPERIDINE HCL 25 MG/ML IJ SOLN: 6.2500 mg | INTRAMUSCULAR | Status: DC | PRN

## 2017-05-29 MED ORDER — SCOPOLAMINE 1 MG/3DAYS TD PT72
MEDICATED_PATCH | TRANSDERMAL | Status: AC
  Filled 2017-05-29: qty 1

## 2017-05-29 MED ORDER — FENTANYL CITRATE (PF) 100 MCG/2ML IJ SOLN
INTRAMUSCULAR | Status: AC
  Filled 2017-05-29: qty 2

## 2017-05-29 MED ORDER — LIDOCAINE HCL (CARDIAC) PF 100 MG/5ML IV SOSY
PREFILLED_SYRINGE | INTRAVENOUS | Status: DC | PRN
  Administered 2017-05-29: 60 mg via INTRAVENOUS

## 2017-05-29 MED ORDER — IBUPROFEN 600 MG PO TABS: 600.0000 mg | ORAL_TABLET | Freq: Four times a day (QID) | ORAL | 0 refills | Status: DC | PRN

## 2017-05-29 MED ORDER — OXYCODONE-ACETAMINOPHEN 5-325 MG PO TABS: 1.0000 | ORAL_TABLET | Freq: Four times a day (QID) | ORAL | 0 refills | Status: DC | PRN

## 2017-05-29 MED ORDER — FENTANYL CITRATE (PF) 100 MCG/2ML IJ SOLN
INTRAMUSCULAR | Status: DC | PRN
  Administered 2017-05-29 (×2): 25 ug via INTRAVENOUS

## 2017-05-29 MED ORDER — PROMETHAZINE HCL 25 MG/ML IJ SOLN: 6.2500 mg | INTRAMUSCULAR | Status: DC | PRN

## 2017-05-29 MED ORDER — KETOROLAC TROMETHAMINE 30 MG/ML IJ SOLN
INTRAMUSCULAR | Status: DC | PRN
  Administered 2017-05-29: 30 mg via INTRAVENOUS

## 2017-05-29 MED ORDER — SCOPOLAMINE 1 MG/3DAYS TD PT72
1.0000 | MEDICATED_PATCH | Freq: Once | TRANSDERMAL | Status: DC
  Administered 2017-05-29: 1.5 mg via TRANSDERMAL

## 2017-05-29 MED ORDER — DEXAMETHASONE SODIUM PHOSPHATE 4 MG/ML IJ SOLN
INTRAMUSCULAR | Status: AC
  Filled 2017-05-29: qty 1

## 2017-05-29 MED ORDER — BUPIVACAINE HCL (PF) 0.5 % IJ SOLN
INTRAMUSCULAR | Status: DC | PRN
  Administered 2017-05-29: 20 mL

## 2017-05-29 MED ORDER — DOXYCYCLINE HYCLATE 100 MG IV SOLR
200.0000 mg | INTRAVENOUS | Status: AC
  Administered 2017-05-29: 200 mg via INTRAVENOUS
  Filled 2017-05-29: qty 200

## 2017-05-29 MED ORDER — BUPIVACAINE HCL (PF) 0.5 % IJ SOLN
INTRAMUSCULAR | Status: AC
  Filled 2017-05-29: qty 30

## 2017-05-29 MED ORDER — PROPOFOL 10 MG/ML IV BOLUS
INTRAVENOUS | Status: DC | PRN
  Administered 2017-05-29: 200 mg via INTRAVENOUS

## 2017-05-29 MED ORDER — LACTATED RINGERS IV SOLN: INTRAVENOUS | Status: DC

## 2017-05-29 MED ORDER — OXYCODONE HCL 5 MG PO TABS: 5.0000 mg | ORAL_TABLET | Freq: Once | ORAL | Status: DC | PRN

## 2017-05-29 MED ORDER — MIDAZOLAM HCL 2 MG/2ML IJ SOLN
INTRAMUSCULAR | Status: AC
  Filled 2017-05-29: qty 2

## 2017-05-29 MED ORDER — ONDANSETRON HCL 4 MG/2ML IJ SOLN
INTRAMUSCULAR | Status: AC
  Filled 2017-05-29: qty 2

## 2017-05-29 MED ORDER — MIDAZOLAM HCL 2 MG/2ML IJ SOLN
INTRAMUSCULAR | Status: DC | PRN
  Administered 2017-05-29: 2 mg via INTRAVENOUS

## 2017-05-29 SURGICAL SUPPLY — 19 items
CATH ROBINSON RED A/P 16FR (CATHETERS) ×2 IMPLANT
DECANTER SPIKE VIAL GLASS SM (MISCELLANEOUS) ×2 IMPLANT
GLOVE BIOGEL PI IND STRL 7.0 (GLOVE) ×2 IMPLANT
GLOVE BIOGEL PI INDICATOR 7.0 (GLOVE) ×2
GLOVE ECLIPSE 7.0 STRL STRAW (GLOVE) ×2 IMPLANT
GOWN STRL REUS W/ TWL XL LVL3 (GOWN DISPOSABLE) ×1 IMPLANT
GOWN STRL REUS W/TWL LRG LVL3 (GOWN DISPOSABLE) ×2 IMPLANT
GOWN STRL REUS W/TWL XL LVL3 (GOWN DISPOSABLE) ×1
KIT BERKELEY 1ST TRIMESTER 3/8 (MISCELLANEOUS) ×2 IMPLANT
NS IRRIG 1000ML POUR BTL (IV SOLUTION) ×2 IMPLANT
PACK VAGINAL MINOR WOMEN LF (CUSTOM PROCEDURE TRAY) ×2 IMPLANT
PAD OB MATERNITY 4.3X12.25 (PERSONAL CARE ITEMS) ×2 IMPLANT
PAD PREP 24X48 CUFFED NSTRL (MISCELLANEOUS) ×2 IMPLANT
SET BERKELEY SUCTION TUBING (SUCTIONS) ×2 IMPLANT
TOWEL OR 17X24 6PK STRL BLUE (TOWEL DISPOSABLE) ×4 IMPLANT
VACURETTE 10 RIGID CVD (CANNULA) ×2 IMPLANT
VACURETTE 7MM CVD STRL WRAP (CANNULA) IMPLANT
VACURETTE 8 RIGID CVD (CANNULA) IMPLANT
VACURETTE 9 RIGID CVD (CANNULA) IMPLANT

## 2017-05-29 NOTE — Anesthesia Postprocedure Evaluation (Signed)
Anesthesia Post Note  Patient: Virginia Fitzgerald  Procedure(s) Performed: DILATATION AND EVACUATION (N/A Vagina )     Patient location during evaluation: PACU Anesthesia Type: General Level of consciousness: awake and alert Pain management: pain level controlled Vital Signs Assessment: post-procedure vital signs reviewed and stable Respiratory status: spontaneous breathing, nonlabored ventilation and respiratory function stable Cardiovascular status: blood pressure returned to baseline and stable Postop Assessment: no apparent nausea or vomiting Anesthetic complications: no    Last Vitals:  Vitals:   05/29/17 1545 05/29/17 1615  BP: (!) 143/102 (!) 116/93  Pulse: 93 95  Resp: 16 17  Temp:    SpO2: 94% 94%    Last Pain:  Vitals:   05/29/17 1615  TempSrc:   PainSc: 0-No pain   Pain Goal: Patients Stated Pain Goal: 0 (05/29/17 1615)               Lynda Rainwater

## 2017-05-29 NOTE — Discharge Instructions (Signed)
DISCHARGE INSTRUCTIONS: D&C / D&E The following instructions have been prepared to help you care for yourself upon your return home.   Personal hygiene:  Use sanitary pads for vaginal drainage, not tampons.  Shower the day after your procedure.  NO tub baths, pools or Jacuzzis for 2-3 weeks.  Wipe front to back after using the bathroom.  Activity and limitations:  Do NOT drive or operate any equipment for 24 hours. The effects of anesthesia are still present and drowsiness may result.  Do NOT rest in bed all day.  Walking is encouraged.  Walk up and down stairs slowly.  You may resume your normal activity in one to two days or as indicated by your physician.  Sexual activity: NO intercourse for at least 2 weeks after the procedure, or as indicated by your physician.  Diet: Eat a light meal as desired this evening. You may resume your usual diet tomorrow.  Return to work: You may resume your work activities in one to two days or as indicated by your doctor.  What to expect after your surgery: Expect to have vaginal bleeding/discharge for 2-3 days and spotting for up to 10 days. It is not unusual to have soreness for up to 1-2 weeks. You may have a slight burning sensation when you urinate for the first day. Mild cramps may continue for a couple of days. You may have a regular period in 2-6 weeks.  Call your doctor for any of the following:  Excessive vaginal bleeding, saturating and changing one pad every hour.  Inability to urinate 6 hours after discharge from hospital.  Pain not relieved by pain medication.  Fever of 100.4 F or greater.  Unusual vaginal discharge or odor.    Post Anesthesia Home Care Instructions  NO IBUPROFEN PRODUCTS UNTIL 8:15 PM TONIGHT  Activity: Get plenty of rest for the remainder of the day. A responsible individual must stay with you for 24 hours following the procedure.  For the next 24 hours, DO NOT: -Drive a car -Conservation officer, nature -Drink alcoholic beverages -Take any medication unless instructed by your physician -Make any legal decisions or sign important papers.  Meals: Start with liquid foods such as gelatin or soup. Progress to regular foods as tolerated. Avoid greasy, spicy, heavy foods. If nausea and/or vomiting occur, drink only clear liquids until the nausea and/or vomiting subsides. Call your physician if vomiting continues.  Special Instructions/Symptoms: Your throat may feel dry or sore from the anesthesia or the breathing tube placed in your throat during surgery. If this causes discomfort, gargle with warm salt water. The discomfort should disappear within 24 hours.  If you had a scopolamine patch placed behind your ear for the management of post- operative nausea and/or vomiting:  1. The medication in the patch is effective for 72 hours, after which it should be removed.  Wrap patch in a tissue and discard in the trash. Wash hands thoroughly with soap and water. 2. You may remove the patch earlier than 72 hours if you experience unpleasant side effects which may include dry mouth, dizziness or visual disturbances. 3. Avoid touching the patch. Wash your hands with soap and water after contact with the patch.

## 2017-05-29 NOTE — H&P (Signed)
Preoperative History and Physical  Virginia Fitzgerald is a 32 y.o. [id number] here for surgical management of missed abortion  Proposed surgery: dilation and evacuation for missed abortion  Past Medical History:  Diagnosis Date  . Anemia   . Bladder infection 03/2017  . Diabetes mellitus without complication ([place])    WITH 2019 PREGNANCY ONLY  . GERD (gastroesophageal reflux disease)    diet controlled - no meds  . Headache    last one 05/25/17 - otc med  . History of blood transfusion 2012   At [facility]  . History of DVT of lower extremity   . History of hiatal hernia   . MRSA infection    not in EPIC - per patient in 2011  . Pulmonary embolism ([place])   . S/P IVC filter   . SVD (spontaneous vaginal delivery)    x 3  . Thrombocytosis ([place])    Past Surgical History:  Procedure Laterality Date  . WISDOM TOOTH EXTRACTION     OB History    Gravida  5   Para  3   Term  3   Preterm  0   AB  1   Living  3     SAB  1   TAB  0   Ectopic  0   Multiple      Live Births  3        Obstetric Comments  Largest prior was 7lbs 7oz. No issues with deliveries.        Patient denies any cervical dysplasia or STIs. Medications Prior to Admission  Medication Sig Dispense Refill Last Dose  . acetaminophen (TYLENOL) 500 MG tablet Take 1,000 mg by mouth every 6 (six) hours as needed for moderate pain or headache.   Past Week at Unknown time  . ACCU-CHEK FASTCLIX LANCETS MISC 1 Device by Percutaneous route 4 (four) times daily. (Patient not taking: Reported on 05/27/2017) 100 each 12 Not Taking at Unknown time  . Blood Glucose Monitoring Suppl (ACCU-CHEK GUIDE) w/Device KIT 1 Device by Does not apply route 4 (four) times daily. (Patient not taking: Reported on 05/27/2017) 1 kit 0 Not Taking at Unknown time  . enoxaparin (LOVENOX) 150 MG/ML injection Inject 0.93 mLs (140 mg total) into the skin every 12 (twelve) hours. 60 Syringe 3 More than a month at Unknown time  . glucose blood  (ACCU-CHEK GUIDE) test strip Use as instructed QID (Patient not taking: Reported on 05/27/2017) 100 each 12 Not Taking at Unknown time    No Known Allergies Social History:   reports that she has never smoked. She has never used smokeless tobacco. She reports that she does not drink alcohol or use drugs. Family History  Problem Relation Age of Onset  . Diabetes Mother   . Hypertension Mother   . Hypertension Maternal Uncle   . Diabetes Maternal Uncle   . Hypertension Maternal Grandmother     Review of Systems: Noncontributory  PHYSICAL EXAM: Blood pressure (!) 153/93, pulse 93, temperature 98.2 F (36.8 C), temperature source Oral, resp. rate 16, height 5' 3" (1.6 m), weight (!) 315 lb (142.9 kg), SpO2 98 %, unknown if currently breastfeeding. General appearance - alert, well appearing, and in no distress Chest - clear to auscultation, no wheezes, rales or rhonchi, symmetric air entry Heart - normal rate and regular rhythm Abdomen - soft, nontender, nondistended, no masses or organomegaly Pelvic - examination not indicated Extremities - peripheral pulses normal, no pedal edema, no clubbing or cyanosis  Labs: Results for orders placed or performed during the hospital encounter of 05/26/17 (from the past 336 hour(s))  GC/Chlamydia probe amp ([place])not at [facility]   Collection Time: 05/26/17 12:00 AM  Result Value Ref Range   Chlamydia Negative    Neisseria gonorrhea Negative   Culture, OB Urine   Collection Time: 05/26/17 12:58 AM  Result Value Ref Range   Specimen Description      OB CLEAN CATCH Performed at [facility], [address], [place], [place] [address]    Special Requests      NONE Performed at [facility], [place], [place] [address]    Culture (A)     MULTIPLE SPECIES PRESENT, SUGGEST RECOLLECTION NO GROUP B STREP (S.AGALACTIAE) ISOLATED Performed at [facility], [place] [address], [place], [place] [address]    Report Status  05/27/2017 FINAL   Urinalysis, Routine w reflex microscopic   Collection Time: 05/26/17 12:58 AM  Result Value Ref Range   Color, Urine YELLOW YELLOW   APPearance HAZY (A) CLEAR   Specific Gravity, Urine 1.019 1.005 - 1.030   pH 6.0 5.0 - 8.0   Glucose, UA NEGATIVE NEGATIVE mg/dL   Hgb urine dipstick LARGE (A) NEGATIVE   Bilirubin Urine NEGATIVE NEGATIVE   Ketones, ur NEGATIVE NEGATIVE mg/dL   Protein, ur NEGATIVE NEGATIVE mg/dL   Nitrite NEGATIVE NEGATIVE   Leukocytes, UA TRACE (A) NEGATIVE   RBC / HPF >50 (H) 0 - 5 RBC/hpf   WBC, UA 21-50 0 - 5 WBC/hpf   Bacteria, UA RARE (A) NONE SEEN   Squamous Epithelial / LPF 0-5 0 - 5   Mucus PRESENT   Pregnancy, urine POC   Collection Time: 05/26/17  1:07 AM  Result Value Ref Range   Preg Test, Ur POSITIVE (A) NEGATIVE  CBC   Collection Time: 05/26/17  1:35 AM  Result Value Ref Range   WBC 11.2 (H) 4.0 - 10.5 K/uL   RBC 4.64 3.87 - 5.11 MIL/uL   Hemoglobin 11.4 (L) 12.0 - 15.0 g/dL   HCT 35.9 (L) 36.0 - 46.0 %   MCV 77.4 (L) 78.0 - 100.0 fL   MCH 24.6 (L) 26.0 - 34.0 pg   MCHC 31.8 30.0 - 36.0 g/dL   RDW 14.8 11.5 - 15.5 %   Platelets 299 150 - 400 K/uL  hCG, quantitative, pregnancy   Collection Time: 05/26/17  1:35 AM  Result Value Ref Range   hCG, Beta Chain, Quant, S 39 (H) <5 mIU/mL  Wet prep, genital   Collection Time: 05/26/17  1:42 AM  Result Value Ref Range   Yeast Wet Prep HPF POC NONE SEEN NONE SEEN   Trich, Wet Prep NONE SEEN NONE SEEN   Clue Cells Wet Prep HPF POC PRESENT (A) NONE SEEN   WBC, Wet Prep HPF POC MODERATE (A) NONE SEEN   Sperm NONE SEEN     Imaging Studies: US Ob Transvaginal  Result Date: 05/26/2017 CLINICAL DATA:  Cramping and spotting. Quantitative beta HCG is pending. LMP is not provided. EXAM: TRANSVAGINAL OB ULTRASOUND TECHNIQUE: Transvaginal ultrasound was performed for complete evaluation of the gestation as well as the maternal uterus, adnexal regions, and pelvic cul-de-sac. COMPARISON:   04/10/2017 FINDINGS: Intrauterine gestational sac: A single intrauterine gestational sac is identified. The sac has a somewhat irregular shape. Yolk sac:  Yolk sac is not visualized. Embryo:  Fetal pole is identified. Cardiac Activity: No fetal cardiac activity. CRL: 12 mm   7  w 3 d. No interval growth since previous study. Subchorionic hemorrhage:  None visualized. Maternal uterus/adnexae: No myometrial mass lesions demonstrated in the uterus. Small nabothian cysts in the cervix. Both ovaries are visualized and appear normal. No abnormal adnexal masses. No abnormal fluid collections in the pelvis. IMPRESSION: A single intrauterine pregnancy is present. Fetal pole is identified with crown-rump length measuring to estimated gestational age of [redacted] weeks 3 days. This represents no interval growth since previous study. No fetal cardiac activity is identified. Findings meet definitive criteria for failed pregnancy. This follows SRU consensus guidelines: Diagnostic Criteria for Nonviable Pregnancy Early in the First Trimester. Alison Stalling J Med [phone]. Electronically Signed   By: Lucienne Capers M.D.   On: 05/26/2017 02:35    Assessment: Patient Active Problem List   Diagnosis Date Noted  . Missed abortion 05/29/2017  . Dysplasia of cervix, low grade (CIN 1) 03/28/2017  . 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 Essential hypertension, benign 03/20/2017  . 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 BMI 50.0-59.9, adult ([place]) 02/28/2017  . Iron deficiency anemia 11/14/2011    Plan: Patient will undergo surgical management with dilation and evacuation for missed abortion.The risks of surgery were discussed in detail with the patient including but not limited to: bleeding which may require transfusion or reoperation; infection which may require antibiotics; injury to surrounding organs which may involve bowel, bladder, ureters ; need for additional procedures including laparoscopy  or laparotomy; thromboembolic phenomenon, surgical site problems and other postoperative/anesthesia complications. Likelihood of success in alleviating the patient's condition was discussed. Routine postoperative instructions will be reviewed with the patient and her family in detail after surgery.  The patient concurred with the proposed plan, giving informed written consent for the surgery.  Patient has been NPO since last night she will remain NPO for procedure.  Anesthesia and OR aware.  Preoperative prophylactic antibiotics and SCDs ordered on call to the OR.  To OR when ready.  Emilly Lavey L. Harraway-Smith, M.D., [place] 05/29/2017 1:50 PM

## 2017-05-29 NOTE — Progress Notes (Signed)
Patient states MRSA 2011 but no results in Epic of positive MRSA

## 2017-05-29 NOTE — Anesthesia Preprocedure Evaluation (Signed)
Anesthesia Evaluation  Patient identified by MRN, date of birth, ID band Patient awake    Reviewed: Allergy & Precautions, NPO status , Patient's Chart, lab work & pertinent test results  Airway Mallampati: II  TM Distance: >3 FB Neck ROM: Full    Dental no notable dental hx.    Pulmonary neg pulmonary ROS,    Pulmonary exam normal breath sounds clear to auscultation       Cardiovascular hypertension, negative cardio ROS Normal cardiovascular exam Rhythm:Regular Rate:Normal     Neuro/Psych  Headaches, negative neurological ROS  negative psych ROS   GI/Hepatic negative GI ROS, Neg liver ROS, GERD  ,  Endo/Other  negative endocrine ROSdiabetesMorbid obesity  Renal/GU negative Renal ROS  negative genitourinary   Musculoskeletal negative musculoskeletal ROS (+)   Abdominal   Peds negative pediatric ROS (+)  Hematology negative hematology ROS (+)   Anesthesia Other Findings   Reproductive/Obstetrics negative OB ROS                             Anesthesia Physical Anesthesia Plan  ASA: III  Anesthesia Plan: General   Post-op Pain Management:    Induction: Intravenous  PONV Risk Score and Plan: 3 and Ondansetron, Dexamethasone and Midazolam  Airway Management Planned: LMA and Oral ETT  Additional Equipment:   Intra-op Plan:   Post-operative Plan: Extubation in OR  Informed Consent: I have reviewed the patients History and Physical, chart, labs and discussed the procedure including the risks, benefits and alternatives for the proposed anesthesia with the patient or authorized representative who has indicated his/her understanding and acceptance.   Dental advisory given  Plan Discussed with: CRNA  Anesthesia Plan Comments:         Anesthesia Quick Evaluation

## 2017-05-29 NOTE — Transfer of Care (Signed)
Immediate Anesthesia Transfer of Care Note  Patient: Virginia Fitzgerald  Procedure(s) Performed: DILATATION AND EVACUATION (N/A Vagina )  Patient Location: PACU  Anesthesia Type:General  Level of Consciousness: awake, alert , oriented and patient cooperative  Airway & Oxygen Therapy: Patient Spontanous Breathing and Patient connected to face mask oxygen  Post-op Assessment: Report given to RN and Post -op Vital signs reviewed and stable  Post vital signs: Reviewed and stable  Last Vitals:  Vitals Value Taken Time  BP 138/106 05/29/2017  2:30 PM  Temp    Pulse 107 05/29/2017  2:31 PM  Resp 20 05/29/2017  2:31 PM  SpO2 98 % 05/29/2017  2:31 PM  Vitals shown include unvalidated device data.  Last Pain:  Vitals:   05/29/17 1243  TempSrc: Oral  PainSc: 0-No pain      Patients Stated Pain Goal: 3 (49/[id number])  Complications: No apparent anesthesia complications

## 2017-05-29 NOTE — Brief Op Note (Signed)
05/29/2017  3:13 PM  PATIENT:  Virginia Fitzgerald  32 y.o. female  PRE-OPERATIVE DIAGNOSIS:  Missed AB  POST-OPERATIVE DIAGNOSIS:  missed abortion  PROCEDURE:  Procedure(s): DILATATION AND EVACUATION (N/A)  SURGEON:  Surgeon(s) and Role:    * Lavonia Drafts, MD - Primary  ANESTHESIA:   general and paracervical block  EBL:  50 mL   BLOOD ADMINISTERED:none  DRAINS: none   LOCAL MEDICATIONS USED:  MARCAINE     SPECIMEN:  Source of Specimen:  products of conception  DISPOSITION OF SPECIMEN:  PATHOLOGY  COUNTS:  YES  TOURNIQUET:  * No tourniquets in log *  DICTATION: .Note written in EPIC  PLAN OF CARE: Discharge to home after PACU  PATIENT DISPOSITION:  PACU - hemodynamically stable.   Delay start of Pharmacological VTE agent (>24hrs) due to surgical blood loss or risk of bleeding: no. Pt to resume anticoagulants today  Complications: none immediate  Virginia Fitzgerald, M.D., [place]nn June

## 2017-05-29 NOTE — Op Note (Signed)
05/29/2017  3:13 PM  PATIENT:  Virginia Fitzgerald  32 y.o. female  PRE-OPERATIVE DIAGNOSIS:  Missed AB  POST-OPERATIVE DIAGNOSIS:  missed abortion  PROCEDURE:  Procedure(s): DILATATION AND EVACUATION (N/A)  SURGEON:  Surgeon(s) and Role:    * Lavonia Drafts, MD - Primary  ANESTHESIA:   general and paracervical block  EBL:  50 mL   BLOOD ADMINISTERED:none  DRAINS: none   LOCAL MEDICATIONS USED:  MARCAINE     SPECIMEN:  Source of Specimen:  products of conception  DISPOSITION OF SPECIMEN:  PATHOLOGY  COUNTS:  YES  TOURNIQUET:  * No tourniquets in log *  DICTATION: .Note written in EPIC  PLAN OF CARE: Discharge to home after PACU  PATIENT DISPOSITION:  PACU - hemodynamically stable.   Delay start of Pharmacological VTE agent (>24hrs) due to surgical blood loss or risk of bleeding: no. Pt to resume anticoagulants today  Complications: none immediate  INDICATIONS: 33 y.o. [id number] with MAB at [redacted] weeks gestation   Risks of surgery were discussed with the patient including but not limited to: bleeding which may require transfusion; infection which may require antibiotics; injury to uterus or surrounding organs; need for additional procedures including laparotomy or laparoscopy; possibility of intrauterine scarring which may impair future fertility; and other postoperative/anesthesia complications. Written informed consent was obtained.    FINDINGS:  A uterus with moderate amounts of products of conception, specimen sent to pathology.  PROCEDURE DETAILS:  The patient was taken to the operating room where monitored intravenous sedation was administered and was found to be adequate.  After an adequate timeout was performed, she was placed in the dorsal lithotomy position and examined; then prepped and draped in the sterile manner.   Her bladder was catheterized for an unmeasured amount of clear, yellow urine. A vaginal speculum was then placed in the patient's vagina and a  single tooth tenaculum was applied to the anterior lip of the cervix.  A paracervical block using 20 ml of 0.5% Marcaine was administered. The cervix was gently dilated to accommodate a 10 mm suction curette that was gently advanced to the uterine fundus.  The suction device was then activated to a suction of 50-13mmHg and curette slowly rotated to clear the uterus of products of conception.  A sharp curettage was then performed to confirm complete emptying of the uterus. There was minimal bleeding noted and the tenaculum removed with good hemostasis noted.   All instruments were removed from the patient's vagina. The patient tolerated the procedure well and was taken to the recovery area awake, and in stable condition.  The patient will be discharged to home as per PACU criteria.  Routine postoperative instructions given.  She was prescribed Percocet, Ibuprofen and Doxycycline.  She will follow up in the clinic in 2 weeks for postoperative evaluation.  Ilya Ess L. Harraway-Smith, M.D., [place]

## 2017-05-29 NOTE — Anesthesia Procedure Notes (Signed)
Procedure Name: LMA Insertion Date/Time: 05/29/2017 2:00 PM Performed by: Raenette Rover, CRNA Pre-anesthesia Checklist: Patient identified, Emergency Drugs available, Suction available and Patient being monitored Patient Re-evaluated:Patient Re-evaluated prior to induction Oxygen Delivery Method: Circle system utilized Preoxygenation: Pre-oxygenation with 100% oxygen Induction Type: IV induction LMA: LMA with gastric port inserted LMA Size: 4.0 Number of attempts: 1 Placement Confirmation: positive ETCO2,  CO2 detector and breath sounds checked- equal and bilateral Tube secured with: Tape Dental Injury: Teeth and Oropharynx as per pre-operative assessment

## 2017-05-30 ENCOUNTER — Encounter (HOSPITAL_COMMUNITY): Payer: Self-pay | Admitting: Obstetrics & Gynecology

## 2017-05-30 LAB — GLUCOSE, CAPILLARY: Glucose-Capillary: 106 mg/dL — ABNORMAL HIGH (ref 65–99)

## 2017-06-13 ENCOUNTER — Ambulatory Visit: Payer: BLUE CROSS/BLUE SHIELD | Admitting: Student

## 2017-06-20 ENCOUNTER — Ambulatory Visit: Payer: BLUE CROSS/BLUE SHIELD | Admitting: Student

## 2017-07-10 ENCOUNTER — Encounter: Payer: Self-pay | Admitting: Student

## 2017-07-11 ENCOUNTER — Ambulatory Visit: Payer: BLUE CROSS/BLUE SHIELD | Admitting: Student

## 2017-07-11 NOTE — Progress Notes (Signed)
Per provider, Jorje Guild, NP pt does not to be contacted in regards to missed appt.

## 2017-07-19 ENCOUNTER — Emergency Department (HOSPITAL_COMMUNITY)
Admission: EM | Admit: 2017-07-19 | Discharge: 2017-07-19 | Disposition: A | Discharge status: Home / Self Care | Payer: BLUE CROSS/BLUE SHIELD | Attending: Emergency Medicine | Admitting: Emergency Medicine

## 2017-07-19 ENCOUNTER — Emergency Department (HOSPITAL_COMMUNITY): Payer: BLUE CROSS/BLUE SHIELD

## 2017-07-19 ENCOUNTER — Encounter (HOSPITAL_COMMUNITY): Payer: Self-pay

## 2017-07-19 ENCOUNTER — Other Ambulatory Visit: Payer: Self-pay

## 2017-07-19 ENCOUNTER — Emergency Department (HOSPITAL_BASED_OUTPATIENT_CLINIC_OR_DEPARTMENT_OTHER): Payer: BLUE CROSS/BLUE SHIELD

## 2017-07-19 DIAGNOSIS — M5431 Sciatica, right side: Secondary | ICD-10-CM | POA: Diagnosis not present

## 2017-07-19 DIAGNOSIS — R609 Edema, unspecified: Secondary | ICD-10-CM

## 2017-07-19 DIAGNOSIS — R0602 Shortness of breath: Secondary | ICD-10-CM | POA: Insufficient documentation

## 2017-07-19 DIAGNOSIS — I10 Essential (primary) hypertension: Secondary | ICD-10-CM | POA: Insufficient documentation

## 2017-07-19 DIAGNOSIS — E119 Type 2 diabetes mellitus without complications: Secondary | ICD-10-CM | POA: Insufficient documentation

## 2017-07-19 DIAGNOSIS — M79604 Pain in right leg: Secondary | ICD-10-CM

## 2017-07-19 LAB — CBC
HCT: 39.4 % (ref 36.0–46.0)
HEMOGLOBIN: 12 g/dL (ref 12.0–15.0)
MCH: 23.8 pg — ABNORMAL LOW (ref 26.0–34.0)
MCHC: 30.5 g/dL (ref 30.0–36.0)
MCV: 78 fL (ref 78.0–100.0)
Platelets: 368 10*3/uL (ref 150–400)
RBC: 5.05 MIL/uL (ref 3.87–5.11)
RDW: 16.8 % — ABNORMAL HIGH (ref 11.5–15.5)
WBC: 13.7 10*3/uL — AB (ref 4.0–10.5)

## 2017-07-19 LAB — URINALYSIS, ROUTINE W REFLEX MICROSCOPIC
Bacteria, UA: NONE SEEN
Bilirubin Urine: NEGATIVE
GLUCOSE, UA: NEGATIVE mg/dL
HGB URINE DIPSTICK: NEGATIVE
Ketones, ur: NEGATIVE mg/dL
Nitrite: NEGATIVE
PROTEIN: NEGATIVE mg/dL
Specific Gravity, Urine: 1.016 (ref 1.005–1.030)
pH: 7 (ref 5.0–8.0)

## 2017-07-19 LAB — COMPREHENSIVE METABOLIC PANEL
ALK PHOS: 91 U/L (ref 38–126)
ALT: 28 U/L (ref 14–54)
ANION GAP: 9 (ref 5–15)
AST: 19 U/L (ref 15–41)
Albumin: 3.5 g/dL (ref 3.5–5.0)
BILIRUBIN TOTAL: 0.5 mg/dL (ref 0.3–1.2)
BUN: 7 mg/dL (ref 6–20)
CALCIUM: 9.2 mg/dL (ref 8.9–10.3)
CO2: 26 mmol/L (ref 22–32)
Chloride: 106 mmol/L (ref 101–111)
Creatinine, Ser: 1.02 mg/dL — ABNORMAL HIGH (ref 0.44–1.00)
Glucose, Bld: 138 mg/dL — ABNORMAL HIGH (ref 65–99)
Potassium: 3.8 mmol/L (ref 3.5–5.1)
Sodium: 141 mmol/L (ref 135–145)
Total Protein: 7 g/dL (ref 6.5–8.1)

## 2017-07-19 LAB — LIPASE, BLOOD: Lipase: 32 U/L (ref 11–51)

## 2017-07-19 LAB — I-STAT BETA HCG BLOOD, ED (MC, WL, AP ONLY): I-stat hCG, quantitative: <5 m[IU]/mL (ref <5)

## 2017-07-19 MED ORDER — RIVAROXABAN 15 MG PO TABS: 15.0000 mg | ORAL_TABLET | Freq: Once | ORAL | Status: DC

## 2017-07-19 MED ORDER — RIVAROXABAN (XARELTO) EDUCATION KIT FOR DVT/PE PATIENTS: PACK | Freq: Once | Status: DC

## 2017-07-19 MED ORDER — IOPAMIDOL (ISOVUE-370) INJECTION 76%
INTRAVENOUS | Status: AC
  Filled 2017-07-19: qty 100

## 2017-07-19 MED ORDER — HYDROCODONE-HOMATROPINE 5-1.5 MG/5ML PO SYRP
5.0000 mL | ORAL_SOLUTION | Freq: Once | ORAL | Status: AC
  Administered 2017-07-19: 5 mL via ORAL
  Filled 2017-07-19: qty 5

## 2017-07-19 MED ORDER — TRAMADOL HCL 50 MG PO TABS: 50.0000 mg | ORAL_TABLET | Freq: Four times a day (QID) | ORAL | 0 refills | Status: DC | PRN

## 2017-07-19 MED ORDER — METHOCARBAMOL 500 MG PO TABS: 500.0000 mg | ORAL_TABLET | Freq: Three times a day (TID) | ORAL | 0 refills | Status: DC | PRN

## 2017-07-19 MED ORDER — RIVAROXABAN 20 MG PO TABS: 20.0000 mg | ORAL_TABLET | Freq: Every day | ORAL | 2 refills | Status: DC

## 2017-07-19 MED ORDER — IOPAMIDOL (ISOVUE-370) INJECTION 76%
100.0000 mL | Freq: Once | INTRAVENOUS | Status: AC | PRN
  Administered 2017-07-19: 56 mL via INTRAVENOUS

## 2017-07-19 NOTE — ED Notes (Signed)
Pt in room Alert, no family present VS documented Call light within reach

## 2017-07-19 NOTE — Progress Notes (Signed)
Bilateral lower extremity venous duplex has been completed. Negative for obvious evidence of DVT. Results were given to Dr. Lacinda Axon.  07/19/17 8:19 AM Carlos Levering RVT

## 2017-07-19 NOTE — ED Notes (Signed)
Patient transported to CT 

## 2017-07-19 NOTE — ED Triage Notes (Signed)
Pt states that for the past two weeks she has been having pain in her R leg, swelling in her feet after working that resolves in the morning. Hx of DVT. Pt states that she has had upper abd pain on and off since May after having a miscarriage. Denies n/v/d.

## 2017-07-19 NOTE — ED Provider Notes (Signed)
Bilateral lower extremity Doppler study negative for DVT.  Discussed with the patient.  She is hemodynamically stable at discharge.  Discharge medications Xarelto 20 mg daily per recommendation of hematologist   Nat Christen, MD 07/19/17 1130

## 2017-07-19 NOTE — ED Notes (Signed)
Pt's O2 sats observed to be in the low 80's on room air while sleeping;  pt placed on 2L O2 via [place]; O2 sats now in the upper 90's

## 2017-07-19 NOTE — Discharge Instructions (Addendum)
I suspect your pain today is from your back. Take the meds as prescribed. Try gentle stretching to help relieve pressure on your back. You may benefit from physical therapy as well.  According to Dr. Antonieta Pert notes, you should be on anticoagulation (blood thinner). We will prescribe this to you but it's important to follow-up with Dr. Marin Olp in the next several weeks.  Ultrasound of your legs negative for DVT.  Prescription for Xarelto.

## 2017-07-19 NOTE — ED Notes (Signed)
Patient transported to vascular. 

## 2017-07-19 NOTE — ED Provider Notes (Signed)
[facility] EMERGENCY DEPARTMENT Provider Note   CSN: [id number] Arrival date & time: 07/19/17  0038     History   Chief Complaint Chief Complaint  Patient presents with  . Leg Pain  . Abdominal Pain    HPI Virginia Fitzgerald is a 32 y.o. female.  HPI 32 year old female with past medical history of DVT status post IVC filter, diabetes, here with leg pain and reported abdominal pain.  Her primary complaint is right leg pain.  She states that for the last several days, she had an aching, throbbing, shooting pain down her right leg.  Is primarily down the back of her leg but occasionally over the front.  She has some associated lower back pain.  The pain is worse when she is at work and standing for long periods of time.  She also notes increased bilateral lower extremity edema.  This also occurs when she is standing for long periods of time and resolves when she elevates her feet.  She also states that she is occasionally coughing and short of breath.  This is been a chronic issue since her PEs.  She does state that it has slightly worsened recently.  She also endorses diffuse abdominal pain.  She recently had a miscarriage several months ago.  States she said intermittent, sharp, diffuse pain since then but this is not acutely worsened.  No fevers or chills.  No adnexal or lower abdominal pain.  No dysuria or vaginal bleeding or discharge  Past Medical History:  Diagnosis Date  . Anemia   . Bladder infection 03/2017  . Diabetes mellitus without complication ([place])    WITH 2019 PREGNANCY ONLY  . GERD (gastroesophageal reflux disease)    diet controlled - no meds  . Headache    last one 05/25/17 - otc med  . History of blood transfusion 2012   At [facility]  . History of DVT of lower extremity   . History of hiatal hernia   . MRSA infection    not in EPIC - per patient in 2011  . Pulmonary embolism ([place])   . S/P IVC filter   . SVD (spontaneous vaginal delivery)    x 3  .  Thrombocytosis [facility])     Patient Active Problem List   Diagnosis Date Noted  . Missed abortion 05/29/2017  . Dysplasia of cervix, low grade (CIN 1) 03/28/2017  . 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 Essential hypertension, benign 03/20/2017  . Recurrent pulmonary embolism ([place]) 03/20/2017  . BMI 50.0-59.9, adult ([place]) 02/28/2017  . Iron deficiency anemia 11/14/2011    Past Surgical History:  Procedure Laterality Date  . DILATION AND EVACUATION N/A 05/29/2017   Procedure: DILATATION AND EVACUATION;  Surgeon: Lavonia Drafts, MD;  Location: [facility];  Service: Gynecology;  Laterality: N/A;  . WISDOM TOOTH EXTRACTION       OB History    Gravida  5   Para  3   Term  3   Preterm  0   AB  2   Living  3     SAB  2   TAB  0   Ectopic  0   Multiple      Live Births  3        Obstetric Comments  Largest prior was 7lbs 7oz. No issues with deliveries.          Home Medications    Prior to Admission medications   Medication Sig Start  Date End Date Taking? Authorizing Provider  acetaminophen (TYLENOL) 500 MG tablet Take 1,000 mg by mouth every 6 (six) hours as needed for moderate pain or headache.   Yes [provider]  ibuprofen (ADVIL,MOTRIN) 600 MG tablet Take 1 tablet (600 mg total) by mouth every 6 (six) hours as needed. Patient taking differently: Take 600 mg by mouth every 6 (six) hours as needed for moderate pain.  05/29/17  Yes Lavonia Drafts, MD  oxyCODONE-acetaminophen (PERCOCET/ROXICET) 5-325 MG tablet Take 1 tablet by mouth every 6 (six) hours as needed. Patient taking differently: Take 1 tablet by mouth every 6 (six) hours as needed for moderate pain.  05/29/17  Yes Harraway-Smith, Hoyle Sauer, MD  ACCU-CHEK FASTCLIX LANCETS MISC 1 Device by Percutaneous route 4 (four) times daily. Patient not taking: Reported on 05/27/2017 03/28/17   Aletha Halim, MD  Blood Glucose Monitoring Suppl (ACCU-CHEK GUIDE) w/Device KIT 1  Device by Does not apply route 4 (four) times daily. Patient not taking: Reported on 05/27/2017 04/10/17   Donnamae Jude, MD  enoxaparin (LOVENOX) 150 MG/ML injection Inject 0.93 mLs (140 mg total) into the skin every 12 (twelve) hours. Patient not taking: Reported on 07/19/2017 03/13/17   Forde Dandy, PharmD  glucose blood (ACCU-CHEK GUIDE) test strip Use as instructed QID Patient not taking: Reported on 05/27/2017 04/10/17   Donnamae Jude, MD  methocarbamol (ROBAXIN) 500 MG tablet Take 1 tablet (500 mg total) by mouth every 8 (eight) hours as needed for muscle spasms. 07/19/17   Duffy Bruce, MD  traMADol (ULTRAM) 50 MG tablet Take 1 tablet (50 mg total) by mouth every 6 (six) hours as needed for severe pain. 07/19/17   Duffy Bruce, MD    Family History Family History  Problem Relation Age of Onset  . Diabetes Mother   . Hypertension Mother   . Hypertension Maternal Uncle   . Diabetes Maternal Uncle   . Hypertension Maternal Grandmother     Social History Social History   Tobacco Use  . Smoking status: Never Smoker  . Smokeless tobacco: Never Used  . Tobacco comment: never used tobacco  Substance Use Topics  . Alcohol use: No    Alcohol/week: 0.0 oz  . Drug use: No     Allergies   Patient has no known allergies.   Review of Systems Review of Systems  Constitutional: Positive for fatigue. Negative for chills and fever.  HENT: Negative for congestion, rhinorrhea and sore throat.   Eyes: Negative for visual disturbance.  Respiratory: Positive for cough. Negative for shortness of breath and wheezing.   Cardiovascular: Negative for chest pain and leg swelling.  Gastrointestinal: Negative for abdominal pain, diarrhea, nausea and vomiting.  Genitourinary: Negative for dysuria, flank pain, vaginal bleeding and vaginal discharge.  Musculoskeletal: Positive for arthralgias and gait problem (2/2 leg pain). Negative for neck pain.  Skin: Negative for rash.    Allergic/Immunologic: Negative for immunocompromised state.  Neurological: Negative for syncope and headaches.  Hematological: Does not bruise/bleed easily.  All other systems reviewed and are negative.    Physical Exam Updated Vital Signs BP (!) 150/88 (BP Location: Left Arm)   Pulse 89   Temp 98.3 F (36.8 C) (Oral)   Resp 20   SpO2 99%   Physical Exam  Constitutional: She is oriented to person, place, and time. She appears well-developed and well-nourished. No distress.  HENT:  Head: Normocephalic and atraumatic.  Eyes: Conjunctivae are normal.  Neck: Neck supple.  Cardiovascular: Normal rate, regular  rhythm and normal heart sounds. Exam reveals no friction rub.  No murmur heard. Pulmonary/Chest: Effort normal and breath sounds normal. No respiratory distress. She has no wheezes. She has no rales.  Abdominal: She exhibits no distension. There is no tenderness.  Musculoskeletal: She exhibits no edema.  Trace bilateral lower extremity edema.  No pitting.  No popliteal tenderness.  No palpable cords.  Neurological: She is alert and oriented to person, place, and time. She exhibits normal muscle tone.  Skin: Skin is warm. Capillary refill takes less than 2 seconds.  Psychiatric: She has a normal mood and affect.  Nursing note and vitals reviewed.   Spine Exam: Inspection/Palpation: Moderate paraspinal lower lumbar pain.  Tenderness over the right sciatic nerve. Strength: 5/5 throughout LE bilaterally (hip flexion/extension, adduction/abduction; knee flexion/extension; foot dorsiflexion/plantarflexion, inversion/eversion; great toe inversion) Sensation: Intact to light touch in proximal and distal LE bilaterally Reflexes: 2+ quadriceps and achilles reflexes   ED Treatments / Results  Labs (all labs ordered are listed, but only abnormal results are displayed) Labs Reviewed  COMPREHENSIVE METABOLIC PANEL - Abnormal; Notable for the following components:      Result Value    Glucose, Bld 138 (*)    Creatinine, Ser 1.02 (*)    All other components within normal limits  CBC - Abnormal; Notable for the following components:   WBC 13.7 (*)    MCH 23.8 (*)    RDW 16.8 (*)    All other components within normal limits  URINALYSIS, ROUTINE W REFLEX MICROSCOPIC - Abnormal; Notable for the following components:   Leukocytes, UA SMALL (*)    All other components within normal limits  LIPASE, BLOOD  I-STAT BETA HCG BLOOD, ED ([facility], [facility], [facility] ONLY)    EKG None  Radiology Ct Angio Chest Pe W And/or Wo Contrast  Result Date: 07/19/2017 CLINICAL DATA:  Two week history of pain in the right leg, swelling in the feet. History of DVTs. Upper abdominal pain since May after having a miscarriage. EXAM: CT ANGIOGRAPHY CHEST WITH CONTRAST TECHNIQUE: Multidetector CT imaging of the chest was performed using the standard protocol during bolus administration of intravenous contrast. Multiplanar CT image reconstructions and MIPs were obtained to evaluate the vascular anatomy. CONTRAST:  70m ISOVUE-370 IOPAMIDOL (ISOVUE-370) INJECTION 76% COMPARISON:  02/24/2017 FINDINGS: Cardiovascular: Examination is technically limited due to body habitus and technique. There is moderately good opacification of the central and segmental pulmonary arteries. No filling defects are identified. No evidence of significant pulmonary embolus. Small peripheral emboli could be obscured due to technique. Normal caliber thoracic aorta. Normal heart size. No pericardial effusion. Mediastinum/Nodes: No significant lymphadenopathy in the chest. Small esophageal hiatal hernia. Esophagus is decompressed. Lungs/Pleura: Motion artifact limits examination. There is dependent atelectasis in the lung bases. No consolidation or edema. Airways are patent. No pneumothorax. No pleural effusions. Upper Abdomen: Diffuse fatty infiltration of the liver. Musculoskeletal: No chest wall abnormality. No acute or significant osseous  findings. Review of the MIP images confirms the above findings. IMPRESSION: 1. Technically limited study but no evidence of significant central pulmonary embolus. 2. No evidence of active pulmonary disease. 3. Small esophageal hiatal hernia. 4. Diffuse fatty infiltration of the liver. Electronically Signed   By: WLucienne CapersM.D.   On: 07/19/2017 05:49    Procedures Procedures (including critical care time)  Medications Ordered in ED Medications  iopamidol (ISOVUE-370) 76 % injection (has no administration in time range)  HYDROcodone-homatropine (HYCODAN) 5-1.5 MG/5ML syrup 5 mL (5 mLs Oral Given  07/19/17 0426)  iopamidol (ISOVUE-370) 76 % injection 100 mL (56 mLs Intravenous Contrast Given 07/19/17 0540)     Initial Impression / Assessment and Plan / ED Course  I have reviewed the triage vital signs and the nursing notes.  Pertinent labs & imaging results that were available during my care of the patient were reviewed by me and considered in my medical decision making (see chart for details).     32 yo F here with multiple complaints.   Primary complaint is leg pain - likely sciatica. Correlates with standing on feet for extended amount of time with + straight leg raise. Discussed stretching, weight loss with pt. No signs of cauda equina/cord compression. Given pt's history of DVT, will check U/S but plan to treat as MSK if negative.  Pt also has multiple chronic complaints of abd pain, intermittent CP, SOB. Labs are reassuring. CXR and CT Angio neg. EKG non-ischemic. Doubt PE, ACS, or other acute emergent pathology.  At sign out, DVT study pending. Current plan: -Treat for suspected sciatica/MSK pain with tramadol, short course of robaxin -F/u DVT study - if positive, will need the DVT dosing of Xarelto (15 mg BID then 20 mg QD); if negative, pt needs lifelong anticoag per Dr. Antonieta Pert note - will refill her maintenance dose of 20 mg QD  Final Clinical Impressions(s) / ED  Diagnoses   Final diagnoses:  Right leg pain  Sciatica, right side    ED Discharge Orders        Ordered    traMADol (ULTRAM) 50 MG tablet  Every 6 hours PRN     07/19/17 0746    methocarbamol (ROBAXIN) 500 MG tablet  Every 8 hours PRN     07/19/17 0746       Duffy Bruce, MD 07/19/17 8350

## 2017-07-19 NOTE — ED Notes (Signed)
IV access attempted by 2 RNs. IV team consulted.

## 2017-07-27 ENCOUNTER — Encounter (HOSPITAL_COMMUNITY): Payer: Self-pay | Admitting: Emergency Medicine

## 2017-07-27 ENCOUNTER — Emergency Department (HOSPITAL_COMMUNITY): Payer: BLUE CROSS/BLUE SHIELD

## 2017-07-27 ENCOUNTER — Observation Stay (HOSPITAL_COMMUNITY)
Admission: EM | Admit: 2017-07-27 | Discharge: 2017-07-29 | DRG: 391 | Disposition: A | Discharge status: Home / Self Care | Payer: BLUE CROSS/BLUE SHIELD | Attending: Internal Medicine | Admitting: Internal Medicine

## 2017-07-27 ENCOUNTER — Other Ambulatory Visit: Payer: Self-pay

## 2017-07-27 DIAGNOSIS — K8020 Calculus of gallbladder without cholecystitis without obstruction: Secondary | ICD-10-CM | POA: Diagnosis present

## 2017-07-27 DIAGNOSIS — E876 Hypokalemia: Secondary | ICD-10-CM | POA: Diagnosis not present

## 2017-07-27 DIAGNOSIS — Z6841 Body Mass Index (BMI) 40.0 and over, adult: Secondary | ICD-10-CM

## 2017-07-27 DIAGNOSIS — E119 Type 2 diabetes mellitus without complications: Secondary | ICD-10-CM | POA: Diagnosis not present

## 2017-07-27 DIAGNOSIS — Z7901 Long term (current) use of anticoagulants: Secondary | ICD-10-CM | POA: Diagnosis not present

## 2017-07-27 DIAGNOSIS — K529 Noninfective gastroenteritis and colitis, unspecified: Secondary | ICD-10-CM | POA: Diagnosis present

## 2017-07-27 DIAGNOSIS — R319 Hematuria, unspecified: Secondary | ICD-10-CM | POA: Diagnosis present

## 2017-07-27 DIAGNOSIS — K219 Gastro-esophageal reflux disease without esophagitis: Secondary | ICD-10-CM | POA: Diagnosis present

## 2017-07-27 DIAGNOSIS — D509 Iron deficiency anemia, unspecified: Secondary | ICD-10-CM | POA: Diagnosis not present

## 2017-07-27 DIAGNOSIS — Z95828 Presence of other vascular implants and grafts: Secondary | ICD-10-CM | POA: Diagnosis not present

## 2017-07-27 DIAGNOSIS — Z86711 Personal history of pulmonary embolism: Secondary | ICD-10-CM | POA: Diagnosis not present

## 2017-07-27 DIAGNOSIS — I10 Essential (primary) hypertension: Secondary | ICD-10-CM | POA: Diagnosis present

## 2017-07-27 DIAGNOSIS — I2699 Other pulmonary embolism without acute cor pulmonale: Secondary | ICD-10-CM | POA: Diagnosis not present

## 2017-07-27 DIAGNOSIS — K760 Fatty (change of) liver, not elsewhere classified: Secondary | ICD-10-CM | POA: Diagnosis not present

## 2017-07-27 DIAGNOSIS — Z86718 Personal history of other venous thrombosis and embolism: Secondary | ICD-10-CM | POA: Diagnosis not present

## 2017-07-27 DIAGNOSIS — Z79899 Other long term (current) drug therapy: Secondary | ICD-10-CM | POA: Diagnosis not present

## 2017-07-27 DIAGNOSIS — A09 Infectious gastroenteritis and colitis, unspecified: Principal | ICD-10-CM | POA: Diagnosis present

## 2017-07-27 HISTORY — DX: Noninfective gastroenteritis and colitis, unspecified: K52.9

## 2017-07-27 LAB — CBC
HEMATOCRIT: 40.6 % (ref 36.0–46.0)
Hemoglobin: 12.4 g/dL (ref 12.0–15.0)
MCH: 23.6 pg — ABNORMAL LOW (ref 26.0–34.0)
MCHC: 30.5 g/dL (ref 30.0–36.0)
MCV: 77.2 fL — AB (ref 78.0–100.0)
PLATELETS: 357 10*3/uL (ref 150–400)
RBC: 5.26 MIL/uL — ABNORMAL HIGH (ref 3.87–5.11)
RDW: 16.9 % — AB (ref 11.5–15.5)
WBC: 13.3 10*3/uL — ABNORMAL HIGH (ref 4.0–10.5)

## 2017-07-27 LAB — COMPREHENSIVE METABOLIC PANEL
ALBUMIN: 3.2 g/dL — AB (ref 3.5–5.0)
ALT: 33 U/L (ref 0–44)
AST: 23 U/L (ref 15–41)
Alkaline Phosphatase: 86 U/L (ref 38–126)
Anion gap: 8 (ref 5–15)
BILIRUBIN TOTAL: 0.4 mg/dL (ref 0.3–1.2)
CHLORIDE: 104 mmol/L (ref 98–111)
CO2: 26 mmol/L (ref 22–32)
CREATININE: 0.81 mg/dL (ref 0.44–1.00)
Calcium: 8.5 mg/dL — ABNORMAL LOW (ref 8.9–10.3)
GFR calc Af Amer: >60 mL/min (ref >60)
GLUCOSE: 134 mg/dL — AB (ref 70–99)
Potassium: 3.6 mmol/L (ref 3.5–5.1)
Sodium: 138 mmol/L (ref 135–145)
TOTAL PROTEIN: 6.8 g/dL (ref 6.5–8.1)

## 2017-07-27 LAB — URINALYSIS, ROUTINE W REFLEX MICROSCOPIC
BACTERIA UA: NONE SEEN
BILIRUBIN URINE: NEGATIVE
Glucose, UA: NEGATIVE mg/dL
Ketones, ur: NEGATIVE mg/dL
Nitrite: NEGATIVE
PH: 5 (ref 5.0–8.0)
Protein, ur: 100 mg/dL — AB
RBC / HPF: >50 RBC/hpf — ABNORMAL HIGH (ref 0–5)
Specific Gravity, Urine: 1.028 (ref 1.005–1.030)
WBC, UA: >50 WBC/hpf — ABNORMAL HIGH (ref 0–5)

## 2017-07-27 LAB — LIPASE, BLOOD: Lipase: 25 U/L (ref 11–51)

## 2017-07-27 LAB — I-STAT BETA HCG BLOOD, ED (MC, WL, AP ONLY)

## 2017-07-27 MED ORDER — METRONIDAZOLE IN NACL 5-0.79 MG/ML-% IV SOLN
500.0000 mg | Freq: Once | INTRAVENOUS | Status: AC
  Administered 2017-07-28: 500 mg via INTRAVENOUS
  Filled 2017-07-27: qty 100

## 2017-07-27 MED ORDER — SODIUM CHLORIDE 0.9 % IV SOLN
INTRAVENOUS | Status: DC
  Administered 2017-07-27: 22:00:00 via INTRAVENOUS

## 2017-07-27 MED ORDER — IOHEXOL 300 MG/ML  SOLN
100.0000 mL | Freq: Once | INTRAMUSCULAR | Status: AC | PRN
  Administered 2017-07-27: 100 mL via INTRAVENOUS

## 2017-07-27 MED ORDER — ONDANSETRON HCL 4 MG PO TABS: 4.0000 mg | ORAL_TABLET | Freq: Four times a day (QID) | ORAL | Status: DC | PRN

## 2017-07-27 MED ORDER — RIVAROXABAN 20 MG PO TABS
20.0000 mg | ORAL_TABLET | Freq: Every day | ORAL | Status: DC
  Administered 2017-07-28 – 2017-07-29 (×2): 20 mg via ORAL
  Filled 2017-07-27 (×2): qty 1

## 2017-07-27 MED ORDER — HYDROMORPHONE HCL 1 MG/ML IJ SOLN
0.5000 mg | Freq: Once | INTRAMUSCULAR | Status: AC
  Administered 2017-07-27: 0.5 mg via INTRAVENOUS
  Filled 2017-07-27: qty 1

## 2017-07-27 MED ORDER — ACETAMINOPHEN 500 MG PO TABS
1000.0000 mg | ORAL_TABLET | Freq: Four times a day (QID) | ORAL | Status: DC | PRN
  Administered 2017-07-27 – 2017-07-28 (×3): 1000 mg via ORAL
  Filled 2017-07-27 (×4): qty 2

## 2017-07-27 MED ORDER — SODIUM CHLORIDE 0.9 % IV BOLUS
1000.0000 mL | Freq: Once | INTRAVENOUS | Status: AC
Start: 2017-07-27 — End: 2017-07-27
  Administered 2017-07-27: 1000 mL via INTRAVENOUS

## 2017-07-27 MED ORDER — ONDANSETRON HCL 4 MG/2ML IJ SOLN: 4.0000 mg | Freq: Four times a day (QID) | INTRAMUSCULAR | Status: DC | PRN

## 2017-07-27 MED ORDER — SODIUM CHLORIDE 0.9 % IV BOLUS
1000.0000 mL | Freq: Once | INTRAVENOUS | Status: AC
  Administered 2017-07-27: 1000 mL via INTRAVENOUS

## 2017-07-27 MED ORDER — INSULIN ASPART 100 UNIT/ML ~~LOC~~ SOLN
0.0000 [IU] | Freq: Three times a day (TID) | SUBCUTANEOUS | Status: DC
  Administered 2017-07-28: 1 [IU] via SUBCUTANEOUS
  Administered 2017-07-28: 2 [IU] via SUBCUTANEOUS

## 2017-07-27 MED ORDER — CIPROFLOXACIN IN D5W 400 MG/200ML IV SOLN
400.0000 mg | Freq: Once | INTRAVENOUS | Status: AC
  Administered 2017-07-27: 400 mg via INTRAVENOUS
  Filled 2017-07-27: qty 200

## 2017-07-27 NOTE — H&P (Signed)
History and Physical    Virginia Fitzgerald XNT:[id number] DOB: 1985/03/21 DOA: 07/27/2017  Referring MD/NP/PA: Dr. Roderic Palau  PCP: Patient, No Pcp Per   Patient coming from: Home  Chief Complaint: Abdominal pain nausea vomiting  HPI: Virginia Fitzgerald is a 32 y.o. female with medical history significant of diabetes which is diet controlled, recurrent pulmonary embolism with patient having 3 episodes so far.  Currently on chronic anticoagulation, history of previous MRSA infection, morbid obesity who is an Agricultural consultant at the [employer] and has had exposure to some chemicals she believes.  Patient has been having nausea vomiting with abdominal pain.  She also noted bloody diarrhea today.  Associated with abdominal cramping.  Patient came to the ER where she was evaluated.  She is not sure if the blood she is so in her stool is coming from her stool or from her ongoing menstrual period.  ED Course: Temperature is 98.7 blood pressures 150/102 with a pulse 109 respiratory of 12 oxygen sats 98% on room air.  White count is 13.3 with hemoglobin 12.4 platelets 357.  Sodium 134.  Urinalysis showed hematuria but again patient is having her periods.  CT abdomen and pelvis showed Mild-to-moderate diffuse thickening of the ascending through descending colon with subtle pericolonic inflammation suggested. Findings raises suspicion for a colitis. Also, Hepatic steatosis with uncomplicated cholelithiasis. Mild soft tissue induration about the patient's pannus may represent a panniculitis  Review of Systems: As per HPI otherwise 10 point review of systems negative.    Past Medical History:  Diagnosis Date  . Anemia   . Bladder infection 03/2017  . Diabetes mellitus without complication ([place])    WITH 2019 PREGNANCY ONLY  . GERD (gastroesophageal reflux disease)    diet controlled - no meds  . Headache    last one 05/25/17 - otc med  . History of blood transfusion 2012   At [facility]  . History of DVT of lower  extremity   . History of hiatal hernia   . MRSA infection    not in EPIC - per patient in 2011  . Pulmonary embolism ([place])   . S/P IVC filter   . SVD (spontaneous vaginal delivery)    x 3  . Thrombocytosis ([place])     Past Surgical History:  Procedure Laterality Date  . DILATION AND EVACUATION N/A 05/29/2017   Procedure: DILATATION AND EVACUATION;  Surgeon: Lavonia Drafts, MD;  Location: [facility];  Service: Gynecology;  Laterality: N/A;  . WISDOM TOOTH EXTRACTION       reports that she has never smoked. She has never used smokeless tobacco. She reports that she does not drink alcohol or use drugs.  No Known Allergies  Family History  Problem Relation Age of Onset  . Diabetes Mother   . Hypertension Mother   . Hypertension Maternal Uncle   . Diabetes Maternal Uncle   . Hypertension Maternal Grandmother     Prior to Admission medications   Medication Sig Start Date End Date Taking? Authorizing Provider  acetaminophen (TYLENOL) 500 MG tablet Take 1,000 mg by mouth every 6 (six) hours as needed for moderate pain or headache.   Yes [provider]  rivaroxaban (XARELTO) 20 MG TABS tablet Take 1 tablet (20 mg total) by mouth daily with supper. 07/19/17  Yes Nat Christen, MD  ACCU-CHEK FASTCLIX LANCETS MISC 1 Device by Percutaneous route 4 (four) times daily. Patient not taking: Reported on 05/27/2017 03/28/17   Aletha Halim, MD  Blood Glucose Monitoring Suppl (  ACCU-CHEK GUIDE) w/Device KIT 1 Device by Does not apply route 4 (four) times daily. Patient not taking: Reported on 05/27/2017 04/10/17   Donnamae Jude, MD  enoxaparin (LOVENOX) 150 MG/ML injection Inject 0.93 mLs (140 mg total) into the skin every 12 (twelve) hours. Patient not taking: Reported on 07/19/2017 03/13/17   Forde Dandy, PharmD  glucose blood (ACCU-CHEK GUIDE) test strip Use as instructed QID Patient not taking: Reported on 05/27/2017 04/10/17   Donnamae Jude, MD    Physical Exam: Vitals:    07/27/17 1556 07/27/17 1700  BP: (!) 139/102 (!) 152/93  Pulse: (!) 126 (!) 109  Resp: 18 (!) 21  Temp: 99.2 F (37.3 C)   TempSrc: Oral   SpO2: 98% 97%      Constitutional: NAD, calm, comfortable Vitals:   07/27/17 1556 07/27/17 1700  BP: (!) 139/102 (!) 152/93  Pulse: (!) 126 (!) 109  Resp: 18 (!) 21  Temp: 99.2 F (37.3 C)   TempSrc: Oral   SpO2: 98% 97%   Eyes: PERRL, lids and conjunctivae normal ENMT: Mucous membranes are moist. Posterior pharynx clear of any exudate or lesions.Normal dentition.  Neck: normal, supple, no masses, no thyromegaly Respiratory: clear to auscultation bilaterally, no wheezing, no crackles. Normal respiratory effort. No accessory muscle use.  Cardiovascular: Regular rate and rhythm, no murmurs / rubs / gallops. No extremity edema. 2+ pedal pulses. No carotid bruits.  Abdomen: Some tenderness, no masses palpated. No hepatosplenomegaly. Bowel sounds positive.  Musculoskeletal: no clubbing / cyanosis. No joint deformity upper and lower extremities. Good ROM, no contractures. Normal muscle tone.  Skin: no rashes, lesions, ulcers. No induration Neurologic: CN 2-12 grossly intact. Sensation intact, DTR normal. Strength 5/5 in all 4.  Psychiatric: Normal judgment and insight. Alert and oriented x 3. Normal mood.   Labs on Admission: I have personally reviewed following labs and imaging studies  CBC: Recent Labs  Lab 07/27/17 1611  WBC 13.3*  HGB 12.4  HCT 40.6  MCV 77.2*  PLT 643   Basic Metabolic Panel: Recent Labs  Lab 07/27/17 1611  NA 138  K 3.6  CL 104  CO2 26  GLUCOSE 134*  BUN <5*  CREATININE 0.81  CALCIUM 8.5*   GFR: CrCl cannot be calculated (Unknown ideal weight.). Liver Function Tests: Recent Labs  Lab 07/27/17 1611  AST 23  ALT 33  ALKPHOS 86  BILITOT 0.4  PROT 6.8  ALBUMIN 3.2*   Recent Labs  Lab 07/27/17 1611  LIPASE 25   No results for input(s): AMMONIA in the last 168 hours. Coagulation Profile: No  results for input(s): INR, PROTIME in the last 168 hours. Cardiac Enzymes: No results for input(s): CKTOTAL, CKMB, CKMBINDEX, TROPONINI in the last 168 hours. BNP (last 3 results) No results for input(s): PROBNP in the last 8760 hours. HbA1C: No results for input(s): HGBA1C in the last 72 hours. CBG: No results for input(s): GLUCAP in the last 168 hours. Lipid Profile: No results for input(s): CHOL, HDL, LDLCALC, TRIG, CHOLHDL, LDLDIRECT in the last 72 hours. Thyroid Function Tests: No results for input(s): TSH, T4TOTAL, FREET4, T3FREE, THYROIDAB in the last 72 hours. Anemia Panel: No results for input(s): VITAMINB12, FOLATE, FERRITIN, TIBC, IRON, RETICCTPCT in the last 72 hours. Urine analysis:    Component Value Date/Time   COLORURINE AMBER (A) 07/27/2017 1702   APPEARANCEUR CLOUDY (A) 07/27/2017 1702   LABSPEC 1.028 07/27/2017 1702   PHURINE 5.0 07/27/2017 1702   GLUCOSEU NEGATIVE 07/27/2017 1702  HGBUR LARGE (A) 07/27/2017 1702   BILIRUBINUR NEGATIVE 07/27/2017 1702   KETONESUR NEGATIVE 07/27/2017 1702   PROTEINUR 100 (A) 07/27/2017 1702   UROBILINOGEN 0.2 03/20/2017 1356   NITRITE NEGATIVE 07/27/2017 1702   LEUKOCYTESUR SMALL (A) 07/27/2017 1702   Sepsis Labs: '@LABRCNTIP' (procalcitonin:4,lacticidven:4) )No results found for this or any previous visit (from the past 240 hour(s)).   Radiological Exams on Admission: Ct Abdomen Pelvis W Contrast  Result Date: 07/27/2017 CLINICAL DATA:  Abdominal distention and periumbilical pain EXAM: CT ABDOMEN AND PELVIS WITH CONTRAST TECHNIQUE: Multidetector CT imaging of the abdomen and pelvis was performed using the standard protocol following bolus administration of intravenous contrast. CONTRAST:  162m OMNIPAQUE IOHEXOL 300 MG/ML  SOLN COMPARISON:  11/13/2014 FINDINGS: Lower chest: Top normal heart size without pericardial effusion. Bibasilar dependent atelectasis is noted. Hepatobiliary: Hepatic steatosis without biliary dilatation or  mass. Several partially calcified gallstones are noted without secondary signs of acute cholecystitis. Pancreas: No focal mass or ductal dilatation. No peripancreatic inflammation. Spleen: Normal size spleen without mass. Adrenals/Urinary Tract: Adrenal glands are unremarkable. Kidneys are normal, without renal calculi, focal lesion, or hydronephrosis. Bladder is unremarkable. Stomach/Bowel: The stomach is decompressed. There is a small hiatal hernia. Normal duodenal sweep and ligament of Treitz position. No small bowel dilatation or obstruction. Normal appendix is visualized. Mild-to-moderate diffuse transmural thickening of the colon with subtle pericolonic inflammation is identified along the ascending, transverse and descending colon suspicious for changes of colitis. Vascular/Lymphatic: No significant vascular findings are present. No enlarged abdominal or pelvic lymph nodes. Reproductive: Uterus and bilateral adnexa are unremarkable. Other: Subcutaneous soft tissue induration about the patient's pannus may represent mild panniculitis. Musculoskeletal: No acute or significant osseous findings. IMPRESSION: 1. Mild-to-moderate diffuse thickening of the ascending through descending colon with subtle pericolonic inflammation suggested. Findings raises suspicion for a colitis. 2. Hepatic steatosis with uncomplicated cholelithiasis. 3. Mild soft tissue induration about the patient's pannus may represent a panniculitis Electronically Signed   By: DAshley RoyaltyM.D.   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  Based on patient's description of her diarrhea and her exposure level will be worried about organisms like Salmonella, Yersinia etc.  Patient will be admitted  for supportive care.  IV antibiotics.  We will get stool studies for C. difficile as well as stool pathogens.  Empirically continue with Cipro as well as Flagyl.  Hydrate patient.  #2 recurrent DVTs: With 3 episode of DVTs I will continue with patient's anticoagulation even though she did have some blood in her stool.  Hemoglobin so far is stable.  If she has more hematuria or significant drop in hemoglobin I will stop the anticoagulation.  #3 hypertension: Continue home regimen and monitor patient.  #4 type 2 diabetes: Blood sugar appears controlled now.  We will continue with mainly sliding scale insulin.  Patient states she has been diet controlled.  She had gestational diabetes with elevated blood sugar after bath but that has resolved.  #5 morbid obesity: Counseling will be provided.     DVT prophylaxis: Eliquis  Code Status: Full Code  Family Communication: None available  Disposition Plan: Home  Consults called: None  Admission status: inpatient  Severity of Illness: The appropriate patient status for this patient  is INPATIENT. Inpatient status is judged to be reasonable and necessary in order to provide the required intensity of service to ensure the patient's safety. The patient's presenting symptoms, physical exam findings, and initial radiographic and laboratory data in the context of their chronic comorbidities is felt to place them at high risk for further clinical deterioration. Furthermore, it is not anticipated that the patient will be medically stable for discharge from the hospital within 2 midnights of admission. The following factors support the patient status of inpatient.   " The patient's presenting symptoms include abdominal pain with diarrhea. " The worrisome physical exam findings include abdominal tenderness. " The initial radiographic and laboratory data are worrisome because of CT scan showing possible colitis. " The chronic co-morbidities include diabetes with  chronic anticoagulation.   * I certify that at the point of admission it is my clinical judgment that the patient will require inpatient hospital care spanning beyond 2 midnights from the point of admission due to high intensity of service, high risk for further deterioration and high frequency of surveillance required.Barbette Merino MD [facility] Pager [phone]  If 7PM-7AM, please contact night-coverage [email or web address] Password [email or web address]  07/27/2017, 8:30 PM

## 2017-07-27 NOTE — ED Provider Notes (Signed)
[facility] EMERGENCY DEPARTMENT Provider Note   CSN: [id number] Arrival date & time: 07/27/17  1550     History   Chief Complaint Chief Complaint  Patient presents with  . Abdominal Pain    HPI Virginia Fitzgerald is a 32 y.o. female.  Patient complains of abdominal pain and profuse diarrhea for last 24 hours with blood in her diarrhea  The history is provided by the patient. No language interpreter was used.  Abdominal Pain   This is a new problem. The current episode started 12 to 24 hours ago. The problem occurs constantly. The problem has not changed since onset.The pain is associated with an unknown factor. The pain is located in the generalized abdominal region. The quality of the pain is aching. The pain is at a severity of 4/10. The pain is moderate. Associated symptoms include diarrhea. Pertinent negatives include anorexia, frequency, hematuria and headaches. Nothing aggravates the symptoms.    Past Medical History:  Diagnosis Date  . Anemia   . Bladder infection 03/2017  . Diabetes mellitus without complication ([place])    WITH 2019 PREGNANCY ONLY  . GERD (gastroesophageal reflux disease)    diet controlled - no meds  . Headache    last one 05/25/17 - otc med  . History of blood transfusion 2012   At [facility]  . History of DVT of lower extremity   . History of hiatal hernia   . MRSA infection    not in EPIC - per patient in 2011  . Pulmonary embolism ([place])   . S/P IVC filter   . SVD (spontaneous vaginal delivery)    x 3  . Thrombocytosis [facility])     Patient Active Problem List   Diagnosis Date Noted  . Missed abortion 05/29/2017  . Dysplasia of cervix, low grade (CIN 1) 03/28/2017  . 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 Essential hypertension, benign 03/20/2017  . Recurrent pulmonary embolism ([place]) 03/20/2017  . BMI 50.0-59.9, adult ([place]) 02/28/2017  . Iron deficiency anemia 11/14/2011    Past Surgical History:    Procedure Laterality Date  . DILATION AND EVACUATION N/A 05/29/2017   Procedure: DILATATION AND EVACUATION;  Surgeon: Lavonia Drafts, MD;  Location: [facility];  Service: Gynecology;  Laterality: N/A;  . WISDOM TOOTH EXTRACTION       OB History    Gravida  5   Para  3   Term  3   Preterm  0   AB  2   Living  3     SAB  2   TAB  0   Ectopic  0   Multiple      Live Births  3        Obstetric Comments  Largest prior was 7lbs 7oz. No issues with deliveries.          Home Medications    Prior to Admission medications   Medication Sig Start Date End Date Taking? Authorizing Provider  acetaminophen (TYLENOL) 500 MG tablet Take 1,000 mg by mouth every 6 (six) hours as needed for moderate pain or headache.   Yes [provider]  rivaroxaban (XARELTO) 20 MG TABS tablet Take 1 tablet (20 mg total) by mouth daily with supper. 07/19/17  Yes Nat Christen, MD  ACCU-CHEK FASTCLIX LANCETS MISC 1 Device by Percutaneous route 4 (four) times daily. Patient not taking: Reported on 05/27/2017 03/28/17   Aletha Halim, MD  Blood Glucose Monitoring Suppl (ACCU-CHEK GUIDE) w/Device KIT  1 Device by Does not apply route 4 (four) times daily. Patient not taking: Reported on 05/27/2017 04/10/17   Donnamae Jude, MD  enoxaparin (LOVENOX) 150 MG/ML injection Inject 0.93 mLs (140 mg total) into the skin every 12 (twelve) hours. Patient not taking: Reported on 07/19/2017 03/13/17   Forde Dandy, PharmD  glucose blood (ACCU-CHEK GUIDE) test strip Use as instructed QID Patient not taking: Reported on 05/27/2017 04/10/17   Donnamae Jude, MD    Family History Family History  Problem Relation Age of Onset  . Diabetes Mother   . Hypertension Mother   . Hypertension Maternal Uncle   . Diabetes Maternal Uncle   . Hypertension Maternal Grandmother     Social History Social History   Tobacco Use  . Smoking status: Never Smoker  . Smokeless tobacco: Never Used  . Tobacco  comment: never used tobacco  Substance Use Topics  . Alcohol use: No    Alcohol/week: 0.0 oz  . Drug use: No     Allergies   Patient has no known allergies.   Review of Systems Review of Systems  Constitutional: Negative for appetite change and fatigue.  HENT: Negative for congestion, ear discharge and sinus pressure.   Eyes: Negative for discharge.  Respiratory: Negative for cough.   Cardiovascular: Negative for chest pain.  Gastrointestinal: Positive for abdominal pain and diarrhea. Negative for anorexia.  Genitourinary: Negative for frequency and hematuria.  Musculoskeletal: Negative for back pain.  Skin: Negative for rash.  Neurological: Negative for seizures and headaches.  Psychiatric/Behavioral: Negative for hallucinations.     Physical Exam Updated Vital Signs BP (!) 152/93   Pulse (!) 109   Temp 99.2 F (37.3 C) (Oral)   Resp (!) 21   LMP 07/27/2017   SpO2 97%   Physical Exam  Constitutional: She is oriented to person, place, and time. She appears well-developed.  HENT:  Head: Normocephalic.  Eyes: Conjunctivae and EOM are normal. No scleral icterus.  Neck: Neck supple. No thyromegaly present.  Cardiovascular: Normal rate and regular rhythm. Exam reveals no gallop and no friction rub.  No murmur heard. Pulmonary/Chest: No stridor. She has no wheezes. She has no rales. She exhibits no tenderness.  Abdominal: She exhibits no distension. There is tenderness. There is no rebound.  Musculoskeletal: Normal range of motion. She exhibits no edema.  Lymphadenopathy:    She has no cervical adenopathy.  Neurological: She is oriented to person, place, and time. She exhibits normal muscle tone. Coordination normal.  Skin: No rash noted. No erythema.  Psychiatric: She has a normal mood and affect. Her behavior is normal.     ED Treatments / Results  Labs (all labs ordered are listed, but only abnormal results are displayed) Labs Reviewed  COMPREHENSIVE  METABOLIC PANEL - Abnormal; Notable for the following components:      Result Value   Glucose, Bld 134 (*)    BUN <5 (*)    Calcium 8.5 (*)    Albumin 3.2 (*)    All other components within normal limits  CBC - Abnormal; Notable for the following components:   WBC 13.3 (*)    RBC 5.26 (*)    MCV 77.2 (*)    MCH 23.6 (*)    RDW 16.9 (*)    All other components within normal limits  URINALYSIS, ROUTINE W REFLEX MICROSCOPIC - Abnormal; Notable for the following components:   Color, Urine AMBER (*)    APPearance CLOUDY (*)  Hgb urine dipstick LARGE (*)    Protein, ur 100 (*)    Leukocytes, UA SMALL (*)    RBC / HPF >50 (*)    WBC, UA >50 (*)    All other components within normal limits  LIPASE, BLOOD  I-STAT BETA HCG BLOOD, ED ([facility], [facility], [facility] ONLY)    EKG None  Radiology Ct Abdomen Pelvis W Contrast  Result Date: 07/27/2017 CLINICAL DATA:  Abdominal distention and periumbilical pain EXAM: CT ABDOMEN AND PELVIS WITH CONTRAST TECHNIQUE: Multidetector CT imaging of the abdomen and pelvis was performed using the standard protocol following bolus administration of intravenous contrast. CONTRAST:  197m OMNIPAQUE IOHEXOL 300 MG/ML  SOLN COMPARISON:  11/13/2014 FINDINGS: Lower chest: Top normal heart size without pericardial effusion. Bibasilar dependent atelectasis is noted. Hepatobiliary: Hepatic steatosis without biliary dilatation or mass. Several partially calcified gallstones are noted without secondary signs of acute cholecystitis. Pancreas: No focal mass or ductal dilatation. No peripancreatic inflammation. Spleen: Normal size spleen without mass. Adrenals/Urinary Tract: Adrenal glands are unremarkable. Kidneys are normal, without renal calculi, focal lesion, or hydronephrosis. Bladder is unremarkable. Stomach/Bowel: The stomach is decompressed. There is a small hiatal hernia. Normal duodenal sweep and ligament of Treitz position. No small bowel dilatation or obstruction. Normal appendix  is visualized. Mild-to-moderate diffuse transmural thickening of the colon with subtle pericolonic inflammation is identified along the ascending, transverse and descending colon suspicious for changes of colitis. Vascular/Lymphatic: No significant vascular findings are present. No enlarged abdominal or pelvic lymph nodes. Reproductive: Uterus and bilateral adnexa are unremarkable. Other: Subcutaneous soft tissue induration about the patient's pannus may represent mild panniculitis. Musculoskeletal: No acute or significant osseous findings. IMPRESSION: 1. Mild-to-moderate diffuse thickening of the ascending through descending colon with subtle pericolonic inflammation suggested. Findings raises suspicion for a colitis. 2. Hepatic steatosis with uncomplicated cholelithiasis. 3. Mild soft tissue induration about the patient's pannus may represent a panniculitis Electronically Signed   By: DAshley RoyaltyM.D.   On: 07/27/2017 18:42    Procedures Procedures (including critical care time)  Medications Ordered in ED Medications  ciprofloxacin (CIPRO) IVPB 400 mg (has no administration in time range)  metroNIDAZOLE (FLAGYL) IVPB 500 mg (has no administration in time range)  sodium chloride 0.9 % bolus 1,000 mL (has no administration in time range)  sodium chloride 0.9 % bolus 1,000 mL (0 mLs Intravenous Stopped 07/27/17 1836)  HYDROmorphone (DILAUDID) injection 0.5 mg (0.5 mg Intravenous Given 07/27/17 1735)  iohexol (OMNIPAQUE) 300 MG/ML solution 100 mL (100 mLs Intravenous Contrast Given 07/27/17 1746)     Initial Impression / Assessment and Plan / ED Course  I have reviewed the triage vital signs and the nursing notes.  Pertinent labs & imaging results that were available during my care of the patient were reviewed by me and considered in my medical decision making (see chart for details).     Patient with colitis and possible panniculitis.  She will be admitted to medicine and placed on  antibiotics  Final Clinical Impressions(s) / ED Diagnoses   Final diagnoses:  None    ED Discharge Orders    None       ZMilton Ferguson MD 07/27/17 2024

## 2017-07-27 NOTE — ED Triage Notes (Addendum)
Pt presents to ED for assessment of abdominal pain since last night.  Pain started while patient was relaxing at home.  States worsening today.  Patient started her cycle Thursday.  States this does not feel like her normal cramps.  Pain is umbilical in nature.  Patient tachycardic in triage.  Pt states sharp pains x 10 minutes to 1 hour, with breaks in between.  Tylenol at home not helping.  Pt is currently on blood thinners for a clot in her abdomen and in her lungs in 2012.  States pain is similar.

## 2017-07-28 DIAGNOSIS — K529 Noninfective gastroenteritis and colitis, unspecified: Secondary | ICD-10-CM | POA: Diagnosis not present

## 2017-07-28 DIAGNOSIS — E119 Type 2 diabetes mellitus without complications: Secondary | ICD-10-CM | POA: Diagnosis not present

## 2017-07-28 LAB — GLUCOSE, CAPILLARY
GLUCOSE-CAPILLARY: 170 mg/dL — AB (ref 70–99)
GLUCOSE-CAPILLARY: 101 mg/dL — AB (ref 70–99)
GLUCOSE-CAPILLARY: 137 mg/dL — AB (ref 70–99)
GLUCOSE-CAPILLARY: 138 mg/dL — AB (ref 70–99)

## 2017-07-28 LAB — COMPREHENSIVE METABOLIC PANEL
ALBUMIN: 2.8 g/dL — AB (ref 3.5–5.0)
ALT: 26 U/L (ref 0–44)
ANION GAP: 7 (ref 5–15)
AST: 20 U/L (ref 15–41)
Alkaline Phosphatase: 76 U/L (ref 38–126)
BILIRUBIN TOTAL: 0.3 mg/dL (ref 0.3–1.2)
BUN: 5 mg/dL — ABNORMAL LOW (ref 6–20)
CO2: 25 mmol/L (ref 22–32)
Calcium: 8.3 mg/dL — ABNORMAL LOW (ref 8.9–10.3)
Chloride: 106 mmol/L (ref 98–111)
Creatinine, Ser: 0.72 mg/dL (ref 0.44–1.00)
GFR calc Af Amer: >60 mL/min (ref >60)
GFR calc non Af Amer: >60 mL/min (ref >60)
GLUCOSE: 135 mg/dL — AB (ref 70–99)
POTASSIUM: 3.4 mmol/L — AB (ref 3.5–5.1)
Sodium: 138 mmol/L (ref 135–145)
TOTAL PROTEIN: 6 g/dL — AB (ref 6.5–8.1)

## 2017-07-28 LAB — CBC
HEMATOCRIT: 37.7 % (ref 36.0–46.0)
Hemoglobin: 11.4 g/dL — ABNORMAL LOW (ref 12.0–15.0)
MCH: 23.6 pg — ABNORMAL LOW (ref 26.0–34.0)
MCHC: 30.2 g/dL (ref 30.0–36.0)
MCV: 78.1 fL (ref 78.0–100.0)
Platelets: 324 10*3/uL (ref 150–400)
RBC: 4.83 MIL/uL (ref 3.87–5.11)
RDW: 17 % — AB (ref 11.5–15.5)
WBC: 8.2 10*3/uL (ref 4.0–10.5)

## 2017-07-28 LAB — GASTROINTESTINAL PANEL BY PCR, STOOL (REPLACES STOOL CULTURE)
ADENOVIRUS F40/41: NOT DETECTED
ASTROVIRUS: NOT DETECTED
CYCLOSPORA CAYETANENSIS: NOT DETECTED
Campylobacter species: DETECTED — AB
Cryptosporidium: NOT DETECTED
ENTAMOEBA HISTOLYTICA: NOT DETECTED
ENTEROPATHOGENIC E COLI (EPEC): DETECTED — AB
ENTEROTOXIGENIC E COLI (ETEC): NOT DETECTED
Enteroaggregative E coli (EAEC): NOT DETECTED
GIARDIA LAMBLIA: NOT DETECTED
Norovirus GI/GII: NOT DETECTED
Plesimonas shigelloides: NOT DETECTED
Rotavirus A: NOT DETECTED
SAPOVIRUS (I, II, IV, AND V): NOT DETECTED
Salmonella species: NOT DETECTED
Shiga like toxin producing E coli (STEC): NOT DETECTED
Shigella/Enteroinvasive E coli (EIEC): NOT DETECTED
VIBRIO SPECIES: NOT DETECTED
Vibrio cholerae: NOT DETECTED
Yersinia enterocolitica: NOT DETECTED

## 2017-07-28 LAB — C DIFFICILE QUICK SCREEN W PCR REFLEX
C DIFFICLE (CDIFF) ANTIGEN: NEGATIVE
C Diff interpretation: NOT DETECTED
C Diff toxin: NEGATIVE

## 2017-07-28 MED ORDER — CIPROFLOXACIN IN D5W 400 MG/200ML IV SOLN
400.0000 mg | Freq: Two times a day (BID) | INTRAVENOUS | Status: DC
  Administered 2017-07-28 – 2017-07-29 (×3): 400 mg via INTRAVENOUS
  Filled 2017-07-28 (×4): qty 200

## 2017-07-28 MED ORDER — METRONIDAZOLE IN NACL 5-0.79 MG/ML-% IV SOLN
500.0000 mg | Freq: Three times a day (TID) | INTRAVENOUS | Status: DC
  Administered 2017-07-28 – 2017-07-29 (×4): 500 mg via INTRAVENOUS
  Filled 2017-07-28 (×6): qty 100

## 2017-07-28 MED ORDER — CIPROFLOXACIN HCL 500 MG PO TABS: 500.0000 mg | ORAL_TABLET | Freq: Two times a day (BID) | ORAL | 0 refills | Status: DC

## 2017-07-28 MED ORDER — HYDROMORPHONE HCL 1 MG/ML IJ SOLN
0.5000 mg | Freq: Once | INTRAMUSCULAR | Status: AC
  Administered 2017-07-28: 0.5 mg via INTRAVENOUS
  Filled 2017-07-28: qty 1

## 2017-07-28 MED ORDER — POTASSIUM CHLORIDE IN NACL 20-0.9 MEQ/L-% IV SOLN
INTRAVENOUS | Status: DC
  Administered 2017-07-28 – 2017-07-29 (×2): via INTRAVENOUS
  Filled 2017-07-28 (×3): qty 1000

## 2017-07-28 MED ORDER — METRONIDAZOLE 500 MG PO TABS: 500.0000 mg | ORAL_TABLET | Freq: Three times a day (TID) | ORAL | 0 refills | Status: DC

## 2017-07-28 NOTE — Progress Notes (Signed)
Patient ID: [name], female   DOB: 1985/09/07, 32 y.o.   MRN: [id number]                                                                PROGRESS NOTE                                                                                                                                                                                                             Patient Demographics:    Virginia Fitzgerald, is a 32 y.o. female, DOB - Jun 09, 1985, JAS:[id number]  Admit date - 07/27/2017   Admitting Physician Elwyn Reach, MD  Outpatient Primary MD for the patient is Patient, No Pcp Per  LOS - 1  Outpatient Specialists:     Chief Complaint  Patient presents with  . Abdominal Pain       Brief Narrative  32 y.o. female with medical history significant of diabetes which is diet controlled, recurrent pulmonary embolism with patient having 3 episodes so far.  Currently on chronic anticoagulation, history of previous MRSA infection, morbid obesity who is an Agricultural consultant at the [employer] and has had exposure to some chemicals she believes.  Patient has been having nausea vomiting with abdominal pain.  She also noted bloody diarrhea today.  Associated with abdominal cramping.  Patient came to the ER where she was evaluated.  She is not sure if the blood she is so in her stool is coming from her stool or from her ongoing menstrual period.  ED Course: Temperature is 98.7 blood pressures 150/102 with a pulse 109 respiratory of 12 oxygen sats 98% on room air.  White count is 13.3 with hemoglobin 12.4 platelets 357.  Sodium 134.  Urinalysis showed hematuria but again patient is having her periods.  CT abdomen and pelvis showed Mild-to-moderate diffuse thickening of the ascending through descending colon with subtle pericolonic inflammation suggested. Findings raises suspicion for a colitis. Also, Hepatic steatosis with uncomplicated cholelithiasis. Mild soft tissue induration about the patient's pannus  may represent a panniculitis      Subjective:    Virginia Fitzgerald today has still some slight abdominal discomfort.  But this has gotten better. Afebrile overnite.    No headache, No chest pain, No Nausea, No new weakness tingling or numbness, No Cough -  SOB.    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   Cipro, Flagyl iv pharmacy to dose  #2 recurrent DVTs: With 3 episode of DVTs I will continue with patient's anticoagulation even though she did have some blood in her stool.  Hemoglobin so far is stable.  If she has more hematuria or significant drop in hemoglobin consider stopping anticoagulation.  #3 hypertension: Continue home regimen and monitor patient.  #4 type 2 diabetes: Blood sugar appears controlled now.  We will continue with mainly sliding scale insulin.  Patient states she has been diet controlled.  She had gestational diabetes with elevated blood sugar after bath but that has resolved.  #5 morbid obesity: Counseling will be provided.         Code Status :  FULL CODE  Family Communication  : w patient  Disposition Plan  :  ? Home tomorrow if continues to improve  Barriers For Discharge :   Consults  :  none  Procedures  :   DVT Prophylaxis  :  Eliquis  Lab Results  Component Value Date   PLT 324 07/28/2017    Antibiotics  :  Cipro/ flagyl 6/29=>  Anti-infectives (From admission, onward)   Start     Dose/Rate Route Frequency Ordered Stop   07/28/17 0900  ciprofloxacin (CIPRO) IVPB 400 mg     400 mg 200 mL/hr over 60 Minutes Intravenous Every 12 hours 07/28/17 0756     07/28/17 0800  metroNIDAZOLE (FLAGYL) IVPB 500 mg     500 mg 100 mL/hr over 60 Minutes Intravenous Every 8 hours 07/28/17 0748     07/27/17 2015   ciprofloxacin (CIPRO) IVPB 400 mg     400 mg 200 mL/hr over 60 Minutes Intravenous  Once 07/27/17 2000 07/28/17 0008   07/27/17 2015  metroNIDAZOLE (FLAGYL) IVPB 500 mg     500 mg 100 mL/hr over 60 Minutes Intravenous  Once 07/27/17 2000 07/28/17 0115        Objective:   Vitals:   07/27/17 2124 07/28/17 0005 07/28/17 0536 07/28/17 0905  BP: (!) 150/102 135/66 116/68   Pulse: (!) 109 (!) 110 89   Resp: 12  16   Temp: 98.7 F (37.1 C)     TempSrc: Oral     SpO2: 98%  99%   Weight:    (!) 138.6 kg (305 lb 8.9 oz)    Wt Readings from Last 3 Encounters:  07/28/17 (!) 138.6 kg (305 lb 8.9 oz)  05/28/17 (!) 142.9 kg (315 lb)  05/26/17 (!) 143.4 kg (316 lb 1.9 oz)     Intake/Output Summary (Last 24 hours) at 07/28/2017 1444 Last data filed at 07/28/2017 0600 Gross per 24 hour  Intake 2016.51 ml  Output -  Net 2016.51 ml     Physical Exam  Awake Alert, Oriented X 3, No new F.N deficits, Normal affect [place].AT,PERRAL Supple Neck,No JVD, No cervical lymphadenopathy appriciated.  Symmetrical Chest wall movement, Good air movement bilaterally, CTAB RRR,No Gallops,Rubs or new Murmurs, No Parasternal Heave +ve B.Sounds, Abd Soft, No tenderness, No organomegaly appriciated, No rebound - guarding or rigidity. No Cyanosis, Clubbing or edema, No new Rash or bruise   Morbid obeisity    Data Review:  CBC Recent Labs  Lab 07/27/17 1611 07/28/17 0832  WBC 13.3* 8.2  HGB 12.4 11.4*  HCT 40.6 37.7  PLT 357 324  MCV 77.2* 78.1  MCH 23.6* 23.6*  MCHC 30.5 30.2  RDW 16.9* 17.0*    Chemistries  Recent Labs  Lab 07/27/17 1611 07/28/17 0832  NA 138 138  K 3.6 3.4*  CL 104 106  CO2 26 25  GLUCOSE 134* 135*  BUN <5* 5*  CREATININE 0.81 0.72  CALCIUM 8.5* 8.3*  AST 23 20  ALT 33 26  ALKPHOS 86 76  BILITOT 0.4 0.3   ------------------------------------------------------------------------------------------------------------------ No results for input(s): CHOL, HDL,  LDLCALC, TRIG, CHOLHDL, LDLDIRECT in the last 72 hours.  Lab Results  Component Value Date   HGBA1C 6.5 (H) 03/20/2017   ------------------------------------------------------------------------------------------------------------------ No results for input(s): TSH, T4TOTAL, T3FREE, THYROIDAB in the last 72 hours.  Invalid input(s): FREET3 ------------------------------------------------------------------------------------------------------------------ No results for input(s): VITAMINB12, FOLATE, FERRITIN, TIBC, IRON, RETICCTPCT in the last 72 hours.  Coagulation profile No results for input(s): INR, PROTIME in the last 168 hours.  No results for input(s): DDIMER in the last 72 hours.  Cardiac Enzymes No results for input(s): CKMB, TROPONINI, MYOGLOBIN in the last 168 hours.  Invalid input(s): CK ------------------------------------------------------------------------------------------------------------------ No results found for: BNP  Inpatient Medications  Scheduled Meds: . insulin aspart  0-9 Units Subcutaneous TID WC  . rivaroxaban  20 mg Oral Q supper   Continuous Infusions: . sodium chloride 125 mL/hr at 07/27/17 2152  . ciprofloxacin 400 mg (07/28/17 1035)  . metronidazole 500 mg (07/28/17 0916)   PRN Meds:.acetaminophen, ondansetron **OR** ondansetron (ZOFRAN) IV  Micro Results Recent Results (from the past 240 hour(s))  C difficile quick scan w PCR reflex     Status: None   Collection Time: 07/27/17  8:57 PM  Result Value Ref Range Status   C Diff antigen NEGATIVE NEGATIVE Final   C Diff toxin NEGATIVE NEGATIVE Final   C Diff interpretation No C. difficile detected.  Final    Comment: Performed at [facility], [place] [address], [place], [place] [address]    Radiology Reports Ct Angio Chest Pe W And/or Wo Contrast  Result Date: 07/19/2017 CLINICAL DATA:  Two week history of pain in the right leg, swelling in the feet. History of DVTs. Upper abdominal  pain since May after having a miscarriage. EXAM: CT ANGIOGRAPHY CHEST WITH CONTRAST TECHNIQUE: Multidetector CT imaging of the chest was performed using the standard protocol during bolus administration of intravenous contrast. Multiplanar CT image reconstructions and MIPs were obtained to evaluate the vascular anatomy. CONTRAST:  42mL ISOVUE-370 IOPAMIDOL (ISOVUE-370) INJECTION 76% COMPARISON:  02/24/2017 FINDINGS: Cardiovascular: Examination is technically limited due to body habitus and technique. There is moderately good opacification of the central and segmental pulmonary arteries. No filling defects are identified. No evidence of significant pulmonary embolus. Small peripheral emboli could be obscured due to technique. Normal caliber thoracic aorta. Normal heart size. No pericardial effusion. Mediastinum/Nodes: No significant lymphadenopathy in the chest. Small esophageal hiatal hernia. Esophagus is decompressed. Lungs/Pleura: Motion artifact limits examination. There is dependent atelectasis in the lung bases. No consolidation or edema. Airways are patent. No pneumothorax. No pleural effusions. Upper Abdomen: Diffuse fatty infiltration of the liver. Musculoskeletal: No chest wall abnormality. No acute or significant osseous findings. Review of the MIP images confirms the above findings. IMPRESSION: 1. Technically limited study but no evidence of significant central pulmonary embolus. 2. No evidence of active pulmonary disease. 3. Small esophageal  hiatal hernia. 4. Diffuse fatty infiltration of the liver. Electronically Signed   By: Lucienne Capers M.D.   On: 07/19/2017 05:49   Ct Abdomen Pelvis W Contrast  Result Date: 07/27/2017 CLINICAL DATA:  Abdominal distention and periumbilical pain EXAM: CT ABDOMEN AND PELVIS WITH CONTRAST TECHNIQUE: Multidetector CT imaging of the abdomen and pelvis was performed using the standard protocol following bolus administration of intravenous contrast. CONTRAST:  111mL  OMNIPAQUE IOHEXOL 300 MG/ML  SOLN COMPARISON:  11/13/2014 FINDINGS: Lower chest: Top normal heart size without pericardial effusion. Bibasilar dependent atelectasis is noted. Hepatobiliary: Hepatic steatosis without biliary dilatation or mass. Several partially calcified gallstones are noted without secondary signs of acute cholecystitis. Pancreas: No focal mass or ductal dilatation. No peripancreatic inflammation. Spleen: Normal size spleen without mass. Adrenals/Urinary Tract: Adrenal glands are unremarkable. Kidneys are normal, without renal calculi, focal lesion, or hydronephrosis. Bladder is unremarkable. Stomach/Bowel: The stomach is decompressed. There is a small hiatal hernia. Normal duodenal sweep and ligament of Treitz position. No small bowel dilatation or obstruction. Normal appendix is visualized. Mild-to-moderate diffuse transmural thickening of the colon with subtle pericolonic inflammation is identified along the ascending, transverse and descending colon suspicious for changes of colitis. Vascular/Lymphatic: No significant vascular findings are present. No enlarged abdominal or pelvic lymph nodes. Reproductive: Uterus and bilateral adnexa are unremarkable. Other: Subcutaneous soft tissue induration about the patient's pannus may represent mild panniculitis. Musculoskeletal: No acute or significant osseous findings. IMPRESSION: 1. Mild-to-moderate diffuse thickening of the ascending through descending colon with subtle pericolonic inflammation suggested. Findings raises suspicion for a colitis. 2. Hepatic steatosis with uncomplicated cholelithiasis. 3. Mild soft tissue induration about the patient's pannus may represent a panniculitis Electronically Signed   By: Ashley Royalty M.D.   On: 07/27/2017 18:42    Time Spent in minutes  30   Jani Gravel M.D on 07/28/2017 at 2:44 PM  Between 7am to 7pm - Pager - [phone]    After 7pm go to [email or web address] - password [facility]  [facility] -  Office   [phone]

## 2017-07-28 NOTE — Progress Notes (Signed)
2115 received pt from ED complaining of pain abdomen 7/10/ A&O x 4

## 2017-07-28 NOTE — Progress Notes (Signed)
Pharmacy Antibiotic Note  Virginia Fitzgerald is a 32 y.o. female admitted on 07/27/2017 with abdominal pain, nausea and vomiting, found to have acute colitis.  Pharmacy has been consulted for Cipro dosing.  Patient is also on Flagyl.  SCr 0.81, nCrCL 113 ml/min, afebrile WBC 13.3, QTc 444ms.   Plan: Cipro 400mg  IV Q12H Flagyl 500mg  IV Q8H per MD Pharmacy will sign off and follow peripherally.  Thank you for the consult!     Temp (24hrs), Avg:99 F (37.2 C), Min:98.7 F (37.1 C), Max:99.2 F (37.3 C)  Recent Labs  Lab 07/27/17 1611  WBC 13.3*  CREATININE 0.81    CrCl cannot be calculated (Unknown ideal weight.).    No Known Allergies   Cipro 6/29 >> Flagyl 3/30 >>  6/29 GI panel -  6/29 C.diff -   Virginia Babe D. Mina Fitzgerald, PharmD, BCPS, BCCCP Pager:  [phone] 07/28/2017, 7:55 AM

## 2017-07-28 NOTE — Discharge Instructions (Signed)

## 2017-07-28 NOTE — Plan of Care (Signed)
  Problem: Activity: Goal: Risk for activity intolerance will decrease Outcome: Progressing   Problem: Nutrition: Goal: Adequate nutrition will be maintained Outcome: Progressing   

## 2017-07-29 DIAGNOSIS — K529 Noninfective gastroenteritis and colitis, unspecified: Secondary | ICD-10-CM | POA: Diagnosis not present

## 2017-07-29 LAB — COMPREHENSIVE METABOLIC PANEL
ALK PHOS: 72 U/L (ref 38–126)
ALT: 25 U/L (ref 0–44)
ANION GAP: 13 (ref 5–15)
AST: 18 U/L (ref 15–41)
Albumin: 3 g/dL — ABNORMAL LOW (ref 3.5–5.0)
BUN: <5 mg/dL — ABNORMAL LOW (ref 6–20)
CALCIUM: 8.8 mg/dL — AB (ref 8.9–10.3)
CO2: 24 mmol/L (ref 22–32)
CREATININE: 0.67 mg/dL (ref 0.44–1.00)
Chloride: 104 mmol/L (ref 98–111)
Glucose, Bld: 128 mg/dL — ABNORMAL HIGH (ref 70–99)
Potassium: 3.9 mmol/L (ref 3.5–5.1)
Sodium: 141 mmol/L (ref 135–145)
Total Bilirubin: <0.1 mg/dL — ABNORMAL LOW (ref 0.3–1.2)
Total Protein: 6.5 g/dL (ref 6.5–8.1)

## 2017-07-29 LAB — CBC
HCT: 37.9 % (ref 36.0–46.0)
HEMOGLOBIN: 11.7 g/dL — AB (ref 12.0–15.0)
MCH: 23.6 pg — AB (ref 26.0–34.0)
MCHC: 30.9 g/dL (ref 30.0–36.0)
MCV: 76.6 fL — AB (ref 78.0–100.0)
PLATELETS: 332 10*3/uL (ref 150–400)
RBC: 4.95 MIL/uL (ref 3.87–5.11)
RDW: 16.8 % — ABNORMAL HIGH (ref 11.5–15.5)
WBC: 8.2 10*3/uL (ref 4.0–10.5)

## 2017-07-29 LAB — GLUCOSE, CAPILLARY
GLUCOSE-CAPILLARY: 125 mg/dL — AB (ref 70–99)
GLUCOSE-CAPILLARY: 113 mg/dL — AB (ref 70–99)
Glucose-Capillary: 113 mg/dL — ABNORMAL HIGH (ref 70–99)
Glucose-Capillary: 82 mg/dL (ref 70–99)

## 2017-07-29 MED ORDER — PRO-STAT 64 PO LIQD: 30.0000 mL | Freq: Two times a day (BID) | ORAL | 0 refills | Status: DC

## 2017-07-29 MED ORDER — CIPROFLOXACIN HCL 500 MG PO TABS: 500.0000 mg | ORAL_TABLET | Freq: Two times a day (BID) | ORAL | 0 refills | Status: AC

## 2017-07-29 MED ORDER — METRONIDAZOLE 500 MG PO TABS: 500.0000 mg | ORAL_TABLET | Freq: Three times a day (TID) | ORAL | 0 refills | Status: AC

## 2017-07-29 NOTE — Progress Notes (Signed)
Patient given discharge instructions, patient verbalized understanding a d aware of follow up appointments. Patient left unit in stable condition with nursing staff via wheelchair.

## 2017-07-29 NOTE — Discharge Summary (Addendum)
Virginia Fitzgerald, is a 32 y.o. female  DOB August 30, 1985  MRN [id number].  Admission date:  07/27/2017  Admitting Physician  Elwyn Reach, MD  Discharge Date:  07/29/2017   Primary MD  Patient, No Pcp Per  Recommendations for primary care physician for things to follow:    Acute colitis/ Anemia Enteropathogenic E. Coli, Campylobacter  Cont cipro 54m po bid x 6 days Cont Flagyl 5040mpo tid x 6 days Check cbc , cmp in 1 week F/u with pcp in 1 week  Recurrent DVTs:With 3 episode of DVTs  Cont Xarelto  Hypertension: Resume home bp medicationsregimen and monitor patient.  Type 2 diabetes:Blood sugar appears controlled now. We will continue with mainly sliding scale insulin. Patient states she has been diet controlled. She had gestational diabetes with elevated blood sugar after bath but that has resolved.  Morbid obesity:Counseling will be provided.   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 Anemia   . Bladder infection 03/2017  . [place] ([place]   WITH 2019 PREGNANCY ONLY  . GERD (gastroesophageal reflux disease)    diet controlled - no meds  . Headache    last one 05/25/17 - otc med  . [facility]   [facility] History of DVT of lower extremity   . History of hiatal hernia   . MRSA infection    not in EPIC - per patient in 2011  . Pulmonary embolism ([facility]  . S/P IVC filter   . SVD (spontaneous vaginal delivery)    x 3  . Thrombocytosis ([facility]    Past Surgical  History:  Procedure Laterality Date  . DILATION AND EVACUATION N/A 05/29/2017   Procedure: DILATATION AND EVACUATION;  Surgeon: HaLavonia DraftsMD;  [place] [facility];  Service: Gynecology;  Laterality: N/A;  . WISDOM TOOTH EXTRACTION         HPI  from the history and physical done on the day of admission:      [age].o. female with medical history significant of diabetes which is diet controlled, recurrent pulmonary embolism with patient having 3 episodes so far.  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  Patient has been having nausea vomiting with abdominal pain.  She also noted bloody diarrhea today.  Associated with abdominal cramping.  Patient came to the ER where she was evaluated.  She is not sure if the blood she is so in  her stool is coming from her stool or from her ongoing menstrual period.  ED Course: Temperature is 98.7 blood pressures 150/102 with a pulse 109 respiratory of 12 oxygen sats 98% on room air.  White count is 13.3 with hemoglobin 12.4 platelets 357.  Sodium 134.  Urinalysis showed hematuria but again patient is having her periods.  CT abdomen and pelvis showed Mild-to-moderate diffuse thickening of the ascending through descending colon with subtle pericolonic inflammation suggested. Findings raises suspicion for a colitis. Also, Hepatic steatosis with uncomplicated cholelithiasis. Mild soft tissue induration about the patient's pannus may represent a panniculitis     Hospital Course:     Pt was admitted and hydrated with ns iv, and her hypokalemia was repleted. Pt stated that the day after admission that her abdominal discomfort had improved signficantly with the cipro and flagyl and was initiated in the ED.  Pt feeling better clinically today.  Her wbc wnl.  Afebrile for the past 48 hours. Pt was noted to have severe  protein calorie malnutrition. Pt feels much better and will be discharged to Home.    Follow UP  Follow-up Information    Sloan Leiter, MD Follow up in 1 week(s).   Specialty:  Obstetrics and Gynecology Contact information: [place] [address] [phone]            Consults obtained - none  Discharge Condition: stabe  Diet and Activity recommendation: See Discharge Instructions below  Discharge Instructions      Discharge Medications     Allergies as of 07/29/2017   No Known Allergies     Medication List    STOP taking these medications   enoxaparin 150 MG/ML injection Commonly known as:  LOVENOX     TAKE these medications   ACCU-CHEK FASTCLIX LANCETS Misc 1 Device by Percutaneous route 4 (four) times daily.   ACCU-CHEK GUIDE w/Device Kit 1 Device by Does not apply route 4 (four) times daily.   acetaminophen 500 MG tablet Commonly known as:  TYLENOL Take 1,000 mg by mouth every 6 (six) hours as needed for moderate pain or headache.   ciprofloxacin 500 MG tablet Commonly known as:  CIPRO Take 1 tablet (500 mg total) by mouth 2 (two) times daily for 6 days.   feeding supplement (PRO-STAT 64) Liqd Take 30 mLs by mouth 2 (two) times daily.   glucose blood test strip Commonly known as:  ACCU-CHEK GUIDE Use as instructed QID   metroNIDAZOLE 500 MG tablet Commonly known as:  FLAGYL Take 1 tablet (500 mg total) by mouth 3 (three) times daily for 6 days.   rivaroxaban 20 MG Tabs tablet Commonly known as:  XARELTO Take 1 tablet (20 mg total) by mouth daily with supper.       Major procedures and Radiology Reports - PLEASE review detailed and final reports for all details, in brief -       Ct Angio Chest Pe W And/or Wo Contrast  Result Date: 07/19/2017 CLINICAL DATA:  Two week history of pain in the right leg, swelling in the feet. History of DVTs. Upper abdominal pain since May after having a miscarriage. EXAM: CT  ANGIOGRAPHY CHEST WITH CONTRAST TECHNIQUE: Multidetector CT imaging of the chest was performed using the standard protocol during bolus administration of intravenous contrast. Multiplanar CT image reconstructions and MIPs were obtained to evaluate the vascular anatomy. CONTRAST:  24m ISOVUE-370 IOPAMIDOL (ISOVUE-370) INJECTION 76% COMPARISON:  02/24/2017 FINDINGS: Cardiovascular: Examination is technically limited due  to body habitus and technique. There is moderately good opacification of the central and segmental pulmonary arteries. No filling defects are identified. No evidence of significant pulmonary embolus. Small peripheral emboli could be obscured due to technique. Normal caliber thoracic aorta. Normal heart size. No pericardial effusion. Mediastinum/Nodes: No significant lymphadenopathy in the chest. Small esophageal hiatal hernia. Esophagus is decompressed. Lungs/Pleura: Motion artifact limits examination. There is dependent atelectasis in the lung bases. No consolidation or edema. Airways are patent. No pneumothorax. No pleural effusions. Upper Abdomen: Diffuse fatty infiltration of the liver. Musculoskeletal: No chest wall abnormality. No acute or significant osseous findings. Review of the MIP images confirms the above findings. IMPRESSION: 1. Technically limited study but no evidence of significant central pulmonary embolus. 2. No evidence of active pulmonary disease. 3. Small esophageal hiatal hernia. 4. Diffuse fatty infiltration of the liver. Electronically Signed   By: Lucienne Capers M.D.   On: 07/19/2017 05:49   Ct Abdomen Pelvis W Contrast  Result Date: 07/27/2017 CLINICAL DATA:  Abdominal distention and periumbilical pain EXAM: CT ABDOMEN AND PELVIS WITH CONTRAST TECHNIQUE: Multidetector CT imaging of the abdomen and pelvis was performed using the standard protocol following bolus administration of intravenous contrast. CONTRAST:  166m OMNIPAQUE IOHEXOL 300 MG/ML  SOLN COMPARISON:   11/13/2014 FINDINGS: Lower chest: Top normal heart size without pericardial effusion. Bibasilar dependent atelectasis is noted. Hepatobiliary: Hepatic steatosis without biliary dilatation or mass. Several partially calcified gallstones are noted without secondary signs of acute cholecystitis. Pancreas: No focal mass or ductal dilatation. No peripancreatic inflammation. Spleen: Normal size spleen without mass. Adrenals/Urinary Tract: Adrenal glands are unremarkable. Kidneys are normal, without renal calculi, focal lesion, or hydronephrosis. Bladder is unremarkable. Stomach/Bowel: The stomach is decompressed. There is a small hiatal hernia. Normal duodenal sweep and ligament of Treitz position. No small bowel dilatation or obstruction. Normal appendix is visualized. Mild-to-moderate diffuse transmural thickening of the colon with subtle pericolonic inflammation is identified along the ascending, transverse and descending colon suspicious for changes of colitis. Vascular/Lymphatic: No significant vascular findings are present. No enlarged abdominal or pelvic lymph nodes. Reproductive: Uterus and bilateral adnexa are unremarkable. Other: Subcutaneous soft tissue induration about the patient's pannus may represent mild panniculitis. Musculoskeletal: No acute or significant osseous findings. IMPRESSION: 1. Mild-to-moderate diffuse thickening of the ascending through descending colon with subtle pericolonic inflammation suggested. Findings raises suspicion for a colitis. 2. Hepatic steatosis with uncomplicated cholelithiasis. 3. Mild soft tissue induration about the patient's pannus may represent a panniculitis Electronically Signed   By: DAshley RoyaltyM.D.   On: 07/27/2017 18:42    Micro Results    Recent Results (from the past 240 hour(s))  C difficile quick scan w PCR reflex     Status: None   Collection Time: 07/27/17  8:57 PM  Result Value Ref Range Status   C Diff antigen NEGATIVE NEGATIVE Final   C Diff  toxin NEGATIVE NEGATIVE Final   C Diff interpretation No C. difficile detected.  Final    Comment: Performed at [facility] [address], [place] [address] Gastrointestinal Panel by PCR , Stool     Status: Abnormal   Collection Time: 07/27/17  8:57 PM  Result Value Ref Range Status   Campylobacter species DETECTED (A) NOT DETECTED Final    Comment: RESULT CALLED TO, READ BACK BY AND VERIFIED WITH: DOMINIQUE LEWIS 07/28/17 @ 1540  MPueblitos   Plesimonas shigelloides NOT DETECTED NOT DETECTED Final   Salmonella species NOT DETECTED  NOT DETECTED Final   Yersinia enterocolitica NOT DETECTED NOT DETECTED Final   Vibrio species NOT DETECTED NOT DETECTED Final   Vibrio cholerae NOT DETECTED NOT DETECTED Final   Enteroaggregative E coli (EAEC) NOT DETECTED NOT DETECTED Final   Enteropathogenic E coli (EPEC) DETECTED (A) NOT DETECTED Final    Comment: RESULT CALLED TO, READ BACK BY AND VERIFIED WITH: DOMINIQUE LEWIS 07/28/17 @ 1540  [place] E coli (ETEC) NOT DETECTED NOT DETECTED Final   Shiga like toxin producing E coli (STEC) NOT DETECTED NOT DETECTED Final   Shigella/Enteroinvasive E coli (EIEC) NOT DETECTED NOT DETECTED Final   Cryptosporidium NOT DETECTED NOT DETECTED Final   Cyclospora cayetanensis NOT DETECTED NOT DETECTED Final   Entamoeba histolytica NOT DETECTED NOT DETECTED Final   Giardia lamblia NOT DETECTED NOT DETECTED Final   Adenovirus F40/41 NOT DETECTED NOT DETECTED Final   Astrovirus NOT DETECTED NOT DETECTED Final   Norovirus GI/GII NOT DETECTED NOT DETECTED Final   Rotavirus A NOT DETECTED NOT DETECTED Final   Sapovirus (I, II, IV, and V) NOT DETECTED NOT DETECTED Final    Comment: Performed at [facility], [address] [place], [place] [address]       Today   Subjective    Robi Corwin today states that diarrhea stopped. Tolerating regular diet.  No headache,no chest, no  abdominal pain,no n/v, no brbpr.  no new  weakness tingling or numbness, feels much better wants to go home today.    Objective   Blood pressure (!) 134/96, pulse 83, temperature 98.3 F (36.8 C), temperature source Oral, resp. rate 20, weight (!) 138.6 kg (305 lb 8.9 oz), last menstrual period 07/27/2017, SpO2 99 %, unknown if currently breastfeeding.   Intake/Output Summary (Last 24 hours) at 07/29/2017 1743 Last data filed at 07/28/2017 2051 Gross per 24 hour  Intake 200 ml  Output -  Net 200 ml    Exam Awake Alert, Oriented x 3, No new F.N deficits, Normal affect [place].AT,PERRAL Supple Neck,No JVD, No cervical lymphadenopathy appriciated.  Symmetrical Chest wall movement, Good air movement bilaterally, CTAB RRR,No Gallops,Rubs or new Murmurs, No Parasternal Heave +ve B.Sounds, Abd Soft, Non tender, No organomegaly appriciated, No rebound -guarding or rigidity. No Cyanosis, Clubbing or edema, No new Rash or bruise   Data Review   CBC w Diff:  Lab Results  Component Value Date   WBC 8.2 07/29/2017   HGB 11.7 (L) 07/29/2017   HGB 11.9 03/20/2017   HGB 12.9 09/10/2014   HCT 37.9 07/29/2017   HCT 36.3 03/20/2017   HCT 39.1 09/10/2014   PLT 332 07/29/2017   PLT 372 03/20/2017   LYMPHOPCT 19.7 09/10/2014   MONOPCT 5.9 09/10/2014   EOSPCT 1.4 09/10/2014   BASOPCT 0.3 09/10/2014    CMP:  Lab Results  Component Value Date   NA 141 07/29/2017   NA 136 03/20/2017   K 3.9 07/29/2017   CL 104 07/29/2017   CO2 24 07/29/2017   BUN <5 (L) 07/29/2017   BUN 8 03/20/2017   CREATININE 0.67 07/29/2017   PROT 6.5 07/29/2017   PROT 7.1 03/20/2017   ALBUMIN 3.0 (L) 07/29/2017   ALBUMIN 4.1 03/20/2017   BILITOT <0.1 (L) 07/29/2017   BILITOT <0.2 03/20/2017   ALKPHOS 72 07/29/2017   AST 18 07/29/2017   ALT 25 07/29/2017  .   Total Time in preparing paper work, data evaluation and todays exam - 7 minutes  Jani Gravel M.D on  07/29/2017 at 5:43 PM  [facility]   Office  [phone]

## 2017-10-01 ENCOUNTER — Encounter (HOSPITAL_COMMUNITY): Payer: Self-pay | Admitting: *Deleted

## 2017-10-01 ENCOUNTER — Emergency Department (HOSPITAL_COMMUNITY)
Admission: EM | Admit: 2017-10-01 | Discharge: 2017-10-01 | Disposition: A | Discharge status: Home / Self Care | Payer: Medicaid Other | Attending: Emergency Medicine | Admitting: Emergency Medicine

## 2017-10-01 DIAGNOSIS — I10 Essential (primary) hypertension: Secondary | ICD-10-CM | POA: Diagnosis not present

## 2017-10-01 DIAGNOSIS — Z7901 Long term (current) use of anticoagulants: Secondary | ICD-10-CM | POA: Diagnosis not present

## 2017-10-01 DIAGNOSIS — Z86711 Personal history of pulmonary embolism: Secondary | ICD-10-CM | POA: Insufficient documentation

## 2017-10-01 DIAGNOSIS — Z79899 Other long term (current) drug therapy: Secondary | ICD-10-CM | POA: Diagnosis not present

## 2017-10-01 DIAGNOSIS — K625 Hemorrhage of anus and rectum: Secondary | ICD-10-CM | POA: Diagnosis not present

## 2017-10-01 DIAGNOSIS — E119 Type 2 diabetes mellitus without complications: Secondary | ICD-10-CM | POA: Insufficient documentation

## 2017-10-01 LAB — I-STAT BETA HCG BLOOD, ED (MC, WL, AP ONLY): I-stat hCG, quantitative: <5 m[IU]/mL (ref <5)

## 2017-10-01 LAB — COMPREHENSIVE METABOLIC PANEL
ALT: 28 U/L (ref 0–44)
AST: 19 U/L (ref 15–41)
Albumin: 3.5 g/dL (ref 3.5–5.0)
Alkaline Phosphatase: 94 U/L (ref 38–126)
Anion gap: 10 (ref 5–15)
BUN: 6 mg/dL (ref 6–20)
CO2: 26 mmol/L (ref 22–32)
Calcium: 9.1 mg/dL (ref 8.9–10.3)
Chloride: 106 mmol/L (ref 98–111)
Creatinine, Ser: 0.81 mg/dL (ref 0.44–1.00)
GFR calc Af Amer: >60 mL/min (ref >60)
GFR calc non Af Amer: >60 mL/min (ref >60)
Glucose, Bld: 112 mg/dL — ABNORMAL HIGH (ref 70–99)
Potassium: 3.5 mmol/L (ref 3.5–5.1)
Sodium: 142 mmol/L (ref 135–145)
Total Bilirubin: 0.4 mg/dL (ref 0.3–1.2)
Total Protein: 6.9 g/dL (ref 6.5–8.1)

## 2017-10-01 LAB — CBC
HCT: 40.3 % (ref 36.0–46.0)
Hemoglobin: 12.3 g/dL (ref 12.0–15.0)
MCH: 24.1 pg — ABNORMAL LOW (ref 26.0–34.0)
MCHC: 30.5 g/dL (ref 30.0–36.0)
MCV: 79 fL (ref 78.0–100.0)
Platelets: 418 10*3/uL — ABNORMAL HIGH (ref 150–400)
RBC: 5.1 MIL/uL (ref 3.87–5.11)
RDW: 15.9 % — ABNORMAL HIGH (ref 11.5–15.5)
WBC: 12.7 10*3/uL — ABNORMAL HIGH (ref 4.0–10.5)

## 2017-10-01 LAB — TYPE AND SCREEN
ABO/RH(D): A POS
Antibody Screen: NEGATIVE

## 2017-10-01 LAB — POC OCCULT BLOOD, ED: Fecal Occult Bld: POSITIVE — AB

## 2017-10-01 LAB — LIPASE, BLOOD: Lipase: 32 U/L (ref 11–51)

## 2017-10-01 NOTE — ED Provider Notes (Signed)
Patient placed in Quick Look pathway, seen and evaluated   Chief Complaint: blood in stool  HPI:   Pt is a 32 y/o female who presents to the ED today for evaluation of BRBPR. Pt reports 2 episodes of bright red blood in her stool today. Reports blood on tissue and in commode. Pt is on xarelto. She denies abd pain, NVD, urinary sxs. Denies constipation or recent hard stools. Was recently admitted for colitis and was sent home on PO abx.   ROS: BRBPR (one)  Physical Exam:   Gen: No distress  Neuro: Awake and Alert  Skin: Warm    Focused Exam: Abd: BS present in all 4 quadrants. No TTP, no rigidity, guarding or rebound TTP.    Initiation of care has begun. The patient has been counseled on the process, plan, and necessity for staying for the completion/evaluation, and the remainder of the medical screening examination  Pt advised to inform nursing staff immediately if they experience any new or worsening of symptoms while waiting in the waiting room.    Rodney Booze, PA-C 10/01/17 1501    Virgel Manifold, MD 10/02/17 [phone]

## 2017-10-01 NOTE — ED Notes (Signed)
Pt also had admission for colitis in June

## 2017-10-01 NOTE — ED Notes (Signed)
Pt stands on own ability for ortho VS. No light-headed feelings. Strong and steady on feet.

## 2017-10-01 NOTE — ED Triage Notes (Signed)
Pt in c/o bright red blood noted in her bowel movements x2 today, denies pain during bowel movement or straining, denies abdominal pain

## 2017-10-01 NOTE — ED Triage Notes (Signed)
Pt currently taking xarelto, did not take this today

## 2017-10-01 NOTE — ED Provider Notes (Signed)
[facility] EMERGENCY DEPARTMENT Provider Note   CSN: [id number] Arrival date & time: 10/01/17  1443     History   Chief Complaint Chief Complaint  Patient presents with  . GI Bleeding    HPI Virginia Fitzgerald is a 32 y.o. female with a history of DVT and PE on Xarelto colitis, and HTN who presents to the emergency department with a chief complaint of hematochezia.  The patient endorses 2 episodes of large amounts of painless bright red blood with loose stools since she awoke this morning.  She reports that she had a third bowel movement here has some green stool, but only had a small amount of bright red blood on the toilet tissue when she wiped.  She reports that she had some intermittent bilateral lower abdominal cramping earlier today that is since resolved.  She reports that she had some constipation earlier this month that is since resolved.  She denies pain with a bowel movement.  She denies fever, chills, nausea, vomiting, hematuria, vaginal bleeding, pain, or discharge, back pain, dyspnea, chest pain.  She states that she has not taken her home dose of Xarelto today.  She reports an associated headache 3 days ago that resolved after taking Tylenol.  No other headache, dizziness, lightheadedness.  She has never had an EGD or colonoscopy.  The history is provided by the patient. No language interpreter was used.    Past Medical History:  Diagnosis Date  . Anemia   . Bladder infection 03/2017  . Diabetes mellitus without complication ([place])    WITH 2019 PREGNANCY ONLY  . GERD (gastroesophageal reflux disease)    diet controlled - no meds  . Headache    last one 05/25/17 - otc med  . History of blood transfusion 2012   At [facility]  . History of DVT of lower extremity   . History of hiatal hernia   . MRSA infection    not in EPIC - per patient in 2011  . Pulmonary embolism ([place])   . S/P IVC filter   . SVD (spontaneous vaginal delivery)    x 3  .  Thrombocytosis [facility])     Patient Active Problem List   Diagnosis Date Noted  . Colitis 07/27/2017  . Missed abortion 05/29/2017  . Dysplasia of cervix, low grade (CIN 1) 03/28/2017  . Diabetes type 2, 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 ([place]) 03/28/2017  . Essential hypertension, benign 03/20/2017  . Recurrent pulmonary embolism ([place]) 03/20/2017  . BMI 50.0-59.9, adult ([place]) 02/28/2017  . Iron deficiency anemia 11/14/2011    Past Surgical History:  Procedure Laterality Date  . DILATION AND EVACUATION N/A 05/29/2017   Procedure: DILATATION AND EVACUATION;  Surgeon: Lavonia Drafts, MD;  Location: [facility];  Service: Gynecology;  Laterality: N/A;  . WISDOM TOOTH EXTRACTION       OB History    Gravida  5   Para  3   Term  3   Preterm  0   AB  2   Living  3     SAB  2   TAB  0   Ectopic  0   Multiple      Live Births  3        Obstetric Comments  Largest prior was 7lbs 7oz. No issues with deliveries.          Home Medications    Prior to Admission medications   Medication Sig Start Date End Date Taking? Authorizing Provider  ACCU-CHEK FASTCLIX  LANCETS MISC 1 Device by Percutaneous route 4 (four) times daily. 03/28/17  Yes Aletha Halim, MD  rivaroxaban (XARELTO) 20 MG TABS tablet Take 1 tablet (20 mg total) by mouth daily with supper. 07/19/17  Yes Nat Christen, MD  Blood Glucose Monitoring Suppl (ACCU-CHEK GUIDE) w/Device KIT 1 Device by Does not apply route 4 (four) times daily. 04/10/17   Donnamae Jude, MD  glucose blood (ACCU-CHEK GUIDE) test strip Use as instructed QID 04/10/17   Donnamae Jude, MD    Family History Family History  Problem Relation Age of Onset  . Diabetes Mother   . Hypertension Mother   . Hypertension Maternal Uncle   . Diabetes Maternal Uncle   . Hypertension Maternal Grandmother     Social History Social History   Tobacco Use  . Smoking status: Never Smoker  . Smokeless tobacco: Never Used  . Tobacco comment: never used  tobacco  Substance Use Topics  . Alcohol use: No    Alcohol/week: 0.0 standard drinks  . Drug use: No     Allergies   Patient has no known allergies.   Review of Systems Review of Systems  Constitutional: Negative for activity change, chills and fever.  Respiratory: Negative for shortness of breath.   Cardiovascular: Negative for chest pain.  Gastrointestinal: Positive for anal bleeding, blood in stool, constipation and diarrhea. Negative for abdominal pain and vomiting.  Genitourinary: Negative for decreased urine volume, difficulty urinating, dysuria, hematuria, vaginal bleeding and vaginal pain.  Musculoskeletal: Negative for back pain.  Skin: Negative for rash.  Allergic/Immunologic: Negative for immunocompromised state.  Neurological: Negative for dizziness, syncope, weakness, numbness and headaches.  Psychiatric/Behavioral: Negative for confusion.   Physical Exam Updated Vital Signs BP (!) 137/96   Pulse 86   Temp 98.7 F (37.1 C) (Oral)   Resp 16   SpO2 100%   Physical Exam  Constitutional: No distress.  Well-appearing.  HENT:  Head: Normocephalic.  Eyes: Conjunctivae are normal.  Neck: Neck supple.  Cardiovascular: Normal rate and regular rhythm. Exam reveals no gallop and no friction rub.  No murmur heard. Pulmonary/Chest: Effort normal. No stridor. No respiratory distress. She has no wheezes. She has no rales. She exhibits no tenderness.  Abdominal: Soft. She exhibits no distension and no mass. There is no tenderness. There is no rebound and no guarding. No hernia.  Abdomen is soft, nontender, nondistended.  Obese.  No peritoneal signs.  Genitourinary:  Genitourinary Comments: Chaperoned exam.  No external hemorrhoids are noted.  No focal tenderness with anal exam.  Soft brown stool noted. No fissures or sinus tracts.   Neurological: She is alert.  Skin: Skin is warm. No rash noted. She is not diaphoretic.  Psychiatric: Her behavior is normal.  Nursing  note and vitals reviewed.  ED Treatments / Results  Labs (all labs ordered are listed, but only abnormal results are displayed) Labs Reviewed  COMPREHENSIVE METABOLIC PANEL - Abnormal; Notable for the following components:      Result Value   Glucose, Bld 112 (*)    All other components within normal limits  CBC - Abnormal; Notable for the following components:   WBC 12.7 (*)    MCH 24.1 (*)    RDW 15.9 (*)    Platelets 418 (*)    All other components within normal limits  POC OCCULT BLOOD, ED - Abnormal; Notable for the following components:   Fecal Occult Bld POSITIVE (*)    All other components within normal limits  LIPASE, BLOOD  I-STAT BETA HCG BLOOD, ED ([facility], [facility], [facility] ONLY)  TYPE AND SCREEN    EKG None  Radiology No results found.  Procedures Procedures (including critical care time)  Medications Ordered in ED Medications - No data to display   Initial Impression / Assessment and Plan / ED Course  I have reviewed the triage vital signs and the nursing notes.  Pertinent labs & imaging results that were available during my care of the patient were reviewed by me and considered in my medical decision making (see chart for details).     32 year old female a history of DVT and PE on Xarelto, colitis, and HTN presenting with hematochezia, onset this morning.  She has had 2 episodes of painless bright red blood per rectum and most recently had a bowel movement with minimal bright red blood that was only present on the toilet tissue.  She was most recently admitted on June 29 for colitis, but reports that all the symptoms have resolved.  She has been having no abdominal discomfort, nausea, vomiting, or constitutional symptoms.   Hemoccult is positive.  Hemoglobin is normal at 12.3.  Labs are otherwise reassuring.  Discussed the patient with Dr. Johnney Killian, attending physician.  Given that she has no other symptoms and hematochezia has been improving since onset this morning in  the setting of a normal hemoglobin, will discharge the patient to home with GI follow-up in the outpatient setting.  She has never had a colonoscopy or endoscopy.  No family history of colorectal cancer.  No visible external hemorrhoids on exam.  The patient was given strict return precautions to the emergency department if she develop new or worsening symptoms.  Doubt recurrence of colitis or diverticulitis at this time given the patient's reassuring work-up today.  The patient is requesting a work note.  She is hemodynamically stable in no acute distress.  She is safe for discharge at this time.  Final Clinical Impressions(s) / ED Diagnoses   Final diagnoses:  Bright red blood per rectum  Rectal bleeding    ED Discharge Orders    None       Joanne Gavel, PA-C 10/02/17 0018    Charlesetta Shanks, MD 10/17/17 1048

## 2017-10-01 NOTE — Discharge Instructions (Addendum)
Thank you for allowing me to care for you today in the Emergency Department.   Please call tomorrow to schedule a follow-up appointment with gastroenterology.  Continue to take your home medications as prescribed.  Return to the emergency department if you develop worsening blood in your stool, severe abdominal pain, high fever, severe shortness of breath, or other new, concerning symptoms.

## 2018-07-30 ENCOUNTER — Inpatient Hospital Stay (HOSPITAL_COMMUNITY)
Admission: AD | Admit: 2018-07-30 | Discharge: 2018-07-30 | Disposition: A | Discharge status: Home / Self Care | Payer: Medicaid Other | Attending: Obstetrics & Gynecology | Admitting: Obstetrics & Gynecology

## 2018-07-30 ENCOUNTER — Other Ambulatory Visit: Payer: Self-pay

## 2018-07-30 DIAGNOSIS — Z3202 Encounter for pregnancy test, result negative: Secondary | ICD-10-CM | POA: Insufficient documentation

## 2018-07-30 DIAGNOSIS — N912 Amenorrhea, unspecified: Secondary | ICD-10-CM | POA: Diagnosis present

## 2018-07-30 LAB — URINALYSIS, ROUTINE W REFLEX MICROSCOPIC
Bilirubin Urine: NEGATIVE
Glucose, UA: NEGATIVE mg/dL
Hgb urine dipstick: NEGATIVE
Ketones, ur: NEGATIVE mg/dL
Nitrite: NEGATIVE
Protein, ur: 30 mg/dL — AB
Specific Gravity, Urine: 1.028 (ref 1.005–1.030)
pH: 5 (ref 5.0–8.0)

## 2018-07-30 LAB — POCT PREGNANCY, URINE: Preg Test, Ur: NEGATIVE

## 2018-07-30 LAB — HCG, SERUM, QUALITATIVE: Preg, Serum: NEGATIVE

## 2018-07-30 NOTE — MAU Note (Signed)
Pt reports to MAU stating she had a +pregnacny test at home and her LMP was May 21st 2020. Pt reports some light spotting on June 1st but no other bleeding. Pt reports a sharp upper belly pain that also feels like it burns. Pt reports frequent urination and back pain.

## 2018-07-30 NOTE — MAU Provider Note (Signed)
First Provider Initiated Contact with Patient 07/30/18 2242      S Virginia Fitzgerald is a 33 y.o. [phone] non-pregnant female who presents to MAU today with complaint of missed menses.   O BP (!) 158/92 (BP Location: Right Arm)   Pulse (!) 118   Resp 19   Wt (!) 142.7 kg   LMP 06/19/2018 (Exact Date)   BMI 55.73 kg/m    Patient Vitals for the past 24 hrs:  BP Temp src Pulse Resp Weight  07/30/18 2039 (!) 158/92 - - - -  07/30/18 2020 (!) 160/110 Oral (!) 118 19 (!) 142.7 kg   Physical Exam  Constitutional: She is oriented to person, place, and time. She appears well-developed and well-nourished. No distress.  HENT:  Head: Normocephalic and atraumatic.  Neurological: She is alert and oriented to person, place, and time.  Skin: Skin is warm and dry. She is not diaphoretic.  Psychiatric: She has a normal mood and affect. Her behavior is normal. Judgment and thought content normal.   A Non pregnant female Medical screening exam complete  P Discharge from MAU in stable condition Patient given the option of transfer to [facility] for further evaluation or seek care in outpatient facility of choice List of options for follow-up given  Warning signs for worsening condition that would warrant emergency follow-up discussed Patient may return to MAU as needed for pregnancy related complaints  Micajah Dennin, Gerrie Nordmann, NP 07/30/2018 10:47 PM

## 2018-09-22 ENCOUNTER — Telehealth: Payer: Self-pay | Admitting: Obstetrics & Gynecology

## 2018-09-22 NOTE — Telephone Encounter (Signed)
Spoke with Ms. Dicesare about her appointment. Per Mel Almond RN, she suggested she take a pregency test and if test is negative to give [name] a call. Patient stated she would take an at home test.

## 2018-09-23 ENCOUNTER — Ambulatory Visit: Payer: Self-pay | Admitting: Student

## 2018-09-24 ENCOUNTER — Ambulatory Visit (HOSPITAL_COMMUNITY)
Admission: EM | Admit: 2018-09-24 | Discharge: 2018-09-24 | Disposition: A | Discharge status: Home / Self Care | Payer: Medicaid Other | Attending: Emergency Medicine | Admitting: Emergency Medicine

## 2018-09-24 ENCOUNTER — Other Ambulatory Visit: Payer: Self-pay

## 2018-09-24 ENCOUNTER — Encounter (HOSPITAL_COMMUNITY): Payer: Self-pay

## 2018-09-24 DIAGNOSIS — Z3202 Encounter for pregnancy test, result negative: Secondary | ICD-10-CM | POA: Diagnosis not present

## 2018-09-24 DIAGNOSIS — R739 Hyperglycemia, unspecified: Secondary | ICD-10-CM

## 2018-09-24 DIAGNOSIS — N926 Irregular menstruation, unspecified: Secondary | ICD-10-CM | POA: Diagnosis not present

## 2018-09-24 DIAGNOSIS — R112 Nausea with vomiting, unspecified: Secondary | ICD-10-CM

## 2018-09-24 LAB — POCT URINALYSIS DIP (DEVICE)
Bilirubin Urine: NEGATIVE
Glucose, UA: NEGATIVE mg/dL
Hgb urine dipstick: NEGATIVE
Leukocytes,Ua: NEGATIVE
Nitrite: NEGATIVE
Protein, ur: 100 mg/dL — AB
Specific Gravity, Urine: >=1.03 (ref 1.005–1.030)
Urobilinogen, UA: 0.2 mg/dL (ref 0.0–1.0)
pH: 5.5 (ref 5.0–8.0)

## 2018-09-24 LAB — POCT PREGNANCY, URINE: Preg Test, Ur: NEGATIVE

## 2018-09-24 LAB — GLUCOSE, CAPILLARY: Glucose-Capillary: 223 mg/dL — ABNORMAL HIGH (ref 70–99)

## 2018-09-24 MED ORDER — ONDANSETRON 8 MG PO TBDP: ORAL_TABLET | ORAL | 0 refills | Status: DC

## 2018-09-24 MED ORDER — ONDANSETRON 4 MG PO TBDP
ORAL_TABLET | ORAL | Status: AC
  Filled 2018-09-24: qty 2

## 2018-09-24 MED ORDER — ONDANSETRON 4 MG PO TBDP
8.0000 mg | ORAL_TABLET | Freq: Once | ORAL | Status: AC
  Administered 2018-09-24: 8 mg via ORAL

## 2018-09-24 NOTE — ED Provider Notes (Signed)
HPI  SUBJECTIVE:  Virginia Fitzgerald is a 33 y.o. female who presents with 5 episodes of nonbilious bloody emesis with nausea 30 minutes after eating [employer] for lunch. She states that she was fine this morning.  She states that she is feeling "a little better now".  No abdominal pain, abdominal distention, diarrhea.  She states that she is unable to tolerate any p.o.  No change in urine output.  No lightheadedness, dizziness, fevers, body aches, headaches, nasal congestion, sore throat, fatigue, loss of sense of taste or smell.  She states that she has not had a period since July, and is concerned that she may be pregnant.  She is not on any birth control currently.  She is not trying to get pregnant.  She has a past medical history of gestational diabetes, DVT, PE status post IVC placement, anemia.  No history of irregular periods.  LMP: July.  [facility]  States that she had an appointment with them on Tuesday for evaluation of her irregular periods, but that they required a pregnancy test prior to her being evaluated.  She is going to reschedule this appointment.   Past Medical History:  Diagnosis Date  . Anemia   . Bladder infection 03/2017  . Diabetes mellitus without complication ([place])    WITH 2019 PREGNANCY ONLY  . GERD (gastroesophageal reflux disease)    diet controlled - no meds  . Headache    last one 05/25/17 - otc med  . History of blood transfusion 2012   At [facility]  . History of DVT of lower extremity   . History of hiatal hernia   . MRSA infection    not in EPIC - per patient in 2011  . Pulmonary embolism ([place])   . S/P IVC filter   . SVD (spontaneous vaginal delivery)    x 3  . Thrombocytosis ([place])     Past Surgical History:  Procedure Laterality Date  . DILATION AND EVACUATION N/A 05/29/2017   Procedure: DILATATION AND EVACUATION;  Surgeon: Lavonia Drafts, MD;  Location: [facility];  Service: Gynecology;  Laterality: N/A;  . WISDOM TOOTH EXTRACTION       Family History  Problem Relation Age of Onset  . Diabetes Mother   . Hypertension Mother   . Hypertension Maternal Uncle   . Diabetes Maternal Uncle   . Hypertension Maternal Grandmother     Social History   Tobacco Use  . Smoking status: Never Smoker  . Smokeless tobacco: Never Used  . Tobacco comment: never used tobacco  Substance Use Topics  . Alcohol use: No    Alcohol/week: 0.0 standard drinks  . Drug use: No    No current facility-administered medications for this encounter.   Current Outpatient Medications:  .  ACCU-CHEK FASTCLIX LANCETS MISC, 1 Device by Percutaneous route 4 (four) times daily., Disp: 100 each, Rfl: 12 .  Blood Glucose Monitoring Suppl (ACCU-CHEK GUIDE) w/Device KIT, 1 Device by Does not apply route 4 (four) times daily., Disp: 1 kit, Rfl: 0 .  glucose blood (ACCU-CHEK GUIDE) test strip, Use as instructed QID, Disp: 100 each, Rfl: 12 .  ondansetron (ZOFRAN ODT) 8 MG disintegrating tablet, 1/2- 1 tablet q 8 hr prn nausea, vomiting, Disp: 20 tablet, Rfl: 0 .  rivaroxaban (XARELTO) 20 MG TABS tablet, Take 1 tablet (20 mg total) by mouth daily with supper., Disp: 30 tablet, Rfl: 2  No Known Allergies   ROS  As noted in HPI.   Physical Exam  BP (!) 155/103 (BP Location: Left Wrist)   Pulse (!) 117   Temp 98.4 F (36.9 C) (Oral)   Resp 19   LMP 07/10/2018 (Exact Date)   SpO2 100%   Constitutional: Well developed, well nourished, no acute distress Eyes:  EOMI, conjunctiva normal bilaterally HENT: Normocephalic, atraumatic,mucus membranes moist Respiratory: Normal inspiratory effort Cardiovascular: Mild regular tachycardia, no murmurs rubs or gallops. GI: Soft, nontender, nondistended, no guarding, rebound.  Active bowel sounds.  Negative McBurney, negative Murphy. skin: No rash, skin intact Musculoskeletal: no deformities Neurologic: Alert & oriented x 3, no focal neuro deficits Psychiatric: Speech and behavior appropriate   ED  Course   Medications  ondansetron (ZOFRAN-ODT) disintegrating tablet 8 mg (8 mg Oral Given 09/24/18 2047)  ondansetron (ZOFRAN-ODT) 4 MG disintegrating tablet (has no administration in time range)    Orders Placed This Encounter  Procedures  . Glucose, capillary    Standing Status:   Standing    Number of Occurrences:   1  . POC urine pregnancy    Standing Status:   Standing    Number of Occurrences:   1  . POCT urinalysis dip (device)    Standing Status:   Standing    Number of Occurrences:   1  . Pregnancy, urine POC    Standing Status:   Standing    Number of Occurrences:   1  . POC CBG monitoring    Standing Status:   Standing    Number of Occurrences:   1    Results for orders placed or performed during the hospital encounter of 09/24/18 (from the past 24 hour(s))  POCT urinalysis dip (device)     Status: Abnormal   Collection Time: 09/24/18  8:15 PM  Result Value Ref Range   Glucose, UA NEGATIVE NEGATIVE mg/dL   Bilirubin Urine NEGATIVE NEGATIVE   Ketones, ur TRACE (A) NEGATIVE mg/dL   Specific Gravity, Urine >=1.030 1.005 - 1.030   Hgb urine dipstick NEGATIVE NEGATIVE   pH 5.5 5.0 - 8.0   Protein, ur 100 (A) NEGATIVE mg/dL   Urobilinogen, UA 0.2 0.0 - 1.0 mg/dL   Nitrite NEGATIVE NEGATIVE   Leukocytes,Ua NEGATIVE NEGATIVE  Pregnancy, urine POC     Status: None   Collection Time: 09/24/18  8:27 PM  Result Value Ref Range   Preg Test, Ur NEGATIVE NEGATIVE  Glucose, capillary     Status: Abnormal   Collection Time: 09/24/18  9:02 PM  Result Value Ref Range   Glucose-Capillary 223 (H) 70 - 99 mg/dL   No results found.  ED Clinical Impression  1. Non-intractable vomiting with nausea, unspecified vomiting type   2. Missed period   3. Urine pregnancy test negative   4. Hyperglycemia      ED Assessment/Plan  Suspect food poisoning.  Patient given 8 mg of Zofran ODT.  She was tolerating p.o. prior to discharge.  While that she is slightly dehydrated with  tachycardia and the ketones in her urine, she has a benign abdomen.   Feel that we can attempt oral rehydration at home.  We will have her follow-up with OB/GYN regarding the missed menses.  She will schedule an appointment with them next week.   Fingerstick 233.  Advised her to measure her blood sugar twice a day and keep a log of this, and to bring it to her OB/GYN appointment.  She needs to follow-up with them by next week regarding this.  Doubt DKA.  Discussed  MDM, treatment  plan, and plan for follow-up with patient. Discussed sn/sx that should prompt return to the ED. patient agrees with plan.   Meds ordered this encounter  Medications  . ondansetron (ZOFRAN-ODT) disintegrating tablet 8 mg  . ondansetron (ZOFRAN ODT) 8 MG disintegrating tablet    Sig: 1/2- 1 tablet q 8 hr prn nausea, vomiting    Dispense:  20 tablet    Refill:  0    *This clinic note was created using Lobby[employer]. Therefore, there may be occasional mistakes despite careful proofreading.   ?   Melynda Ripple, MD 09/25/18 [phone]

## 2018-09-24 NOTE — Discharge Instructions (Addendum)
You may take the Zofran up to 3 times a day.  It may make you constipated.  Push plenty of electrolyte containing fluids such as Pedialyte or Gatorade.  Go immediately to the ER if you start throwing up blood, bile, if you start having severe abdominal pain, if you continue to vomit despite the Zofran, fevers above 100.4, if you have not urinated in 12 hours, or for any other concerns.  Measure your blood sugar twice a day and keep a log of this.  Bring it with you to your next doctor's appointment.  You need to see your OB/GYN within the next week.  You may need to be started on medication.

## 2018-09-24 NOTE — ED Triage Notes (Signed)
Patient presents to Urgent Care with complaints of irregular periods since june. Patient reports she had been throwing up this morning as well, has taken pregnancy tests at home and is unsure what the results were.

## 2018-10-03 ENCOUNTER — Encounter (HOSPITAL_COMMUNITY): Payer: Self-pay | Admitting: Emergency Medicine

## 2018-10-03 ENCOUNTER — Emergency Department (HOSPITAL_COMMUNITY)
Admission: EM | Admit: 2018-10-03 | Discharge: 2018-10-04 | Disposition: A | Discharge status: Home / Self Care | Payer: Medicaid Other | Attending: Emergency Medicine | Admitting: Emergency Medicine

## 2018-10-03 ENCOUNTER — Other Ambulatory Visit: Payer: Self-pay

## 2018-10-03 DIAGNOSIS — Z7901 Long term (current) use of anticoagulants: Secondary | ICD-10-CM | POA: Insufficient documentation

## 2018-10-03 DIAGNOSIS — I10 Essential (primary) hypertension: Secondary | ICD-10-CM | POA: Insufficient documentation

## 2018-10-03 DIAGNOSIS — E119 Type 2 diabetes mellitus without complications: Secondary | ICD-10-CM | POA: Diagnosis not present

## 2018-10-03 DIAGNOSIS — R350 Frequency of micturition: Secondary | ICD-10-CM | POA: Diagnosis present

## 2018-10-03 DIAGNOSIS — N3090 Cystitis, unspecified without hematuria: Secondary | ICD-10-CM

## 2018-10-03 NOTE — ED Triage Notes (Signed)
Patient reports urinary frequency this afternoon , denies hematuria or fever .

## 2018-10-04 LAB — URINALYSIS, ROUTINE W REFLEX MICROSCOPIC
Bacteria, UA: NONE SEEN
Bilirubin Urine: NEGATIVE
Glucose, UA: NEGATIVE mg/dL
Hgb urine dipstick: NEGATIVE
Ketones, ur: NEGATIVE mg/dL
Nitrite: NEGATIVE
Protein, ur: >=300 mg/dL — AB
Specific Gravity, Urine: 1.031 — ABNORMAL HIGH (ref 1.005–1.030)
pH: 7 (ref 5.0–8.0)

## 2018-10-04 LAB — PREGNANCY, URINE: Preg Test, Ur: NEGATIVE

## 2018-10-04 MED ORDER — NITROFURANTOIN MONOHYD MACRO 100 MG PO CAPS
100.0000 mg | ORAL_CAPSULE | Freq: Once | ORAL | Status: AC
  Administered 2018-10-04: 100 mg via ORAL
  Filled 2018-10-04: qty 1

## 2018-10-04 MED ORDER — PHENAZOPYRIDINE HCL 100 MG PO TABS
200.0000 mg | ORAL_TABLET | Freq: Once | ORAL | Status: AC
  Administered 2018-10-04: 200 mg via ORAL
  Filled 2018-10-04: qty 2

## 2018-10-04 MED ORDER — PHENAZOPYRIDINE HCL 200 MG PO TABS: 200.0000 mg | ORAL_TABLET | Freq: Three times a day (TID) | ORAL | 0 refills | Status: DC

## 2018-10-04 MED ORDER — NITROFURANTOIN MONOHYD MACRO 100 MG PO CAPS
100.0000 mg | ORAL_CAPSULE | Freq: Once | ORAL | Status: DC
  Filled 2018-10-04: qty 1

## 2018-10-04 MED ORDER — NITROFURANTOIN MONOHYD MACRO 100 MG PO CAPS: 100.0000 mg | ORAL_CAPSULE | Freq: Two times a day (BID) | ORAL | 0 refills | Status: DC

## 2018-10-04 NOTE — ED Notes (Signed)
Patient verbalized understanding of discharge instructions. Opportunity for questions were provided. Pt. ambulatory and discharged home.  

## 2018-10-04 NOTE — Discharge Instructions (Addendum)
We suspect that you have a UTI. Take the medicines prescribed.  Return to the ER if you start having abdominal pain, vaginal discharge or the symptoms are getting worse.

## 2018-10-04 NOTE — ED Provider Notes (Signed)
[facility] EMERGENCY DEPARTMENT Provider Note   CSN: [id number] Arrival date & time: 10/03/18  2331     History   Chief Complaint Chief Complaint  Patient presents with  . Urinary Frequency    HPI Virginia Fitzgerald is a 33 y.o. female.     HPI  33 year old female comes in a chief complaint of urinary frequency. Patient has history of DVT and is on blood thinner.  She reports that earlier this evening she suddenly started having urinary frequency.  When she goes to the bathroom there is no urine.  She has no abdominal pain and in the ED she had some burning with urination.  She denies any back pain, nausea, vomiting, fevers, chills, vaginal discharge, bleeding.  There is no history of kidney stone.  Patient had STD about 2 years ago, but reports that she is in a monogamous and stable relationship with single partner.  Past Medical History:  Diagnosis Date  . Anemia   . Bladder infection 03/2017  . Diabetes mellitus without complication ([place])    WITH 2019 PREGNANCY ONLY  . GERD (gastroesophageal reflux disease)    diet controlled - no meds  . Headache    last one 05/25/17 - otc med  . History of blood transfusion 2012   At [facility]  . History of DVT of lower extremity   . History of hiatal hernia   . MRSA infection    not in EPIC - per patient in 2011  . Pulmonary embolism ([place])   . S/P IVC filter   . SVD (spontaneous vaginal delivery)    x 3  . Thrombocytosis [facility])     Patient Active Problem List   Diagnosis Date Noted  . Colitis 07/27/2017  . Missed abortion 05/29/2017  . Dysplasia of cervix, low grade (CIN 1) 03/28/2017  . 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 Essential hypertension, benign 03/20/2017  . 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 BMI 50.0-59.9, adult ([place]) 02/28/2017  . Iron deficiency anemia 11/14/2011    Past Surgical History:  Procedure Laterality Date  . DILATION AND EVACUATION N/A 05/29/2017   Procedure: DILATATION AND EVACUATION;  Surgeon: Lavonia Drafts, MD;  Location: [facility];  Service: Gynecology;  Laterality: N/A;  . WISDOM TOOTH EXTRACTION       OB History    Gravida  5   Para  3   Term  3   Preterm  0   AB  2   Living  3     SAB  2   TAB  0   Ectopic  0   Multiple      Live Births  3        Obstetric Comments  Largest prior was 7lbs 7oz. No issues with deliveries.          Home Medications    Prior to Admission medications   Medication Sig Start Date End Date Taking? Authorizing Provider  ACCU-CHEK FASTCLIX LANCETS MISC 1 Device by Percutaneous route 4 (four) times daily. 03/28/17   Aletha Halim, MD  Blood Glucose Monitoring Suppl (ACCU-CHEK GUIDE) w/Device KIT 1 Device by Does not apply route 4 (four) times daily. 04/10/17   Donnamae Jude, MD  glucose blood (ACCU-CHEK GUIDE) test strip Use as instructed QID 04/10/17   Donnamae Jude, MD  nitrofurantoin, macrocrystal-monohydrate, (MACROBID) 100 MG capsule Take 1 capsule (100 mg total) by mouth 2 (two) times daily. 10/04/18   Varney Biles, MD  ondansetron (ZOFRAN ODT) 8 MG disintegrating tablet 1/2- 1 tablet q 8 hr prn nausea, vomiting 09/24/18   Melynda Ripple, MD  phenazopyridine (PYRIDIUM) 200 MG tablet Take 1 tablet (200 mg total) by mouth 3 (three) times daily. 10/04/18   Varney Biles, MD  rivaroxaban (XARELTO) 20 MG TABS tablet Take 1 tablet (20 mg total) by mouth daily with supper. 07/19/17   Nat Christen, MD    Family History Family History  Problem Relation Age of Onset  . Diabetes Mother   . Hypertension Mother   . Hypertension Maternal Uncle   . Diabetes Maternal Uncle   . Hypertension Maternal Grandmother     Social History Social History   Tobacco Use  . Smoking status: Never Smoker  . Smokeless tobacco: Never Used  . Tobacco comment: never used tobacco  Substance Use Topics  . Alcohol use: No    Alcohol/week: 0.0 standard drinks  . Drug use: No      Allergies   Patient has no known allergies.   Review of Systems Review of Systems  Constitutional: Positive for activity change.  Gastrointestinal: Negative for abdominal pain, nausea and vomiting.  Genitourinary: Positive for dysuria and urgency. Negative for flank pain, hematuria, vaginal bleeding and vaginal discharge.  Allergic/Immunologic: Negative for immunocompromised state.     Physical Exam Updated Vital Signs BP (!) 143/89 (BP Location: Right Arm)   Pulse 99   Temp 98.2 F (36.8 C) (Oral)   Resp 18   LMP 08/09/2018 (Approximate)   SpO2 97%   Physical Exam Vitals signs and nursing note reviewed.  Constitutional:      Appearance: She is well-developed.  HENT:     Head: Normocephalic and atraumatic.  Neck:     Musculoskeletal: Normal range of motion and neck supple.  Cardiovascular:     Rate and Rhythm: Normal rate.  Pulmonary:     Effort: Pulmonary effort is normal.  Abdominal:     General: Bowel sounds are normal.     Tenderness: There is no abdominal tenderness.  Skin:    General: Skin is warm and dry.  Neurological:     Mental Status: She is alert and oriented to person, place, and time.      ED Treatments / Results  Labs (all labs ordered are listed, but only abnormal results are displayed) Labs Reviewed  URINALYSIS, ROUTINE W REFLEX MICROSCOPIC - Abnormal; Notable for the following components:      Result Value   APPearance CLOUDY (*)    Specific Gravity, Urine 1.031 (*)    Protein, ur >=300 (*)    Leukocytes,Ua LARGE (*)    All other components within normal limits  PREGNANCY, URINE  GC/CHLAMYDIA PROBE AMP ([place]) NOT AT [facility]    EKG None  Radiology No results found.  Procedures Procedures (including critical care time)  Medications Ordered in ED Medications  nitrofurantoin (macrocrystal-monohydrate) (MACROBID) capsule 100 mg (100 mg Oral Given 10/04/18 0355)  phenazopyridine (PYRIDIUM) tablet 200 mg (200 mg Oral Given 10/04/18  0313)     Initial Impression / Assessment and Plan / ED Course  I have reviewed the triage vital signs and the nursing notes.  Pertinent labs & imaging results that were available during my care of the patient were reviewed by me and considered in my medical decision making (see chart for details).        33 year old female comes in a chief complaint of urinary frequency, burning with urination.  Symptoms started earlier this evening.  There is no abdominal pain, flank pain.  There is no history of kidney stones and patient is not engaged in any high risk sexual activity.  She does not think she has STD and is declining pelvic exam for now. She has had bladder infection in the past, and the history is suggestive of cystitis.  UA reveals no bacteriuria, but there is pyuria and it is leukocyte positive.  She has agreed to sending GC and chlamydia in her urine.  We will start her on Macrobid and Pyridium.  If her symptoms get worse or she starts developing vaginal discharge or abdominal pain she will return to the ER.  Final Clinical Impressions(s) / ED Diagnoses   Final diagnoses:  Cystitis    ED Discharge Orders         Ordered    phenazopyridine (PYRIDIUM) 200 MG tablet  3 times daily     10/04/18 0412    nitrofurantoin, macrocrystal-monohydrate, (MACROBID) 100 MG capsule  2 times daily     10/04/18 0415           Varney Biles, MD 10/04/18 [phone]

## 2018-11-25 ENCOUNTER — Ambulatory Visit: Payer: Medicaid Other | Admitting: Medical

## 2018-12-09 NOTE — Telephone Encounter (Signed)
error 

## 2019-07-26 IMAGING — CT CT ANGIO CHEST
2 of 6 series · 18 of 36 positions shown · IV contrast (Omni 300)
Comparison: 02/24/2017

CLINICAL DATA: Two week history of pain in the right leg, swelling
in the feet. History of DVTs. Upper abdominal pain [REDACTED] after
having a miscarriage.

EXAM:
CT ANGIOGRAPHY CHEST WITH CONTRAST
TECHNIQUE: Multidetector CT imaging of the chest was performed using the
standard protocol during bolus administration of intravenous
contrast. Multiplanar CT image reconstructions and MIPs were
obtained to evaluate the vascular anatomy.
CONTRAST:  56mL LMOCD8-CF0 IOPAMIDOL (LMOCD8-CF0) INJECTION 76%

[Series 7: pe thins · axial · 0.64mm/px · z∈[+1122,+1361]mm · 17 of 269 slices shown]
[im 15/269  lung]
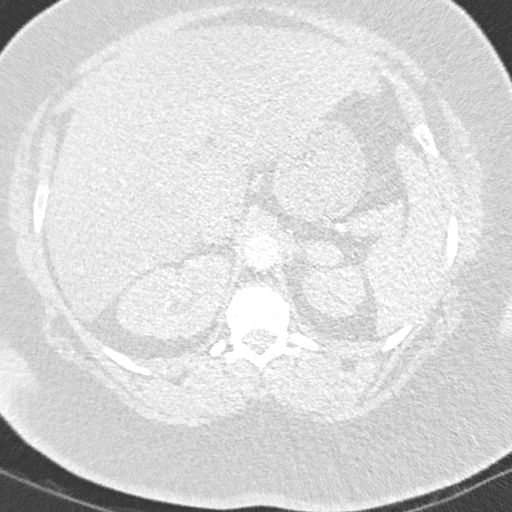
[im 30/269  mediastinal]
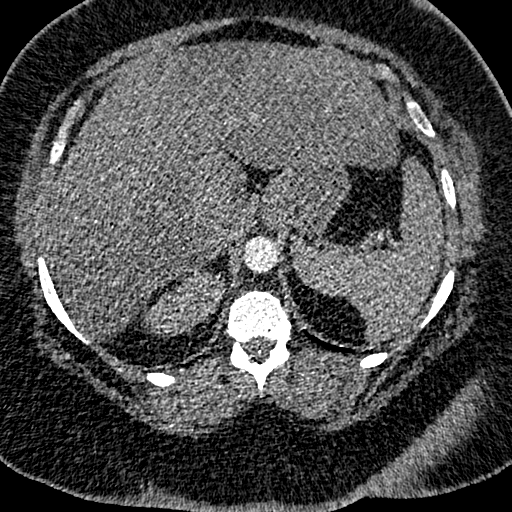
[im 45/269  lung]
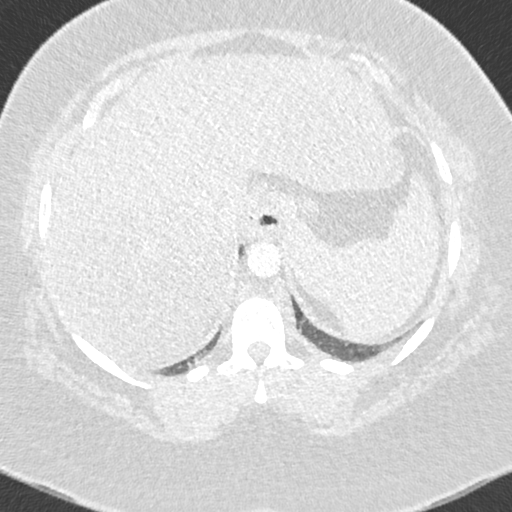
[im 60/269  mediastinal]
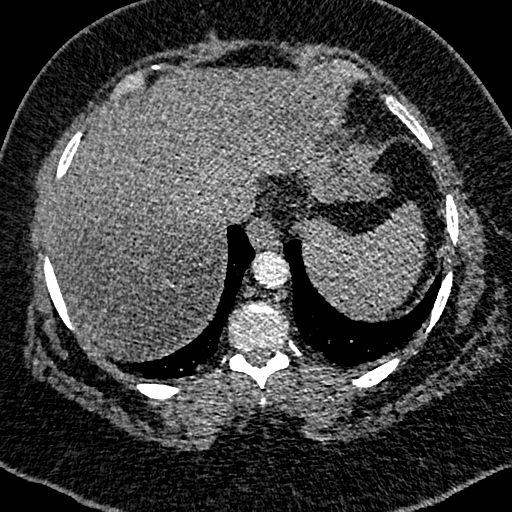
[im 75/269  lung]
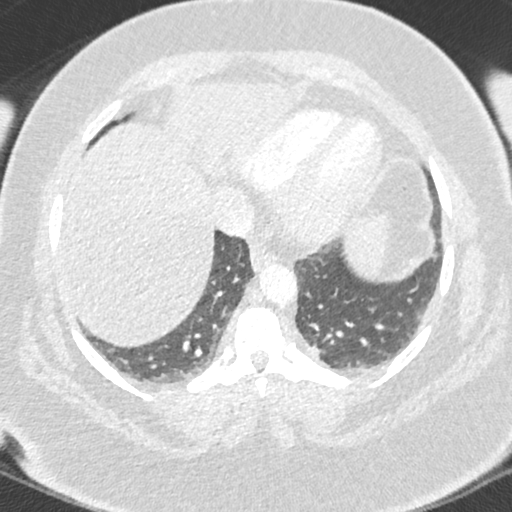
[im 90/269  mediastinal]
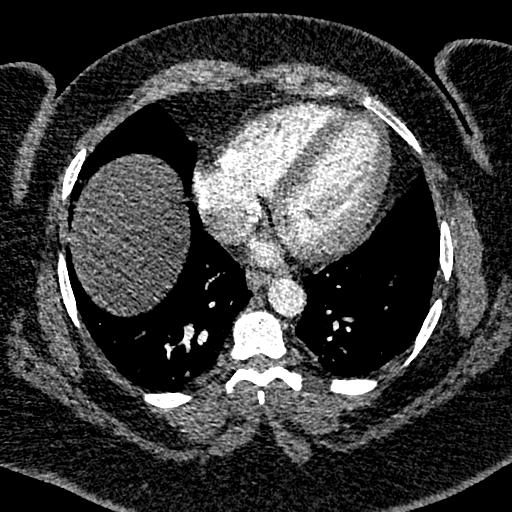
[im 105/269  lung]
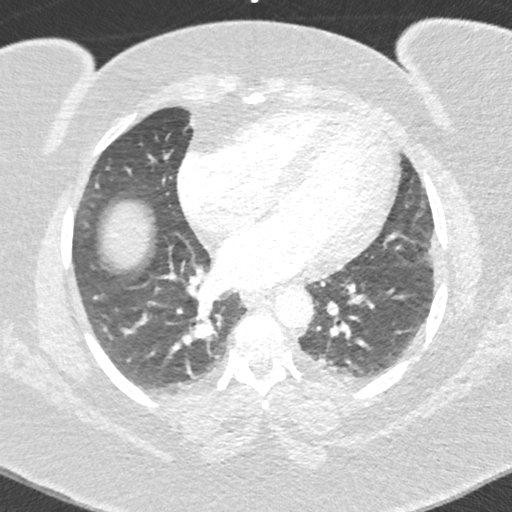
[im 120/269  mediastinal]
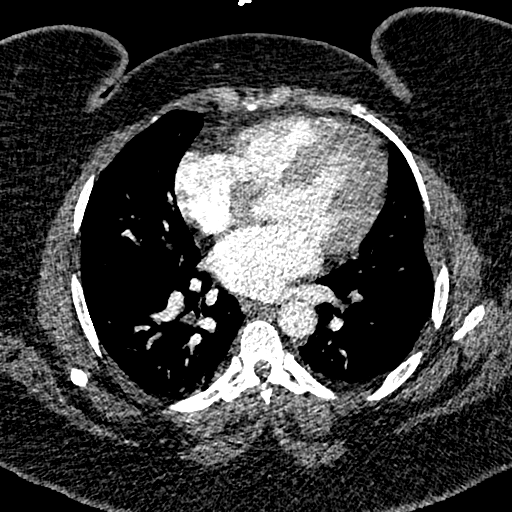
[im 135/269  lung]
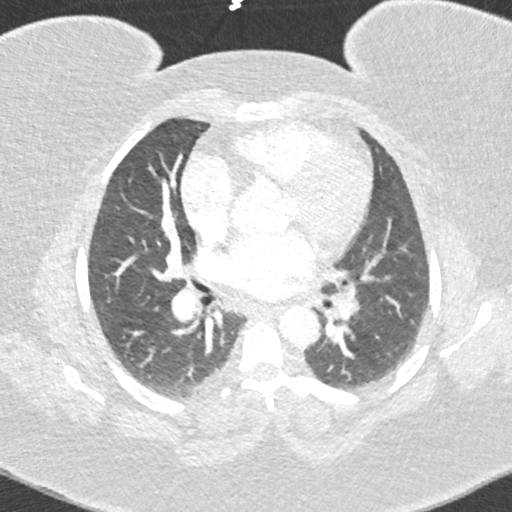
[im 149/269  mediastinal]
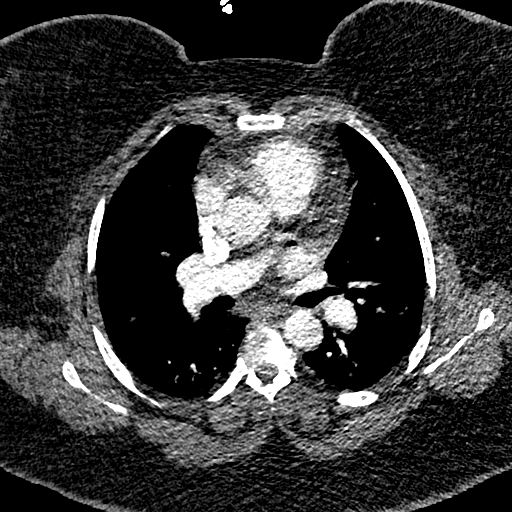
[im 164/269  lung]
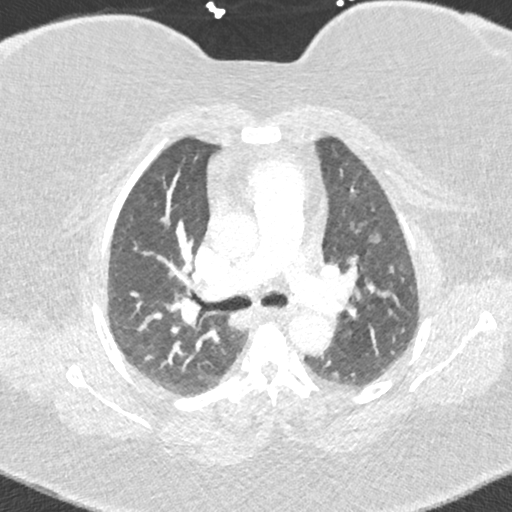
[im 179/269  mediastinal]
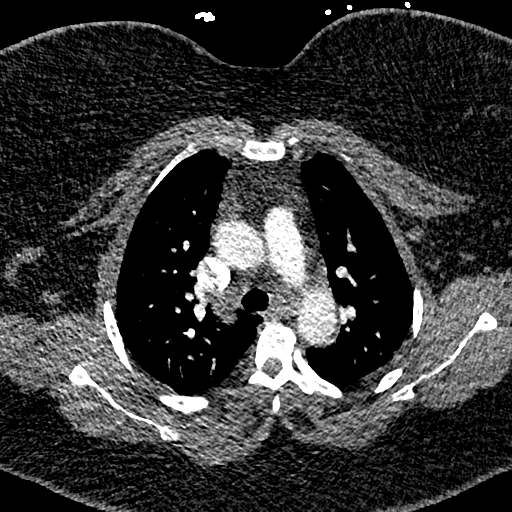
[im 194/269  lung]
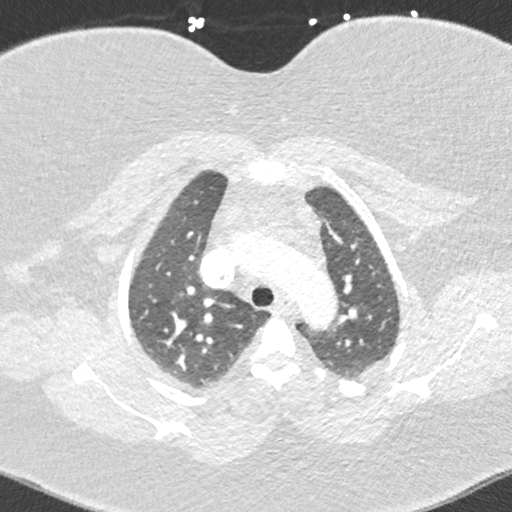
[im 209/269  mediastinal]
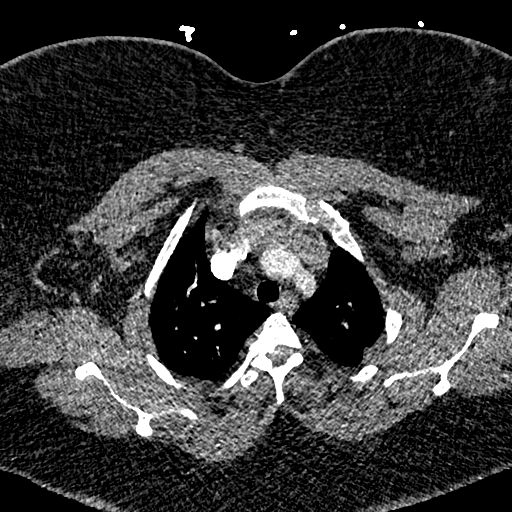
[im 224/269  lung]
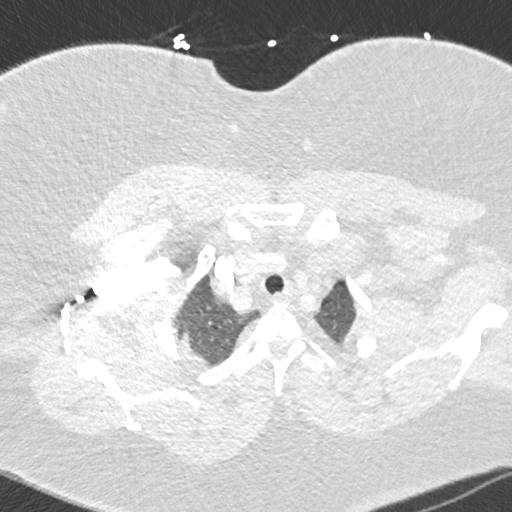
[im 239/269  mediastinal]
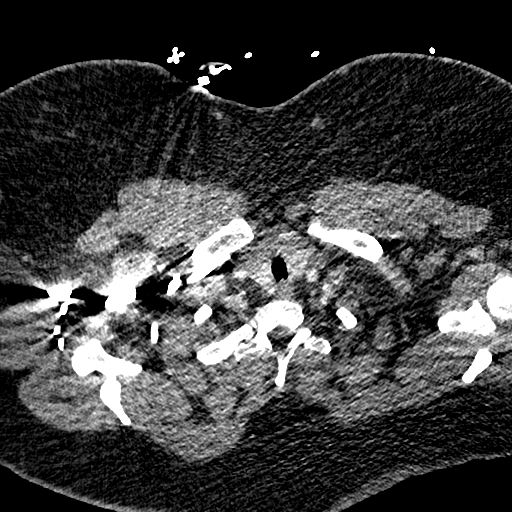
[im 254/269  lung]
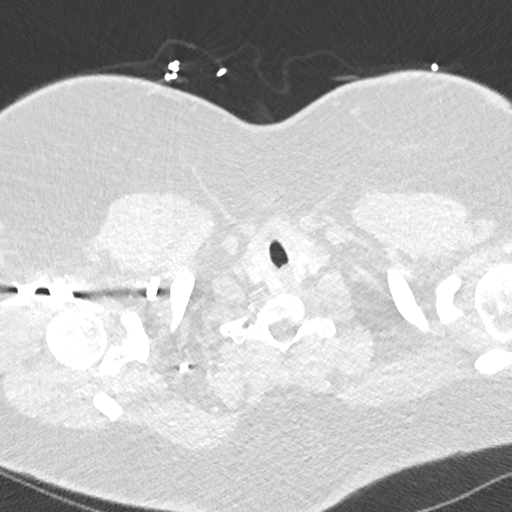

[Series 8: pe 2mm cor · coronal · 0.53mm/px · 1 of 125 slices shown]
[im 63/125  mediastinal]
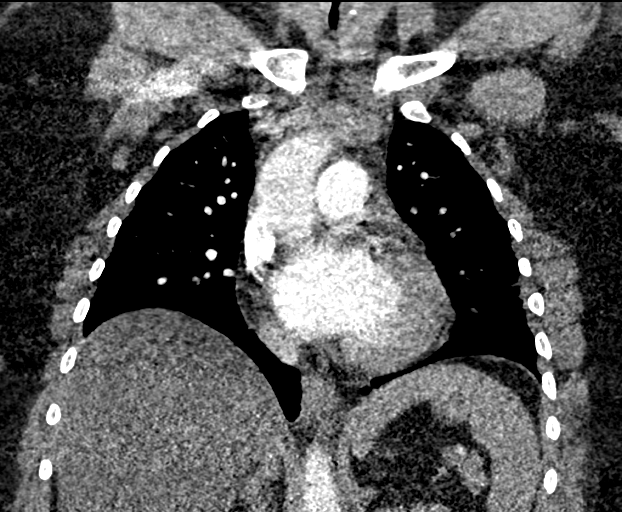

[18 of 36 positions shown; findings below may reference images not displayed]

FINDINGS: Cardiovascular: Examination is technically limited due to body
habitus and technique. There is moderately good opacification of the
central and segmental pulmonary arteries. No filling defects are
identified. No evidence of significant pulmonary embolus. Small
peripheral emboli could be obscured due to technique. Normal caliber
thoracic aorta. Normal heart size. No pericardial effusion.

Mediastinum/Nodes: No significant lymphadenopathy in the chest.
Small esophageal hiatal hernia. Esophagus is decompressed.

Lungs/Pleura: Motion artifact limits examination. There is dependent
atelectasis in the lung bases. No consolidation or edema. Airways
are patent. No pneumothorax. No pleural effusions.

Upper Abdomen: Diffuse fatty infiltration of the liver.

Musculoskeletal: No chest wall abnormality. No acute or significant
osseous findings.

Review of the MIP images confirms the above findings.
IMPRESSION: 1. Technically limited study but no evidence of significant central
pulmonary embolus.
2. No evidence of active pulmonary disease.
3. Small esophageal hiatal hernia.
4. Diffuse fatty infiltration of the liver.

## 2019-10-22 ENCOUNTER — Other Ambulatory Visit: Payer: Self-pay

## 2019-10-22 ENCOUNTER — Emergency Department (HOSPITAL_COMMUNITY)
Admission: EM | Admit: 2019-10-22 | Discharge: 2019-10-23 | Disposition: A | Discharge status: Home / Self Care | Payer: Medicaid Other | Attending: Emergency Medicine | Admitting: Emergency Medicine

## 2019-10-22 ENCOUNTER — Emergency Department (HOSPITAL_COMMUNITY): Payer: Medicaid Other

## 2019-10-22 ENCOUNTER — Encounter (HOSPITAL_COMMUNITY): Payer: Self-pay | Admitting: Emergency Medicine

## 2019-10-22 DIAGNOSIS — I10 Essential (primary) hypertension: Secondary | ICD-10-CM | POA: Insufficient documentation

## 2019-10-22 DIAGNOSIS — E119 Type 2 diabetes mellitus without complications: Secondary | ICD-10-CM | POA: Diagnosis not present

## 2019-10-22 DIAGNOSIS — R079 Chest pain, unspecified: Secondary | ICD-10-CM | POA: Diagnosis not present

## 2019-10-22 NOTE — ED Triage Notes (Signed)
Pt reports right sided 4/10 chest pain that started today.  This only occurs when she bends over.  Hx of blood clots.  No other symptoms including SOB/fever.

## 2019-10-23 ENCOUNTER — Emergency Department (HOSPITAL_COMMUNITY): Payer: Medicaid Other

## 2019-10-23 LAB — BASIC METABOLIC PANEL
Anion gap: 8 (ref 5–15)
BUN: 9 mg/dL (ref 6–20)
CO2: 26 mmol/L (ref 22–32)
Calcium: 9.5 mg/dL (ref 8.9–10.3)
Chloride: 103 mmol/L (ref 98–111)
Creatinine, Ser: 0.71 mg/dL (ref 0.44–1.00)
GFR calc Af Amer: >60 mL/min (ref >60)
GFR calc non Af Amer: >60 mL/min (ref >60)
Glucose, Bld: 225 mg/dL — ABNORMAL HIGH (ref 70–99)
Potassium: 3.9 mmol/L (ref 3.5–5.1)
Sodium: 137 mmol/L (ref 135–145)

## 2019-10-23 LAB — CBC
HCT: 36.4 % (ref 36.0–46.0)
Hemoglobin: 10.9 g/dL — ABNORMAL LOW (ref 12.0–15.0)
MCH: 22.5 pg — ABNORMAL LOW (ref 26.0–34.0)
MCHC: 29.9 g/dL — ABNORMAL LOW (ref 30.0–36.0)
MCV: 75.1 fL — ABNORMAL LOW (ref 80.0–100.0)
Platelets: 368 10*3/uL (ref 150–400)
RBC: 4.85 MIL/uL (ref 3.87–5.11)
RDW: 15.4 % (ref 11.5–15.5)
WBC: 13.3 10*3/uL — ABNORMAL HIGH (ref 4.0–10.5)
nRBC: 0 % (ref 0.0–0.2)

## 2019-10-23 LAB — I-STAT BETA HCG BLOOD, ED (MC, WL, AP ONLY): I-stat hCG, quantitative: <5 m[IU]/mL (ref <5)

## 2019-10-23 LAB — TROPONIN I (HIGH SENSITIVITY)
Troponin I (High Sensitivity): 9 ng/L (ref <18)
Troponin I (High Sensitivity): 9 ng/L (ref <18)

## 2019-10-23 LAB — PREGNANCY, URINE: Preg Test, Ur: NEGATIVE

## 2019-10-23 MED ORDER — IOHEXOL 350 MG/ML SOLN
100.0000 mL | Freq: Once | INTRAVENOUS | Status: AC | PRN
  Administered 2019-10-23: 100 mL via INTRAVENOUS

## 2019-10-23 MED ORDER — RIVAROXABAN (XARELTO) VTE STARTER PACK (15 & 20 MG): ORAL_TABLET | ORAL | 0 refills | Status: DC

## 2019-10-23 MED ORDER — RIVAROXABAN (XARELTO) EDUCATION KIT FOR DVT/PE PATIENTS
PACK | Freq: Once | Status: DC
  Filled 2019-10-23: qty 1

## 2019-10-23 NOTE — ED Provider Notes (Signed)
[facility] EMERGENCY DEPARTMENT Provider Note   CSN: [id number] Arrival date & time: 10/22/19  2222     History Chief Complaint  Patient presents with  . Chest Pain    Virginia Fitzgerald is a 34 y.o. female.  The history is provided by the patient and medical records.  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  States it began yesterday, feels it mostly beneath right breast.  States it is a dull, aching sensation.  Pain worse with movement or bending forward.  She reports history of multiple PE's in the past, is supposed to be on xarelto lifelong but has not had this in about 1 year.  States her prescription lapsed and she was not sure where to get this filled.  She denies any SOB currently, does feel some palpitations intermittently.  No leg swelling, calf pain, trouble walking, hemoptysis, fever, chills, or other cough.  No sick contacts.    Past Medical History:  Diagnosis Date  . Anemia   . Bladder infection 03/2017  . Diabetes mellitus without complication ([place])    WITH 2019 PREGNANCY ONLY  . GERD (gastroesophageal reflux disease)    diet controlled - no meds  . Headache    last one 05/25/17 - otc med  . History of blood transfusion 2012   At [facility]  . History of DVT of lower extremity   . History of hiatal hernia   . MRSA infection    not in EPIC - per patient in 2011  . Pulmonary embolism ([place])   . S/P IVC filter   . SVD (spontaneous vaginal delivery)    x 3  . Thrombocytosis [facility])     Patient Active Problem List   Diagnosis Date Noted  . Colitis 07/27/2017  . Missed abortion 05/29/2017  . Dysplasia of cervix, low grade (CIN 1) 03/28/2017  . 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 Essential hypertension, benign 03/20/2017  . 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 BMI 50.0-59.9, adult ([place]) 02/28/2017    . Iron deficiency anemia 11/14/2011    Past Surgical History:  Procedure Laterality Date  . DILATION AND EVACUATION N/A 05/29/2017   Procedure: DILATATION AND EVACUATION;  Surgeon: Lavonia Drafts, MD;  Location: [facility];  Service: Gynecology;  Laterality: N/A;  . WISDOM TOOTH EXTRACTION       OB History    Gravida  5   Para  3   Term  3   Preterm  0   AB  2   Living  3     SAB  2   TAB  0   Ectopic  0   Multiple      Live Births  3        Obstetric Comments  Largest prior was 7lbs 7oz. No issues with deliveries.         Family History  Problem Relation Age of Onset  . Diabetes Mother   . Hypertension Mother   . Hypertension Maternal Uncle   . Diabetes Maternal Uncle   . Hypertension Maternal Grandmother     Social History   Tobacco Use  . Smoking status: Never Smoker  . Smokeless tobacco: Never Used  . Tobacco comment: never used tobacco  Vaping Use  . Vaping Use: Never used  Substance Use Topics  . Alcohol use: No  Alcohol/week: 0.0 standard drinks  . Drug use: No    Home Medications Prior to Admission medications   Medication Sig Start Date End Date Taking? Authorizing Provider  acetaminophen (TYLENOL) 500 MG tablet Take 1,000 mg by mouth every 8 (eight) hours as needed for moderate pain.   Yes [provider]  ACCU-CHEK FASTCLIX LANCETS MISC 1 Device by Percutaneous route 4 (four) times daily. 03/28/17   Aletha Halim, MD  Blood Glucose Monitoring Suppl (ACCU-CHEK GUIDE) w/Device KIT 1 Device by Does not apply route 4 (four) times daily. 04/10/17   Donnamae Jude, MD  glucose blood (ACCU-CHEK GUIDE) test strip Use as instructed QID 04/10/17   Donnamae Jude, MD  rivaroxaban (XARELTO) 20 MG TABS tablet Take 1 tablet (20 mg total) by mouth daily with supper. Patient not taking: Reported on 10/23/2019 07/19/17   Nat Christen, MD    Allergies    Patient has no known allergies.  Review of Systems   Review of Systems   Cardiovascular: Positive for chest pain.  All other systems reviewed and are negative.   Physical Exam Updated Vital Signs BP (!) 171/110 (BP Location: Left Arm)   Pulse 95   Temp 97.9 F (36.6 C) (Oral)   Resp (!) 24   SpO2 98%   Physical Exam Vitals and nursing note reviewed.  Constitutional:      Appearance: She is well-developed. She is obese.  HENT:     Head: Normocephalic and atraumatic.  Eyes:     Conjunctiva/sclera: Conjunctivae normal.     Pupils: Pupils are equal, round, and reactive to light.  Cardiovascular:     Rate and Rhythm: Normal rate and regular rhythm.     Heart sounds: Normal heart sounds.  Pulmonary:     Effort: Pulmonary effort is normal.     Breath sounds: Normal breath sounds.  Chest:     Comments: Chest wall non-tender to palpation, no bruising or deformities noted Abdominal:     General: Bowel sounds are normal.     Palpations: Abdomen is soft.  Musculoskeletal:        General: Normal range of motion.     Cervical back: Normal range of motion.     Comments: No calf asymmetry, tenderness, or palpable cords, DP pulses intact bilaterally  Skin:    General: Skin is warm and dry.  Neurological:     Mental Status: She is alert and oriented to person, place, and time.     ED Results / Procedures / Treatments   Labs (all labs ordered are listed, but only abnormal results are displayed) Labs Reviewed  BASIC METABOLIC PANEL - Abnormal; Notable for the following components:      Result Value   Glucose, Bld 225 (*)    All other components within normal limits  CBC - Abnormal; Notable for the following components:   WBC 13.3 (*)    Hemoglobin 10.9 (*)    MCV 75.1 (*)    MCH 22.5 (*)    MCHC 29.9 (*)    All other components within normal limits  PREGNANCY, URINE  I-STAT BETA HCG BLOOD, ED (MC, [facility], AP ONLY)  TROPONIN I (HIGH SENSITIVITY)  TROPONIN I (HIGH SENSITIVITY)    EKG EKG Interpretation  Date/Time:  Thursday October 22 2019  23:06:53 EDT Ventricular Rate:  101 PR Interval:  164 QRS Duration: 78 QT Interval:  342 QTC Calculation: 443 R Axis:   -5 Text Interpretation: Sinus tachycardia Minimal voltage criteria for LVH,  may be normal variant ( R in aVL ) Nonspecific T wave abnormality Abnormal ECG When compared with ECG of 02/18/2017, No significant change was found Confirmed by Delora Fuel ([id number]) on 10/23/2019 12:11:41 AM   Radiology DG Chest 2 View  Result Date: 10/23/2019 CLINICAL DATA:  Chest pain EXAM: CHEST - 2 VIEW COMPARISON:  07/19/2017 FINDINGS: Cardiac shadow is at the upper limits of normal in size. The lungs are well aerated bilaterally. No focal infiltrate or effusion is seen. No bony abnormality is noted. IMPRESSION: No active cardiopulmonary disease. Electronically Signed   By: Inez Catalina M.D.   On: 10/23/2019 00:47   CT Angio Chest PE W and/or Wo Contrast  Result Date: 10/23/2019 CLINICAL DATA:  Chest pain, DVT EXAM: CT ANGIOGRAPHY CHEST WITH CONTRAST TECHNIQUE: Multidetector CT imaging of the chest was performed using the standard protocol during bolus administration of intravenous contrast. Multiplanar CT image reconstructions and MIPs were obtained to evaluate the vascular anatomy. CONTRAST:  159m OMNIPAQUE IOHEXOL 350 MG/ML SOLN COMPARISON:  07/19/2017 FINDINGS: Cardiovascular: There is adequate opacification of the pulmonary arterial tree. There is intraluminal septation and abrupt obliteration of the left lower lobe segmental pulmonary arteries, particularly involving the anteromedial segment, in keeping with changes of chronic pulmonary embolus. This appears stable since prior examination. No intraluminal filling defect is identified, however, to suggest acute pulmonary embolism. The central pulmonary arteries are of normal caliber. No significant coronary artery calcification. Global cardiac size is within normal limits. Mild to moderate left ventricular hypertrophy is noted. The thoracic aorta  is normal. Mediastinum/Nodes: No enlarged mediastinal, hilar, or axillary lymph nodes. Shot[place] right axillary adenopathy is stable since prior examination. Thyroid gland, trachea, and esophagus demonstrate no significant findings. Lungs/Pleura: Mild left basilar scar lungs are otherwise clear. No pleural effusion or pneumothorax. Upper Abdomen: Severe hepatic steatosis. This appears similar to prior examination. Musculoskeletal: No acute bone abnormality. 18 mm soft tissue nodule within the left breast is again identified, stable from multiple prior examinations. Review of the MIP images confirms the above findings. IMPRESSION: Chronic pulmonary embolus involving the left lower lobe, stable from multiple prior examinations. No evidence of acute pulmonary embolism. Mild to moderate left ventricular hypertrophy. Electronically Signed   By: AFidela SalisburyMD   On: 10/23/2019 05:48    Procedures Procedures (including critical care time)  Medications Ordered in ED Medications - No data to display  ED Course  I have reviewed the triage vital signs and the nursing notes.  Pertinent labs & imaging results that were available during my care of the patient were reviewed by me and considered in my medical decision making (see chart for details).    MDM Rules/Calculators/A&P  34year old female presenting to the ED for chest pain for the past 24 hours.  States right sided, dull, aching sensation, worse with bending forward and position changes.  No SOB, fever, chills, sweats.  Hx of multiple PE's and has been off xarelto for 1 year as prescription lapsed.  EKG non-ischemic.   Labs reassuring, trop negative x2.  CXR clear.  Given her history, will obtain CTA to ensure no PE.  CTA with chronic PE in LLL, no acute PE. No findings of pulmonary infarct.  Discussed results with patient.  Given her history and prior instructions for lifelong anticoagulation, she is agreeable to re-start xarelto.  No issues or ADR to  xarelto previously.  Pharmacy to review dosage packet with her in ED.  Will refer back to heme/onc for ongoing management,  previously seeing Dr. Marin Olp.  Return here for any new/acute changes.  Final Clinical Impression(s) / ED Diagnoses Final diagnoses:  Chest pain in adult    Rx / DC Orders ED Discharge Orders         Ordered    RIVAROXABAN (XARELTO) VTE STARTER PACK (15 & 20 MG TABLETS)        10/23/19 0609           Larene Pickett, PA-C 36/68/15 [id number]    Delora Fuel, MD 76/15/18 [phone]

## 2019-10-23 NOTE — Discharge Instructions (Addendum)
CT today did not show any new clots. We do recommend that you re-start your anticoagulation and make sure you stay on it. Follow-up with Dr. Marin Olp for ongoing management of this.  Call his office to get appointment scheduled. Return here for any new/acute changes.

## 2019-10-23 NOTE — ED Notes (Signed)
Patient verbalizes understanding of discharge instructions. Opportunity for questioning and answers were provided. Armband removed by staff, pt discharged from ED and ambulated to lobby to return home.   

## 2019-11-09 ENCOUNTER — Inpatient Hospital Stay (HOSPITAL_COMMUNITY)
Admission: EM | Admit: 2019-11-09 | Discharge: 2019-11-12 | DRG: 177 | Disposition: A | Discharge status: Home / Self Care | Payer: Medicaid Other | Attending: Internal Medicine | Admitting: Internal Medicine

## 2019-11-09 ENCOUNTER — Encounter (HOSPITAL_COMMUNITY): Payer: Self-pay | Admitting: Emergency Medicine

## 2019-11-09 DIAGNOSIS — Z86711 Personal history of pulmonary embolism: Secondary | ICD-10-CM

## 2019-11-09 DIAGNOSIS — Z8614 Personal history of Methicillin resistant Staphylococcus aureus infection: Secondary | ICD-10-CM

## 2019-11-09 DIAGNOSIS — R9431 Abnormal electrocardiogram [ECG] [EKG]: Secondary | ICD-10-CM | POA: Diagnosis present

## 2019-11-09 DIAGNOSIS — J1282 Pneumonia due to coronavirus disease 2019: Secondary | ICD-10-CM | POA: Diagnosis present

## 2019-11-09 DIAGNOSIS — R7402 Elevation of levels of lactic acid dehydrogenase (LDH): Secondary | ICD-10-CM | POA: Diagnosis present

## 2019-11-09 DIAGNOSIS — Z95828 Presence of other vascular implants and grafts: Secondary | ICD-10-CM

## 2019-11-09 DIAGNOSIS — Z8249 Family history of ischemic heart disease and other diseases of the circulatory system: Secondary | ICD-10-CM

## 2019-11-09 DIAGNOSIS — J9601 Acute respiratory failure with hypoxia: Secondary | ICD-10-CM

## 2019-11-09 DIAGNOSIS — R7989 Other specified abnormal findings of blood chemistry: Secondary | ICD-10-CM | POA: Diagnosis present

## 2019-11-09 DIAGNOSIS — Z86718 Personal history of other venous thrombosis and embolism: Secondary | ICD-10-CM

## 2019-11-09 DIAGNOSIS — Z7901 Long term (current) use of anticoagulants: Secondary | ICD-10-CM

## 2019-11-09 DIAGNOSIS — Z6841 Body Mass Index (BMI) 40.0 and over, adult: Secondary | ICD-10-CM

## 2019-11-09 DIAGNOSIS — E1165 Type 2 diabetes mellitus with hyperglycemia: Secondary | ICD-10-CM | POA: Diagnosis present

## 2019-11-09 DIAGNOSIS — U071 COVID-19: Principal | ICD-10-CM | POA: Diagnosis present

## 2019-11-09 DIAGNOSIS — I10 Essential (primary) hypertension: Secondary | ICD-10-CM | POA: Diagnosis present

## 2019-11-09 DIAGNOSIS — K219 Gastro-esophageal reflux disease without esophagitis: Secondary | ICD-10-CM | POA: Diagnosis present

## 2019-11-09 DIAGNOSIS — Z833 Family history of diabetes mellitus: Secondary | ICD-10-CM

## 2019-11-09 LAB — RESPIRATORY PANEL BY RT PCR (FLU A&B, COVID)
Influenza A by PCR: NEGATIVE
Influenza B by PCR: NEGATIVE
SARS Coronavirus 2 by RT PCR: POSITIVE — AB

## 2019-11-09 NOTE — ED Triage Notes (Signed)
Pt arrives to ED with c/o body aches and cough that started on Friday 10/08.

## 2019-11-10 ENCOUNTER — Telehealth: Payer: Self-pay | Admitting: Nurse Practitioner

## 2019-11-10 ENCOUNTER — Emergency Department (HOSPITAL_COMMUNITY): Payer: Medicaid Other

## 2019-11-10 ENCOUNTER — Encounter (HOSPITAL_COMMUNITY): Payer: Self-pay | Admitting: Internal Medicine

## 2019-11-10 ENCOUNTER — Other Ambulatory Visit: Payer: Self-pay

## 2019-11-10 DIAGNOSIS — I10 Essential (primary) hypertension: Secondary | ICD-10-CM | POA: Diagnosis present

## 2019-11-10 DIAGNOSIS — J9601 Acute respiratory failure with hypoxia: Secondary | ICD-10-CM | POA: Diagnosis present

## 2019-11-10 DIAGNOSIS — Z8249 Family history of ischemic heart disease and other diseases of the circulatory system: Secondary | ICD-10-CM | POA: Diagnosis not present

## 2019-11-10 DIAGNOSIS — Z95828 Presence of other vascular implants and grafts: Secondary | ICD-10-CM | POA: Diagnosis not present

## 2019-11-10 DIAGNOSIS — R059 Cough, unspecified: Secondary | ICD-10-CM | POA: Diagnosis present

## 2019-11-10 DIAGNOSIS — Z86711 Personal history of pulmonary embolism: Secondary | ICD-10-CM | POA: Diagnosis not present

## 2019-11-10 DIAGNOSIS — J1282 Pneumonia due to coronavirus disease 2019: Secondary | ICD-10-CM | POA: Diagnosis present

## 2019-11-10 DIAGNOSIS — U071 COVID-19: Secondary | ICD-10-CM | POA: Diagnosis present

## 2019-11-10 DIAGNOSIS — Z6841 Body Mass Index (BMI) 40.0 and over, adult: Secondary | ICD-10-CM | POA: Diagnosis not present

## 2019-11-10 DIAGNOSIS — Z833 Family history of diabetes mellitus: Secondary | ICD-10-CM | POA: Diagnosis not present

## 2019-11-10 DIAGNOSIS — R9431 Abnormal electrocardiogram [ECG] [EKG]: Secondary | ICD-10-CM | POA: Diagnosis present

## 2019-11-10 DIAGNOSIS — R7402 Elevation of levels of lactic acid dehydrogenase (LDH): Secondary | ICD-10-CM | POA: Diagnosis present

## 2019-11-10 DIAGNOSIS — E1165 Type 2 diabetes mellitus with hyperglycemia: Secondary | ICD-10-CM | POA: Diagnosis present

## 2019-11-10 DIAGNOSIS — Z8614 Personal history of Methicillin resistant Staphylococcus aureus infection: Secondary | ICD-10-CM | POA: Diagnosis not present

## 2019-11-10 DIAGNOSIS — Z7901 Long term (current) use of anticoagulants: Secondary | ICD-10-CM | POA: Diagnosis not present

## 2019-11-10 DIAGNOSIS — K219 Gastro-esophageal reflux disease without esophagitis: Secondary | ICD-10-CM | POA: Diagnosis present

## 2019-11-10 DIAGNOSIS — R7989 Other specified abnormal findings of blood chemistry: Secondary | ICD-10-CM | POA: Diagnosis present

## 2019-11-10 DIAGNOSIS — Z86718 Personal history of other venous thrombosis and embolism: Secondary | ICD-10-CM | POA: Diagnosis not present

## 2019-11-10 LAB — COMPREHENSIVE METABOLIC PANEL
ALT: 60 U/L — ABNORMAL HIGH (ref 0–44)
AST: 51 U/L — ABNORMAL HIGH (ref 15–41)
Albumin: 3.4 g/dL — ABNORMAL LOW (ref 3.5–5.0)
Alkaline Phosphatase: 100 U/L (ref 38–126)
Anion gap: 13 (ref 5–15)
BUN: 8 mg/dL (ref 6–20)
CO2: 23 mmol/L (ref 22–32)
Calcium: 8.7 mg/dL — ABNORMAL LOW (ref 8.9–10.3)
Chloride: 100 mmol/L (ref 98–111)
Creatinine, Ser: 0.89 mg/dL (ref 0.44–1.00)
GFR, Estimated: >60 mL/min (ref >60)
Glucose, Bld: 247 mg/dL — ABNORMAL HIGH (ref 70–99)
Potassium: 3.7 mmol/L (ref 3.5–5.1)
Sodium: 136 mmol/L (ref 135–145)
Total Bilirubin: 0.3 mg/dL (ref 0.3–1.2)
Total Protein: 7.7 g/dL (ref 6.5–8.1)

## 2019-11-10 LAB — GLUCOSE, CAPILLARY
Glucose-Capillary: 385 mg/dL — ABNORMAL HIGH (ref 70–99)
Glucose-Capillary: 452 mg/dL — ABNORMAL HIGH (ref 70–99)
Glucose-Capillary: 383 mg/dL — ABNORMAL HIGH (ref 70–99)

## 2019-11-10 LAB — CBC WITH DIFFERENTIAL/PLATELET
Abs Immature Granulocytes: 0.05 10*3/uL (ref 0.00–0.07)
Basophils Absolute: 0 10*3/uL (ref 0.0–0.1)
Basophils Relative: 0 %
Eosinophils Absolute: 0.3 10*3/uL (ref 0.0–0.5)
Eosinophils Relative: 3 %
HCT: 38.4 % (ref 36.0–46.0)
Hemoglobin: 11.6 g/dL — ABNORMAL LOW (ref 12.0–15.0)
Immature Granulocytes: 1 %
Lymphocytes Relative: 14 %
Lymphs Abs: 1.2 10*3/uL (ref 0.7–4.0)
MCH: 22.2 pg — ABNORMAL LOW (ref 26.0–34.0)
MCHC: 30.2 g/dL (ref 30.0–36.0)
MCV: 73.6 fL — ABNORMAL LOW (ref 80.0–100.0)
Monocytes Absolute: 0.4 10*3/uL (ref 0.1–1.0)
Monocytes Relative: 4 %
Neutro Abs: 7 10*3/uL (ref 1.7–7.7)
Neutrophils Relative %: 78 %
Platelets: 382 10*3/uL (ref 150–400)
RBC: 5.22 MIL/uL — ABNORMAL HIGH (ref 3.87–5.11)
RDW: 15.7 % — ABNORMAL HIGH (ref 11.5–15.5)
WBC: 8.9 10*3/uL (ref 4.0–10.5)
nRBC: 0 % (ref 0.0–0.2)

## 2019-11-10 LAB — HEMOGLOBIN A1C
Hgb A1c MFr Bld: 9.6 % — ABNORMAL HIGH (ref 4.8–5.6)
Mean Plasma Glucose: 228.82 mg/dL

## 2019-11-10 LAB — LACTATE DEHYDROGENASE: LDH: 219 U/L — ABNORMAL HIGH (ref 98–192)

## 2019-11-10 LAB — MAGNESIUM: Magnesium: 1.7 mg/dL (ref 1.7–2.4)

## 2019-11-10 LAB — TRIGLYCERIDES: Triglycerides: 196 mg/dL — ABNORMAL HIGH (ref <150)

## 2019-11-10 LAB — TROPONIN I (HIGH SENSITIVITY)
Troponin I (High Sensitivity): 11 ng/L (ref <18)
Troponin I (High Sensitivity): 10 ng/L (ref <18)

## 2019-11-10 LAB — FERRITIN: Ferritin: 116 ng/mL (ref 11–307)

## 2019-11-10 LAB — C-REACTIVE PROTEIN: CRP: 4.7 mg/dL — ABNORMAL HIGH (ref <1.0)

## 2019-11-10 LAB — HCG, SERUM, QUALITATIVE: Preg, Serum: NEGATIVE

## 2019-11-10 LAB — LACTIC ACID, PLASMA
Lactic Acid, Venous: 1.2 mmol/L (ref 0.5–1.9)
Lactic Acid, Venous: 1.2 mmol/L (ref 0.5–1.9)

## 2019-11-10 LAB — FIBRINOGEN: Fibrinogen: 497 mg/dL — ABNORMAL HIGH (ref 210–475)

## 2019-11-10 LAB — D-DIMER, QUANTITATIVE: D-Dimer, Quant: <0.27 ug/mL-FEU (ref 0.00–0.50)

## 2019-11-10 LAB — PROCALCITONIN: Procalcitonin: 0.15 ng/mL

## 2019-11-10 MED ORDER — RIVAROXABAN (XARELTO) VTE STARTER PACK (15 & 20 MG): 20.0000 mg | ORAL_TABLET | Freq: Every day | ORAL | Status: DC

## 2019-11-10 MED ORDER — RIVAROXABAN 15 MG PO TABS
15.0000 mg | ORAL_TABLET | Freq: Two times a day (BID) | ORAL | Status: DC
  Administered 2019-11-10: 15 mg via ORAL
  Filled 2019-11-10 (×2): qty 1

## 2019-11-10 MED ORDER — ACETAMINOPHEN 325 MG PO TABS
650.0000 mg | ORAL_TABLET | Freq: Four times a day (QID) | ORAL | Status: DC | PRN
  Administered 2019-11-10 – 2019-11-12 (×3): 650 mg via ORAL
  Filled 2019-11-10 (×3): qty 2

## 2019-11-10 MED ORDER — RIVAROXABAN 20 MG PO TABS: 20.0000 mg | ORAL_TABLET | Freq: Every day | ORAL | Status: DC

## 2019-11-10 MED ORDER — ACETAMINOPHEN 500 MG PO TABS
1000.0000 mg | ORAL_TABLET | Freq: Once | ORAL | Status: AC
  Administered 2019-11-10: 1000 mg via ORAL
  Filled 2019-11-10: qty 2

## 2019-11-10 MED ORDER — INSULIN ASPART 100 UNIT/ML ~~LOC~~ SOLN
0.0000 [IU] | Freq: Three times a day (TID) | SUBCUTANEOUS | Status: DC
  Administered 2019-11-10 – 2019-11-11 (×2): 20 [IU] via SUBCUTANEOUS

## 2019-11-10 MED ORDER — INSULIN ASPART 100 UNIT/ML ~~LOC~~ SOLN: 0.0000 [IU] | Freq: Every day | SUBCUTANEOUS | Status: DC

## 2019-11-10 MED ORDER — RIVAROXABAN 15 MG PO TABS
15.0000 mg | ORAL_TABLET | Freq: Two times a day (BID) | ORAL | Status: DC
  Administered 2019-11-10 – 2019-11-12 (×4): 15 mg via ORAL
  Filled 2019-11-10 (×4): qty 1

## 2019-11-10 MED ORDER — INSULIN ASPART 100 UNIT/ML ~~LOC~~ SOLN
0.0000 [IU] | Freq: Three times a day (TID) | SUBCUTANEOUS | Status: DC
  Administered 2019-11-10: 9 [IU] via SUBCUTANEOUS

## 2019-11-10 MED ORDER — METHYLPREDNISOLONE SODIUM SUCC 125 MG IJ SOLR
125.0000 mg | Freq: Two times a day (BID) | INTRAMUSCULAR | Status: AC
  Administered 2019-11-10: 125 mg via INTRAVENOUS
  Filled 2019-11-10: qty 2

## 2019-11-10 MED ORDER — POLYETHYLENE GLYCOL 3350 17 G PO PACK: 17.0000 g | PACK | Freq: Every day | ORAL | Status: DC | PRN

## 2019-11-10 MED ORDER — SODIUM CHLORIDE 0.9 % IV SOLN
200.0000 mg | Freq: Once | INTRAVENOUS | Status: AC
  Administered 2019-11-10: 200 mg via INTRAVENOUS
  Filled 2019-11-10: qty 40

## 2019-11-10 MED ORDER — INSULIN ASPART 100 UNIT/ML ~~LOC~~ SOLN
0.0000 [IU] | Freq: Every day | SUBCUTANEOUS | Status: DC
  Administered 2019-11-10: 5 [IU] via SUBCUTANEOUS

## 2019-11-10 MED ORDER — DEXAMETHASONE SODIUM PHOSPHATE 10 MG/ML IJ SOLN
6.0000 mg | INTRAMUSCULAR | Status: DC
  Administered 2019-11-11 – 2019-11-12 (×2): 6 mg via INTRAVENOUS
  Filled 2019-11-10 (×2): qty 1

## 2019-11-10 MED ORDER — INSULIN GLARGINE 100 UNIT/ML ~~LOC~~ SOLN
10.0000 [IU] | Freq: Every day | SUBCUTANEOUS | Status: DC
  Administered 2019-11-10: 10 [IU] via SUBCUTANEOUS
  Filled 2019-11-10 (×2): qty 0.1

## 2019-11-10 MED ORDER — ASCORBIC ACID 500 MG PO TABS
500.0000 mg | ORAL_TABLET | Freq: Every day | ORAL | Status: DC
  Administered 2019-11-10 – 2019-11-12 (×3): 500 mg via ORAL
  Filled 2019-11-10 (×4): qty 1

## 2019-11-10 MED ORDER — SODIUM CHLORIDE 0.9 % IV SOLN
100.0000 mg | Freq: Every day | INTRAVENOUS | Status: DC
  Administered 2019-11-11 – 2019-11-12 (×2): 100 mg via INTRAVENOUS
  Filled 2019-11-10 (×2): qty 20

## 2019-11-10 MED ORDER — ZINC SULFATE 220 (50 ZN) MG PO CAPS
220.0000 mg | ORAL_CAPSULE | Freq: Every day | ORAL | Status: DC
  Administered 2019-11-10 – 2019-11-12 (×3): 220 mg via ORAL
  Filled 2019-11-10 (×4): qty 1

## 2019-11-10 MED ORDER — METHYLPREDNISOLONE SODIUM SUCC 125 MG IJ SOLR
125.0000 mg | Freq: Once | INTRAMUSCULAR | Status: AC
  Administered 2019-11-10: 125 mg via INTRAVENOUS

## 2019-11-10 NOTE — H&P (Addendum)
Date: 11/10/2019               Patient Name:  Virginia Fitzgerald MRN: [id number]  DOB: 1985-10-11 Age / Sex: 34 y.o., female   PCP: Patient, No Pcp Per         Medical Service: Internal Medicine Teaching Service         Attending Physician: Dr. Aldine Contes, MD    First Contact: Dr. Wynetta Emery Pager: [phone]  Second Contact: Dr. Court Joy Pager: [phone]       After Hours (After 5p/  First Contact Pager: [phone]  weekends / holidays): Second Contact Pager: [phone]   Chief Complaint: Cough, bodyache and leg pain  History of Present Illness: 34 y/o with PMHx of (per chart) HTN, DM, IDA, recurrent DVT and PE Xarelto and s/p IVC filter, missed abortion,  who presented with cough. Her symptoms started 5 days ago with fever and some chills, body ache and intermittent productive cough as well as left leg pain. She does not remember if this is the same leg where she had DVT before. She denies chest pain but mentions that she had some chest discomfort in middle of her chest when laying flat. She states that she developed SOB when she arrived in ED. No GI symptoms. She lives with her boyfriend and her kids and they are doing fine and are asymptomatic. She denies any other sick contact. She is not vaccinated against COVID-19.   She does take xarelto for Hx of recurrent DVT and PE (restarted about a month ago in ED after she was off of it for a year). She does not take any other medications.  She mentions that she does not know about having diabetes but endorses that her blood pressure has been recently high. She does not have a PCP but is willing to get one.  ED course: her COVID-19 test came back positive. D-dimer negative. She was saturating well on 1 li of  but with ambulation, her O2 sat dropped to 80s.  She was started on Remdesivir and received one dose of Solumedrol and IMTS consulted for admission.  Meds:   Xarelto 20 mg QD (Not on any other medications)  No outpatient medications  have been marked as taking for the 11/09/19 encounter [facility] Encounter).    Allergies: Allergies as of 11/09/2019  . (No Known Allergies)   Past Medical History:  Diagnosis Date  . Anemia   . Bladder infection 03/2017  . Diabetes mellitus without complication ([place])    WITH 2019 PREGNANCY ONLY  . GERD (gastroesophageal reflux disease)    diet controlled - no meds  . Headache    last one 05/25/17 - otc med  . History of blood transfusion 2012   At [facility]  . History of DVT of lower extremity   . History of hiatal hernia   . MRSA infection    not in EPIC - per patient in 2011  . Pulmonary embolism ([place])   . S/P IVC filter   . SVD (spontaneous vaginal delivery)    x 3  . Thrombocytosis     Family History:  DM and HTN in mother  Social History:  She lives with her kids and her boyfriend, she works in a [employer]. No Hx of smoking or tobacco use, np alcohol use, no illicit drug use  Review of Systems: A complete ROS was negative except as per HPI.   Physical Exam: Blood pressure 106/70, pulse 100, temperature 98.5 F (36.9  C), temperature source Oral, resp. rate 20, height 5\' 3"  (1.6 m), weight 131.5 kg, SpO2 98 %, unknown if currently breastfeeding.  Physical Exam Constitutional:      General: She is not in acute distress.    Appearance: She is obese. She is not ill-appearing or diaphoretic.  HENT:     Head: Normocephalic and atraumatic.  Eyes:     Extraocular Movements: Extraocular movements intact.  Cardiovascular:     Rate and Rhythm: Regular rhythm. Tachycardia present.     Pulses: Normal pulses.     Heart sounds: Normal heart sounds. No murmur heard.   Pulmonary:     Effort: Pulmonary effort is normal. No respiratory distress.     Breath sounds: Rales present. No rhonchi.  Abdominal:     Palpations: Abdomen is soft.     Tenderness: There is no abdominal tenderness.  Musculoskeletal:        General: No swelling or tenderness.     Right lower leg: No edema.       Left lower leg: No edema.  Skin:    General: Skin is warm and dry.     Findings: No erythema.  Neurological:     General: No focal deficit present.     Mental Status: She is oriented to person, place, and time.     Motor: No weakness.  Psychiatric:        Mood and Affect: Mood normal.        Behavior: Behavior normal.        Thought Content: Thought content normal.        Judgment: Judgment normal.     EKG: personally reviewed my interpretation is sinus tachycardia with prolonged QTc CXR: personally reviewed my interpretation is patchy airspace opacity at lung bases and right upper lobe, suspicious for multifocal pneumonia, with some mild vascular congestion.  Assessment & Plan by Problem: Active Problems:   COVID-19 virus infection  34 y/o with PMHx of (per chart) HTN, DM, IDA, recurrent DVT and PE Xarelto and s/p IVC filter, missed abortion,  who presented with cough and left leg pain, fever and some chills, body ache and intermittent productive cough. Her COVID-19 test came back positive. D-dimer negative. She was saturating well on 1 li of [place] but with ambulation, her O2 sat dropped to 80s. She was started on Remdesivir and received one dose of Solumedrol and IMTS consulted for admission.   COVID-10 PNA: Patient presented with productive cough, fever and body ache for 5 days. Tested positive for COVID on arrival this admision 10/12 Currently on 2 li O2 via [place] CXR with patchy airspace opacity at lung bases and right upper lobe, suspicious for multifocal pneumonia, with some mild vascular congestion. Nl LA and no leukocytosis or leukopenia. No lymphopenia  Mildly elevation of LFT AT/ALT 51/61. OK to start Remdesivir.  Trop negative 11>10 Procal elevated at 0.15 D dimer<0.27, Fibrinogen mildy elevated at 497 CRP elevated at 4.7 [facility] pending.  Inflammatory marker: LDH elevated at 219, Ferritin nl at 116.  -Supplemental O2 to keep sat>92% -Continue Remdisivir. Started  in ED 10>12.  -Status post 125 mg of Solumedrol at 7 AM, will give another dose at 7 pm and switch to Decadron 6 mg IV for 9 more days tomorrow -Zinc and Vitamin C -Airborne and contact precautions -Continue home Xarelto -CMP daily inflammatory marker daily -Of note, she was seen in ED 10/23/2019 for SOB, and CT angio showed chornic PE and her xarelto resumed. How ever,  there is no COVID test in the chart for that ED admission and pt endorses that she was not tested for COVID that day or prior to that.  DM type 2: Per chart review.  She is hyperglycemic 147<225<112 Pt is not aware of having DM and not on antiglycemic regimen at home. Checking HbA1c and placing order for SSI being on steroid.  -HbA1c -CBG monitoring being on Steroid -Sensitive SSI   Hx of recurrent PE.  On Xarelto at home and s/p IVC filter. She was supposed to be on life long AC but did not have a prescription for about a month before until she was seen in ED 9/24 when CT angio showed chornic PE and Xarelto resumed. She reports compliance to Xarelto since then but missed yesterday dose coming to ED.  She reports intermittent missed dose of Xarelto at home -Although patient presented with left leg pain, D-Dimer was negative x 2 so I will not order doppler.  -Continue home Xarelto 15 mg BID for 3 days then 20 mg QD (restarted Xarelto 9/24) -Cardiac monitoring  Prolong QTC: Corrected QT: 518 Avoiding QTC prolonging meds and replacing electrolytes as needed. Cardiac monitoring  Dispo: Admit patient to Inpatient with expected length of stay greater than 2 midnights.  SignedDewayne Hatch, MD 11/10/2019, 11:04 AM  Pager: [phone] After 5pm on weekdays and 1pm on weekends: On Call pager: [phone]

## 2019-11-10 NOTE — Plan of Care (Signed)
  Problem: Education: Goal: Knowledge of risk factors and measures for prevention of condition will improve Outcome: Progressing   Problem: Coping: Goal: Psychosocial and spiritual needs will be supported Outcome: Progressing   Problem: Respiratory: Goal: Will maintain a patent airway Outcome: Progressing   

## 2019-11-10 NOTE — Progress Notes (Signed)
Paged by RN about BG is elevated. CBG (last 3)  Recent Labs    11/10/19 1149 11/10/19 1608  GLUCAP 385* 452*  Increasing SSI to resistant and adding 10 u of lantus at bedtime as patient is on IV steroid.

## 2019-11-10 NOTE — Plan of Care (Signed)
Paged dr Myrtie Hawk regarding CBG [phone]

## 2019-11-10 NOTE — ED Notes (Signed)
Lab to add on magnesium  

## 2019-11-10 NOTE — ED Provider Notes (Signed)
Care assumed from [place], please see her note for full details, but in brief Virginia Fitzgerald is a 34 y.o. female presents with 5 days of cough associated with chills, body aches and chest discomfort.  Tested positive for Covid today, unvaccinated.  Does have recent history of PE on Xarelto s/p IVC filter.  Patient found to be hypoxic down to 82% with ambulation, placed on 1 L nasal cannula for comfort, O2 sats in 90s at rest.  Chest x-ray with evidence of Covid pneumonia.  Lab work shows hyperglycemia, no other significant electrolyte derangements, elevation of multiple inflammatory markers.  Negative D-dimer.  Pregnancy test is pending.  Patient has been given Tylenol and remdesivir.  Will require admission for Covid hypoxia.  BP (!) 142/79   Pulse (!) 125   Temp 99.1 F (37.3 C) (Oral)   Resp (!) 25   Ht 5\' 3"  (1.6 m)   Wt 131.5 kg   SpO2 100%   BMI 51.37 kg/m    ED Course/Procedures   Labs Reviewed  RESPIRATORY PANEL BY RT PCR (FLU A&B, COVID) - Abnormal; Notable for the following components:      Result Value   SARS Coronavirus 2 by RT PCR POSITIVE (*)    All other components within normal limits  CBC WITH DIFFERENTIAL/PLATELET - Abnormal; Notable for the following components:   RBC 5.22 (*)    Hemoglobin 11.6 (*)    MCV 73.6 (*)    MCH 22.2 (*)    RDW 15.7 (*)    All other components within normal limits  COMPREHENSIVE METABOLIC PANEL - Abnormal; Notable for the following components:   Glucose, Bld 247 (*)    Calcium 8.7 (*)    Albumin 3.4 (*)    AST 51 (*)    ALT 60 (*)    All other components within normal limits  LACTATE DEHYDROGENASE - Abnormal; Notable for the following components:   LDH 219 (*)    All other components within normal limits  TRIGLYCERIDES - Abnormal; Notable for the following components:   Triglycerides 196 (*)    All other components within normal limits  FIBRINOGEN - Abnormal; Notable for the following components:   Fibrinogen  497 (*)    All other components within normal limits  C-REACTIVE PROTEIN - Abnormal; Notable for the following components:   CRP 4.7 (*)    All other components within normal limits  CULTURE, BLOOD (ROUTINE X 2)  CULTURE, BLOOD (ROUTINE X 2)  LACTIC ACID, PLASMA  D-DIMER, QUANTITATIVE (NOT AT [facility])  FERRITIN  LACTIC ACID, PLASMA  PROCALCITONIN  HCG, SERUM, QUALITATIVE  HCG, SERUM, QUALITATIVE  MAGNESIUM  I-STAT BETA HCG BLOOD, ED ([facility], [facility], AP ONLY)  TROPONIN I (HIGH SENSITIVITY)  TROPONIN I (HIGH SENSITIVITY)   DG Chest Port 1 View  Result Date: 11/10/2019 CLINICAL DATA:  Body aches and cough. EXAM: PORTABLE CHEST 1 VIEW COMPARISON:  10/23/2019 FINDINGS: 0504 hours. Low volumes. The cardio pericardial silhouette is borderline enlarged. Patchy airspace disease noted right upper lobe and both lung bases. There is pulmonary vascular congestion without overt pulmonary edema. No pleural effusion. The visualized bony structures of the thorax show no acute abnormality. IMPRESSION: 1. Patchy airspace opacity right upper lobe and both lung bases, suspicious for multifocal pneumonia. 2. Borderline cardiomegaly with vascular congestion. Electronically Signed   By: Misty Stanley M.D.   On: 11/10/2019 05:35     Procedures  MDM   Pregnancy test is negative.  Will treat with  steroids as well for Covid pneumonia and hypoxia.  Consult placed for admission.  Case discussed with internal medicine teaching service who will see and admit the patient     Janet Berlin 11/10/19 5009    Merryl Hacker, MD 11/10/19 [phone]

## 2019-11-10 NOTE — Telephone Encounter (Signed)
Chart review completed for evaluation of eligibility for monoclonal antibody infusion treatment for COVID-19.   Chart review reveals patient is currently admitted to the hospital for acute respiratory failure and therefore would not qualify for outpatient treatment at this time.

## 2019-11-10 NOTE — ED Notes (Signed)
PT c/o of being hot. Temp checked and charted. Fan ordered and at bedside.

## 2019-11-10 NOTE — Plan of Care (Signed)
  Problem: Education: Goal: Knowledge of risk factors and measures for prevention of condition will improve Outcome: Progressing   Problem: Respiratory: Goal: Will maintain a patent airway Outcome: Progressing   

## 2019-11-10 NOTE — Hospital Course (Addendum)
#  COVID Patient presented with complaints of cough and shortness of breath found to have fever and positive for COVID-19 infection. Inflammatory markers mildly elevated. Patient initially required 1-2L O2 via nasal cannula to maintain oxygen saturation, however this was discontinued shortly after admission. Patient saturating well on room air the morning after admission. She reported continued improved in shortness of breath and manageable cough with Robitussin. She received one dose of IV solumedrol followed by two doses of decadron. She received three doses of remdesivir. She was discharged with 7 tablets of decadron to complete her 10-day course and Robitussin. She did not require oxygen upon discharge. We discussed the importance of quarantining following discharge and that she should still get the COVID-19 vaccination. She understood and agreed with the plan.  #T2DM Patient noted to be hyperglycemic to 300s upon admission. She denied prior knowledge of having a diagnosis of diabetes and therefore denied any prior antiglycemic regimen. Hemoglobin A1c 9.6. Diabetes coordinator consulted and recommended 20 units of Levemir twice daily with resistant sliding scale insulin in the setting of her dexamethasone use. She was instructed on how to check her blood sugars and administer insulin. She received the necessary supplies and medications prior to discharge and reported having no further questions. We discussed the importance of following up closely with the [facility] for management of her condition.  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 Patient presented to ED two weeks ago and reportedly not taking prescribed Xarelto. She was restarted on this medication at the time and titrated up to 15mg  twice daily. She was continued on this dosing regimen throughout her hospitalization with plan to transition to 20mg  daily on 10/15. She was discharged on  10/14 and instructed of this plan. Thirty, 20mg  tablets of Xarelto were brought to her room prior to discharge. She was informed of the importance of attending her PCP visit with the [facility] for further medication refills.

## 2019-11-10 NOTE — ED Provider Notes (Signed)
[facility] EMERGENCY DEPARTMENT Provider Note   CSN: [id number] Arrival date & time: 11/09/19  1344     History Chief Complaint  Patient presents with  . Generalized Body Aches  . Cough    Virginia Fitzgerald is a 34 y.o. 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 Patient states she has felt ill with chills, body aches, dyspnea, cough intermittently productive of phlegm sputum, and chest discomfort. Chest discomfort is central, constant, no alleviating/aggravating factors. She has not received COVID 19 vaccines. She denies fever, N/V/D, abdominal pain,  leg pain/swelling, hemoptysis, recent surgery/trauma, recent long travel, hormone use, or hx of cancer. She typically takes her xarelto as prescribed but has intermittently missed some doses.   HPI     Past Medical History:  Diagnosis Date  . Anemia   . Bladder infection 03/2017  . Diabetes mellitus without complication ([place])    WITH 2019 PREGNANCY ONLY  . GERD (gastroesophageal reflux disease)    diet controlled - no meds  . Headache    last one 05/25/17 - otc med  . History of blood transfusion 2012   At [facility]  . History of DVT of lower extremity   . History of hiatal hernia   . MRSA infection    not in EPIC - per patient in 2011  . Pulmonary embolism ([place])   . S/P IVC filter   . SVD (spontaneous vaginal delivery)    x 3  . Thrombocytosis     Patient Active Problem List   Diagnosis Date Noted  . Colitis 07/27/2017  . Missed abortion 05/29/2017  . Dysplasia of cervix, low grade (CIN 1) 03/28/2017  . Diabetes type 2, 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 ([place]) 03/28/2017  . Essential hypertension, benign 03/20/2017  . Recurrent pulmonary embolism ([place]) 03/20/2017  . BMI 50.0-59.9, adult ([place]) 02/28/2017  . Iron deficiency anemia 11/14/2011    Past Surgical History:  Procedure Laterality Date   . DILATION AND EVACUATION N/A 05/29/2017   Procedure: DILATATION AND EVACUATION;  Surgeon: Lavonia Drafts, MD;  Location: [facility];  Service: Gynecology;  Laterality: N/A;  . WISDOM TOOTH EXTRACTION       OB History    Gravida  5   Para  3   Term  3   Preterm  0   AB  2   Living  3     SAB  2   TAB  0   Ectopic  0   Multiple      Live Births  3        Obstetric Comments  Largest prior was 7lbs 7oz. No issues with deliveries.         Family History  Problem Relation Age of Onset  . Diabetes Mother   . Hypertension Mother   . Hypertension Maternal Uncle   . Diabetes Maternal Uncle   . Hypertension Maternal Grandmother     Social History   Tobacco Use  . Smoking status: Never Smoker  . Smokeless tobacco: Never Used  . Tobacco comment: never used tobacco  Vaping Use  . Vaping Use: Never used  Substance Use Topics  . Alcohol use: No    Alcohol/week: 0.0 standard drinks  . Drug use: No    Home Medications Prior to Admission medications   Medication Sig Start Date End Date Taking? Authorizing Provider  ACCU-CHEK FASTCLIX LANCETS  MISC 1 Device by Percutaneous route 4 (four) times daily. 03/28/17   Aletha Halim, MD  acetaminophen (TYLENOL) 500 MG tablet Take 1,000 mg by mouth every 8 (eight) hours as needed for moderate pain.    [provider]  Blood Glucose Monitoring Suppl (ACCU-CHEK GUIDE) w/Device KIT 1 Device by Does not apply route 4 (four) times daily. 04/10/17   Donnamae Jude, MD  glucose blood (ACCU-CHEK GUIDE) test strip Use as instructed QID 04/10/17   Donnamae Jude, MD  RIVAROXABAN [employer]) VTE STARTER PACK (15 & 20 MG TABLETS) Follow package directions: Take one 85m tablet by mouth twice a day. On day 22, switch to one 210mtablet once a day. Take with food. 10/23/19   SaLarene PickettPA-C    Allergies    Patient has no known allergies.  Review of Systems   Review of Systems  Constitutional: Positive for chills and  fatigue. Negative for fever.  Respiratory: Positive for cough and shortness of breath.   Cardiovascular: Positive for chest pain.  Gastrointestinal: Negative for abdominal pain, blood in stool, constipation, diarrhea, nausea and vomiting.  Genitourinary: Negative for dysuria.  Musculoskeletal: Positive for myalgias (generalized).  Neurological: Negative for dizziness and syncope.  All other systems reviewed and are negative.   Physical Exam Updated Vital Signs BP (!) 172/95 (BP Location: Right Arm)   Pulse (!) 132   Temp 99.1 F (37.3 C) (Oral)   Resp (!) 22   Ht '5\' 3"'  (1.6 m)   Wt 131.5 kg   SpO2 99%   BMI 51.37 kg/m   Physical Exam Vitals and nursing note reviewed.  Constitutional:      General: She is not in acute distress.    Appearance: She is well-developed. She is obese. She is not toxic-appearing.  HENT:     Head: Normocephalic and atraumatic.  Eyes:     General:        Right eye: No discharge.        Left eye: No discharge.     Conjunctiva/sclera: Conjunctivae normal.  Cardiovascular:     Rate and Rhythm: Regular rhythm. Tachycardia present.  Pulmonary:     Effort: No respiratory distress.     Breath sounds: Rhonchi (scattered) present. No wheezing or rales.     Comments: SpO2 94% on RA @ rest, desaturated to 87% on RA with ambulation. Placed on 1L via .  Abdominal:     General: There is no distension.     Palpations: Abdomen is soft.     Tenderness: There is no abdominal tenderness.  Musculoskeletal:     Cervical back: Neck supple.     Right lower leg: No edema.     Left lower leg: No edema.  Skin:    General: Skin is warm and dry.     Findings: No rash.  Neurological:     Mental Status: She is alert.     Comments: Clear speech.   Psychiatric:        Behavior: Behavior normal.    ED Results / Procedures / Treatments   Labs (all labs ordered are listed, but only abnormal results are displayed) Labs Reviewed  RESPIRATORY PANEL BY RT PCR (FLU  A&B, COVID) - Abnormal; Notable for the following components:      Result Value   SARS Coronavirus 2 by RT PCR POSITIVE (*)    All other components within normal limits  CULTURE, BLOOD (ROUTINE X 2)  CULTURE, BLOOD (ROUTINE X 2)  LACTIC ACID, PLASMA  LACTIC ACID, PLASMA  CBC WITH DIFFERENTIAL/PLATELET  COMPREHENSIVE METABOLIC PANEL  D-DIMER, QUANTITATIVE (NOT AT [facility])  PROCALCITONIN  LACTATE DEHYDROGENASE  FERRITIN  TRIGLYCERIDES  FIBRINOGEN  C-REACTIVE PROTEIN  I-STAT BETA HCG BLOOD, ED ([facility], [facility], [facility] ONLY)  TROPONIN I (HIGH SENSITIVITY)    EKG None  Radiology DG Chest Port 1 View  Result Date: 11/10/2019 CLINICAL DATA:  Body aches and cough. EXAM: PORTABLE CHEST 1 VIEW COMPARISON:  10/23/2019 FINDINGS: 0504 hours. Low volumes. The cardio pericardial silhouette is borderline enlarged. Patchy airspace disease noted right upper lobe and both lung bases. There is pulmonary vascular congestion without overt pulmonary edema. No pleural effusion. The visualized bony structures of the thorax show no acute abnormality. IMPRESSION: 1. Patchy airspace opacity right upper lobe and both lung bases, suspicious for multifocal pneumonia. 2. Borderline cardiomegaly with vascular congestion. Electronically Signed   By: Misty Stanley M.D.   On: 11/10/2019 05:35    Procedures .Critical Care Performed by: Amaryllis Dyke, PA-C Authorized by: Amaryllis Dyke, PA-C     CRITICAL CARE Performed by: Kennith Maes   Total critical care time: 30 minutes  Critical care time was exclusive of separately billable procedures and treating other patients.  Critical care was necessary to treat or prevent imminent or life-threatening deterioration.  Critical care was time spent personally by me on the following activities: development of treatment plan with patient and/or surrogate as well as nursing, discussions with consultants, evaluation of patient's response to treatment,  examination of patient, obtaining history from patient or surrogate, ordering and performing treatments and interventions, ordering and review of laboratory studies, ordering and review of radiographic studies, pulse oximetry and re-evaluation of patient's condition. (including critical care time)  Medications Ordered in ED Medications  remdesivir 200 mg in sodium chloride 0.9% 250 mL IVPB (has no administration in time range)    Followed by  remdesivir 100 mg in sodium chloride 0.9 % 100 mL IVPB (has no administration in time range)    ED Course  I have reviewed the triage vital signs and the nursing notes.  Pertinent labs & imaging results that were available during my care of the patient were reviewed by me and considered in my medical decision making (see chart for details).    Lashawn Nichter was evaluated in Emergency Department on 11/10/2019 for the symptoms described in the history of present illness. He/she was evaluated in the context of the global COVID-19 pandemic, which necessitated consideration that the patient might be at risk for infection with the SARS-CoV-2 virus that causes COVID-19. Institutional protocols and algorithms that pertain to the evaluation of patients at risk for COVID-19 are in a state of rapid change based on information released by regulatory bodies including the CDC and federal and state organizations. These policies and algorithms were followed during the patient's care in the ED.  MDM Rules/Calculators/A&P                          Patient presents to the ED with complaints of cough. She is tachycardic, hypertensive, & hypoxic w/ ambulation.  Additional history obtained:  Additional history obtained from chart review & nursing note review. Previous records obtained and reviewed.   COVID 19 test per triage is positive--> Will start remdesivir & steroids (pending negative pregnancy test). Tylenol ordered for borderline temp w/ her tachycardia. Anticipate  admission.   EKG: No STEMI, prolonged QTc- add on mag.  Lab  Tests:  I Ordered, reviewed, and interpreted labs, which included:  CBC: Anemia similar to prior.  CMP: Mildly elevated LFTs. Hyperglycemia w/o acidosis or anion gape elevation. Renal function preserved.  Lactic acid: WNL D-dimer: WNL Fibrinogen, LDH, & triglycerides: Elevated Troponin: WNL  Imaging Studies ordered:  I ordered imaging studies which included CXR, I independently visualized and interpreted imaging which showed 1. Patchy airspace opacity right upper lobe and both lung bases, suspicious for multifocal pneumonia. 2. Borderline cardiomegaly with vascular congestion.   Patient care signed out to Benedetto Goad PA-C at change of shift pending preg test & consult for admission.   Portions of this note were generated with Lobbyist. Dictation errors may occur despite best attempts at proofreading.  Final Clinical Impression(s) / ED Diagnoses Final diagnoses:  [id number]  Acute hypoxemic respiratory failure [facility])    Rx / DC Orders ED Discharge Orders    None       Amaryllis Dyke, PA-C 11/10/19 7614    Merryl Hacker, MD 11/10/19 2318

## 2019-11-11 DIAGNOSIS — U071 COVID-19: Secondary | ICD-10-CM | POA: Diagnosis not present

## 2019-11-11 LAB — GLUCOSE, CAPILLARY
Glucose-Capillary: 366 mg/dL — ABNORMAL HIGH (ref 70–99)
Glucose-Capillary: 383 mg/dL — ABNORMAL HIGH (ref 70–99)
Glucose-Capillary: 379 mg/dL — ABNORMAL HIGH (ref 70–99)
Glucose-Capillary: 314 mg/dL — ABNORMAL HIGH (ref 70–99)
Glucose-Capillary: 322 mg/dL — ABNORMAL HIGH (ref 70–99)
Glucose-Capillary: 274 mg/dL — ABNORMAL HIGH (ref 70–99)

## 2019-11-11 LAB — COMPREHENSIVE METABOLIC PANEL
ALT: 58 U/L — ABNORMAL HIGH (ref 0–44)
AST: 43 U/L — ABNORMAL HIGH (ref 15–41)
Albumin: 3.1 g/dL — ABNORMAL LOW (ref 3.5–5.0)
Alkaline Phosphatase: 95 U/L (ref 38–126)
Anion gap: 11 (ref 5–15)
BUN: 18 mg/dL (ref 6–20)
CO2: 24 mmol/L (ref 22–32)
Calcium: 8.9 mg/dL (ref 8.9–10.3)
Chloride: 100 mmol/L (ref 98–111)
Creatinine, Ser: 0.81 mg/dL (ref 0.44–1.00)
GFR, Estimated: >60 mL/min (ref >60)
Glucose, Bld: 381 mg/dL — ABNORMAL HIGH (ref 70–99)
Potassium: 4.4 mmol/L (ref 3.5–5.1)
Sodium: 135 mmol/L (ref 135–145)
Total Bilirubin: 0.1 mg/dL — ABNORMAL LOW (ref 0.3–1.2)
Total Protein: 7.3 g/dL (ref 6.5–8.1)

## 2019-11-11 LAB — CBC WITH DIFFERENTIAL/PLATELET
Abs Immature Granulocytes: 0.03 10*3/uL (ref 0.00–0.07)
Basophils Absolute: 0 10*3/uL (ref 0.0–0.1)
Basophils Relative: 0 %
Eosinophils Absolute: 0 10*3/uL (ref 0.0–0.5)
Eosinophils Relative: 0 %
HCT: 37.2 % (ref 36.0–46.0)
Hemoglobin: 11.1 g/dL — ABNORMAL LOW (ref 12.0–15.0)
Immature Granulocytes: 0 %
Lymphocytes Relative: 14 %
Lymphs Abs: 1.1 10*3/uL (ref 0.7–4.0)
MCH: 21.9 pg — ABNORMAL LOW (ref 26.0–34.0)
MCHC: 29.8 g/dL — ABNORMAL LOW (ref 30.0–36.0)
MCV: 73.2 fL — ABNORMAL LOW (ref 80.0–100.0)
Monocytes Absolute: 0.2 10*3/uL (ref 0.1–1.0)
Monocytes Relative: 3 %
Neutro Abs: 6.7 10*3/uL (ref 1.7–7.7)
Neutrophils Relative %: 83 %
Platelets: 334 10*3/uL (ref 150–400)
RBC: 5.08 MIL/uL (ref 3.87–5.11)
RDW: 15.7 % — ABNORMAL HIGH (ref 11.5–15.5)
WBC: 8.1 10*3/uL (ref 4.0–10.5)
nRBC: 0 % (ref 0.0–0.2)

## 2019-11-11 LAB — C-REACTIVE PROTEIN: CRP: 4.2 mg/dL — ABNORMAL HIGH (ref <1.0)

## 2019-11-11 LAB — D-DIMER, QUANTITATIVE: D-Dimer, Quant: <0.27 ug/mL-FEU (ref 0.00–0.50)

## 2019-11-11 LAB — PHOSPHORUS: Phosphorus: 4.3 mg/dL (ref 2.5–4.6)

## 2019-11-11 LAB — MAGNESIUM: Magnesium: 2.1 mg/dL (ref 1.7–2.4)

## 2019-11-11 LAB — HIV ANTIBODY (ROUTINE TESTING W REFLEX): HIV Screen 4th Generation wRfx: NONREACTIVE

## 2019-11-11 LAB — FERRITIN: Ferritin: 185 ng/mL (ref 11–307)

## 2019-11-11 MED ORDER — INSULIN STARTER KIT- PEN NEEDLES (ENGLISH)
1.0000 | Freq: Once | Status: AC
  Administered 2019-11-11: 1
  Filled 2019-11-11 (×2): qty 1

## 2019-11-11 MED ORDER — INSULIN ASPART 100 UNIT/ML ~~LOC~~ SOLN
0.0000 [IU] | Freq: Every day | SUBCUTANEOUS | Status: DC
  Administered 2019-11-11: 3 [IU] via SUBCUTANEOUS

## 2019-11-11 MED ORDER — LIVING WELL WITH DIABETES BOOK
Freq: Once | Status: AC
  Filled 2019-11-11: qty 1

## 2019-11-11 MED ORDER — INSULIN ASPART 100 UNIT/ML ~~LOC~~ SOLN
0.0000 [IU] | Freq: Three times a day (TID) | SUBCUTANEOUS | Status: DC
  Administered 2019-11-11: 15 [IU] via SUBCUTANEOUS
  Administered 2019-11-11: 20 [IU] via SUBCUTANEOUS
  Administered 2019-11-12: 15 [IU] via SUBCUTANEOUS
  Administered 2019-11-12: 11 [IU] via SUBCUTANEOUS

## 2019-11-11 MED ORDER — INSULIN GLARGINE 100 UNIT/ML ~~LOC~~ SOLN
15.0000 [IU] | Freq: Every day | SUBCUTANEOUS | Status: DC
  Filled 2019-11-11: qty 0.15

## 2019-11-11 MED ORDER — INSULIN DETEMIR 100 UNIT/ML ~~LOC~~ SOLN
20.0000 [IU] | Freq: Two times a day (BID) | SUBCUTANEOUS | Status: DC
  Administered 2019-11-11 – 2019-11-12 (×3): 20 [IU] via SUBCUTANEOUS
  Filled 2019-11-11 (×4): qty 0.2

## 2019-11-11 MED ORDER — INSULIN ASPART 100 UNIT/ML ~~LOC~~ SOLN
3.0000 [IU] | Freq: Three times a day (TID) | SUBCUTANEOUS | Status: DC
  Administered 2019-11-11: 3 [IU] via SUBCUTANEOUS

## 2019-11-11 MED ORDER — INSULIN GLARGINE 100 UNIT/ML ~~LOC~~ SOLN: 15.0000 [IU] | Freq: Every day | SUBCUTANEOUS | Status: DC

## 2019-11-11 NOTE — Progress Notes (Addendum)
Inpatient Diabetes Program Recommendations  AACE/ADA: New Consensus Statement on Inpatient Glycemic Control (2015)  Target Ranges:  Prepandial:   less than 140 mg/dL      Peak postprandial:   less than 180 mg/dL (1-2 hours)      Critically ill patients:  140 - 180 mg/dL   Lab Results  Component Value Date   GLUCAP 366 (H) 11/11/2019   HGBA1C 9.6 (H) 11/10/2019    Review of Glycemic Control Results for KEYANAH, KOZICKI (MRN [id number]) as of 11/11/2019 09:26  Ref. Range 11/10/2019 11:49 11/10/2019 16:08 11/10/2019 20:59 11/11/2019 07:50  Glucose-Capillary Latest Ref Range: 70 - 99 mg/dL 385 (H) 452 (H) 383 (H) 366 (H)    Inpatient Diabetes Program Recommendations:     Please consider the Glycemic Control- Covid order set.  Levemir 20 units BID   Addendum @ 5631- Spoke with patient over the phone.  Spoke with pt about new diagnosis. Discussed A1C results with her and explained what an A1C is, basic pathophysiology of DM Type 2, basic home care, basic diabetes diet nutrition principles, importance of checking CBGs and maintaining good CBG control to prevent long-term and short-term complications. Reviewed signs and symptoms of hyperglycemia and hypoglycemia and how to treat hypoglycemia at home. Also reviewed blood sugar goals at home.  RNs to provide ongoing basic DM education at bedside with this patient. Have ordered educational booklet, insulin starter kit, and DM videos. Have also placed RD consult for DM diet education for this patient.    She states she had GDM 2 years ago and her mom has type 2 diabetes.  Her mom is on Metformin and insulin.    Patients average blood sugar prior to steroids is 229 mg/dL based on A1c of 9.6%.  She drinks 3 sodas a day and enjoys mac and cheese, rice and potatoes.  Educated her on CHO's and limiting them with meals.  Encouraged her to drink non-caloric beverages and water.  Discussed importance of exercise.  She will need a glucometer at  discharge.  She is aware how to use.  Asked her to check her blood sugar before breakfast, mid-afternoon and bedtime.    Patient may need insulin at discharge as blood sugars are very elevated with steroids.  Reached out to [facility] for assistance with establishing a PCP.    Will speak with patient again tomorrow for reinforcement of education and possible insulin teaching for home.    Asked RN to allow patient to check her own blood sugar and administer her insulin.    Discussed Metformin, how it works and side effects.  If prescribed at discharged asked her to take with meals.  MD please order glucometer at discharge Order # [id number]  Will continue to follow while inpatient.  Thank you, Reche Dixon, RN, BSN Diabetes Coordinator Inpatient Diabetes Program [phone] (team pager from 8a-5p)

## 2019-11-11 NOTE — Progress Notes (Signed)
Subjective:   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  Overnight, no acute events.  This morning, patient reports that her shortness of breath and cough have significantly improved. She denies any prior history of diabetes and reports that she has not taken insulin or oral antiglycemic medications. She is interested in going home as soon as possible.   Objective:  Vital signs in last 24 hours: Vitals:   11/10/19 2015 11/11/19 0451 11/11/19 0757 11/11/19 1100  BP: (!) 141/97 (!) 133/98    Pulse: (!) 107 85    Resp: 18 20    Temp: 98.1 F (36.7 C) 97.6 F (36.4 C)    TempSrc: Oral Oral    SpO2: 97% 96% 94% 95%  Weight:      Height:      SpO2: 95 % O2 Flow Rate (L/min): 1 L/min Filed Weights   11/09/19 1420  Weight: 131.5 kg    Intake/Output Summary (Last 24 hours) at 11/11/2019 1313 Last data filed at 11/11/2019 0829 Gross per 24 hour  Intake 740 ml  Output --  Net 740 ml   Physical Exam Vitals reviewed.  Constitutional:      General: She is not in acute distress.    Appearance: Normal appearance. She is obese.  HENT:     Head: Normocephalic and atraumatic.  Eyes:     Extraocular Movements: Extraocular movements intact.     Conjunctiva/sclera: Conjunctivae normal.  Cardiovascular:     Rate and Rhythm: Normal rate and regular rhythm.     Pulses: Normal pulses.     Heart sounds: Normal heart sounds.  Pulmonary:     Effort: Pulmonary effort is normal. No respiratory distress.     Breath sounds: Normal breath sounds.  Abdominal:     General: Abdomen is flat. Bowel sounds are normal.     Palpations: Abdomen is soft.     Tenderness: There is no abdominal tenderness.  Musculoskeletal:        General: Normal range of motion.     Cervical back:  Normal range of motion and neck supple.     Right lower leg: No edema.     Left lower leg: No edema.  Skin:    General: Skin is warm and dry.     Capillary Refill: Capillary refill takes less than 2 seconds.  Neurological:     General: No focal deficit present.     Mental Status: She is alert. Mental status is at baseline.  Psychiatric:        Mood and Affect: Mood normal.        Behavior: Behavior normal.        Thought Content: Thought content normal.        Judgment: Judgment normal.     CBC Latest Ref Rng & Units 11/11/2019 11/10/2019 10/22/2019  WBC 4.0 - 10.5 K/uL 8.1 8.9 13.3(H)  Hemoglobin 12.0 - 15.0 g/dL 11.1(L) 11.6(L) 10.9(L)  Hematocrit 36 - 46 % 37.2 38.4 36.4  Platelets 150 - 400 K/uL 334 382 368   CMP Latest Ref Rng & Units 11/11/2019 11/10/2019 10/22/2019  Glucose 70 - 99 mg/dL 381(H) 247(H) 225(H)  BUN 6 - 20 mg/dL 18 8 9   Creatinine 0.44 - 1.00 mg/dL 0.81 0.89 0.71  Sodium 135 -  145 mmol/L 135 136 137  Potassium 3.5 - 5.1 mmol/L 4.4 3.7 3.9  Chloride 98 - 111 mmol/L 100 100 103  CO2 22 - 32 mmol/L 24 23 26   Calcium 8.9 - 10.3 mg/dL 8.9 8.7(L) 9.5  Total Protein 6.5 - 8.1 g/dL 7.3 7.7 -  Total Bilirubin 0.3 - 1.2 mg/dL 0.1(L) 0.3 -  Alkaline Phos 38 - 126 U/L 95 100 -  AST 15 - 41 U/L 43(H) 51(H) -  ALT 0 - 44 U/L 58(H) 60(H) -   Hemoglobin A1c: 9.6 D-Dimer: <0.27 Mg: 2.1 Phos: 4.3 Ferritin: 185 CRP: 4.2  DG Chest Port 1 View  Result Date: 11/10/2019 CLINICAL DATA:  Body aches and cough. EXAM: PORTABLE CHEST 1 VIEW COMPARISON:  10/23/2019 FINDINGS: 0504 hours. Low volumes. The cardio pericardial silhouette is borderline enlarged. Patchy airspace disease noted right upper lobe and both lung bases. There is pulmonary vascular congestion without overt pulmonary edema. No pleural effusion. The visualized bony structures of the thorax show no acute abnormality. IMPRESSION: 1. Patchy airspace opacity right upper lobe and both lung bases, suspicious for  multifocal pneumonia. 2. Borderline cardiomegaly with vascular congestion. Electronically Signed   By: Misty Stanley M.D.   On: 11/10/2019 05:35   Assessment/Plan:  Active Problems:   COVID-19 virus infection  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  #COVID-19, active Patient presented with complaints of cough and shortness of breath found to have fever and positive for COVID-19 infection. Inflammatory markers mildly elevated. This morning, patient saturating at nearly 100% on room air while at rest. She states that her breathing and cough is much improved. She is interested in going home. -Continue remdesivir while admitted (day 2/5) -Continue dexamethasone while admitted and discharge on oral regimen (day 2/10) -Trend inflammatory markers while admitted -Incentive spirometry -O2 via [place], as needed  #T2DM Hemoglobin A1c of 9.6. Patient reports no knowledge of prior diagnosis of diabetes and denies an antiglycemic regimen. She had significant hyperglycemia yesterday to 452. She is currently prescribed dexamethasone for her COVID-19 infection. SSI transitioned to resistant yesterday afternoon due to persistent hyperglycemia and 10u of lantus at bedtime ordered. Diabetes coordinator consulted for recommendations, who recommended switching insulin regimen to levemir 20u twice daily. -Levemir 20 units twice daily -Continue Resistant sliding scale (0-20 units) -Continue nightly correction (0-5 units) -CBG monitoring  #Recurrent PE On Xarelto at home and s/p IVC filter. She was supposed to be on life long AC but did not have a prescription for about a month before until she was seen in ED 9/24 when CT angio showed chornic PE and Xarelto resumed. She reports compliance to Xarelto  since then but missed yesterday dose coming to ED. She reports intermittent missed dose of Xarelto at home. Although patient presented with left leg pain, D-Dimer was negative lessening the suspicion for DVT. She reports improvement of her leg pain on evaluation today. -Continue home Xarelto 15 mg BID for 3 days then 20 mg QD (starting on 11/13/19) -Cardiac monitoring  #Prolonged QTC Corrected QT: 518 Avoiding QTC prolonging meds and replacing electrolytes as needed. Cardiac monitoring  VTE ppx: Rivaroxaban 15mg  twice daily (transition to 20mg  daily on 10/15) Bowel regimen: Miralax daily PRN IVF: none Code status: Full Diet: Heart healthy  Cato Mulligan, MD 11/11/2019, 1:13  PM Pager: [phone] After 5pm on weekdays and 1pm on weekends: On Call pager [phone]

## 2019-11-11 NOTE — Progress Notes (Signed)
Date: 11/11/2019  Patient name: Virginia Fitzgerald  Medical record number: [id number]  Date of birth: 04-11-1985   I have seen and evaluated Virginia Fitzgerald and discussed their care with the Residency Team. 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  Patient states that over the last 5 days she has noted cough which is progressively worsened associated with intermittent fevers and chills and myalgias. Patient states cough is productive with whitish phlegm. Patient also notes associated shortness of breath over the last day. No chest pain, no palpitations, no diaphoresis, no lightheadedness, no syncope, no focal weakness, no tingling or numbness, no headache, no blurry vision, no nausea or vomiting, no abdominal pain, no diarrhea. Patient does have a history of recurrent DVT and PE for which she takes Xarelto at home. Patient was noted to be COVID-19 positive in the ED and her O2 sats dropped to the 80s with ambulation requiring O2 supplementation.  Today, patient states that she feels well and that her shortness of breath has improved and that her cough is improving as well.  PMHx, Fam Hx, and/or Soc Hx : As per resident admit note  Vitals:   11/11/19 1100 11/11/19 1421  BP:  (!) 136/101  Pulse:  (!) 102  Resp:  20  Temp:  98.4 F (36.9 C)  SpO2: 95%    General: Awake, alert, oriented x3, NAD CVS: Regular rate and rhythm, normal heart sounds Lungs: CTA bilaterally, no wheezes or rales noted Abdomen: Soft, nontender, nondistended, normoactive bowel sounds Extremities: No edema noted, nontender to palpation Psych: Normal mood and affect HEENT: Normocephalic, atraumatic Skin: Warm and dry  Assessment and Plan: I have seen and evaluated the patient as outlined above. I agree with the formulated Assessment and  Plan as detailed in the residents' note, with the following changes:   1. Acute hypoxic respiratory failure secondary COVID-19 pneumonia: -Patient presented to the ED with progressive worsening of cough associated with shortness of breath as well as chest x-ray showing multifocal pneumonia and was found to be COVID-19 positive. -Patient initially was noted to be hypoxic with ambulation with O2 sats down to the 80s. Today, patient's O2 sats have remained in the 90s on room air -We will obtain O2 sats at rest and ambulation on room air to see if he would require supplemental O2 on discharge -We will continue with Decadron and remdesivir day 2 for now -Patient with mildly elevated inflammatory markers on admission. We will continue to monitor these closely -No indication for starting baricitinib at this time -I suspect the patient should be stable for DC home in a.m.  2. Uncontrolled type 2 diabetes: -Patient is unaware of her diagnosis of diabetes. She has not been taking any medication for this and has not been following with the PCP. -Patient was noted to have elevated blood sugars to 300s on this admission with an A1c of 9.6. I suspect that her worsening blood sugars are likely secondary to her need for Decadron for her COVID-19 infection -We will continue with Levemir 20 units twice daily as well as sliding scale insulin -We will obtain RD consult to help discuss a diabetic diet but the patient -Diabetes coordinator is following with the patient and will educate the patient on insulin use. -I suspect given her elevated blood sugars on this admission she will likely need  insulin on discharge as well as close follow-up with the PCP. Would also consider starting her on Metformin on discharge.  Aldine Contes, MD 10/13/20213:05 PM

## 2019-11-11 NOTE — Progress Notes (Signed)
SATURATION QUALIFICATIONS: (This note is used to comply with regulatory documentation for home oxygen)  Patient Saturations on Room Air at Rest = 95%  Patient Saturations on Room Air while Ambulating = 92%  Patient Saturations on 0 Liters of oxygen while Ambulating = 92 %  Please briefly explain why patient needs home oxygen:  Pt ambulated the halls without any supplemental oxygen.

## 2019-11-12 ENCOUNTER — Other Ambulatory Visit (HOSPITAL_COMMUNITY): Payer: Self-pay | Admitting: Student

## 2019-11-12 DIAGNOSIS — U071 COVID-19: Secondary | ICD-10-CM | POA: Diagnosis not present

## 2019-11-12 LAB — CBC WITH DIFFERENTIAL/PLATELET
Abs Immature Granulocytes: 0.09 10*3/uL — ABNORMAL HIGH (ref 0.00–0.07)
Basophils Absolute: 0 10*3/uL (ref 0.0–0.1)
Basophils Relative: 0 %
Eosinophils Absolute: 0 10*3/uL (ref 0.0–0.5)
Eosinophils Relative: 0 %
HCT: 40.6 % (ref 36.0–46.0)
Hemoglobin: 12.3 g/dL (ref 12.0–15.0)
Immature Granulocytes: 1 %
Lymphocytes Relative: 19 %
Lymphs Abs: 2.8 10*3/uL (ref 0.7–4.0)
MCH: 22.1 pg — ABNORMAL LOW (ref 26.0–34.0)
MCHC: 30.3 g/dL (ref 30.0–36.0)
MCV: 72.9 fL — ABNORMAL LOW (ref 80.0–100.0)
Monocytes Absolute: 0.5 10*3/uL (ref 0.1–1.0)
Monocytes Relative: 3 %
Neutro Abs: 11.8 10*3/uL — ABNORMAL HIGH (ref 1.7–7.7)
Neutrophils Relative %: 77 %
Platelets: 315 10*3/uL (ref 150–400)
RBC: 5.57 MIL/uL — ABNORMAL HIGH (ref 3.87–5.11)
RDW: 15.7 % — ABNORMAL HIGH (ref 11.5–15.5)
WBC: 15.2 10*3/uL — ABNORMAL HIGH (ref 4.0–10.5)
nRBC: 0 % (ref 0.0–0.2)

## 2019-11-12 LAB — COMPREHENSIVE METABOLIC PANEL
ALT: 52 U/L — ABNORMAL HIGH (ref 0–44)
AST: 37 U/L (ref 15–41)
Albumin: 3.3 g/dL — ABNORMAL LOW (ref 3.5–5.0)
Alkaline Phosphatase: 92 U/L (ref 38–126)
Anion gap: 12 (ref 5–15)
BUN: 19 mg/dL (ref 6–20)
CO2: 23 mmol/L (ref 22–32)
Calcium: 9.3 mg/dL (ref 8.9–10.3)
Chloride: 102 mmol/L (ref 98–111)
Creatinine, Ser: 0.9 mg/dL (ref 0.44–1.00)
GFR, Estimated: >60 mL/min (ref >60)
Glucose, Bld: 171 mg/dL — ABNORMAL HIGH (ref 70–99)
Potassium: 3.9 mmol/L (ref 3.5–5.1)
Sodium: 137 mmol/L (ref 135–145)
Total Bilirubin: 0.2 mg/dL — ABNORMAL LOW (ref 0.3–1.2)
Total Protein: 7.4 g/dL (ref 6.5–8.1)

## 2019-11-12 LAB — GLUCOSE, CAPILLARY
Glucose-Capillary: 252 mg/dL — ABNORMAL HIGH (ref 70–99)
Glucose-Capillary: 329 mg/dL — ABNORMAL HIGH (ref 70–99)

## 2019-11-12 LAB — MAGNESIUM: Magnesium: 2 mg/dL (ref 1.7–2.4)

## 2019-11-12 LAB — D-DIMER, QUANTITATIVE: D-Dimer, Quant: 0.4 ug/mL-FEU (ref 0.00–0.50)

## 2019-11-12 LAB — FERRITIN: Ferritin: 192 ng/mL (ref 11–307)

## 2019-11-12 LAB — C-REACTIVE PROTEIN: CRP: 2.1 mg/dL — ABNORMAL HIGH (ref <1.0)

## 2019-11-12 LAB — PHOSPHORUS: Phosphorus: 3.2 mg/dL (ref 2.5–4.6)

## 2019-11-12 MED ORDER — BLOOD GLUCOSE METER KIT: PACK | 0 refills | Status: DC

## 2019-11-12 MED ORDER — GUAIFENESIN-DM 100-10 MG/5ML PO SYRP: 10.0000 mL | ORAL_SOLUTION | ORAL | 0 refills | Status: DC | PRN

## 2019-11-12 MED ORDER — INSULIN DETEMIR 100 UNIT/ML ~~LOC~~ SOLN
20.0000 [IU] | Freq: Two times a day (BID) | SUBCUTANEOUS | 0 refills | Status: DC
Start: 2019-11-12 — End: 2019-12-10

## 2019-11-12 MED ORDER — RIVAROXABAN 20 MG PO TABS
20.0000 mg | ORAL_TABLET | Freq: Every day | ORAL | 0 refills | Status: DC
Start: 2019-11-13 — End: 2019-12-10

## 2019-11-12 MED ORDER — DEXAMETHASONE 6 MG PO TABS: 6.0000 mg | ORAL_TABLET | Freq: Every day | ORAL | 0 refills | Status: DC

## 2019-11-12 MED ORDER — GUAIFENESIN-DM 100-10 MG/5ML PO SYRP
10.0000 mL | ORAL_SOLUTION | ORAL | Status: DC | PRN
  Administered 2019-11-12: 10 mL via ORAL
  Filled 2019-11-12: qty 10

## 2019-11-12 MED FILL — ACCU-CHEK GUIDE W/DEVICE KI: W/DEVICE | 1 days supply | Qty: 1 | Fill #0

## 2019-11-12 MED FILL — ROBAFEN DM CGH-CHEST CONG S: 10-100 | 2 days supply | Qty: 118 | Fill #0

## 2019-11-12 MED FILL — LEVEMIR 100 UNITS/ML VIAL: 100 | 25 days supply | Qty: 10 | Fill #0

## 2019-11-12 MED FILL — ACCU-CHEK GUIDE TEST STRIP: 25 days supply | Qty: 100 | Fill #0

## 2019-11-12 MED FILL — XARELTO 20 MG TABLET: 20 | 30 days supply | Qty: 30 | Fill #0

## 2019-11-12 MED FILL — ULTICARE INS 0.3 ML 30GX1/2: 30G X 1/2" | 30 days supply | Qty: 60 | Fill #0

## 2019-11-12 MED FILL — DEXAMETHASONE 6 MG TABLET: 6 | 7 days supply | Qty: 7 | Fill #0

## 2019-11-12 MED FILL — ACCU-CHEK SOFTCLIX LANCETS: 25 days supply | Qty: 100 | Fill #0

## 2019-11-12 NOTE — Discharge Instructions (Signed)
Virginia Fitzgerald,   It was a pleasure meeting you during your recent hospitalization. You were hospitalized for COVID-19 infection and found to have poorly controlled diabetes. We would like to discharge you on dexamethasone for an additional seven days as well as an insulin regimen of Levemir 20 units twice daily. We would like you to follow up with the [facility] following your discharge for hospital follow-up and continued management of your diabetes. Additionally, I have re-prescribed Xarelto 20mg  which you should take once daily with dinner, as well as Robitussin which you can take every four hours as needed for cough.  Sincerely, Paulla Dolly, MD   10 Things You Can Do to Manage Your COVID-19 Symptoms at Home If you have possible or confirmed COVID-19: 1. Stay home from work and school. And stay away from other public places. If you must go out, avoid using any kind of public transportation, ridesharing, or taxis. 2. Monitor your symptoms carefully. If your symptoms get worse, call your healthcare provider immediately. 3. Get rest and stay hydrated. 4. If you have a medical appointment, call the healthcare provider ahead of time and tell them that you have or may have COVID-19. 5. For medical emergencies, call 911 and notify the dispatch personnel that you have or may have COVID-19. 6. Cover your cough and sneezes with a tissue or use the inside of your elbow. 7. Wash your hands often with soap and water for at least 20 seconds or clean your hands with an alcohol-based hand sanitizer that contains at least 60% alcohol. 8. As much as possible, stay in a specific room and away from other people in your home. Also, you should use a separate bathroom, if available. If you need to be around other people in or outside of the home, wear a mask. 9. Avoid sharing personal items with other people in your household, like dishes, towels, and bedding. 10. Clean all  surfaces that are touched often, like counters, tabletops, and doorknobs. Use household cleaning sprays or wipes according to the label instructions. [email or web address] 07/30/2018 This information is not intended to replace advice given to you by your health care provider. Make sure you discuss any questions you have with your health care provider. Document Revised: 01/01/2019 Document Reviewed: 01/01/2019 Elsevier Patient Education  [place].

## 2019-11-12 NOTE — Progress Notes (Signed)
Pt educated on doing own finger sticks, self insulin administration, diabetic booklet, and diabetic kit.  Pt demonstrated understanding by doing own finger sticks, insulin administration and verbalizing understanding.

## 2019-11-12 NOTE — TOC Initial Note (Signed)
Transition of Care [facility]) - Initial/Assessment Note    Patient Details  Name: Virginia Fitzgerald MRN: [id number] Date of Birth: Oct 21, 1985  [facility] CM/SW Contact:    Verdell Carmine, RN Phone Number: 11/12/2019, 9:26 AM  Clinical Narrative:                 Patient admitted with COVID pneumonia, oxygen qualifications reveal that patient no longer requires oxygen. COVID appointment made, needs to follow up with [facility] and wellness for DM2 newly diagnosed. Attempted to call room to discuss without answer, will try patient again later. CM will follow for needs..   Expected Discharge Plan: Home/Self Care Barriers to Discharge: Continued Medical Work up   Patient Goals and CMS Choice        Expected Discharge Plan and Services Expected Discharge Plan: Home/Self Care   Discharge Planning Services: CM Consult, Follow-up appt scheduled   Living arrangements for the past 2 months: Apartment                                      Prior Living Arrangements/Services Living arrangements for the past 2 months: Apartment   Patient language and need for interpreter reviewed:: Yes        Need for Family Participation in Patient Care: Yes (Comment) Care giver support system in place?: Yes (comment)   Criminal Activity/Legal Involvement Pertinent to Current Situation/Hospitalization: No - Comment as needed  Activities of Daily Living Home Assistive Devices/Equipment: None ADL Screening (condition at time of admission) Patient's cognitive ability adequate to safely complete daily activities?: Yes Is the patient deaf or have difficulty hearing?: No Does the patient have difficulty seeing, even when wearing glasses/contacts?: No Does the patient have difficulty concentrating, remembering, or making decisions?: No Patient able to express need for assistance with ADLs?: Yes Does the patient have difficulty dressing or bathing?: No Independently performs  ADLs?: Yes (appropriate for developmental age) Does the patient have difficulty walking or climbing stairs?: No Weakness of Legs: None Weakness of Arms/Hands: None  Permission Sought/Granted                  Emotional Assessment       Orientation: : Oriented to Self, Oriented to Place, Oriented to  Time, Oriented to Situation Alcohol / Substance Use: Not Applicable Psych Involvement: No (comment)  Admission diagnosis:  Acute hypoxemic respiratory failure ([place]) [J96.01] COVID-19 virus infection [U07.1] COVID-19 [U07.1] Patient Active Problem List   Diagnosis Date Noted   COVID-19 virus infection 11/10/2019   Colitis 07/27/2017   Missed abortion 05/29/2017   Dysplasia of cervix, low grade (CIN 1) 03/28/2017   Diabetes type 2, 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 ([place]) 03/28/2017   Essential hypertension, benign 03/20/2017   Recurrent pulmonary embolism ([place]) 03/20/2017   BMI 50.0-59.9, adult ([place]) 02/28/2017   Iron deficiency anemia 11/14/2011   PCP:  Patient, No Pcp Per Pharmacy:   [place], [address] [place] [address] Phone: [phone] Fax: [phone]     Social Determinants of Health (SDOH) Interventions    Readmission Risk Interventions No flowsheet data found.

## 2019-11-12 NOTE — Progress Notes (Signed)
Pt discharged home.  Discharge instructions explained, pt verbalizes understanding.  Pt discharged with prescription medications, glucose test kit and strips.  Pt educated on use, questions answered.  Pt verbalizes understanding.

## 2019-11-12 NOTE — Progress Notes (Signed)
Inpatient Diabetes Program Recommendations  AACE/ADA: New Consensus Statement on Inpatient Glycemic Control (2015)  Target Ranges:  Prepandial:   less than 140 mg/dL      Peak postprandial:   less than 180 mg/dL (1-2 hours)      Critically ill patients:  140 - 180 mg/dL   Lab Results  Component Value Date   GLUCAP 329 (H) 11/12/2019   HGBA1C 9.6 (H) 11/10/2019    Review of Glycemic Control Results for MAYLEA, SORIA (MRN [id number]) as of 11/12/2019 13:44  Ref. Range 11/12/2019 07:31 11/12/2019 12:56  Glucose-Capillary Latest Ref Range: 70 - 99 mg/dL 252 (H) 329 (H)     Inpatient Diabetes Program Recommendations:    Please consider,  Novolog 5 units tid with meals if eats at least 50%  Noted that RN has taught insulin administration and blood sugar monitoring.    Will continue to follow while inpatient.  Thank you, Reche Dixon, RN, BSN Diabetes Coordinator Inpatient Diabetes Program [phone] (team pager from 8a-5p)

## 2019-11-12 NOTE — Discharge Summary (Signed)
Name: Virginia Fitzgerald MRN: [id number] DOB: 20-Sep-1985 34 y.o. PCP: Patient, No Pcp Per  Date of Admission: 11/09/2019  2:02 PM Date of Discharge: 11/12/2019 Attending Physician: Aldine Contes, MD  Discharge Diagnosis: 1. Active Problems:   COVID-19 virus infection  Discharge Medications: Allergies as of 11/12/2019   No Known Allergies     Medication List    STOP taking these medications   Rivaroxaban Stater Pack (15 mg and 20 mg) Commonly known as: XARELTO STARTER PACK Replaced by: rivaroxaban 20 MG Tabs tablet     TAKE these medications   Accu-Chek FastClix Lancets Misc 1 Device by Percutaneous route 4 (four) times daily.   Accu-Chek Guide w/Device Kit 1 Device by Does not apply route 4 (four) times daily.   acetaminophen 500 MG tablet Commonly known as: TYLENOL Take 1,000 mg by mouth every 8 (eight) hours as needed for moderate pain.   blood glucose meter kit and supplies Dispense based on patient and insurance preference. Use up to four times daily as directed. (FOR ICD-10 E10.9, E11.9).   dexamethasone 6 MG tablet Commonly known as: DECADRON Take 1 tablet (6 mg total) by mouth daily. Start taking on: November 13, 2019   glucose blood test strip Commonly known as: Accu-Chek Guide Use as instructed QID   guaiFENesin-dextromethorphan 100-10 MG/5ML syrup Commonly known as: ROBITUSSIN DM Take 10 mLs by mouth every 4 (four) hours as needed for cough.   insulin detemir 100 UNIT/ML injection Commonly known as: LEVEMIR Inject 0.2 mLs (20 Units total) into the skin 2 (two) times daily for 25 days.   rivaroxaban 20 MG Tabs tablet Commonly known as: XARELTO Take 1 tablet (20 mg total) by mouth daily with supper. Start taking on: November 13, 2019 Replaces: Rivaroxaban Stater Pack (15 mg and 20 mg)      Disposition and follow-up:   Ms.Virginia Fitzgerald was discharged from [facility] in Stable condition.  At the hospital follow up visit  please address:  1.   - COVID: Patient's adherence to decadron, recovery from [place], plans to get vaccination. -T2DM: Patient's adherence to blood glucose checks and insulin administration. Ensure patient has appropriate supplies and that her current insulin regimen is appropriate. Provide refills if Levemir regimen is appropriate. -Recurrent DVT/PE: Patient's adherence to 81m xarelto daily. Provide refills.  2.  Labs / imaging needed at time of follow-up: none  3.  Pending labs/ test needing follow-up: none  Follow-up Appointments:  [facility]   Specialty: Family Medicine Why: Appointment made for [facility] 11/25/19 at 09:00 am.  YOU MUST ATTEND THIS APPOINTMENT.  Contact information: [address] Schedule an appointment as soon as possible for a visit in 1 week(s).   Contact information: [address] list: 1. #COVID Patient presented with complaints of cough and shortness of breath found to have fever and positive for COVID-19 infection. Inflammatory markers mildly elevated. Patient initially required 1-2L O2 via nasal cannula to maintain oxygen saturation, however this was discontinued shortly after admission. Patient saturating well on room air the morning after admission. She reported continued improved in shortness of breath and manageable cough with Robitussin. She received one dose of IV solumedrol followed by two  doses of decadron. She received three doses of remdesivir. She was discharged with 7 tablets of decadron to complete her 10-day course and Robitussin. She did not require oxygen upon discharge. We discussed the importance of quarantining following discharge and that she should still get the COVID-19 vaccination. She  understood and agreed with the plan.  #T2DM Patient noted to be hyperglycemic to 300s upon admission. She denied prior knowledge of having a diagnosis of diabetes and therefore denied any prior antiglycemic regimen. Hemoglobin A1c 9.6. Diabetes coordinator consulted and recommended 20 units of Levemir twice daily with resistant sliding scale insulin in the setting of her dexamethasone use. She was instructed on how to check her blood sugars and administer insulin. She received the necessary supplies and medications prior to discharge and reported having no further questions. We discussed the importance of following up closely with the [facility] for management of her condition.  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 Patient presented to ED two weeks ago and reportedly not taking prescribed Xarelto. She was restarted on this medication at the time and titrated up to 19m twice daily. She was continued on this dosing regimen throughout her hospitalization with plan to transition to 276mdaily on 10/15. She was discharged on 10/14 and instructed of this plan. Thirty, 2011mablets of Xarelto were brought to her room prior to discharge. She was informed of the importance of attending [facility] medication refills.  Discharge Vitals:   BP 128/84 (BP Location: Left Arm)   Pulse (!) 104   Temp 98.7 F (37.1 C) (Oral)   Resp 20   Ht '5\' 3"'  (1.6 m)   Wt 131.5 kg   SpO2 93%   BMI 51.37 kg/m   Pertinent Labs, Studies, and Procedures:  CBC Latest Ref Rng & Units 11/12/2019 11/11/2019 11/10/2019  WBC 4.0 - 10.5 K/uL 15.2(H) 8.1 8.9  Hemoglobin 12.0 - 15.0 g/dL 12.3 11.1(L) 11.6(L)  Hematocrit 36 - 46 % 40.6 37.2 38.4  Platelets 150 - 400 K/uL 315 334 382   CMP Latest Ref Rng & Units 11/12/2019 11/11/2019 11/10/2019  Glucose 70 - 99 mg/dL 171(H) 381(H) 247(H)  BUN 6 - 20 mg/dL '19 18 8  ' Creatinine 0.44 - 1.00 mg/dL  0.90 0.81 0.89  Sodium 135 - 145 mmol/L 137 135 136  Potassium 3.5 - 5.1 mmol/L 3.9 4.4 3.7  Chloride 98 - 111 mmol/L 102 100 100  CO2 22 - 32 mmol/L '23 24 23  ' Calcium 8.9 - 10.3 mg/dL 9.3 8.9 8.7(L)  Total Protein 6.5 - 8.1 g/dL 7.4 7.3 7.7  Total Bilirubin 0.3 - 1.2 mg/dL 0.2(L) 0.1(L) 0.3  Alkaline Phos 38 - 126 U/L 92 95 100  AST 15 - 41 U/L 37 43(H) 51(H)  ALT 0 - 44 U/L 52(H) 58(H) 60(H)   Discharge Instructions: Discharge Instructions    Call MD for:  difficulty breathing, headache or visual disturbances   Complete by: As directed    Call MD for:  temperature >100.4   Complete by: As directed    Diet - low sodium heart healthy   Complete by: As directed    Increase activity slowly   Complete by: As directed      Ms. Selmer,   It was a pleasure meeting you during your recent hospitalization. You were hospitalized for COVID-19 infection and found to have poorly controlled diabetes. We would like to discharge you on dexamethasone for an  additional seven days as well as an insulin regimen of Levemir 20 units twice daily. We would like you to follow up with the [facility] following your discharge for hospital follow-up and continued management of your diabetes. Additionally, I have re-prescribed Xarelto 13m which you should take once daily with dinner, as well as Robitussin which you can take every four hours as needed for cough.  Sincerely, MPaulla Dolly MD  Signed: JCato Mulligan MD 11/12/2019, 2:43 PM   Pager: [phone]

## 2019-11-12 NOTE — Progress Notes (Signed)
Subjective:   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  Overnight, no acute events.  This morning, patient reports that she feels well and denies any new complaints. She states that her breathing feels at her baseline. She reports cough, however states that the Robitussin has been helpful for her. She worked with the diabetes coordinator on learning how to check her blood sugars and administer insulin. She is confident that she can continue to do this at home.  Objective:  Vital signs in last 24 hours: Vitals:   11/11/19 2032 11/12/19 0003 11/12/19 0813 11/12/19 1251  BP: (!) 156/99 132/86  128/84  Pulse: (!) 101 (!) 103  (!) 104  Resp: 18 18  20   Temp: 98.2 F (36.8 C) 98 F (36.7 C)  98.7 F (37.1 C)  TempSrc: Oral Oral  Oral  SpO2: 95% 96% 93% 93%  Weight:      Height:      SpO2: 93 % O2 Flow Rate (L/min): 1 L/min Filed Weights   11/09/19 1420  Weight: 131.5 kg    Intake/Output Summary (Last 24 hours) at 11/12/2019 1426 Last data filed at 11/12/2019 0849 Gross per 24 hour  Intake 580 ml  Output --  Net 580 ml   Physical Exam Vitals reviewed.  Constitutional:      General: She is not in acute distress.    Appearance: Normal appearance. She is obese.  HENT:     Head: Normocephalic and atraumatic.  Eyes:     Extraocular Movements: Extraocular movements intact.     Conjunctiva/sclera: Conjunctivae normal.  Cardiovascular:     Rate and Rhythm: Normal rate and regular rhythm.     Pulses: Normal pulses.     Heart sounds: Normal heart sounds.  Pulmonary:     Effort: Pulmonary effort is normal. No respiratory distress.     Breath sounds: Normal breath sounds.  Abdominal:     General: Abdomen is flat. Bowel sounds are normal.     Palpations:  Abdomen is soft.     Tenderness: There is no abdominal tenderness.  Musculoskeletal:        General: Normal range of motion.     Cervical back: Normal range of motion and neck supple.     Right lower leg: No edema.     Left lower leg: No edema.  Skin:    General: Skin is warm and dry.     Capillary Refill: Capillary refill takes less than 2 seconds.  Neurological:     General: No focal deficit present.     Mental Status: She is alert. Mental status is at baseline.  Psychiatric:        Mood and Affect: Mood normal.        Behavior: Behavior normal.        Thought Content: Thought content normal.        Judgment: Judgment normal.     CBC Latest Ref Rng & Units 11/12/2019 11/11/2019 11/10/2019  WBC 4.0 - 10.5 K/uL 15.2(H) 8.1 8.9  Hemoglobin 12.0 - 15.0 g/dL 12.3 11.1(L) 11.6(L)  Hematocrit 36 - 46 % 40.6 37.2 38.4  Platelets 150 - 400 K/uL 315 334 382   CMP Latest Ref Rng & Units 11/12/2019 11/11/2019 11/10/2019  Glucose 70 - 99 mg/dL 171(H)  381(H) 247(H)  BUN 6 - 20 mg/dL 19 18 8   Creatinine 0.44 - 1.00 mg/dL 0.90 0.81 0.89  Sodium 135 - 145 mmol/L 137 135 136  Potassium 3.5 - 5.1 mmol/L 3.9 4.4 3.7  Chloride 98 - 111 mmol/L 102 100 100  CO2 22 - 32 mmol/L 23 24 23   Calcium 8.9 - 10.3 mg/dL 9.3 8.9 8.7(L)  Total Protein 6.5 - 8.1 g/dL 7.4 7.3 7.7  Total Bilirubin 0.3 - 1.2 mg/dL 0.2(L) 0.1(L) 0.3  Alkaline Phos 38 - 126 U/L 92 95 100  AST 15 - 41 U/L 37 43(H) 51(H)  ALT 0 - 44 U/L 52(H) 58(H) 60(H)   Hemoglobin A1c: 9.6 D-Dimer: 0.40 Mg: 2.0 Phos: 3.2 Ferritin: 192 CRP: 2.1 down from 4.2  No results found. Assessment/Plan:  Active Problems:   COVID-19 virus infection  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  #COVID-19, active Patient presented  with complaints of cough and shortness of breath found to have fever and positive for COVID-19 infection. Inflammatory markers mildly elevated. This morning, patient saturating well on room air while at rest. She states that her breathing is much improved today, but continues to endorse mild cough relieved by Robitussin. She is interested in going home today, but understands the need to quarantine following discharge. -Continue remdesivir while admitted (day 3/5) -Continue dexamethasone while admitted and discharge on oral regimen (day 3/10) -Robitussin Q4H PRN -Trend inflammatory markers while admitted -Incentive spirometry -O2 via [place], as needed  #T2DM Hemoglobin A1c of 9.6. Patient reports no knowledge of prior diagnosis of diabetes and denies an antiglycemic regimen. She had significant hyperglycemia yesterday to 452. She is currently prescribed dexamethasone for her COVID-19 infection. SSI transitioned to resistant yesterday afternoon due to persistent hyperglycemia and 10u of lantus at bedtime ordered. Diabetes coordinator consulted for recommendations, who recommended switching insulin regimen to levemir 20u twice daily. Diabetes coordinator and RN instructed patient on how to check her blood sugars and administer insulin. She will require close follow-up in the outpatient setting for her condition. -Levemir 20 units twice daily -Continue Resistant sliding scale (0-20 units) -Continue nightly correction (0-5 units) -CBG monitoring  #Recurrent PE On Xarelto at home and s/p IVC filter. She was supposed to be on life long AC but did not have a prescription for about a month before until she was seen in ED 9/24 when CT angio showed chornic PE and Xarelto resumed. She reports compliance to Xarelto since then but missed yesterday dose coming to ED. She reports intermittent missed dose of Xarelto at home. -Continue home Xarelto 15 mg BID for 3 days then 20 mg QD (starting on 11/13/19) -Cardiac  monitoring  VTE ppx: Rivaroxaban 15mg  twice daily (transition to 20mg  daily on 10/15) Bowel regimen: Miralax daily PRN IVF: none Code status: Full Diet: Heart healthy  Cato Mulligan, MD 11/12/2019, 2:26 PM Pager: [phone] After 5pm on weekdays and 1pm on weekends: On Call pager [phone]

## 2019-11-12 NOTE — TOC Transition Note (Signed)
Transition of Care [facility]) - CM/SW Discharge Note   Patient Details  Name: Virginia Fitzgerald MRN: [id number] Date of Birth: 1985-04-21  [facility] CM/SW Contact:  Verdell Carmine, RN Phone Number: 11/12/2019, 2:55 PM   Clinical Narrative:     Spoke to patient, MATCH done for medications. To [facility] pharmacy. Patient identifies no other needs, not on oxygen. Ambulating well. Ready to discharge.   Final next level of care: Home/Self Care Barriers to Discharge: No Barriers Identified   Patient Goals and CMS Choice        Discharge Placement             Home Self care.          Discharge Plan and Services   Discharge Planning Services: CM Consult, Follow-up appt scheduled                                 Social Determinants of Health (SDOH) Interventions     Readmission Risk Interventions No flowsheet data found.

## 2019-11-15 LAB — CULTURE, BLOOD (ROUTINE X 2)
Culture: NO GROWTH
Culture: NO GROWTH
Special Requests: ADEQUATE

## 2019-11-16 ENCOUNTER — Telehealth: Payer: Self-pay | Admitting: General Practice

## 2019-11-16 NOTE — Telephone Encounter (Signed)
TOC 11/19/2019 at 1:15 pm.   Called no answer, left detailed message to call clinic back.

## 2019-11-16 NOTE — Telephone Encounter (Signed)
-----   Message from Cato Mulligan, MD sent at 11/12/2019 12:10 PM EDT ----- Regarding: Schedule virtual hospital follow-up This is a new patient with no PCP who is discharging from the hospital with COVID-19 and uncontrolled diabetes. We will be discharging her on a new insulin regimen and decadron so she will require close follow-up. Approximately one week from today may be a good time for virtual hospital follow-up.

## 2019-11-19 ENCOUNTER — Telehealth: Payer: Self-pay | Admitting: Internal Medicine

## 2019-11-19 ENCOUNTER — Telehealth: Payer: Medicaid Other | Admitting: Internal Medicine

## 2019-11-19 ENCOUNTER — Other Ambulatory Visit: Payer: Self-pay

## 2019-11-19 NOTE — Telephone Encounter (Signed)
Attempted to call Virginia Fitzgerald for scheduled virtual visit three times via text and telephone. No answer. Voicemail box full. Also attempted to call emergency contact number but found to not be in service.

## 2019-11-25 ENCOUNTER — Other Ambulatory Visit: Payer: Self-pay

## 2019-11-25 ENCOUNTER — Ambulatory Visit
Admission: RE | Admit: 2019-11-25 | Discharge: 2019-11-25 | Disposition: A | Discharge status: Home / Self Care | Payer: Medicaid Other | Source: Ambulatory Visit | Attending: Nurse Practitioner | Admitting: Nurse Practitioner

## 2019-11-25 ENCOUNTER — Ambulatory Visit (INDEPENDENT_AMBULATORY_CARE_PROVIDER_SITE_OTHER): Payer: Medicaid Other | Admitting: Nurse Practitioner

## 2019-11-25 ENCOUNTER — Ambulatory Visit: Admission: RE | Admit: 2019-11-25 | Payer: Medicaid Other | Source: Ambulatory Visit

## 2019-11-25 VITALS — BP 138/92 | HR 110 | Temp 97.1°F | Ht 62.0 in | Wt 281.0 lb

## 2019-11-25 DIAGNOSIS — E119 Type 2 diabetes mellitus without complications: Secondary | ICD-10-CM | POA: Insufficient documentation

## 2019-11-25 DIAGNOSIS — Z8616 Personal history of COVID-19: Secondary | ICD-10-CM | POA: Insufficient documentation

## 2019-11-25 DIAGNOSIS — Z86718 Personal history of other venous thrombosis and embolism: Secondary | ICD-10-CM | POA: Diagnosis not present

## 2019-11-25 DIAGNOSIS — I2699 Other pulmonary embolism without acute cor pulmonale: Secondary | ICD-10-CM

## 2019-11-25 NOTE — Patient Instructions (Signed)
Covid 19:   Stay well hydrated  Stay active  Deep breathing exercises  May start vitamin C daily, vitamin D3 daily, Zinc daily  May take tylenol or fever or pain  May take mucinex DM twice daily  Will order chest x ray  Will order repeat labs   Diabetes:  Will set up appointment to establish care with PCP  Continue diabetic regimen as prescribed at hospital discharge  Recurrent PE:  Continue xarelto     Follow up:  Follow up as needed

## 2019-11-25 NOTE — Telephone Encounter (Signed)
Patient was seen today at [facility] and has appt with Caryl Never at [facility] on 12/10/2019. Hubbard Hartshorn, BSN, RN-BC

## 2019-11-25 NOTE — Assessment & Plan Note (Signed)
Stay well hydrated  Stay active  Deep breathing exercises  May start vitamin C daily, vitamin D3 daily, Zinc daily  May take tylenol or fever or pain  May take mucinex DM twice daily  Will order chest x ray  Will order repeat labs   Diabetes:  Will set up appointment to establish care with PCP  Continue diabetic regimen as prescribed at hospital discharge  Recurrent PE:  Continue xarelto     Follow up:  Follow up as needed

## 2019-11-25 NOTE — Progress Notes (Signed)
_0  ID: Virginia Fitzgerald, female    DOB: Jun 01, 1985, 34 y.o.   MRN: [id number]  Chief Complaint  Patient presents with  . Hospitalization Follow-up    COVID 10/11 Hosp: 10/11-10/14 No Sx, feeling much feeling    Referring provider: No ref. provider found   34 year old female with history of hypertension, recurrent pulmonary embolism, history of DVT distal lower extremity, obesity.  HPI  Patient presents today for post COVID care clinic visit/hospital follow-up.  Patient was admitted to the hospital on 11/09/2019 and discharged on 11/12/2019.  Patient was admitted for COVID and respiratory failure.  Patient was noted to be hyperglycemic on admission.  Hemoglobin A1c was 9.6.  Patient was newly diagnosed as diabetic and started on Levemir.  Patient was discharged home from the hospital with diabetic supplies and insulin.  She has been compliant with checking blood sugars and taking insulin.  Patient does not currently have a PCP and we discussed that we will get her an appointment set up to establish care with a new PCP before the end of the visit today.  Patient does have a history of recurrent DVT/PE and was on Xarelto before hospitalization but admitted that she was not taking it as prescribed.  Patient states that she does have her medication and is taking it as prescribed at this point.  Patient states that she is feeling better.  She has no new concerns at this time.  She does need repeat x-ray and blood work done today. Denies f/c/s, n/v/d, hemoptysis, PND, chest pain or edema.       No Known Allergies  There is no immunization history for the selected administration types on file for this patient.  Past Medical History:  Diagnosis Date  . Anemia   . Bladder infection 03/2017  . Diabetes mellitus without complication ([place])    WITH 2019 PREGNANCY ONLY  . GERD (gastroesophageal reflux disease)    diet controlled - no meds  . Headache    last one 05/25/17 - otc med  .  History of blood transfusion 2012   At [facility]  . History of DVT of lower extremity   . History of hiatal hernia   . MRSA infection    not in EPIC - per patient in 2011  . Pulmonary embolism ([place])   . S/P IVC filter   . SVD (spontaneous vaginal delivery)    x 3  . Thrombocytosis     Tobacco History: Social History   Tobacco Use  Smoking Status Never Smoker  Smokeless Tobacco Never Used  Tobacco Comment   never used tobacco   Counseling given: Not Answered Comment: never used tobacco   Outpatient Encounter Medications as of 11/25/2019  Medication Sig  . ACCU-CHEK FASTCLIX LANCETS MISC 1 Device by Percutaneous route 4 (four) times daily.  Marland Kitchen acetaminophen (TYLENOL) 500 MG tablet Take 1,000 mg by mouth every 8 (eight) hours as needed for moderate pain.  . blood glucose meter kit and supplies Dispense based on patient and insurance preference. Use up to four times daily as directed. (FOR ICD-10 E10.9, E11.9).  Marland Kitchen Blood Glucose Monitoring Suppl (ACCU-CHEK GUIDE) w/Device KIT 1 Device by Does not apply route 4 (four) times daily.  Marland Kitchen glucose blood (ACCU-CHEK GUIDE) test strip Use as instructed QID  . guaiFENesin-dextromethorphan (ROBITUSSIN DM) 100-10 MG/5ML syrup Take 10 mLs by mouth every 4 (four) hours as needed for cough.  . insulin detemir (LEVEMIR) 100 UNIT/ML injection Inject 0.2 mLs (20 Units total) into the  skin 2 (two) times daily for 25 days.  . rivaroxaban (XARELTO) 20 MG TABS tablet Take 1 tablet (20 mg total) by mouth daily with supper.  . [DISCONTINUED] dexamethasone (DECADRON) 6 MG tablet Take 1 tablet (6 mg total) by mouth daily.   No facility-administered encounter medications on file as of 11/25/2019.     Review of Systems  Review of Systems  Constitutional: Negative.  Negative for fatigue and fever.  HENT: Negative.   Respiratory: Negative for cough and shortness of breath.   Cardiovascular: Negative.  Negative for chest pain, palpitations and leg swelling.   Gastrointestinal: Negative.   Allergic/Immunologic: Negative.   Neurological: Negative.   Psychiatric/Behavioral: Negative.        Physical Exam  BP (!) 138/92 (BP Location: Left Arm)   Pulse (!) 110   Temp (!) 97.1 F (36.2 C)   Ht _0  (1.575 m)   Wt 281 lb 0.1 oz (127.5 kg)   LMP 11/02/2019   SpO2 93%   Breastfeeding No   BMI 51.40 kg/m   Wt Readings from Last 5 Encounters:  11/25/19 281 lb 0.1 oz (127.5 kg)  11/09/19 290 lb (131.5 kg)  07/30/18 (!) 314 lb 9.6 oz (142.7 kg)  07/28/17 (!) 305 lb 8.9 oz (138.6 kg)  05/28/17 (!) 315 lb (142.9 kg)     Physical Exam Vitals and nursing note reviewed.  Constitutional:      General: She is not in acute distress.    Appearance: She is well-developed.  Cardiovascular:     Rate and Rhythm: Normal rate and regular rhythm.  Pulmonary:     Effort: Pulmonary effort is normal.     Breath sounds: Normal breath sounds.  Musculoskeletal:     Right lower leg: No edema.     Left lower leg: No edema.  Neurological:     Mental Status: She is alert and oriented to person, place, and time.      Imaging: DG Chest Port 1 View  Result Date: 11/10/2019 CLINICAL DATA:  Body aches and cough. EXAM: PORTABLE CHEST 1 VIEW COMPARISON:  10/23/2019 FINDINGS: 0504 hours. Low volumes. The cardio pericardial silhouette is borderline enlarged. Patchy airspace disease noted right upper lobe and both lung bases. There is pulmonary vascular congestion without overt pulmonary edema. No pleural effusion. The visualized bony structures of the thorax show no acute abnormality. IMPRESSION: 1. Patchy airspace opacity right upper lobe and both lung bases, suspicious for multifocal pneumonia. 2. Borderline cardiomegaly with vascular congestion. Electronically Signed   By: Misty Stanley M.D.   On: 11/10/2019 05:35     Assessment & Plan:   History of COVID-19 Stay well hydrated  Stay active  Deep breathing exercises  May start vitamin C daily,  vitamin D3 daily, Zinc daily  May take tylenol or fever or pain  May take mucinex DM twice daily  Will order chest x ray  Will order repeat labs   Diabetes:  Will set up appointment to establish care with PCP  Continue diabetic regimen as prescribed at hospital discharge  Recurrent PE:  Continue xarelto     Follow up:  Follow up as needed      Fenton Foy, NP 11/25/2019

## 2019-11-27 ENCOUNTER — Other Ambulatory Visit: Payer: Self-pay | Admitting: Nurse Practitioner

## 2019-11-27 LAB — NOVEL CORONAVIRUS, NAA: SARS-CoV-2, NAA: NOT DETECTED

## 2019-11-27 LAB — SARS-COV-2, NAA 2 DAY TAT

## 2019-11-27 MED ORDER — AZITHROMYCIN 250 MG PO TABS: ORAL_TABLET | ORAL | 0 refills | Status: AC

## 2019-11-27 MED ORDER — PREDNISONE 20 MG PO TABS: 20.0000 mg | ORAL_TABLET | Freq: Every day | ORAL | 0 refills | Status: AC

## 2019-11-30 ENCOUNTER — Encounter: Payer: Self-pay | Admitting: Nurse Practitioner

## 2019-12-10 ENCOUNTER — Encounter: Payer: Self-pay | Admitting: Nurse Practitioner

## 2019-12-10 ENCOUNTER — Ambulatory Visit (INDEPENDENT_AMBULATORY_CARE_PROVIDER_SITE_OTHER): Payer: Medicaid Other | Admitting: Nurse Practitioner

## 2019-12-10 ENCOUNTER — Other Ambulatory Visit: Payer: Self-pay

## 2019-12-10 VITALS — BP 120/80 | HR 105 | Temp 97.9°F | Resp 18 | Ht 62.0 in | Wt 289.0 lb

## 2019-12-10 DIAGNOSIS — Z1159 Encounter for screening for other viral diseases: Secondary | ICD-10-CM

## 2019-12-10 DIAGNOSIS — I10 Essential (primary) hypertension: Secondary | ICD-10-CM | POA: Diagnosis not present

## 2019-12-10 DIAGNOSIS — O24410 Gestational diabetes mellitus in pregnancy, diet controlled: Secondary | ICD-10-CM | POA: Diagnosis not present

## 2019-12-10 DIAGNOSIS — Z0000 Encounter for general adult medical examination without abnormal findings: Secondary | ICD-10-CM

## 2019-12-10 DIAGNOSIS — Z23 Encounter for immunization: Secondary | ICD-10-CM | POA: Diagnosis not present

## 2019-12-10 DIAGNOSIS — E119 Type 2 diabetes mellitus without complications: Secondary | ICD-10-CM

## 2019-12-10 DIAGNOSIS — Z7689 Persons encountering health services in other specified circumstances: Secondary | ICD-10-CM

## 2019-12-10 LAB — POCT URINALYSIS DIPSTICK
Bilirubin, UA: NEGATIVE
Glucose, UA: POSITIVE — AB
Leukocytes, UA: NEGATIVE
Nitrite, UA: NEGATIVE
Protein, UA: POSITIVE — AB
Spec Grav, UA: >=1.03 — AB (ref 1.010–1.025)
Urobilinogen, UA: 0.2 E.U./dL
pH, UA: 5.5 (ref 5.0–8.0)

## 2019-12-10 LAB — POCT GLYCOSYLATED HEMOGLOBIN (HGB A1C)
HbA1c POC (<> result, manual entry): 10.8 % (ref 4.0–5.6)
HbA1c, POC (controlled diabetic range): 10.8 % — AB (ref 0.0–7.0)
HbA1c, POC (prediabetic range): 10.8 % — AB (ref 5.7–6.4)
Hemoglobin A1C: 10.8 % — AB (ref 4.0–5.6)

## 2019-12-10 LAB — GLUCOSE, POCT (MANUAL RESULT ENTRY): POC Glucose: 272 mg/dl — AB (ref 70–99)

## 2019-12-10 MED ORDER — INSULIN PEN NEEDLE 29G X 12.7MM MISC: 1.0000 "application " | Freq: Two times a day (BID) | 11 refills | Status: AC

## 2019-12-10 MED ORDER — RIVAROXABAN 20 MG PO TABS: 20.0000 mg | ORAL_TABLET | Freq: Every day | ORAL | 0 refills | Status: DC

## 2019-12-10 MED ORDER — LISINOPRIL 10 MG PO TABS: 10.0000 mg | ORAL_TABLET | Freq: Every day | ORAL | 3 refills | Status: DC

## 2019-12-10 MED ORDER — INSULIN DETEMIR 100 UNIT/ML FLEXPEN: 20.0000 [IU] | PEN_INJECTOR | Freq: Every day | SUBCUTANEOUS | 11 refills | Status: DC

## 2019-12-10 MED ORDER — ACCU-CHEK GUIDE VI STRP: ORAL_STRIP | 12 refills | Status: DC

## 2019-12-10 NOTE — Progress Notes (Signed)
[place], [place]  [address] Phone:  [phone]   Fax:  [phone]   New Patient Office Visit  Subjective:  Patient ID: Virginia Fitzgerald, female    DOB: 1985-08-24  Age: 34 y.o. MRN: [id number]  CC:  Chief Complaint  Patient presents with  . Establish Care    HPI Virginia Fitzgerald presents to establish care. She  has a past medical history of Anemia, Bladder infection (03/2017), Diabetes mellitus without complication ([place]), GERD (gastroesophageal reflux disease), Headache, History of blood transfusion (2012), History of DVT of lower extremity, History of hiatal hernia, MRSA infection, Pulmonary embolism ([place]), S/P IVC filter, SVD (spontaneous vaginal delivery), and Thrombocytosis.   Diabetes Mellitus Patient presents with new onset of Type 2 diabetes. Current symptoms include: hyperglycemia and polydipsia. Symptoms have progressed to a point and plateaued. Patient denies foot ulcerations, hypoglycemia , increased appetite, nausea, paresthesia of the feet, polyuria, vomiting and weight loss. Evaluation to date has included: fasting blood sugar and hemoglobin A1C.  Home sugars: BGs have been labile ranging between 140 and 300. Current treatment: Continued insulin which has been too early to assess effectiveness. Last dilated eye exam unknown  Past Medical History:  Diagnosis Date  . Anemia   . Bladder infection 03/2017  . Diabetes mellitus without complication ([place])    WITH 2019 PREGNANCY ONLY  . GERD (gastroesophageal reflux disease)    diet controlled - no meds  . Headache    last one 05/25/17 - otc med  . History of blood transfusion 2012   At [facility]  . History of DVT of lower extremity   . History of hiatal hernia   . MRSA infection    not in EPIC - per patient in 2011  . Pulmonary embolism ([place])   . S/P IVC filter   . SVD (spontaneous vaginal delivery)    x 3  . Thrombocytosis     Past Surgical History:  Procedure  Laterality Date  . DILATION AND EVACUATION N/A 05/29/2017   Procedure: DILATATION AND EVACUATION;  Surgeon: Lavonia Drafts, MD;  Location: [facility];  Service: Gynecology;  Laterality: N/A;  . WISDOM TOOTH EXTRACTION      Family History  Problem Relation Age of Onset  . Diabetes Mother   . Hypertension Mother   . Hypertension Maternal Uncle   . Diabetes Maternal Uncle   . Hypertension Maternal Grandmother     Social History   Socioeconomic History  . Marital status: Single    Spouse name: Not on file  . Number of children: Not on file  . Years of education: Not on file  . Highest education level: Not on file  Occupational History  . Not on file  Tobacco Use  . Smoking status: Never Smoker  . Smokeless tobacco: Never Used  . Tobacco comment: never used tobacco  Vaping Use  . Vaping Use: Never used  Substance and Sexual Activity  . Alcohol use: No    Alcohol/week: 0.0 standard drinks  . Drug use: No  . Sexual activity: Not Currently    Birth control/protection: None  Other Topics Concern  . Not on file  Social History Narrative   ** Merged History Encounter **       Social Determinants of Health   Financial Resource Strain:   . Difficulty of Paying Living Expenses: Not on file  Food Insecurity:   . Worried About Charity fundraiser in the Last Year: Not on  file  . [place] in the Last Year: Not on file  Transportation Needs:   . Lack of Transportation (Medical): Not on file  . Lack of Transportation (Non-Medical): Not on file  Physical Activity:   . Days of Exercise per Week: Not on file  . Minutes of Exercise per Session: Not on file  Stress:   . Feeling of Stress : Not on file  Social Connections:   . Frequency of Communication with Friends and Family: Not on file  . Frequency of Social Gatherings with Friends and Family: Not on file  . Attends Religious Services: Not on file  . Active Member of Clubs or Organizations: Not on file  . Attends  Archivist Meetings: Not on file  . Marital Status: Not on file  Intimate Partner Violence:   . Fear of Current or Ex-Partner: Not on file  . Emotionally Abused: Not on file  . Physically Abused: Not on file  . Sexually Abused: Not on file    ROS Review of Systems  Constitutional: Negative.   HENT: Negative.   Eyes: Negative for visual disturbance.  Respiratory: Negative.   Cardiovascular:       Pleuritic chest pain  Gastrointestinal: Negative for constipation, diarrhea and nausea.  Endocrine: Positive for polydipsia. Negative for polyuria.       Noticed sweats denies fever  Genitourinary: Negative.  Negative for frequency.  Musculoskeletal: Positive for arthralgias (sometimes in her hands).  Skin: Positive for rash.  Allergic/Immunologic: Negative.   Neurological: Positive for headaches. Negative for dizziness and light-headedness.  Hematological: Negative.   Psychiatric/Behavioral: Negative.     Objective:   Today's Vitals: BP 120/80 (BP Location: Right Arm, Patient Position: Sitting, Cuff Size: Large)   Pulse (!) 105   Temp 97.9 F (36.6 C)   Resp 18   Ht '5\' 2"'  (1.575 m)   Wt 289 lb (131.1 kg)   LMP 12/09/2019 (Exact Date)   SpO2 100%   BMI 52.86 kg/m   Physical Exam Constitutional:      General: She is not in acute distress.    Appearance: She is obese. She is not ill-appearing, toxic-appearing or diaphoretic.  HENT:     Nose: Nose normal.     Mouth/Throat:     Mouth: Mucous membranes are moist.  Cardiovascular:     Rate and Rhythm: Normal rate and regular rhythm.     Pulses: Normal pulses.     Heart sounds: Normal heart sounds.  Pulmonary:     Effort: Pulmonary effort is normal.     Breath sounds: Normal breath sounds.  Abdominal:     General: Bowel sounds are normal.     Palpations: Abdomen is soft.  Musculoskeletal:        General: Normal range of motion.     Cervical back: Normal range of motion.  Feet:     Right foot:     Protective  Sensation: 10 sites tested. 10 sites sensed.     Skin integrity: Callus and dry skin present.     Left foot:     Protective Sensation: 10 sites tested. 10 sites sensed.     Skin integrity: Callus and dry skin present.  Skin:    General: Skin is warm and dry.     Capillary Refill: Capillary refill takes less than 2 seconds.  Neurological:     General: No focal deficit present.     Mental Status: She is alert. She is disoriented.  Psychiatric:        Mood and Affect: Mood normal.        Behavior: Behavior normal.        Thought Content: Thought content normal.        Judgment: Judgment normal.     Assessment & Plan:   Problem List Items Addressed This Visit      Cardiovascular and Mediastinum   Essential hypertension, benign Started on lisinopril 10 mg Discussed side effects with patient Encouraged home monitoring and recording BP <130/80 Eating a heart-healthy diet with less salt Encouraged regular physical activity  Recommend Weight loss      Relevant Medications   lisinopril (ZESTRIL) 10 MG tablet   rivaroxaban (XARELTO) 20 MG TABS tablet     Endocrine   Type 2 diabetes mellitus without complication, without long-term current use of insulin (HCC)   Relevant Medications Encourage compliance with current treatment regimenEncourage regular CBG monitoring Encourage contacting office if excessive hyperglycemia and or hypoglycemia Lifestyle modification with healthy diet (fewer calories, more high fiber foods, whole grains and non-starchy vegetables, lower fat meat and fish, low-fat diary include healthy oils) regular exercise (physical activity) and weight loss Opthalmology exam discussed will complete in the future  Nutritional consult recommended declined at this time Home BP monitoring also encouraged goal <130/80     lisinopril (ZESTRIL) 10 MG tablet   insulin detemir (LEVEMIR) 100 UNIT/ML FlexPen   Other Relevant Orders   Microalbumin, urine   Comp. Metabolic  Panel (12)    Other Visit Diagnoses    Establishing care with new doctor, encounter for    -  Primary   Relevant Orders   Urinalysis Dipstick (Completed)   POC Glucose (CBG) (Completed)   POC HgB A1c (Completed)   Diet controlled gestational diabetes mellitus (GDM) in first trimester       Relevant Medications   lisinopril (ZESTRIL) 10 MG tablet   insulin detemir (LEVEMIR) 100 UNIT/ML FlexPen   glucose blood (ACCU-CHEK GUIDE) test strip   Encounter for hepatitis C screening test for low risk patient       Relevant Orders   Hepatitis C antibody   Healthcare maintenance          Outpatient Encounter Medications as of 12/10/2019  Medication Sig  . ACCU-CHEK FASTCLIX LANCETS MISC 1 Device by Percutaneous route 4 (four) times daily.  Marland Kitchen acetaminophen (TYLENOL) 500 MG tablet Take 1,000 mg by mouth every 8 (eight) hours as needed for moderate pain.  . blood glucose meter kit and supplies Dispense based on patient and insurance preference. Use up to four times daily as directed. (FOR ICD-10 E10.9, E11.9).  Marland Kitchen Blood Glucose Monitoring Suppl (ACCU-CHEK GUIDE) w/Device KIT 1 Device by Does not apply route 4 (four) times daily.  Marland Kitchen glucose blood (ACCU-CHEK GUIDE) test strip Use as instructed QID  . insulin detemir (LEVEMIR) 100 UNIT/ML FlexPen Inject 20 Units into the skin daily.  . Insulin Pen Needle 29G X 26.7TI MISC 1 application by Does not apply route in the morning and at bedtime.  Marland Kitchen lisinopril (ZESTRIL) 10 MG tablet Take 1 tablet (10 mg total) by mouth daily.  . rivaroxaban (XARELTO) 20 MG TABS tablet Take 1 tablet (20 mg total) by mouth daily with supper.  Marland Kitchen ULTICARE INSULIN SYRINGE 30G X 1/2" 0.3 ML MISC See admin instructions. with insulin  . [DISCONTINUED] glucose blood (ACCU-CHEK GUIDE) test strip Use as instructed QID  . [DISCONTINUED] guaiFENesin-dextromethorphan (ROBITUSSIN DM) 100-10 MG/5ML syrup Take 10 mLs by  mouth every 4 (four) hours as needed for cough.  . [DISCONTINUED]  insulin detemir (LEVEMIR) 100 UNIT/ML injection Inject 0.2 mLs (20 Units total) into the skin 2 (two) times daily for 25 days.  . [DISCONTINUED] rivaroxaban (XARELTO) 20 MG TABS tablet Take 1 tablet (20 mg total) by mouth daily with supper.  . [DISCONTINUED] ROBAFEN DM CGH/CHEST CONGEST 10-100 MG/5ML liquid Take 10 mLs by mouth every 4 (four) hours as needed.   No facility-administered encounter medications on file as of 12/10/2019.    Follow-up: Return in about 2 months (around 02/09/2020).   Vevelyn Francois, NP

## 2019-12-10 NOTE — Patient Instructions (Signed)

## 2019-12-11 LAB — COMP. METABOLIC PANEL (12)
AST: 15 IU/L (ref 0–40)
Albumin/Globulin Ratio: 1.3 (ref 1.2–2.2)
Albumin: 3.8 g/dL (ref 3.8–4.8)
Alkaline Phosphatase: 98 IU/L (ref 44–121)
BUN/Creatinine Ratio: 17 (ref 9–23)
BUN: 13 mg/dL (ref 6–20)
Bilirubin Total: <0.2 mg/dL (ref 0.0–1.2)
Calcium: 9.8 mg/dL (ref 8.7–10.2)
Chloride: 105 mmol/L (ref 96–106)
Creatinine, Ser: 0.76 mg/dL (ref 0.57–1.00)
GFR calc Af Amer: 118 mL/min/{1.73_m2} (ref >59)
GFR calc non Af Amer: 103 mL/min/{1.73_m2} (ref >59)
Globulin, Total: 2.9 g/dL (ref 1.5–4.5)
Glucose: 252 mg/dL — ABNORMAL HIGH (ref 65–99)
Potassium: 3.9 mmol/L (ref 3.5–5.2)
Sodium: 140 mmol/L (ref 134–144)
Total Protein: 6.7 g/dL (ref 6.0–8.5)

## 2019-12-11 LAB — HEPATITIS C ANTIBODY: Hep C Virus Ab: <0.1 s/co ratio (ref 0.0–0.9)

## 2019-12-11 LAB — MICROALBUMIN, URINE: Microalbumin, Urine: 1452.2 ug/mL

## 2019-12-25 ENCOUNTER — Emergency Department (HOSPITAL_COMMUNITY)
Admission: EM | Admit: 2019-12-25 | Discharge: 2019-12-25 | Disposition: A | Discharge status: Home / Self Care | Payer: Medicaid Other | Attending: Emergency Medicine | Admitting: Emergency Medicine

## 2019-12-25 ENCOUNTER — Emergency Department (HOSPITAL_COMMUNITY): Payer: Medicaid Other

## 2019-12-25 ENCOUNTER — Encounter (HOSPITAL_COMMUNITY): Payer: Self-pay | Admitting: Emergency Medicine

## 2019-12-25 ENCOUNTER — Other Ambulatory Visit: Payer: Self-pay

## 2019-12-25 DIAGNOSIS — Z794 Long term (current) use of insulin: Secondary | ICD-10-CM | POA: Diagnosis not present

## 2019-12-25 DIAGNOSIS — R42 Dizziness and giddiness: Secondary | ICD-10-CM | POA: Diagnosis present

## 2019-12-25 DIAGNOSIS — E119 Type 2 diabetes mellitus without complications: Secondary | ICD-10-CM | POA: Diagnosis not present

## 2019-12-25 DIAGNOSIS — R000 Tachycardia, unspecified: Secondary | ICD-10-CM | POA: Insufficient documentation

## 2019-12-25 DIAGNOSIS — I10 Essential (primary) hypertension: Secondary | ICD-10-CM | POA: Diagnosis not present

## 2019-12-25 DIAGNOSIS — Z8616 Personal history of COVID-19: Secondary | ICD-10-CM | POA: Insufficient documentation

## 2019-12-25 DIAGNOSIS — R079 Chest pain, unspecified: Secondary | ICD-10-CM

## 2019-12-25 LAB — COMPREHENSIVE METABOLIC PANEL
ALT: 35 U/L (ref 0–44)
AST: 24 U/L (ref 15–41)
Albumin: 3.7 g/dL (ref 3.5–5.0)
Alkaline Phosphatase: 85 U/L (ref 38–126)
Anion gap: 15 (ref 5–15)
BUN: 10 mg/dL (ref 6–20)
CO2: 22 mmol/L (ref 22–32)
Calcium: 9.8 mg/dL (ref 8.9–10.3)
Chloride: 101 mmol/L (ref 98–111)
Creatinine, Ser: 0.74 mg/dL (ref 0.44–1.00)
GFR, Estimated: >60 mL/min (ref >60)
Glucose, Bld: 165 mg/dL — ABNORMAL HIGH (ref 70–99)
Potassium: 3.7 mmol/L (ref 3.5–5.1)
Sodium: 138 mmol/L (ref 135–145)
Total Bilirubin: 0.5 mg/dL (ref 0.3–1.2)
Total Protein: 7.7 g/dL (ref 6.5–8.1)

## 2019-12-25 LAB — I-STAT CHEM 8, ED
BUN: 10 mg/dL (ref 6–20)
Calcium, Ion: 1.16 mmol/L (ref 1.15–1.40)
Chloride: 103 mmol/L (ref 98–111)
Creatinine, Ser: 0.6 mg/dL (ref 0.44–1.00)
Glucose, Bld: 169 mg/dL — ABNORMAL HIGH (ref 70–99)
HCT: 40 % (ref 36.0–46.0)
Hemoglobin: 13.6 g/dL (ref 12.0–15.0)
Potassium: 3.4 mmol/L — ABNORMAL LOW (ref 3.5–5.1)
Sodium: 141 mmol/L (ref 135–145)
TCO2: 25 mmol/L (ref 22–32)

## 2019-12-25 LAB — CBC WITH DIFFERENTIAL/PLATELET
Abs Immature Granulocytes: 0.07 10*3/uL (ref 0.00–0.07)
Basophils Absolute: 0.1 10*3/uL (ref 0.0–0.1)
Basophils Relative: 1 %
Eosinophils Absolute: 0.3 10*3/uL (ref 0.0–0.5)
Eosinophils Relative: 2 %
HCT: 40.2 % (ref 36.0–46.0)
Hemoglobin: 11.9 g/dL — ABNORMAL LOW (ref 12.0–15.0)
Immature Granulocytes: 1 %
Lymphocytes Relative: 34 %
Lymphs Abs: 5 10*3/uL — ABNORMAL HIGH (ref 0.7–4.0)
MCH: 22.2 pg — ABNORMAL LOW (ref 26.0–34.0)
MCHC: 29.6 g/dL — ABNORMAL LOW (ref 30.0–36.0)
MCV: 75 fL — ABNORMAL LOW (ref 80.0–100.0)
Monocytes Absolute: 0.8 10*3/uL (ref 0.1–1.0)
Monocytes Relative: 5 %
Neutro Abs: 8.7 10*3/uL — ABNORMAL HIGH (ref 1.7–7.7)
Neutrophils Relative %: 57 %
Platelets: 509 10*3/uL — ABNORMAL HIGH (ref 150–400)
RBC: 5.36 MIL/uL — ABNORMAL HIGH (ref 3.87–5.11)
RDW: 18 % — ABNORMAL HIGH (ref 11.5–15.5)
WBC: 15 10*3/uL — ABNORMAL HIGH (ref 4.0–10.5)
nRBC: 0 % (ref 0.0–0.2)

## 2019-12-25 LAB — BLOOD GAS, VENOUS
Acid-Base Excess: 2.6 mmol/L — ABNORMAL HIGH (ref 0.0–2.0)
Bicarbonate: 27.1 mmol/L (ref 20.0–28.0)
FIO2: 21
O2 Saturation: 66.4 %
Patient temperature: 37
pCO2, Ven: 45.3 mmHg (ref 44.0–60.0)
pH, Ven: 7.394 (ref 7.250–7.430)
pO2, Ven: 37.1 mmHg (ref 32.0–45.0)

## 2019-12-25 LAB — URINALYSIS, ROUTINE W REFLEX MICROSCOPIC
Bacteria, UA: NONE SEEN
Bilirubin Urine: NEGATIVE
Glucose, UA: 50 mg/dL — AB
Hgb urine dipstick: NEGATIVE
Ketones, ur: NEGATIVE mg/dL
Leukocytes,Ua: NEGATIVE
Nitrite: NEGATIVE
Protein, ur: 100 mg/dL — AB
Specific Gravity, Urine: 1.014 (ref 1.005–1.030)
pH: 8 (ref 5.0–8.0)

## 2019-12-25 LAB — TROPONIN I (HIGH SENSITIVITY)
Troponin I (High Sensitivity): 5 ng/L (ref <18)
Troponin I (High Sensitivity): 5 ng/L (ref <18)

## 2019-12-25 LAB — I-STAT BETA HCG BLOOD, ED (MC, WL, AP ONLY): I-stat hCG, quantitative: <5 m[IU]/mL (ref <5)

## 2019-12-25 LAB — BRAIN NATRIURETIC PEPTIDE: B Natriuretic Peptide: 33.5 pg/mL (ref 0.0–100.0)

## 2019-12-25 MED ORDER — SODIUM CHLORIDE 0.9 % IV SOLN: Freq: Once | INTRAVENOUS | Status: AC

## 2019-12-25 MED ORDER — SODIUM CHLORIDE 0.9 % IV BOLUS
500.0000 mL | Freq: Once | INTRAVENOUS | Status: AC
  Administered 2019-12-25: 500 mL via INTRAVENOUS

## 2019-12-25 NOTE — ED Triage Notes (Signed)
Pt woke up and after using the bathroom became weak and noticed a rapid heart rate.  Reports "pounding in the back of my head."

## 2019-12-25 NOTE — ED Provider Notes (Signed)
Care received from Dr. Leonette Monarch 34 yo female ho dm, pe, presents with lightheadedness Work up here essentially normal except tachycardia with exertion. Physical Exam  BP 139/83   Pulse 99   Temp (!) 97.5 F (36.4 C) (Oral)   Resp 19   Ht 1.575 m (5\' 2" )   Wt 127.9 kg   LMP 12/09/2019 (Exact Date)   SpO2 97%   BMI 51.58 kg/m   Physical Exam  ED Course/Procedures     Procedures  MDM  Symptoms improved with iv fluids Awaiting second trop Probable d/c if second trop normal and sxs better after second bolus ns 9:02 AM Repeat troponin normal Patient feels improved Discussed oral hydration Return precautions and need for follow up reviewed and patient voices understanding       Virginia Boss, MD 12/25/19 [phone]

## 2019-12-25 NOTE — Discharge Instructions (Addendum)
PLease drink plenty of fluids Follow up with your doctor next week Return if you are having worsening symptoms especially worsening lightheadedness, chest pain, or shortness of breath.

## 2019-12-25 NOTE — ED Provider Notes (Signed)
[facility] EMERGENCY DEPARTMENT Provider Note  CSN: [id number] Arrival date & time: 12/25/19 0350  Chief Complaint(s) Weakness and Tachycardia  HPI Virginia Fitzgerald is a 34 y.o. female   The history is provided by the patient.  Near Syncope This is a new problem. Episode onset: <1 hr PTA. The problem occurs constantly. The problem has not changed since onset.Pertinent negatives include no chest pain, no abdominal pain, no headaches and no shortness of breath. Exacerbated by: changing to vertical positions. The symptoms are relieved by lying down. She has tried nothing for the symptoms.   She reports that her symptoms started after getting up from the toilet (voiding).  At that time, she felt lightheaded and had trouble walking. While walking to the car, she felt her heart start racing.   She had a BM earlier in the night. No melena, only streaky red blood on the TP.  She was recently treated for COVID and has completed her steroids. Her PCP just started her on lisinopril for HTN.  Patient endorsed eating salty foods for Thanksgiving.  LMP 12/04/19  Past Medical History Past Medical History:  Diagnosis Date  . Anemia   . Bladder infection 03/2017  . Diabetes mellitus without complication ([place])    WITH 2019 PREGNANCY ONLY  . GERD (gastroesophageal reflux disease)    diet controlled - no meds  . Headache    last one 05/25/17 - otc med  . History of blood transfusion 2012   At [facility]  . History of DVT of lower extremity   . History of hiatal hernia   . MRSA infection    not in EPIC - per patient in 2011  . Pulmonary embolism ([place])   . S/P IVC filter   . SVD (spontaneous vaginal delivery)    x 3  . Thrombocytosis    Patient Active Problem List   Diagnosis Date Noted  . History of COVID-19 11/25/2019  . Type 2 diabetes mellitus without complication, without long-term current use of insulin ([place]) 11/25/2019  . History of recurrent deep vein thrombosis  (DVT) 11/25/2019  . COVID-19 virus infection 11/10/2019  . Colitis 07/27/2017  . Missed abortion 05/29/2017  . Dysplasia of cervix, low grade (CIN 1) 03/28/2017  . Diabetes type 2, 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 ([place]) 03/28/2017  . Essential hypertension, benign 03/20/2017  . 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 BMI 50.0-59.9, adult ([place]) 02/28/2017  . Iron deficiency anemia 11/14/2011   Home Medication(s) Prior to Admission medications   Medication Sig Start Date End Date Taking? Authorizing Provider  ACCU-CHEK FASTCLIX LANCETS MISC 1 Device by Percutaneous route 4 (four) times daily. 03/28/17   Aletha Halim, MD  acetaminophen (TYLENOL) 500 MG tablet Take 1,000 mg by mouth every 8 (eight) hours as needed for moderate pain.    [provider]  blood glucose meter kit and supplies Dispense based on patient and insurance preference. Use up to four times daily as directed. (FOR ICD-10 E10.9, E11.9). 11/12/19   Cato Mulligan, MD  Blood Glucose Monitoring Suppl (ACCU-CHEK GUIDE) w/Device KIT 1 Device by Does not apply route 4 (four) times daily. 04/10/17   Donnamae Jude, MD  glucose blood (ACCU-CHEK GUIDE) test strip Use as instructed QID 12/10/19   Vevelyn Francois, NP  insulin detemir (LEVEMIR) 100 UNIT/ML FlexPen Inject 20 Units into the skin daily. 12/10/19   Vevelyn Francois, NP  Insulin Pen Needle 29G X 09.3GH MISC 1 application by Does not apply route in  the morning and at bedtime. 12/10/19 12/09/20  Vevelyn Francois, NP  lisinopril (ZESTRIL) 10 MG tablet Take 1 tablet (10 mg total) by mouth daily. 12/10/19   Vevelyn Francois, NP  rivaroxaban (XARELTO) 20 MG TABS tablet Take 1 tablet (20 mg total) by mouth daily with supper. 12/10/19   Vevelyn Francois, NP  Flossie Buffy INSULIN SYRINGE 30G X 1/2" 0.3 ML MISC See admin instructions. with insulin 11/12/19   [provider]                                                                                                                                     Past Surgical History Past Surgical History:  Procedure Laterality Date  . DILATION AND EVACUATION N/A 05/29/2017   Procedure: DILATATION AND EVACUATION;  Surgeon: Lavonia Drafts, MD;  Location: [facility];  Service: Gynecology;  Laterality: N/A;  . WISDOM TOOTH EXTRACTION     Family History Family History  Problem Relation Age of Onset  . Diabetes Mother   . Hypertension Mother   . Hypertension Maternal Uncle   . Diabetes Maternal Uncle   . Hypertension Maternal Grandmother     Social History Social History   Tobacco Use  . Smoking status: Never Smoker  . Smokeless tobacco: Never Used  . Tobacco comment: never used tobacco  Vaping Use  . Vaping Use: Never used  Substance Use Topics  . Alcohol use: No    Alcohol/week: 0.0 standard drinks  . Drug use: No   Allergies Patient has no known allergies.  Review of Systems Review of Systems  Respiratory: Negative for shortness of breath.   Cardiovascular: Positive for near-syncope. Negative for chest pain.  Gastrointestinal: Negative for abdominal pain.  Neurological: Negative for headaches.   All other systems are reviewed and are negative for acute change except as noted in the HPI  Physical Exam Vital Signs  I have reviewed the triage vital signs BP (!) 190/111 (BP Location: Right Arm)   Pulse (!) 125   Temp (!) 97.5 F (36.4 C) (Oral)   Resp 17   Ht '5\' 2"'  (1.575 m)   Wt 127.9 kg   LMP 12/09/2019 (Exact Date)   SpO2 100%   BMI 51.58 kg/m   Physical Exam Vitals reviewed.  Constitutional:      General: She is not in acute distress.    Appearance: She is well-developed. She is obese. She is not diaphoretic.  HENT:     Head: Normocephalic and atraumatic.     Nose: Nose normal.  Eyes:     General: No scleral icterus.       Right eye: No discharge.        Left eye: No discharge.     Conjunctiva/sclera: Conjunctivae normal.     Pupils: Pupils are equal, round, and reactive to  light.  Cardiovascular:     Rate and Rhythm: Regular rhythm. Tachycardia present.  Heart sounds: No murmur heard.  No friction rub. No gallop.   Pulmonary:     Effort: Pulmonary effort is normal. No respiratory distress.     Breath sounds: Normal breath sounds. No stridor. No rales.  Abdominal:     General: There is no distension.     Palpations: Abdomen is soft.     Tenderness: There is no abdominal tenderness.  Musculoskeletal:        General: No tenderness.     Cervical back: Normal range of motion and neck supple.     Right lower leg: No edema.     Left lower leg: No edema.  Skin:    General: Skin is warm and dry.     Findings: No erythema or rash.  Neurological:     Mental Status: She is alert and oriented to person, place, and time.     ED Results and Treatments Labs (all labs ordered are listed, but only abnormal results are displayed) Labs Reviewed  CBC WITH DIFFERENTIAL/PLATELET - Abnormal; Notable for the following components:      Result Value   WBC 15.0 (*)    RBC 5.36 (*)    Hemoglobin 11.9 (*)    MCV 75.0 (*)    MCH 22.2 (*)    MCHC 29.6 (*)    RDW 18.0 (*)    Platelets 509 (*)    Neutro Abs 8.7 (*)    Lymphs Abs 5.0 (*)    All other components within normal limits  COMPREHENSIVE METABOLIC PANEL - Abnormal; Notable for the following components:   Glucose, Bld 165 (*)    All other components within normal limits  URINALYSIS, ROUTINE W REFLEX MICROSCOPIC - Abnormal; Notable for the following components:   Color, Urine STRAW (*)    APPearance HAZY (*)    Glucose, UA 50 (*)    Protein, ur 100 (*)    All other components within normal limits  BLOOD GAS, VENOUS - Abnormal; Notable for the following components:   Acid-Base Excess 2.6 (*)    All other components within normal limits  I-STAT CHEM 8, ED - Abnormal; Notable for the following components:   Potassium 3.4 (*)    Glucose, Bld 169 (*)    All other components within normal limits  BRAIN  NATRIURETIC PEPTIDE  I-STAT BETA HCG BLOOD, ED ([facility], [facility], [facility] ONLY)  TROPONIN I (HIGH SENSITIVITY)  TROPONIN I (HIGH SENSITIVITY)                                                                                                                         EKG  EKG Interpretation  Date/Time:  Friday December 25 2019 05:21:32 EST Ventricular Rate:  123 PR Interval:    QRS Duration: 82 QT Interval:  351 QTC Calculation: 503 R Axis:     Text Interpretation: Sinus tachycardia Low voltage, precordial leads Nonspecific T abnormalities, anterior leads Prolonged QT interval No acute changes Confirmed by Addison Lank [phone]) on  12/25/2019 5:24:12 AM      Radiology DG Chest 2 View  Result Date: 12/25/2019 CLINICAL DATA:  Weakness and rapid heart rate. EXAM: CHEST - 2 VIEW COMPARISON:  11/25/2019 FINDINGS: Shallow inspiration with atelectasis in the lung bases. Cardiac enlargement with central pulmonary vascular congestion. No pleural effusions or pneumothorax. Similar appearance to previous study. IMPRESSION: Cardiac enlargement with pulmonary vascular congestion. Shallow inspiration with atelectasis in the lung bases. Electronically Signed   By: Lucienne Capers M.D.   On: 12/25/2019 05:55    Pertinent labs & imaging results that were available during my care of the patient were reviewed by me and considered in my medical decision making (see chart for details).  Medications Ordered in ED Medications  0.9 %  sodium chloride infusion ( Intravenous New Bag/Given 12/25/19 0530)  sodium chloride 0.9 % bolus 500 mL (500 mLs Intravenous New Bag/Given 12/25/19 3235)                                                                                                                                    Procedures Procedures  (including critical care time)  Medical Decision Making / ED Course I have reviewed the nursing notes for this encounter and the patient's prior records (if available in EHR or on  provided paperwork).   Royelle Prisk was evaluated in Emergency Department on 12/25/2019 for the symptoms described in the history of present illness. She was evaluated in the context of the global COVID-19 pandemic, which necessitated consideration that the patient might be at risk for infection with the SARS-CoV-2 virus that causes COVID-19. Institutional protocols and algorithms that pertain to the evaluation of patients at risk for COVID-19 are in a state of rapid change based on information released by regulatory bodies including the CDC and federal and state organizations. These policies and algorithms were followed during the patient's care in the ED.  Patient presents with near syncope. Appears to be orthostatic. She is hypertensive and tachycardic. -Possibly related to high salt intake during the holiday festivities. She denied any alcohol consumption or drug use. No chest pain or shortness of breath. EKG with sinus tachycardia, no ischemic changes, or dysrhythmias. initial trop negative. Will get delta.  Patient is on Xarelto and endorsed streaking of blood.  No melena or large volume hematochezia.  Hemoglobin is stable. Doubt GI Bleed as the source.  No evidence of hyperglycemic crisis.  No infectious symptoms or source.  No electrolyte derangement or renal insufficiency.  hcg negative.  Possibly symptomatic from elevated BP in the setting of high salt intake. She was provided with 1L IVF, which improved her lightheadedness while sitting. Still had some while ambulating. Will provide with additional 500cc.  Patient's HTN and tachycardia resolved with the initial 1L IVF as well.  Plan for delta trop and additional IVF.  Patient care turned over to Dr Jeanell Sparrow. Patient case and results discussed in detail; please see  their note for further ED managment.        Final Clinical Impression(s) / ED Diagnoses Final diagnoses:  Tachycardia  Lightheadedness      This chart  was dictated using voice recognition software.  Despite best efforts to proofread,  errors can occur which can change the documentation meaning.   Fatima Blank, MD 12/25/19 [phone]

## 2019-12-28 ENCOUNTER — Ambulatory Visit (INDEPENDENT_AMBULATORY_CARE_PROVIDER_SITE_OTHER): Payer: Medicaid Other | Admitting: Nurse Practitioner

## 2019-12-28 VITALS — BP 132/82 | HR 107 | Temp 97.3°F | Ht 62.0 in | Wt 285.0 lb

## 2019-12-28 DIAGNOSIS — I2699 Other pulmonary embolism without acute cor pulmonale: Secondary | ICD-10-CM | POA: Diagnosis not present

## 2019-12-28 DIAGNOSIS — Z8616 Personal history of COVID-19: Secondary | ICD-10-CM

## 2019-12-28 DIAGNOSIS — R9389 Abnormal findings on diagnostic imaging of other specified body structures: Secondary | ICD-10-CM | POA: Diagnosis not present

## 2019-12-28 NOTE — Progress Notes (Signed)
'@Patient'  ID: Virginia Fitzgerald, female    DOB: 10-11-1985, 34 y.o.   MRN: [id number]  Chief Complaint  Patient presents with  . Follow-up    Feeling better, still having slight cough    Referring provider: No ref. provider found   34 year old female with history of hypertension, recurrent pulmonary embolism, history of DVT distal lower extremity, obesity.  HPI  Patient presents today for post COVID care follow-up.  Patient was admitted to the hospital on 11/09/2019 and discharged on 11/12/2019.  Patient was admitted for COVID and respiratory failure.    Patient was last seen in our office on 11/25/2019.  She was set up with a new PCP to establish care for management of chronic PEs and newly diagnosed diabetes from hospital.  She has seen Dionisio David, NP for this.  Patient was seen in the ED on 12/25/2019 for issues with tachycardia and presyncope which was thought to be from elevated sodium intake over the holiday weekend.  Patient was given fluids and did feel better after that and was discharged home.  Patient states that she has been doing well since that time.  She states that she does still have a slight cough.  Her chest x-ray while in the ED did show cardiac enlargement and pulmonary vascular congestion. Denies f/c/s, n/v/d, hemoptysis, PND, chest pain or edema.      No Known Allergies  Immunization History  Administered Date(s) Administered  . Tdap 12/10/2019    Past Medical History:  Diagnosis Date  . Anemia   . Bladder infection 03/2017  . Diabetes mellitus without complication ([place])    WITH 2019 PREGNANCY ONLY  . GERD (gastroesophageal reflux disease)    diet controlled - no meds  . Headache    last one 05/25/17 - otc med  . History of blood transfusion 2012   At [facility]  . History of DVT of lower extremity   . History of hiatal hernia   . MRSA infection    not in EPIC - per patient in 2011  . Pulmonary embolism ([place])   . S/P IVC filter   . SVD (spontaneous  vaginal delivery)    x 3  . Thrombocytosis     Tobacco History: Social History   Tobacco Use  Smoking Status Never Smoker  Smokeless Tobacco Never Used  Tobacco Comment   never used tobacco   Counseling given: Not Answered Comment: never used tobacco   Outpatient Encounter Medications as of 12/28/2019  Medication Sig  . ACCU-CHEK FASTCLIX LANCETS MISC 1 Device by Percutaneous route 4 (four) times daily.  Marland Kitchen acetaminophen (TYLENOL) 500 MG tablet Take 1,000 mg by mouth every 8 (eight) hours as needed for moderate pain.  . blood glucose meter kit and supplies Dispense based on patient and insurance preference. Use up to four times daily as directed. (FOR ICD-10 E10.9, E11.9).  Marland Kitchen Blood Glucose Monitoring Suppl (ACCU-CHEK GUIDE) w/Device KIT 1 Device by Does not apply route 4 (four) times daily.  Marland Kitchen glucose blood (ACCU-CHEK GUIDE) test strip Use as instructed QID  . insulin detemir (LEVEMIR) 100 UNIT/ML FlexPen Inject 20 Units into the skin daily.  . Insulin Pen Needle 29G X 73.7TG MISC 1 application by Does not apply route in the morning and at bedtime.  Marland Kitchen lisinopril (ZESTRIL) 10 MG tablet Take 1 tablet (10 mg total) by mouth daily.  . rivaroxaban (XARELTO) 20 MG TABS tablet Take 1 tablet (20 mg total) by mouth daily with supper.  Marland Kitchen ULTICARE INSULIN  SYRINGE 30G X 1/2" 0.3 ML MISC See admin instructions. with insulin   No facility-administered encounter medications on file as of 12/28/2019.     Review of Systems  Review of Systems  Constitutional: Negative.  Negative for fatigue and fever.  HENT: Negative.   Respiratory: Positive for cough. Negative for shortness of breath.   Cardiovascular: Negative.  Negative for chest pain, palpitations and leg swelling.  Gastrointestinal: Negative.   Allergic/Immunologic: Negative.   Neurological: Negative.   Psychiatric/Behavioral: Negative.        Physical Exam  BP 132/82 (BP Location: Left Arm)   Pulse (!) 107   Temp (!) 97.3 F  (36.3 C)   Ht '5\' 2"'  (1.575 m)   Wt 285 lb 0.1 oz (129.3 kg)   LMP 12/09/2019 (Exact Date)   SpO2 98%   BMI 52.13 kg/m   Wt Readings from Last 5 Encounters:  12/28/19 285 lb 0.1 oz (129.3 kg)  12/25/19 282 lb (127.9 kg)  12/10/19 289 lb (131.1 kg)  11/25/19 281 lb 0.1 oz (127.5 kg)  11/09/19 290 lb (131.5 kg)     Physical Exam Vitals and nursing note reviewed.  Constitutional:      General: She is not in acute distress.    Appearance: She is well-developed.  Cardiovascular:     Rate and Rhythm: Normal rate and regular rhythm.  Pulmonary:     Effort: Pulmonary effort is normal.     Breath sounds: Normal breath sounds.  Musculoskeletal:     Right lower leg: No edema.     Left lower leg: No edema.  Neurological:     Mental Status: She is alert and oriented to person, place, and time.  Psychiatric:        Mood and Affect: Mood normal.        Behavior: Behavior normal.       Imaging: DG Chest 2 View  Result Date: 12/25/2019 CLINICAL DATA:  Weakness and rapid heart rate. EXAM: CHEST - 2 VIEW COMPARISON:  11/25/2019 FINDINGS: Shallow inspiration with atelectasis in the lung bases. Cardiac enlargement with central pulmonary vascular congestion. No pleural effusions or pneumothorax. Similar appearance to previous study. IMPRESSION: Cardiac enlargement with pulmonary vascular congestion. Shallow inspiration with atelectasis in the lung bases. Electronically Signed   By: Lucienne Capers M.D.   On: 12/25/2019 05:55     Assessment & Plan:   History of COVID-19 Cough:   Stay well hydrated  Stay active  Deep breathing exercises  May start vitamin C 2,000 mg daily, vitamin D3 2,000 IU daily, Zinc 220 mg daily, and Quercetin 500 mg twice daily  May take tylenol or fever or pain  May take mucinex DM twice daily  Last chest xray showed cardiac enlargement and pulmonary vascular congestion - will refer to cardiology - may need follow up echo - recent BNP  WNL   Recurrent PE:  Continue Xarelto   Follow up:  Follow up if needed      Fenton Foy, NP 12/28/2019

## 2019-12-28 NOTE — Patient Instructions (Addendum)
Covid 19 Cough:   Stay well hydrated  Stay active  Deep breathing exercises  May start vitamin C 2,000 mg daily, vitamin D3 2,000 IU daily, Zinc 220 mg daily, and Quercetin 500 mg twice daily  May take tylenol or fever or pain  May take mucinex DM twice daily  Last chest xray showed cardiac enlargement and pulmonary vascular congestion - will refer to cardiology - may need follow up echo - recent BNP WNL   Recurrent PE:  Continue Xarelto   Follow up:  Follow up if needed

## 2019-12-28 NOTE — Assessment & Plan Note (Signed)
Cough:   Stay well hydrated  Stay active  Deep breathing exercises  May start vitamin C 2,000 mg daily, vitamin D3 2,000 IU daily, Zinc 220 mg daily, and Quercetin 500 mg twice daily  May take tylenol or fever or pain  May take mucinex DM twice daily  Last chest xray showed cardiac enlargement and pulmonary vascular congestion - will refer to cardiology - may need follow up echo - recent BNP WNL   Recurrent PE:  Continue Xarelto   Follow up:  Follow up if needed

## 2020-01-04 ENCOUNTER — Encounter: Payer: Self-pay | Admitting: General Practice

## 2020-01-05 ENCOUNTER — Other Ambulatory Visit: Payer: Self-pay

## 2020-01-05 ENCOUNTER — Emergency Department (HOSPITAL_COMMUNITY)
Admission: EM | Admit: 2020-01-05 | Discharge: 2020-01-05 | Disposition: A | Discharge status: Home / Self Care | Payer: Medicaid Other | Attending: Emergency Medicine | Admitting: Emergency Medicine

## 2020-01-05 ENCOUNTER — Emergency Department (HOSPITAL_COMMUNITY): Payer: Medicaid Other

## 2020-01-05 ENCOUNTER — Encounter (HOSPITAL_COMMUNITY): Payer: Self-pay | Admitting: Emergency Medicine

## 2020-01-05 DIAGNOSIS — Z79899 Other long term (current) drug therapy: Secondary | ICD-10-CM | POA: Diagnosis not present

## 2020-01-05 DIAGNOSIS — Z7901 Long term (current) use of anticoagulants: Secondary | ICD-10-CM | POA: Insufficient documentation

## 2020-01-05 DIAGNOSIS — D72829 Elevated white blood cell count, unspecified: Secondary | ICD-10-CM | POA: Diagnosis not present

## 2020-01-05 DIAGNOSIS — E119 Type 2 diabetes mellitus without complications: Secondary | ICD-10-CM | POA: Diagnosis not present

## 2020-01-05 DIAGNOSIS — Z8616 Personal history of COVID-19: Secondary | ICD-10-CM | POA: Insufficient documentation

## 2020-01-05 DIAGNOSIS — R079 Chest pain, unspecified: Secondary | ICD-10-CM | POA: Insufficient documentation

## 2020-01-05 DIAGNOSIS — Z794 Long term (current) use of insulin: Secondary | ICD-10-CM | POA: Insufficient documentation

## 2020-01-05 DIAGNOSIS — I10 Essential (primary) hypertension: Secondary | ICD-10-CM | POA: Diagnosis not present

## 2020-01-05 LAB — BASIC METABOLIC PANEL
Anion gap: 11 (ref 5–15)
BUN: 10 mg/dL (ref 6–20)
CO2: 24 mmol/L (ref 22–32)
Calcium: 9.5 mg/dL (ref 8.9–10.3)
Chloride: 104 mmol/L (ref 98–111)
Creatinine, Ser: 0.72 mg/dL (ref 0.44–1.00)
GFR, Estimated: >60 mL/min (ref >60)
Glucose, Bld: 150 mg/dL — ABNORMAL HIGH (ref 70–99)
Potassium: 3.6 mmol/L (ref 3.5–5.1)
Sodium: 139 mmol/L (ref 135–145)

## 2020-01-05 LAB — CBC
HCT: 37.3 % (ref 36.0–46.0)
Hemoglobin: 11 g/dL — ABNORMAL LOW (ref 12.0–15.0)
MCH: 21.9 pg — ABNORMAL LOW (ref 26.0–34.0)
MCHC: 29.5 g/dL — ABNORMAL LOW (ref 30.0–36.0)
MCV: 74.2 fL — ABNORMAL LOW (ref 80.0–100.0)
Platelets: 443 10*3/uL — ABNORMAL HIGH (ref 150–400)
RBC: 5.03 MIL/uL (ref 3.87–5.11)
RDW: 17.8 % — ABNORMAL HIGH (ref 11.5–15.5)
WBC: 13.4 10*3/uL — ABNORMAL HIGH (ref 4.0–10.5)
nRBC: 0 % (ref 0.0–0.2)

## 2020-01-05 LAB — TROPONIN I (HIGH SENSITIVITY)
Troponin I (High Sensitivity): 5 ng/L (ref <18)
Troponin I (High Sensitivity): 4 ng/L (ref <18)

## 2020-01-05 LAB — I-STAT BETA HCG BLOOD, ED (MC, WL, AP ONLY): I-stat hCG, quantitative: <5 m[IU]/mL (ref <5)

## 2020-01-05 MED ORDER — LACTATED RINGERS IV BOLUS: 1000.0000 mL | Freq: Once | INTRAVENOUS | Status: DC

## 2020-01-05 MED ORDER — ALUM & MAG HYDROXIDE-SIMETH 200-200-20 MG/5ML PO SUSP
30.0000 mL | Freq: Once | ORAL | Status: AC
  Administered 2020-01-05: 30 mL via ORAL
  Filled 2020-01-05: qty 30

## 2020-01-05 MED ORDER — FAMOTIDINE 20 MG PO TABS: 20.0000 mg | ORAL_TABLET | Freq: Every day | ORAL | 0 refills | Status: DC

## 2020-01-05 MED ORDER — LISINOPRIL 10 MG PO TABS
10.0000 mg | ORAL_TABLET | Freq: Once | ORAL | Status: AC
  Administered 2020-01-05: 10 mg via ORAL
  Filled 2020-01-05: qty 1

## 2020-01-05 MED ORDER — DICYCLOMINE HCL 10 MG PO CAPS
20.0000 mg | ORAL_CAPSULE | Freq: Once | ORAL | Status: AC
  Administered 2020-01-05: 20 mg via ORAL
  Filled 2020-01-05: qty 2

## 2020-01-05 NOTE — ED Triage Notes (Signed)
Patient from work with bilateral chest pressure.  She denies any nausea or vomiting.  No shortness of breath.

## 2020-01-05 NOTE — ED Notes (Signed)
RN notified about vitals 

## 2020-01-05 NOTE — ED Provider Notes (Signed)
[facility] EMERGENCY DEPARTMENT Provider Note   CSN: [id number] Arrival date & time: 01/05/20  0418     History Chief Complaint  Patient presents with  . Chest Pain    Virginia Fitzgerald is a 34 y.o. female.  HPI Patient is a 34 year old female with past medical history significant for Covid infection hospitalization this year, DM 2, DVT/PE on DOAC, HTN, obesity, and deficiency anemia  Patient is presented today with chief complaint of breast pain of the left breast.  She states that her left breast pain seems to radiate into her chest and across to her right side of her chest.  She states it feels achy.  She denies any nausea, vomiting, diaphoresis, shortness of breath. She denies any lightheadedness or dizziness.  She denies any abdominal pain.  She states she has a history of reflux but has never been treated for this.  She states that the pain is not exertional seems to feel worse after eating better with laying on her left side.  Mild cough  No other associated symptoms.  No fevers, no unilateral bilateral leg swelling.  She states she has been taking her blood thinner as prescribed and has no missed doses.    Past Medical History:  Diagnosis Date  . Anemia   . Bladder infection 03/2017  . Diabetes mellitus without complication ([place])    WITH 2019 PREGNANCY ONLY  . GERD (gastroesophageal reflux disease)    diet controlled - no meds  . Headache    last one 05/25/17 - otc med  . History of blood transfusion 2012   At [facility]  . History of DVT of lower extremity   . History of hiatal hernia   . MRSA infection    not in EPIC - per patient in 2011  . Pulmonary embolism ([place])   . S/P IVC filter   . SVD (spontaneous vaginal delivery)    x 3  . Thrombocytosis     Patient Active Problem List   Diagnosis Date Noted  . Abnormal chest x-ray 12/28/2019  . History of COVID-19 11/25/2019  . Type 2 diabetes mellitus without complication, without long-term current  use of insulin ([place]) 11/25/2019  . History of recurrent deep vein thrombosis (DVT) 11/25/2019  . COVID-19 virus infection 11/10/2019  . Colitis 07/27/2017  . Missed abortion 05/29/2017  . Dysplasia of cervix, low grade (CIN 1) 03/28/2017  . Diabetes type 2, 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 ([place]) 03/28/2017  . Essential hypertension, benign 03/20/2017  . Recurrent pulmonary embolism ([place]) 03/20/2017  . BMI 50.0-59.9, adult ([place]) 02/28/2017  . Iron deficiency anemia 11/14/2011    Past Surgical History:  Procedure Laterality Date  . DILATION AND EVACUATION N/A 05/29/2017   Procedure: DILATATION AND EVACUATION;  Surgeon: Lavonia Drafts, MD;  Location: [facility];  Service: Gynecology;  Laterality: N/A;  . WISDOM TOOTH EXTRACTION       OB History    Gravida  5   Para  3   Term  3   Preterm  0   AB  2   Living  3     SAB  2   TAB  0   Ectopic  0   Multiple      Live Births  3        Obstetric Comments  Largest prior was 7lbs 7oz. No issues with deliveries.         Family History  Problem Relation Age of Onset  . Diabetes Mother   .  Hypertension Mother   . Hypertension Maternal Uncle   . Diabetes Maternal Uncle   . Hypertension Maternal Grandmother     Social History   Tobacco Use  . Smoking status: Never Smoker  . Smokeless tobacco: Never Used  . Tobacco comment: never used tobacco  Vaping Use  . Vaping Use: Never used  Substance Use Topics  . Alcohol use: No    Alcohol/week: 0.0 standard drinks  . Drug use: No    Home Medications Prior to Admission medications   Medication Sig Start Date End Date Taking? Authorizing Provider  ACCU-CHEK FASTCLIX LANCETS MISC 1 Device by Percutaneous route 4 (four) times daily. 03/28/17   Aletha Halim, MD  acetaminophen (TYLENOL) 500 MG tablet Take 1,000 mg by mouth every 8 (eight) hours as needed for moderate pain.    [provider]  blood glucose meter kit and supplies Dispense based on patient and  insurance preference. Use up to four times daily as directed. (FOR ICD-10 E10.9, E11.9). 11/12/19   Cato Mulligan, MD  Blood Glucose Monitoring Suppl (ACCU-CHEK GUIDE) w/Device KIT 1 Device by Does not apply route 4 (four) times daily. 04/10/17   Donnamae Jude, MD  famotidine (PEPCID) 20 MG tablet Take 1 tablet (20 mg total) by mouth daily. 01/05/20   Tedd Sias, PA  glucose blood (ACCU-CHEK GUIDE) test strip Use as instructed QID 12/10/19   Vevelyn Francois, NP  insulin detemir (LEVEMIR) 100 UNIT/ML FlexPen Inject 20 Units into the skin daily. 12/10/19   Vevelyn Francois, NP  Insulin Pen Needle 29G X 79.3JQ MISC 1 application by Does not apply route in the morning and at bedtime. 12/10/19 12/09/20  Vevelyn Francois, NP  lisinopril (ZESTRIL) 10 MG tablet Take 1 tablet (10 mg total) by mouth daily. 12/10/19   Vevelyn Francois, NP  rivaroxaban (XARELTO) 20 MG TABS tablet Take 1 tablet (20 mg total) by mouth daily with supper. 12/10/19   Vevelyn Francois, NP  Flossie Buffy INSULIN SYRINGE 30G X 1/2" 0.3 ML MISC See admin instructions. with insulin 11/12/19   [provider]    Allergies    Patient has no known allergies.  Review of Systems   Review of Systems  Constitutional: Positive for fatigue. Negative for chills and fever.  HENT: Negative for congestion.   Eyes: Negative for pain.  Respiratory: Positive for cough. Negative for shortness of breath.   Cardiovascular: Positive for chest pain. Negative for leg swelling.  Gastrointestinal: Negative for abdominal pain and vomiting.  Genitourinary: Negative for dysuria.  Musculoskeletal: Negative for myalgias.  Skin: Negative for rash.  Neurological: Negative for dizziness and headaches.    Physical Exam Updated Vital Signs BP (!) 162/89   Pulse 82   Temp 98.3 F (36.8 C) (Oral)   Resp 20   Ht '5\' 2"'  (1.575 m)   Wt 129.7 kg   LMP 12/09/2019 (Exact Date)   SpO2 100%   BMI 52.30 kg/m   Physical Exam Vitals and nursing note  reviewed.  Constitutional:      General: She is not in acute distress.    Appearance: She is obese.     Comments: Pleasant well-appearing 34 year old.  In no acute distress.  Sitting comfortably in bed.  Able answer questions appropriately follow commands. No increased work of breathing. Speaking in full sentences.  HENT:     Head: Normocephalic and atraumatic.     Nose: Nose normal.     Mouth/Throat:  Mouth: Mucous membranes are moist.  Eyes:     General: No scleral icterus. Neck:     Comments: No JVD Cardiovascular:     Rate and Rhythm: Normal rate and regular rhythm.     Pulses: Normal pulses.     Heart sounds: Normal heart sounds.     Comments: No murmurs rubs or gallops Pulmonary:     Effort: Pulmonary effort is normal. No respiratory distress.     Breath sounds: Normal breath sounds. No wheezing.  Abdominal:     Palpations: Abdomen is soft.     Tenderness: There is no abdominal tenderness. There is no right CVA tenderness, left CVA tenderness, guarding or rebound.     Comments: Soft obese nontender abdomen   Musculoskeletal:     Cervical back: Normal range of motion.     Right lower leg: No edema.     Left lower leg: No edema.  Skin:    General: Skin is warm and dry.     Capillary Refill: Capillary refill takes less than 2 seconds.  Neurological:     Mental Status: She is alert. Mental status is at baseline.  Psychiatric:        Mood and Affect: Mood normal.        Behavior: Behavior normal.     ED Results / Procedures / Treatments   Labs (all labs ordered are listed, but only abnormal results are displayed) Labs Reviewed  BASIC METABOLIC PANEL - Abnormal; Notable for the following components:      Result Value   Glucose, Bld 150 (*)    All other components within normal limits  CBC - Abnormal; Notable for the following components:   WBC 13.4 (*)    Hemoglobin 11.0 (*)    MCV 74.2 (*)    MCH 21.9 (*)    MCHC 29.5 (*)    RDW 17.8 (*)    Platelets 443  (*)    All other components within normal limits  I-STAT BETA HCG BLOOD, ED ([facility], [facility], [facility] ONLY)  TROPONIN I (HIGH SENSITIVITY)  TROPONIN I (HIGH SENSITIVITY)    EKG None  Radiology DG Chest 2 View  Result Date: 01/05/2020 CLINICAL DATA:  Chest pain EXAM: CHEST - 2 VIEW COMPARISON:  12/25/2019 FINDINGS: The heart size and mediastinal contours are within normal limits. Both lungs are clear. The visualized skeletal structures are unremarkable. IMPRESSION: No evidence of active disease. Electronically Signed   By: Monte Fantasia M.D.   On: 01/05/2020 05:12    Procedures Procedures (including critical care time)  Medications Ordered in ED Medications  lisinopril (ZESTRIL) tablet 10 mg (10 mg Oral Given 01/05/20 1238)  alum & mag hydroxide-simeth (MAALOX/MYLANTA) 200-200-20 MG/5ML suspension 30 mL (30 mLs Oral Given 01/05/20 1238)  dicyclomine (BENTYL) capsule 20 mg (20 mg Oral Given 01/05/20 1238)    ED Course  I have reviewed the triage vital signs and the nursing notes.  Pertinent labs & imaging results that were available during my care of the patient were reviewed by me and considered in my medical decision making (see chart for details).    MDM Rules/Calculators/A&P                          Patient is a 34 year old female she is presented today with chest pain that is more of "breast pain "  She has no tenderness of her chest wall or breast.  Rest is not appear infected there  is no warmth redness or swelling.  She has received cardiac work-up which is reasonable given that she is also getting some pain radiating from her breast into her chest.  No fevers or chills.  She is not tachycardic my examination nor is she febrile.  She has not received any of her antihypertensive medicines today because she has been in the ER for approximately 9 hours prior to my arrival in the room.  No other significant symptoms.  Physical exam is unremarkable.  As she is concerned for reflux I did  provide her a GI cocktail and on my reassessment she is no longer having any symptoms.  Troponin x2 within normal limits. I-STAT hCG negative for pregnancy CBC with mild leukocytosis mild stable anemia.  Do not have explanation for her leukocytosis she does not have any systemic infectious symptoms I have low suspicion for her infection causing her symptoms today her chest x-ray is negative for any evidence of infection.  She will follow up with her primary care doctor for reevaluation.  She additionally has a cardiology appointment tomorrow. BMP without any significant abnormalities.  Chest x-ray reviewed by myself shows no acute disease EKG shows sinus tachycardia significant axis deviation.  I have low suspicion for ACS as she is following up with cardiology tomorrow.  Also low suspicion for PE given that she is anticoagulated.  She will monitor her symptoms.  Patient discharged with famotidine for reflux.  She will also continue take her blood pressure medicine and follow-up with her cardiologist tomorrow by her primary care doctor later this week or early next week.  She was given strict return precautions she is understanding of plan she feels well at this time and would like to be discharged.  I see no reason not to do this at this time however I did counsel her on the need for her to monitor her symptoms.  Final Clinical Impression(s) / ED Diagnoses Final diagnoses:  Chest pain, unspecified type    Rx / DC Orders ED Discharge Orders         Ordered    famotidine (PEPCID) 20 MG tablet  Daily        01/05/20 1350           Pati Gallo [place], [place] 01/05/20 1524    Dorie Rank, MD 01/06/20 [phone]

## 2020-01-05 NOTE — Discharge Instructions (Signed)
Please follow-up with your cardiologist tomorrow.  Please discuss your episode of chest pain today.  Please drink plenty of water.  Please follow-up with your primary care doctor as well to discuss rest of your medical care.  Please return to the ED for any new or concerning symptoms.

## 2020-01-06 ENCOUNTER — Ambulatory Visit: Payer: Medicaid Other | Admitting: Internal Medicine

## 2020-01-11 ENCOUNTER — Encounter: Payer: Self-pay | Admitting: Nurse Practitioner

## 2020-01-14 ENCOUNTER — Encounter (HOSPITAL_COMMUNITY): Payer: Self-pay | Admitting: Emergency Medicine

## 2020-01-14 ENCOUNTER — Other Ambulatory Visit: Payer: Self-pay

## 2020-01-14 ENCOUNTER — Ambulatory Visit (HOSPITAL_COMMUNITY)
Admission: EM | Admit: 2020-01-14 | Discharge: 2020-01-14 | Disposition: A | Discharge status: Home / Self Care | Payer: Medicaid Other | Attending: Emergency Medicine | Admitting: Emergency Medicine

## 2020-01-14 DIAGNOSIS — R438 Other disturbances of smell and taste: Secondary | ICD-10-CM | POA: Diagnosis present

## 2020-01-14 DIAGNOSIS — T7840XA Allergy, unspecified, initial encounter: Secondary | ICD-10-CM | POA: Diagnosis not present

## 2020-01-14 HISTORY — DX: Other disturbances of smell and taste: R43.8

## 2020-01-14 MED ORDER — CETIRIZINE HCL 10 MG PO TABS: 10.0000 mg | ORAL_TABLET | Freq: Every day | ORAL | 0 refills | Status: DC

## 2020-01-14 NOTE — ED Provider Notes (Signed)
[facility]    CSN: [id number] Arrival date & time: 01/14/20  0808      History   Chief Complaint Chief Complaint  Patient presents with  . off balance, odd taste    HPI Virginia Fitzgerald is a 34 y.o. female.   34 year old African-American female presents to urgent care today with chief complaint of metal on taste in mouth x4 days, also states slightly dizzy when standing today, feels like she has a head cold.  No treatment tried  The history is provided by the patient. No language interpreter was used.    Past Medical History:  Diagnosis Date  . Anemia   . Bladder infection 03/2017  . Diabetes mellitus without complication ([place])    WITH 2019 PREGNANCY ONLY  . GERD (gastroesophageal reflux disease)    diet controlled - no meds  . Headache    last one 05/25/17 - otc med  . History of blood transfusion 2012   At [facility]  . History of DVT of lower extremity   . History of hiatal hernia   . MRSA infection    not in EPIC - per patient in 2011  . Pulmonary embolism ([place])   . S/P IVC filter   . SVD (spontaneous vaginal delivery)    x 3  . Thrombocytosis     Patient Active Problem List   Diagnosis Date Noted  . Metallic taste 50/53/9767  . Allergies 01/14/2020  . Abnormal chest x-ray 12/28/2019  . History of COVID-19 11/25/2019  . Type 2 diabetes mellitus without complication, without long-term current use of insulin ([place]) 11/25/2019  . History of recurrent deep vein thrombosis (DVT) 11/25/2019  . COVID-19 virus infection 11/10/2019  . Colitis 07/27/2017  . Missed abortion 05/29/2017  . Dysplasia of cervix, low grade (CIN 1) 03/28/2017  . Diabetes type 2, 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 ([place]) 03/28/2017  . Essential hypertension, benign 03/20/2017  . 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 BMI 50.0-59.9, adult ([place]) 02/28/2017  . Iron deficiency anemia 11/14/2011    Past Surgical History:  Procedure Laterality Date  . DILATION AND EVACUATION N/A  05/29/2017   Procedure: DILATATION AND EVACUATION;  Surgeon: Lavonia Drafts, MD;  Location: [facility];  Service: Gynecology;  Laterality: N/A;  . WISDOM TOOTH EXTRACTION      OB History    Gravida  5   Para  3   Term  3   Preterm  0   AB  2   Living  3     SAB  2   IAB  0   Ectopic  0   Multiple      Live Births  3        Obstetric Comments  Largest prior was 7lbs 7oz. No issues with deliveries.          Home Medications    Prior to Admission medications   Medication Sig Start Date End Date Taking? Authorizing Provider  ACCU-CHEK FASTCLIX LANCETS MISC 1 Device by Percutaneous route 4 (four) times daily. 03/28/17   Aletha Halim, MD  acetaminophen (TYLENOL) 500 MG tablet Take 1,000 mg by mouth every 8 (eight) hours as needed for moderate pain.    [provider]  blood glucose meter kit and supplies Dispense based on patient and insurance preference. Use up to four times daily as directed. (FOR ICD-10 E10.9, E11.9). 11/12/19   Cato Mulligan, MD  Blood Glucose Monitoring Suppl (ACCU-CHEK GUIDE) w/Device KIT 1 Device by Does not apply route  4 (four) times daily. 04/10/17   Donnamae Jude, MD  cetirizine (ZYRTEC ALLERGY) 10 MG tablet Take 1 tablet (10 mg total) by mouth daily. 15/17/61   Aleayah Chico, Jeanett Schlein, NP  famotidine (PEPCID) 20 MG tablet Take 1 tablet (20 mg total) by mouth daily. 01/05/20   Tedd Sias, PA  glucose blood (ACCU-CHEK GUIDE) test strip Use as instructed QID 12/10/19   Vevelyn Francois, NP  insulin detemir (LEVEMIR) 100 UNIT/ML FlexPen Inject 20 Units into the skin daily. 12/10/19   Vevelyn Francois, NP  Insulin Pen Needle 29G X 60.7PX MISC 1 application by Does not apply route in the morning and at bedtime. 12/10/19 12/09/20  Vevelyn Francois, NP  lisinopril (ZESTRIL) 10 MG tablet Take 1 tablet (10 mg total) by mouth daily. 12/10/19   Vevelyn Francois, NP  rivaroxaban (XARELTO) 20 MG TABS tablet Take 1 tablet (20 mg total) by mouth  daily with supper. 12/10/19   Vevelyn Francois, NP  Flossie Buffy INSULIN SYRINGE 30G X 1/2" 0.3 ML MISC See admin instructions. with insulin 11/12/19   [provider]    Family History Family History  Problem Relation Age of Onset  . Diabetes Mother   . Hypertension Mother   . Hypertension Maternal Uncle   . Diabetes Maternal Uncle   . Hypertension Maternal Grandmother     Social History Social History   Tobacco Use  . Smoking status: Never Smoker  . Smokeless tobacco: Never Used  . Tobacco comment: never used tobacco  Vaping Use  . Vaping Use: Never used  Substance Use Topics  . Alcohol use: No    Alcohol/week: 0.0 standard drinks  . Drug use: No     Allergies   Patient has no known allergies.   Review of Systems Review of Systems  Constitutional: Positive for fever. Negative for chills.  HENT: Positive for congestion, sinus pressure and sinus pain. Negative for ear pain, rhinorrhea and sore throat.        Metal taste in mouth  Eyes: Negative.   Respiratory: Negative for cough.   Gastrointestinal: Negative for nausea and vomiting.  Endocrine: Negative.   Genitourinary: Negative for dysuria.  Musculoskeletal: Negative for myalgias.  Skin: Negative for rash.  Allergic/Immunologic: Negative.   Neurological: Negative for headaches.  Hematological: Negative.   Psychiatric/Behavioral: Negative.   All other systems reviewed and are negative.    Physical Exam Triage Vital Signs ED Triage Vitals  Enc Vitals Group     BP 01/14/20 0833 125/68     Pulse Rate 01/14/20 0833 81     Resp 01/14/20 0833 20     Temp 01/14/20 0833 97.7 F (36.5 C)     Temp src --      SpO2 01/14/20 0833 100 %     Weight --      Height --      Head Circumference --      Peak Flow --      Pain Score 01/14/20 0831 0     Pain Loc --      Pain Edu? --      Excl. in [place]? --    No data found.  Updated Vital Signs BP 125/68 (BP Location: Left Arm)   Pulse 81   Temp 97.7 F  (36.5 C)   Resp 20   LMP 12/05/2019   SpO2 100%   Visual Acuity Right Eye Distance:   Left Eye Distance:   Bilateral Distance:  Right Eye Near:   Left Eye Near:    Bilateral Near:     Physical Exam Vitals and nursing note reviewed.  Constitutional:      General: She is active. She is not in acute distress.    Appearance: Normal appearance. She is well-developed and well-nourished. She is obese.  HENT:     Head: Normocephalic.     Right Ear: Tympanic membrane is retracted.     Left Ear: Tympanic membrane is retracted.     Nose: Congestion present.     Mouth/Throat:     Mouth: Oropharynx is clear and moist. Mucous membranes are moist.     Pharynx: Oropharynx is clear.  Eyes:     General: Lids are normal.     Extraocular Movements: EOM normal.     Conjunctiva/sclera: Conjunctivae normal.     Pupils: Pupils are equal, round, and reactive to light.  Neck:     Trachea: No tracheal deviation.  Cardiovascular:     Rate and Rhythm: Regular rhythm.     Pulses: Normal pulses.     Heart sounds: Normal heart sounds. No murmur heard.   Pulmonary:     Effort: Pulmonary effort is normal.     Breath sounds: Normal breath sounds.  Abdominal:     General: Bowel sounds are normal.     Palpations: Abdomen is soft.     Tenderness: There is no abdominal tenderness.  Musculoskeletal:        General: Normal range of motion.     Cervical back: Normal range of motion.  Lymphadenopathy:     Cervical: No cervical adenopathy.  Skin:    General: Skin is warm and dry.     Findings: No rash.  Neurological:     Mental Status: She is alert and oriented to person, place, and time.     GCS: GCS eye subscore is 4. GCS verbal subscore is 5. GCS motor subscore is 6.  Psychiatric:        Mood and Affect: Mood and affect normal.        Speech: Speech normal.        Behavior: Behavior normal. Behavior is cooperative.      UC Treatments / Results  Labs (all labs ordered are listed, but  only abnormal results are displayed) Labs Reviewed - No data to display  EKG   Radiology No results found.  Procedures Procedures (including critical care time)  Medications Ordered in UC Medications - No data to display  Initial Impression / Assessment and Plan / UC Course  I have reviewed the triage vital signs and the nursing notes.  Pertinent labs & imaging results that were available during my care of the patient were reviewed by me and considered in my medical decision making (see chart for details).    DDX: Vertigo, ulcer, viral illness, allergies  Final Clinical Impressions(s) / UC Diagnoses   Final diagnoses:  Allergy, initial encounter  Metallic taste     Discharge Instructions     Avoid spicy,greasy,salty, acidic foods. Follow up with PCP.     ED Prescriptions    Medication Sig Dispense Auth. Provider   cetirizine (ZYRTEC ALLERGY) 10 MG tablet Take 1 tablet (10 mg total) by mouth daily. 14 tablet Burnetta Kohls, Jeanett Schlein, NP     PDMP not reviewed this encounter.   Tori Milks, NP 16/10/96 1210

## 2020-01-14 NOTE — Discharge Instructions (Signed)
Avoid spicy,greasy,salty, acidic foods. Follow up with PCP.

## 2020-01-14 NOTE — ED Triage Notes (Signed)
Patient reports odd soapy taste in her mouth.  Onset Sunday.  Off balance feeling noticed today, noticed only with walking

## 2020-01-22 ENCOUNTER — Other Ambulatory Visit: Payer: Self-pay

## 2020-01-22 ENCOUNTER — Emergency Department (HOSPITAL_COMMUNITY)
Admission: EM | Admit: 2020-01-22 | Discharge: 2020-01-22 | Disposition: A | Discharge status: Home / Self Care | Payer: Medicaid Other | Attending: Emergency Medicine | Admitting: Emergency Medicine

## 2020-01-22 ENCOUNTER — Emergency Department (HOSPITAL_COMMUNITY): Payer: Medicaid Other

## 2020-01-22 DIAGNOSIS — R079 Chest pain, unspecified: Secondary | ICD-10-CM

## 2020-01-22 DIAGNOSIS — Z8616 Personal history of COVID-19: Secondary | ICD-10-CM | POA: Insufficient documentation

## 2020-01-22 DIAGNOSIS — E119 Type 2 diabetes mellitus without complications: Secondary | ICD-10-CM | POA: Diagnosis not present

## 2020-01-22 DIAGNOSIS — Z794 Long term (current) use of insulin: Secondary | ICD-10-CM | POA: Diagnosis not present

## 2020-01-22 DIAGNOSIS — R0789 Other chest pain: Secondary | ICD-10-CM | POA: Diagnosis not present

## 2020-01-22 DIAGNOSIS — Z7901 Long term (current) use of anticoagulants: Secondary | ICD-10-CM | POA: Diagnosis not present

## 2020-01-22 LAB — D-DIMER, QUANTITATIVE: D-Dimer, Quant: <0.27 ug/mL-FEU (ref 0.00–0.50)

## 2020-01-22 LAB — CBC
HCT: 37.4 % (ref 36.0–46.0)
Hemoglobin: 10.9 g/dL — ABNORMAL LOW (ref 12.0–15.0)
MCH: 21.6 pg — ABNORMAL LOW (ref 26.0–34.0)
MCHC: 29.1 g/dL — ABNORMAL LOW (ref 30.0–36.0)
MCV: 74.2 fL — ABNORMAL LOW (ref 80.0–100.0)
Platelets: 434 10*3/uL — ABNORMAL HIGH (ref 150–400)
RBC: 5.04 MIL/uL (ref 3.87–5.11)
RDW: 17.9 % — ABNORMAL HIGH (ref 11.5–15.5)
WBC: 11.1 10*3/uL — ABNORMAL HIGH (ref 4.0–10.5)
nRBC: 0 % (ref 0.0–0.2)

## 2020-01-22 LAB — I-STAT BETA HCG BLOOD, ED (MC, WL, AP ONLY): I-stat hCG, quantitative: <5 m[IU]/mL (ref <5)

## 2020-01-22 LAB — BASIC METABOLIC PANEL
Anion gap: 11 (ref 5–15)
BUN: 8 mg/dL (ref 6–20)
CO2: 24 mmol/L (ref 22–32)
Calcium: 9.3 mg/dL (ref 8.9–10.3)
Chloride: 100 mmol/L (ref 98–111)
Creatinine, Ser: 0.62 mg/dL (ref 0.44–1.00)
GFR, Estimated: >60 mL/min (ref >60)
Glucose, Bld: 139 mg/dL — ABNORMAL HIGH (ref 70–99)
Potassium: 3.6 mmol/L (ref 3.5–5.1)
Sodium: 135 mmol/L (ref 135–145)

## 2020-01-22 LAB — TROPONIN I (HIGH SENSITIVITY)
Troponin I (High Sensitivity): 5 ng/L (ref <18)
Troponin I (High Sensitivity): 5 ng/L (ref <18)

## 2020-01-22 MED ORDER — IBUPROFEN 800 MG PO TABS
800.0000 mg | ORAL_TABLET | Freq: Once | ORAL | Status: DC
  Filled 2020-01-22: qty 1

## 2020-01-22 NOTE — ED Notes (Signed)
Patient transported to X-ray 

## 2020-01-22 NOTE — ED Triage Notes (Signed)
Pt presents to ED POV. Pt c/oR CP that radiates towards side. Pt states that she woke  Up w/ pain 2d ago. Pt states she tried OTC meds w/ no relief.

## 2020-01-22 NOTE — ED Provider Notes (Signed)
TIME SEEN: 4:25 AM  CHIEF COMPLAINT: Chest pain  HPI: Patient is a 34 year old female with history of PE and DVT on Xarelto who presents to the emergency department with 2 days of right-sided chest pain.  Describes it as a sharp pain and a pressure.  States it feels better when she takes a deep breath.  No shortness of breath, nausea, vomiting, diaphoresis or dizziness.  No fever or cough.  No calf tenderness or swelling.  She did have COVID-19 a few months ago.  She is not vaccinated.  Dates this feels different than when she had a PE.  Reports compliance with her anticoagulation.  ROS: See HPI Constitutional: no fever  Eyes: no drainage  ENT: no runny nose   Cardiovascular:   chest pain  Resp: no SOB  GI: no vomiting GU: no dysuria Integumentary: no rash  Allergy: no hives  Musculoskeletal: no leg swelling  Neurological: no slurred speech ROS otherwise negative  PAST MEDICAL HISTORY/PAST SURGICAL HISTORY:  Past Medical History:  Diagnosis Date  . Anemia   . Bladder infection 03/2017  . Diabetes mellitus without complication ([place])    WITH 2019 PREGNANCY ONLY  . GERD (gastroesophageal reflux disease)    diet controlled - no meds  . Headache    last one 05/25/17 - otc med  . History of blood transfusion 2012   At [facility]  . History of DVT of lower extremity   . History of hiatal hernia   . MRSA infection    not in EPIC - per patient in 2011  . Pulmonary embolism ([place])   . S/P IVC filter   . SVD (spontaneous vaginal delivery)    x 3  . Thrombocytosis     MEDICATIONS:  Prior to Admission medications   Medication Sig Start Date End Date Taking? Authorizing Provider  ACCU-CHEK FASTCLIX LANCETS MISC 1 Device by Percutaneous route 4 (four) times daily. 03/28/17  Yes Aletha Halim, MD  acetaminophen (TYLENOL) 500 MG tablet Take 1,000 mg by mouth every 8 (eight) hours as needed for moderate pain.   Yes [provider]  blood glucose meter kit and supplies Dispense based  on patient and insurance preference. Use up to four times daily as directed. (FOR ICD-10 E10.9, E11.9). 11/12/19  Yes Cato Mulligan, MD  Blood Glucose Monitoring Suppl (ACCU-CHEK GUIDE) w/Device KIT 1 Device by Does not apply route 4 (four) times daily. 04/10/17  Yes Donnamae Jude, MD  cetirizine (ZYRTEC ALLERGY) 10 MG tablet Take 1 tablet (10 mg total) by mouth daily. Patient taking differently: Take 10 mg by mouth daily as needed for allergies. 53/66/44  Yes Defelice, Jeanett Schlein, NP  glucose blood (ACCU-CHEK GUIDE) test strip Use as instructed QID 12/10/19  Yes King, Diona Foley, NP  insulin detemir (LEVEMIR) 100 UNIT/ML FlexPen Inject 20 Units into the skin daily. 12/10/19  Yes King, Diona Foley, NP  Insulin Pen Needle 29G X 03.4VQ MISC 1 application by Does not apply route in the morning and at bedtime. 12/10/19 12/09/20 Yes King, Diona Foley, NP  lisinopril (ZESTRIL) 10 MG tablet Take 1 tablet (10 mg total) by mouth daily. 12/10/19  Yes Vevelyn Francois, NP  rivaroxaban (XARELTO) 20 MG TABS tablet Take 1 tablet (20 mg total) by mouth daily with supper. 12/10/19  Yes Vevelyn Francois, NP  Flossie Buffy INSULIN SYRINGE 30G X 1/2" 0.3 ML MISC See admin instructions. with insulin 11/12/19  Yes [provider]  famotidine (PEPCID) 20 MG tablet Take 1 tablet (  20 mg total) by mouth daily. Patient not taking: No sig reported 01/05/20   Tedd Sias, PA    ALLERGIES:  No Known Allergies  SOCIAL HISTORY:  Social History   Tobacco Use  . Smoking status: Never Smoker  . Smokeless tobacco: Never Used  . Tobacco comment: never used tobacco  Substance Use Topics  . Alcohol use: No    Alcohol/week: 0.0 standard drinks    FAMILY HISTORY: Family History  Problem Relation Age of Onset  . Diabetes Mother   . Hypertension Mother   . Hypertension Maternal Uncle   . Diabetes Maternal Uncle   . Hypertension Maternal Grandmother     EXAM: BP (!) 136/98 (BP Location: Right Arm)   Pulse 78   Temp  (!) 97.3 F (36.3 C) (Oral)   Resp 18   SpO2 99%  CONSTITUTIONAL: Alert and oriented and responds appropriately to questions. Well-appearing; well-nourished HEAD: Normocephalic EYES: Conjunctivae clear, pupils appear equal, EOM appear intact ENT: normal nose; moist mucous membranes NECK: Supple, normal ROM CARD: RRR; S1 and S2 appreciated; no murmurs, no clicks, no rubs, no gallops RESP: Normal chest excursion without splinting or tachypnea; breath sounds clear and equal bilaterally; no wheezes, no rhonchi, no rales, no hypoxia or respiratory distress, speaking full sentences ABD/GI: Normal bowel sounds; non-distended; soft, non-tender, no rebound, no guarding, no peritoneal signs, no hepatosplenomegaly BACK:  The back appears normal EXT: Normal ROM in all joints; no deformity noted, no edema; no cyanosis, no calf tenderness or calf swelling SKIN: Normal color for age and race; warm; no rash on exposed skin NEURO: Moves all extremities equally PSYCH: The patient's mood and manner are appropriate.   MEDICAL DECISION MAKING: Patient here with right-sided chest pain.  Doubt ACS, dissection.  She has had 2 normal high-sensitivity troponins in triage.  Her chest x-ray is clear and her EKG is nonischemic.  Will obtain D-dimer given her history of PE and DVT although she feels this does not feel like her previous PE.  Will give ibuprofen for pain.  Doubt dissection.  No sign of volume overload.  No infectious symptoms.  ED PROGRESS: Patient's D-dimer is negative.  I feel she is safe to be discharged home.  Recommended alternating Tylenol and Motrin.  At this time, I do not feel there is any life-threatening condition present. I have reviewed, interpreted and discussed all results (EKG, imaging, lab, urine as appropriate) and exam findings with patient/family. I have reviewed nursing notes and appropriate previous records.  I feel the patient is safe to be discharged home without further emergent  workup and can continue workup as an outpatient as needed. Discussed usual and customary return precautions. Patient/family verbalize understanding and are comfortable with this plan.  Outpatient follow-up has been provided as needed. All questions have been answered.     EKG Interpretation  Date/Time:  Friday January 22 2020 00:22:58 EST Ventricular Rate:  84 PR Interval:  152 QRS Duration: 72 QT Interval:  392 QTC Calculation: 463 R Axis:   1 Text Interpretation: Normal sinus rhythm Nonspecific T wave abnormality Prolonged QT Abnormal ECG Confirmed by Pryor Curia [phone]) on 01/22/2020 4:25:58 AM          [name] was evaluated in Emergency Department on 01/22/2020 for the symptoms described in the history of present illness. She was evaluated in the context of the global COVID-19 pandemic, which necessitated consideration that the patient might be at risk for infection with the SARS-CoV-2 virus that causes  COVID-19. Institutional protocols and algorithms that pertain to the evaluation of patients at risk for COVID-19 are in a state of rapid change based on information released by regulatory bodies including the CDC and federal and state organizations. These policies and algorithms were followed during the patient's care in the ED.      Riddik Senna, Delice Bison, DO 01/22/20 [phone]

## 2020-01-22 NOTE — Discharge Instructions (Signed)
Your cardiac labs, EKG, chest x-ray and D-dimer today were normal.  Please follow-up with your primary care physician if symptoms continue.  Please continue your Xarelto as prescribed.  You may alternate Tylenol 1000 mg every 6 hours as needed for pain, fever and Ibuprofen 800 mg every 8 hours as needed for pain, fever.  Please take Ibuprofen with food.  Do not take more than 4000 mg of Tylenol (acetaminophen) in a 24 hour period.

## 2020-02-02 ENCOUNTER — Encounter (HOSPITAL_COMMUNITY): Payer: Self-pay | Admitting: Emergency Medicine

## 2020-02-02 ENCOUNTER — Ambulatory Visit (HOSPITAL_COMMUNITY)
Admission: EM | Admit: 2020-02-02 | Discharge: 2020-02-02 | Disposition: A | Discharge status: Home / Self Care | Payer: Medicaid Other | Attending: Student | Admitting: Student

## 2020-02-02 ENCOUNTER — Other Ambulatory Visit: Payer: Self-pay

## 2020-02-02 DIAGNOSIS — Z20822 Contact with and (suspected) exposure to covid-19: Secondary | ICD-10-CM | POA: Insufficient documentation

## 2020-02-02 DIAGNOSIS — J069 Acute upper respiratory infection, unspecified: Secondary | ICD-10-CM | POA: Diagnosis not present

## 2020-02-02 DIAGNOSIS — R059 Cough, unspecified: Secondary | ICD-10-CM

## 2020-02-02 LAB — RESP PANEL BY RT-PCR (FLU A&B, COVID) ARPGX2
Influenza A by PCR: NEGATIVE
Influenza B by PCR: NEGATIVE
SARS Coronavirus 2 by RT PCR: NEGATIVE

## 2020-02-02 LAB — POCT RAPID STREP A, ED / UC: Streptococcus, Group A Screen (Direct): NEGATIVE

## 2020-02-02 MED ORDER — LIDOCAINE VISCOUS HCL 2 % MT SOLN: 15.0000 mL | OROMUCOSAL | 0 refills | Status: DC | PRN

## 2020-02-02 NOTE — ED Provider Notes (Addendum)
[place]    CSN: [id number] Arrival date & time: 02/02/20  2505      History   Chief Complaint Chief Complaint  Patient presents with  . Cough    HPI Virginia Fitzgerald is a 35 y.o. female Presenting for URI symptoms for 1 day following exposure to family member with strep and flu. History of anemia, UTI, GERD, headache, DVT LE, PE s/p IVC filter, MRSA infection. Endorses cough and sore throat. Chest wall pain with coughing but no chest pain at rest, no pain down arms/jaw. Denies fevers/chills, n/v/d, shortness of breath, chest pain,  facial pain, teeth pain, headaches,loss of taste/smell, swollen lymph nodes, ear pain.  Denies chest pain, shortness of breath, confusion, high fevers.  Not vaccinated for covid-19.   HPI  Past Medical History:  Diagnosis Date  . Anemia   . Bladder infection 03/2017  . Diabetes mellitus without complication ([place])    WITH 2019 PREGNANCY ONLY  . GERD (gastroesophageal reflux disease)    diet controlled - no meds  . Headache    last one 05/25/17 - otc med  . History of blood transfusion 2012   At [facility]  . History of DVT of lower extremity   . History of hiatal hernia   . MRSA infection    not in EPIC - per patient in 2011  . Pulmonary embolism ([place])   . S/P IVC filter   . SVD (spontaneous vaginal delivery)    x 3  . Thrombocytosis     Patient Active Problem List   Diagnosis Date Noted  . Metallic taste 39/76/7341  . Allergies 01/14/2020  . Abnormal chest x-ray 12/28/2019  . History of COVID-19 11/25/2019  . Type 2 diabetes mellitus without complication, without long-term current use of insulin ([place]) 11/25/2019  . History of recurrent deep vein thrombosis (DVT) 11/25/2019  . COVID-19 virus infection 11/10/2019  . Colitis 07/27/2017  . Missed abortion 05/29/2017  . Dysplasia of cervix, low grade (CIN 1) 03/28/2017  . Diabetes type 2, 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 ([place]) 03/28/2017  . Essential hypertension, benign 03/20/2017  .  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 BMI 50.0-59.9, adult ([place]) 02/28/2017  . Iron deficiency anemia 11/14/2011    Past Surgical History:  Procedure Laterality Date  . DILATION AND EVACUATION N/A 05/29/2017   Procedure: DILATATION AND EVACUATION;  Surgeon: Lavonia Drafts, MD;  Location: [facility];  Service: Gynecology;  Laterality: N/A;  . WISDOM TOOTH EXTRACTION      OB History    Gravida  5   Para  3   Term  3   Preterm  0   AB  2   Living  3     SAB  2   IAB  0   Ectopic  0   Multiple      Live Births  3        Obstetric Comments  Largest prior was 7lbs 7oz. No issues with deliveries.          Home Medications    Prior to Admission medications   Medication Sig Start Date End Date Taking? Authorizing Provider  famotidine (PEPCID) 20 MG tablet Take 1 tablet (20 mg total) by mouth daily. 01/05/20  Yes Fondaw, Wylder S, PA  insulin detemir (LEVEMIR) 100 UNIT/ML FlexPen Inject 20 Units into the skin daily. 12/10/19  Yes King, Diona Foley, NP  lidocaine (XYLOCAINE) 2 % solution Use as directed 15 mLs in the mouth or throat as needed for  mouth pain. 02/02/20  Yes Hazel Sams, PA-C  lisinopril (ZESTRIL) 10 MG tablet Take 1 tablet (10 mg total) by mouth daily. 12/10/19  Yes Vevelyn Francois, NP  rivaroxaban (XARELTO) 20 MG TABS tablet Take 1 tablet (20 mg total) by mouth daily with supper. 12/10/19  Yes King, Diona Foley, NP  ACCU-CHEK FASTCLIX LANCETS MISC 1 Device by Percutaneous route 4 (four) times daily. 03/28/17   Aletha Halim, MD  acetaminophen (TYLENOL) 500 MG tablet Take 1,000 mg by mouth every 8 (eight) hours as needed for moderate pain.    [provider]  blood glucose meter kit and supplies Dispense based on patient and insurance preference. Use up to four times daily as directed. (FOR ICD-10 E10.9, E11.9). 11/12/19   Cato Mulligan, MD  Blood Glucose Monitoring Suppl (ACCU-CHEK GUIDE) w/Device KIT 1 Device by Does not apply  route 4 (four) times daily. 04/10/17   Donnamae Jude, MD  cetirizine (ZYRTEC ALLERGY) 10 MG tablet Take 1 tablet (10 mg total) by mouth daily. Patient taking differently: Take 10 mg by mouth daily as needed for allergies. 94/76/54   Defelice, Jeanett Schlein, NP  glucose blood (ACCU-CHEK GUIDE) test strip Use as instructed QID 12/10/19   Vevelyn Francois, NP  Insulin Pen Needle 29G X 65.0PT MISC 1 application by Does not apply route in the morning and at bedtime. 12/10/19 12/09/20  Vevelyn Francois, NP  Flossie Buffy INSULIN SYRINGE 30G X 1/2" 0.3 ML MISC See admin instructions. with insulin 11/12/19   [provider]    Family History Family History  Problem Relation Age of Onset  . Diabetes Mother   . Hypertension Mother   . Hypertension Maternal Uncle   . Diabetes Maternal Uncle   . Hypertension Maternal Grandmother     Social History Social History   Tobacco Use  . Smoking status: Never Smoker  . Smokeless tobacco: Never Used  . Tobacco comment: never used tobacco  Vaping Use  . Vaping Use: Never used  Substance Use Topics  . Alcohol use: No    Alcohol/week: 0.0 standard drinks  . Drug use: No     Allergies   Patient has no known allergies.   Review of Systems Review of Systems  Constitutional: Negative for appetite change, chills and fever.  HENT: Positive for sore throat. Negative for congestion, ear pain, rhinorrhea, sinus pressure and sinus pain.   Eyes: Negative for redness and visual disturbance.  Respiratory: Positive for cough. Negative for chest tightness, shortness of breath and wheezing.   Cardiovascular: Negative for chest pain and palpitations.  Gastrointestinal: Negative for abdominal pain, constipation, diarrhea, nausea and vomiting.  Genitourinary: Negative for dysuria, frequency and urgency.  Musculoskeletal: Negative for myalgias.  Neurological: Negative for dizziness, weakness and headaches.  Psychiatric/Behavioral: Negative for confusion.  All  other systems reviewed and are negative.    Physical Exam Triage Vital Signs ED Triage Vitals [02/02/20 0926]  Enc Vitals Group     BP      Pulse      Resp      Temp      Temp src      SpO2      Weight      Height      Head Circumference      Peak Flow      Pain Score 7     Pain Loc      Pain Edu?      Excl. in [place]?  No data found.  Updated Vital Signs BP (!) 135/95 (BP Location: Left Arm) Comment (BP Location): large cuff  Pulse 97   Temp 97.7 F (36.5 C) (Oral)   Resp 20   LMP 01/06/2020   SpO2 100%   Visual Acuity Right Eye Distance:   Left Eye Distance:   Bilateral Distance:    Right Eye Near:   Left Eye Near:    Bilateral Near:     Physical Exam Vitals reviewed.  Constitutional:      General: She is not in acute distress.    Appearance: Normal appearance. She is not ill-appearing.  HENT:     Head: Normocephalic and atraumatic.     Right Ear: Hearing, tympanic membrane, ear canal and external ear normal. No swelling or tenderness. There is no impacted cerumen. No mastoid tenderness. Tympanic membrane is not perforated, erythematous, retracted or bulging.     Left Ear: Hearing, tympanic membrane, ear canal and external ear normal. No swelling or tenderness. There is no impacted cerumen. No mastoid tenderness. Tympanic membrane is not perforated, erythematous, retracted or bulging.     Nose:     Right Sinus: No maxillary sinus tenderness or frontal sinus tenderness.     Left Sinus: No maxillary sinus tenderness or frontal sinus tenderness.     Mouth/Throat:     Mouth: Mucous membranes are moist.     Pharynx: Uvula midline. Posterior oropharyngeal erythema present. No oropharyngeal exudate.     Tonsils: No tonsillar exudate.  Cardiovascular:     Rate and Rhythm: Normal rate and regular rhythm.     Heart sounds: Normal heart sounds.     Comments: Calves are bilaterally without edema, pain, erythema. Negative homan sign. Pulmonary:     Breath sounds:  Normal breath sounds and air entry. No wheezing, rhonchi or rales.  Chest:     Chest wall: No tenderness.     Comments: Reproducible chest wall pain along  Inferior sternal border Abdominal:     General: Abdomen is flat. Bowel sounds are normal.     Tenderness: There is no abdominal tenderness. There is no guarding or rebound.  Musculoskeletal:     Right lower leg: No edema.     Left lower leg: No edema.  Lymphadenopathy:     Cervical: No cervical adenopathy.  Neurological:     General: No focal deficit present.     Mental Status: She is alert and oriented to Fitzgerald, place, and time.  Psychiatric:        Attention and Perception: Attention and perception normal.        Mood and Affect: Mood and affect normal.        Behavior: Behavior normal. Behavior is cooperative.        Thought Content: Thought content normal.        Judgment: Judgment normal.      UC Treatments / Results  Labs (all labs ordered are listed, but only abnormal results are displayed) Labs Reviewed  CULTURE, GROUP A STREP ([place])  RESP PANEL BY RT-PCR (FLU A&B, COVID) [id number]  POCT RAPID STREP A, ED / UC    EKG   Radiology No results found.  Procedures Procedures (including critical care time)  Medications Ordered in UC Medications - No data to display  Initial Impression / Assessment and Plan / UC Course  I have reviewed the triage vital signs and the nursing notes.  Pertinent labs & imaging results that were available during my care of the  patient were reviewed by me and considered in my medical decision making (see chart for details).     Pt with history of DVT/PE 2012, on Xarelto for years without issue. Denies calf pain/swelling today.  afebrile nontachycardic nontachypneic, oxygenating well on room air. Stressed importance of ambulating every 2-3 hours for prevention of blood clots.   Covid and influenza tests sent today. Patient is not vaccinated for covid-19. Isolation precautions per CDC  guidelines until negative result. Symptomatic relief with OTC Mucinex, Nyquil, etc. Return precautions- new/worsening fevers/chills, shortness of breath, chest pain, abd pain, etc.   Rapid strep negative. Culture sent. Lidocaine mouthwash as below.   Final Clinical Impressions(s) / UC Diagnoses   Final diagnoses:  Viral URI with cough     Discharge Instructions     -For sore throat, use lidocaine mouthwash up to every 4 hours. Make sure not to eat for at least 1 hour after using this, as your mouth will be very numb and you could bite yourself. -For fevers/chills, body aches, headaches- use Tylenol and Ibuprofen. You can alternate these for maximum effect. Use up to 3063m Tylenol daily and 32064mIbuprofen daily. Make sure to take ibuprofen with food. Check the bottle of ibuprofen/tylenol for specific dosage instructions. -We'll call you if your strep culture, covid test, or flu test is positive. This typically takes 1-2 days.  We are currently awaiting result of your PCR covid-19 test. Please isolate at home while awaiting these results. If your test is positive for Covid-19, continue to isolate at home for 5 days if you have mild symptoms, or a total of 10 days from symptom onset if you have more severe symptoms. If you quarantine for a shorter period of time (i.e. 5 days), make sure to wear a mask until day 10 of symptoms. Treat your symptoms at home with OTC remedies like tylenol/ibuprofen, mucinex, nyquil, etc. Seek medical attention if you develop high fevers, chest pain, shortness of breath, ear pain, facial pain, etc. Make sure to get up and move around every 2-3 hours while convalescing to help prevent blood clots. Drink plenty of fluids, and rest as much as possible.     ED Prescriptions    Medication Sig Dispense Auth. Provider   lidocaine (XYLOCAINE) 2 % solution Use as directed 15 mLs in the mouth or throat as needed for mouth pain. 100 mL GrHazel SamsPA-C     PDMP not  reviewed this encounter.   GrHazel SamsPA-C 02/02/20 1008    GrHazel SamsPA-C 02/02/20 1016

## 2020-02-02 NOTE — ED Triage Notes (Addendum)
cough and sore throat started last night.  Chest hurts with coughing Family member tested positive for strep and flu on Sunday 01/31/2020

## 2020-02-02 NOTE — Discharge Instructions (Addendum)
-  For sore throat, use lidocaine mouthwash up to every 4 hours. Make sure not to eat for at least 1 hour after using this, as your mouth will be very numb and you could bite yourself. -For fevers/chills, body aches, headaches- use Tylenol and Ibuprofen. You can alternate these for maximum effect. Use up to 3000mg  Tylenol daily and 3200mg  Ibuprofen daily. Make sure to take ibuprofen with food. Check the bottle of ibuprofen/tylenol for specific dosage instructions. -We'll call you if your strep culture, covid test, or flu test is positive. This typically takes 1-2 days.  We are currently awaiting result of your PCR covid-19 test. Please isolate at home while awaiting these results. If your test is positive for Covid-19, continue to isolate at home for 5 days if you have mild symptoms, or a total of 10 days from symptom onset if you have more severe symptoms. If you quarantine for a shorter period of time (i.e. 5 days), make sure to wear a mask until day 10 of symptoms. Treat your symptoms at home with OTC remedies like tylenol/ibuprofen, mucinex, nyquil, etc. Seek medical attention if you develop high fevers, chest pain, shortness of breath, ear pain, facial pain, etc. Make sure to get up and move around every 2-3 hours while convalescing to help prevent blood clots. Drink plenty of fluids, and rest as much as possible.

## 2020-02-04 LAB — CULTURE, GROUP A STREP (THRC)

## 2020-02-11 ENCOUNTER — Ambulatory Visit (HOSPITAL_COMMUNITY)
Admission: RE | Admit: 2020-02-11 | Discharge: 2020-02-11 | Disposition: A | Discharge status: Home / Self Care | Payer: Medicaid Other | Source: Ambulatory Visit | Attending: Nurse Practitioner | Admitting: Nurse Practitioner

## 2020-02-11 ENCOUNTER — Ambulatory Visit (INDEPENDENT_AMBULATORY_CARE_PROVIDER_SITE_OTHER): Payer: Medicaid Other | Admitting: Nurse Practitioner

## 2020-02-11 ENCOUNTER — Encounter: Payer: Self-pay | Admitting: Nurse Practitioner

## 2020-02-11 ENCOUNTER — Other Ambulatory Visit: Payer: Self-pay

## 2020-02-11 VITALS — BP 129/82 | HR 100 | Temp 97.7°F | Ht 62.0 in | Wt 286.0 lb

## 2020-02-11 DIAGNOSIS — R140 Abdominal distension (gaseous): Secondary | ICD-10-CM | POA: Diagnosis not present

## 2020-02-11 DIAGNOSIS — N926 Irregular menstruation, unspecified: Secondary | ICD-10-CM | POA: Diagnosis not present

## 2020-02-11 DIAGNOSIS — E119 Type 2 diabetes mellitus without complications: Secondary | ICD-10-CM

## 2020-02-11 DIAGNOSIS — K219 Gastro-esophageal reflux disease without esophagitis: Secondary | ICD-10-CM

## 2020-02-11 DIAGNOSIS — Z13220 Encounter for screening for lipoid disorders: Secondary | ICD-10-CM | POA: Diagnosis not present

## 2020-02-11 MED ORDER — FAMOTIDINE 20 MG PO TABS: 20.0000 mg | ORAL_TABLET | Freq: Every day | ORAL | 11 refills | Status: DC

## 2020-02-11 NOTE — Progress Notes (Signed)
[place], [place]  [address] Phone:  [phone]   Fax:  [phone]   Established Patient Office Visit  Subjective:  Patient ID: Virginia Fitzgerald, female    DOB: 17-Sep-1985  Age: 35 y.o. MRN: [id number]  CC:  Chief Complaint  Patient presents with  . Follow-up    2 follow up , having coughing , sob , taking otc night     HPI Virginia Fitzgerald presents for diabetes. She  has a past medical history of Anemia, Bladder infection (03/2017), Diabetes mellitus without complication ([place]), GERD (gastroesophageal reflux disease), Headache, History of blood transfusion (2012), History of DVT of lower extremity, History of hiatal hernia, MRSA infection, Pulmonary embolism ([place]), S/P IVC filter, SVD (spontaneous vaginal delivery), and Thrombocytosis.   Diabetes Mellitus Patient presents for follow up of diabetes. Current symptoms include: hyperglycemia, increase appetite and paresthesia of the feet. Symptoms have progressed to a point and plateaued. Patient denies foot ulcerations, hypoglycemia , increased appetite, nausea, polydipsia, polyuria, vomiting and weight loss. Evaluation to date has included: fasting blood sugar, fasting lipid panel and hemoglobin A1C.  Home sugars: BGs have been labile ranging between 120 and 200. Current treatment: Continued insulin which has been unable to assess effectiveness and Continued statin which has been effective. Last dilated eye exam: unknown  Past Medical History:  Diagnosis Date  . Anemia   . Bladder infection 03/2017  . Diabetes mellitus without complication ([place])    WITH 2019 PREGNANCY ONLY  . GERD (gastroesophageal reflux disease)    diet controlled - no meds  . Headache    last one 05/25/17 - otc med  . History of blood transfusion 2012   At [facility]  . History of DVT of lower extremity   . History of hiatal hernia   . MRSA infection    not in EPIC - per patient in 2011  . Pulmonary embolism ([place])    . S/P IVC filter   . SVD (spontaneous vaginal delivery)    x 3  . Thrombocytosis     Past Surgical History:  Procedure Laterality Date  . DILATION AND EVACUATION N/A 05/29/2017   Procedure: DILATATION AND EVACUATION;  Surgeon: Lavonia Drafts, MD;  Location: [facility];  Service: Gynecology;  Laterality: N/A;  . WISDOM TOOTH EXTRACTION      Family History  Problem Relation Age of Onset  . Diabetes Mother   . Hypertension Mother   . Hypertension Maternal Uncle   . Diabetes Maternal Uncle   . Hypertension Maternal Grandmother     Social History   Socioeconomic History  . Marital status: Single    Spouse name: Not on file  . Number of children: Not on file  . Years of education: Not on file  . Highest education level: Not on file  Occupational History  . Not on file  Tobacco Use  . Smoking status: Never Smoker  . Smokeless tobacco: Never Used  . Tobacco comment: never used tobacco  Vaping Use  . Vaping Use: Never used  Substance and Sexual Activity  . Alcohol use: No    Alcohol/week: 0.0 standard drinks  . Drug use: No  . Sexual activity: Not Currently    Birth control/protection: None  Other Topics Concern  . Not on file  Social History Narrative   ** Merged History Encounter **       Social Determinants of Health   Financial Resource Strain: Not on file  Food Insecurity:  Not on file  Transportation Needs: Not on file  Physical Activity: Not on file  Stress: Not on file  Social Connections: Not on file  Intimate Partner Violence: Not on file    Outpatient Medications Prior to Visit  Medication Sig Dispense Refill  . ACCU-CHEK FASTCLIX LANCETS MISC 1 Device by Percutaneous route 4 (four) times daily. 100 each 12  . acetaminophen (TYLENOL) 500 MG tablet Take 1,000 mg by mouth every 8 (eight) hours as needed for moderate pain.    . blood glucose meter kit and supplies Dispense based on patient and insurance preference. Use up to four times daily as  directed. (FOR ICD-10 E10.9, E11.9). 1 each 0  . Blood Glucose Monitoring Suppl (ACCU-CHEK GUIDE) w/Device KIT 1 Device by Does not apply route 4 (four) times daily. 1 kit 0  . cetirizine (ZYRTEC ALLERGY) 10 MG tablet Take 1 tablet (10 mg total) by mouth daily. (Patient taking differently: Take 10 mg by mouth daily as needed for allergies.) 14 tablet 0  . glucose blood (ACCU-CHEK GUIDE) test strip Use as instructed QID 100 each 12  . insulin detemir (LEVEMIR) 100 UNIT/ML FlexPen Inject 20 Units into the skin daily. 15 mL 11  . Insulin Pen Needle 29G X 24.2AS MISC 1 application by Does not apply route in the morning and at bedtime. 100 each 11  . lidocaine (XYLOCAINE) 2 % solution Use as directed 15 mLs in the mouth or throat as needed for mouth pain. 100 mL 0  . lisinopril (ZESTRIL) 10 MG tablet Take 1 tablet (10 mg total) by mouth daily. 90 tablet 3  . rivaroxaban (XARELTO) 20 MG TABS tablet Take 1 tablet (20 mg total) by mouth daily with supper. 30 tablet 0  . ULTICARE INSULIN SYRINGE 30G X 1/2" 0.3 ML MISC See admin instructions. with insulin    . famotidine (PEPCID) 20 MG tablet Take 1 tablet (20 mg total) by mouth daily. 14 tablet 0  . COVID-19 Specimen Collection KIT TEST AS DIRECTED TODAY     No facility-administered medications prior to visit.    No Known Allergies  ROS Review of Systems    Objective:    Physical Exam Constitutional:      General: She is not in acute distress.    Appearance: She is obese. She is not ill-appearing, toxic-appearing or diaphoretic.  HENT:     Head: Normocephalic and atraumatic.     Nose: Nose normal.     Mouth/Throat:     Mouth: Mucous membranes are moist.  Cardiovascular:     Rate and Rhythm: Normal rate and regular rhythm.     Pulses: Normal pulses.     Heart sounds: Normal heart sounds.  Pulmonary:     Effort: Pulmonary effort is normal.     Breath sounds: Normal breath sounds.  Abdominal:     Palpations: Abdomen is soft. There is no  mass.     Tenderness: There is no abdominal tenderness. There is no rebound.     Hernia: No hernia is present.     Comments: Increases abdominal girth  Musculoskeletal:     Cervical back: Normal range of motion.  Skin:    General: Skin is warm and dry.     Capillary Refill: Capillary refill takes less than 2 seconds.  Neurological:     General: No focal deficit present.     Mental Status: She is alert and oriented to person, place, and time.  Psychiatric:  Mood and Affect: Mood normal.        Behavior: Behavior normal.        Thought Content: Thought content normal.        Judgment: Judgment normal.     BP 129/82 (BP Location: Right Arm, Patient Position: Sitting, Cuff Size: Normal)   Pulse 100   Temp 97.7 F (36.5 C) (Temporal)   Ht '5\' 2"'  (1.575 m)   Wt 286 lb (129.7 kg)   SpO2 98%   BMI 52.31 kg/m  Wt Readings from Last 3 Encounters:  02/11/20 286 lb (129.7 kg)  01/05/20 285 lb 15 oz (129.7 kg)  12/28/19 285 lb 0.1 oz (129.3 kg)     Health Maintenance Due  Topic Date Due  . PAP SMEAR-Modifier  03/20/2020    There are no preventive care reminders to display for this patient.  Lab Results  Component Value Date   TSH 2.260 03/20/2017   Lab Results  Component Value Date   WBC 11.1 (H) 01/22/2020   HGB 10.9 (L) 01/22/2020   HCT 37.4 01/22/2020   MCV 74.2 (L) 01/22/2020   PLT 434 (H) 01/22/2020   Lab Results  Component Value Date   NA 135 01/22/2020   K 3.6 01/22/2020   CO2 24 01/22/2020   GLUCOSE 139 (H) 01/22/2020   BUN 8 01/22/2020   CREATININE 0.62 01/22/2020   BILITOT 0.5 12/25/2019   ALKPHOS 85 12/25/2019   AST 24 12/25/2019   ALT 35 12/25/2019   PROT 7.7 12/25/2019   ALBUMIN 3.7 12/25/2019   CALCIUM 9.3 01/22/2020   ANIONGAP 11 01/22/2020   Lab Results  Component Value Date   CHOL  04/14/2009    173        ATP III CLASSIFICATION:  <200     mg/dL   Desirable  200-239  mg/dL   Borderline High  >=240    mg/dL   High          Lab  Results  Component Value Date   HDL 30 (L) 04/14/2009   Lab Results  Component Value Date   LDLCALC  04/14/2009    97        Total Cholesterol/HDL:CHD Risk Coronary Heart Disease Risk Table                     Men   Women  1/2 Average Risk   3.4   3.3  Average Risk       5.0   4.4  2 X Average Risk   9.6   7.1  3 X Average Risk  23.4   11.0        Use the calculated Patient Ratio above and the CHD Risk Table to determine the patient's CHD Risk.        ATP III CLASSIFICATION (LDL):  <100     mg/dL   Optimal  100-129  mg/dL   Near or Above                    Optimal  130-159  mg/dL   Borderline  160-189  mg/dL   High  >190     mg/dL   Very High   Lab Results  Component Value Date   TRIG 196 (H) 11/10/2019   Lab Results  Component Value Date   CHOLHDL 5.8 04/14/2009   Lab Results  Component Value Date   HGBA1C 10.8 (A) 12/10/2019   HGBA1C 10.8 12/10/2019   HGBA1C  10.8 (A) 12/10/2019   HGBA1C 10.8 (A) 12/10/2019      Assessment & Plan:   Problem List Items Addressed This Visit      Endocrine   Type 2 diabetes mellitus without complication, without long-term current use of insulin ([place]) - Primary Stable CBG per pt is <200 with previous adjustment. Will continue with current regimen and follow up in 2 months for repeat a1c.  Will add-on statin at next visit as patient continues with efficiency tolerance and compliance with current regimen   Relevant Orders   Comp. Metabolic Panel (12)   POCT URINALYSIS DIP ([facility])    Other Visit Diagnoses    Screening for cholesterol level       Relevant Orders   Lipid panel   Irregular menstrual cycle       Relevant Orders   POCT urine pregnancy   Abdominal distension       Relevant Orders   DG Abd 2 Views (Completed)      Meds ordered this encounter  Medications  . DISCONTD: famotidine (PEPCID) 20 MG tablet    Sig: Take 1 tablet (20 mg total) by mouth daily.    Dispense:  30 tablet    Refill:  11    Order  Specific Question:   Supervising Provider    Answer:   Tresa Garter [id number]  . famotidine (PEPCID) 20 MG tablet    Sig: Take 1 tablet (20 mg total) by mouth daily.    Dispense:  30 tablet    Refill:  11    Order Specific Question:   Supervising Provider    Answer:   Tresa Garter [[id number]]    Follow-up: Return in about 6 weeks (around 03/24/2020) for follow up [id number].    Vevelyn Francois, NP

## 2020-02-12 LAB — COMP. METABOLIC PANEL (12)
AST: 26 IU/L (ref 0–40)
Albumin/Globulin Ratio: 1.3 (ref 1.2–2.2)
Albumin: 4 g/dL (ref 3.8–4.8)
Alkaline Phosphatase: 96 IU/L (ref 44–121)
BUN/Creatinine Ratio: 12 (ref 9–23)
BUN: 9 mg/dL (ref 6–20)
Bilirubin Total: <0.2 mg/dL (ref 0.0–1.2)
Calcium: 9.4 mg/dL (ref 8.7–10.2)
Chloride: 105 mmol/L (ref 96–106)
Creatinine, Ser: 0.74 mg/dL (ref 0.57–1.00)
GFR calc Af Amer: 122 mL/min/{1.73_m2} (ref >59)
GFR calc non Af Amer: 106 mL/min/{1.73_m2} (ref >59)
Globulin, Total: 3.2 g/dL (ref 1.5–4.5)
Glucose: 134 mg/dL — ABNORMAL HIGH (ref 65–99)
Potassium: 3.8 mmol/L (ref 3.5–5.2)
Sodium: 143 mmol/L (ref 134–144)
Total Protein: 7.2 g/dL (ref 6.0–8.5)

## 2020-02-12 LAB — LIPID PANEL
Chol/HDL Ratio: 6.3 ratio — ABNORMAL HIGH (ref 0.0–4.4)
Cholesterol, Total: 194 mg/dL (ref 100–199)
HDL: 31 mg/dL — ABNORMAL LOW (ref >39)
LDL Chol Calc (NIH): 125 mg/dL — ABNORMAL HIGH (ref 0–99)
Triglycerides: 211 mg/dL — ABNORMAL HIGH (ref 0–149)
VLDL Cholesterol Cal: 38 mg/dL (ref 5–40)

## 2020-03-18 ENCOUNTER — Other Ambulatory Visit: Payer: Self-pay

## 2020-03-18 MED ORDER — RIVAROXABAN 20 MG PO TABS: 20.0000 mg | ORAL_TABLET | Freq: Every day | ORAL | 0 refills | Status: DC

## 2020-03-21 ENCOUNTER — Telehealth: Payer: Self-pay | Admitting: Nurse Practitioner

## 2020-03-21 ENCOUNTER — Ambulatory Visit: Payer: Medicaid Other | Admitting: Nurse Practitioner

## 2020-03-21 NOTE — Telephone Encounter (Signed)
Pt was called to reschedule appointment that was a no show for Mar 21, 2020 VM was left

## 2020-05-25 ENCOUNTER — Other Ambulatory Visit: Payer: Self-pay

## 2020-05-25 MED ORDER — RIVAROXABAN 20 MG PO TABS: 20.0000 mg | ORAL_TABLET | Freq: Every day | ORAL | 0 refills | Status: DC

## 2020-09-22 ENCOUNTER — Ambulatory Visit (INDEPENDENT_AMBULATORY_CARE_PROVIDER_SITE_OTHER): Payer: Medicaid Other

## 2020-09-22 ENCOUNTER — Other Ambulatory Visit: Payer: Self-pay

## 2020-09-22 DIAGNOSIS — Z3201 Encounter for pregnancy test, result positive: Secondary | ICD-10-CM

## 2020-09-22 LAB — POCT PREGNANCY, URINE: Preg Test, Ur: POSITIVE — AB

## 2020-09-22 NOTE — Progress Notes (Signed)
Chart reviewed for nurse visit. Agree with plan of care.   Lezlie Lye, NP 09/22/2020 2:17 PM

## 2020-09-22 NOTE — Progress Notes (Signed)
Pt dropped off urine today for UPT. UPT is positive. Pt states took 1 home UPT that was positive yesterday.   Pt denies any vaginal bleeding, abd pain or cramps at this time. Pt advised to start taking PNV. Pt scheduled for New OB intake and appt at [facility] given pt is established. Pt agreeable to date and times of appt. Pt verbalized understanding and agreeable to plan of care.   LMP: 08/11/2020 EDC: 05/18/21 [redacted]w[redacted]d  SColletta Maryland RN

## 2020-09-26 ENCOUNTER — Inpatient Hospital Stay (HOSPITAL_COMMUNITY)
Admission: AD | Admit: 2020-09-26 | Discharge: 2020-09-27 | Disposition: A | Discharge status: Home / Self Care | Payer: Medicaid Other | Attending: Obstetrics and Gynecology | Admitting: Obstetrics and Gynecology

## 2020-09-26 ENCOUNTER — Other Ambulatory Visit: Payer: Self-pay

## 2020-09-26 DIAGNOSIS — Z794 Long term (current) use of insulin: Secondary | ICD-10-CM | POA: Insufficient documentation

## 2020-09-26 DIAGNOSIS — R12 Heartburn: Secondary | ICD-10-CM | POA: Insufficient documentation

## 2020-09-26 DIAGNOSIS — Z79899 Other long term (current) drug therapy: Secondary | ICD-10-CM | POA: Insufficient documentation

## 2020-09-26 DIAGNOSIS — O26891 Other specified pregnancy related conditions, first trimester: Secondary | ICD-10-CM

## 2020-09-26 DIAGNOSIS — M549 Dorsalgia, unspecified: Secondary | ICD-10-CM | POA: Insufficient documentation

## 2020-09-26 DIAGNOSIS — Z8249 Family history of ischemic heart disease and other diseases of the circulatory system: Secondary | ICD-10-CM | POA: Insufficient documentation

## 2020-09-26 DIAGNOSIS — Z7901 Long term (current) use of anticoagulants: Secondary | ICD-10-CM | POA: Insufficient documentation

## 2020-09-26 DIAGNOSIS — O99891 Other specified diseases and conditions complicating pregnancy: Secondary | ICD-10-CM | POA: Insufficient documentation

## 2020-09-26 DIAGNOSIS — R42 Dizziness and giddiness: Secondary | ICD-10-CM | POA: Insufficient documentation

## 2020-09-26 DIAGNOSIS — O3680X0 Pregnancy with inconclusive fetal viability, not applicable or unspecified: Secondary | ICD-10-CM | POA: Insufficient documentation

## 2020-09-26 DIAGNOSIS — Z3A01 Less than 8 weeks gestation of pregnancy: Secondary | ICD-10-CM

## 2020-09-26 HISTORY — DX: Gestational diabetes mellitus in pregnancy, unspecified control: O24.419

## 2020-09-26 HISTORY — DX: Fatty (change of) liver, not elsewhere classified: K76.0

## 2020-09-27 ENCOUNTER — Telehealth: Payer: Self-pay

## 2020-09-27 ENCOUNTER — Inpatient Hospital Stay (HOSPITAL_COMMUNITY): Payer: Medicaid Other

## 2020-09-27 ENCOUNTER — Encounter (HOSPITAL_COMMUNITY): Payer: Self-pay | Admitting: Obstetrics and Gynecology

## 2020-09-27 ENCOUNTER — Other Ambulatory Visit: Payer: Self-pay

## 2020-09-27 ENCOUNTER — Other Ambulatory Visit: Payer: Self-pay | Admitting: Obstetrics and Gynecology

## 2020-09-27 DIAGNOSIS — R12 Heartburn: Secondary | ICD-10-CM

## 2020-09-27 DIAGNOSIS — O26891 Other specified pregnancy related conditions, first trimester: Secondary | ICD-10-CM

## 2020-09-27 DIAGNOSIS — Z3A01 Less than 8 weeks gestation of pregnancy: Secondary | ICD-10-CM | POA: Diagnosis not present

## 2020-09-27 DIAGNOSIS — Z8249 Family history of ischemic heart disease and other diseases of the circulatory system: Secondary | ICD-10-CM | POA: Diagnosis not present

## 2020-09-27 DIAGNOSIS — O3680X0 Pregnancy with inconclusive fetal viability, not applicable or unspecified: Secondary | ICD-10-CM | POA: Diagnosis not present

## 2020-09-27 DIAGNOSIS — O99891 Other specified diseases and conditions complicating pregnancy: Secondary | ICD-10-CM | POA: Diagnosis not present

## 2020-09-27 DIAGNOSIS — R42 Dizziness and giddiness: Secondary | ICD-10-CM | POA: Diagnosis not present

## 2020-09-27 DIAGNOSIS — Z7901 Long term (current) use of anticoagulants: Secondary | ICD-10-CM | POA: Diagnosis not present

## 2020-09-27 DIAGNOSIS — Z79899 Other long term (current) drug therapy: Secondary | ICD-10-CM | POA: Diagnosis not present

## 2020-09-27 DIAGNOSIS — M549 Dorsalgia, unspecified: Secondary | ICD-10-CM | POA: Diagnosis not present

## 2020-09-27 DIAGNOSIS — Z794 Long term (current) use of insulin: Secondary | ICD-10-CM | POA: Diagnosis not present

## 2020-09-27 LAB — URINALYSIS, ROUTINE W REFLEX MICROSCOPIC
Bilirubin Urine: NEGATIVE
Glucose, UA: NEGATIVE mg/dL
Hgb urine dipstick: NEGATIVE
Ketones, ur: 5 mg/dL — AB
Nitrite: NEGATIVE
Protein, ur: 30 mg/dL — AB
Specific Gravity, Urine: 1.028 (ref 1.005–1.030)
pH: 5 (ref 5.0–8.0)

## 2020-09-27 LAB — GLUCOSE, CAPILLARY: Glucose-Capillary: 188 mg/dL — ABNORMAL HIGH (ref 70–99)

## 2020-09-27 MED ORDER — ENOXAPARIN SODIUM 60 MG/0.6ML IJ SOSY: 60.0000 mg | PREFILLED_SYRINGE | INTRAMUSCULAR | 7 refills | Status: DC

## 2020-09-27 MED ORDER — CALCIUM CARBONATE ANTACID 500 MG PO CHEW
2.0000 | CHEWABLE_TABLET | Freq: Once | ORAL | Status: AC
  Administered 2020-09-27: 400 mg via ORAL
  Filled 2020-09-27: qty 2

## 2020-09-27 NOTE — Discharge Instructions (Signed)

## 2020-09-27 NOTE — MAU Note (Signed)
Pt reports she has been feeling light-headed x 2 weeks. Also reports a burning sensation in her mid upper back x 2 days, off/on. Denies dysuria. Denies bleeding.

## 2020-09-27 NOTE — Progress Notes (Signed)
OB Note In d/w pharmacy, will do lovenox 60 mg qday for prophylaxis; syringes come in 60 or 80 dosed vial. This was sent in RNs in clinic messaged to call her to let her know about this.   Durene Romans MD Attending Center for [employer] (Faculty Practice) 09/27/2020 Time: [phone]

## 2020-09-27 NOTE — Telephone Encounter (Signed)
Message received from [place], MD requesting I call patient with new medication instructions. Called pt; partner answers phone and states pt will be at work until 11 PM. Pt is unable to take cell phone inside of work. Explained I am calling about new medication and pt may return my call or check MyChart. MyChart message sent.

## 2020-09-27 NOTE — MAU Provider Note (Signed)
Chief Complaint:  Back Pain and Dizziness   None    HPI: Virginia Fitzgerald is a 35 y.o. [id number] at 79w1dwho presents to maternity admissions reporting mid-sternal burning that radiates into her throat and upper back. Has a history of GERD and is prescribed pepcid but does not take it because she is not sure what she can take in pregnancy. Also complains of light-headedness, usually when she's been standing for too long or gets overheated. Has a history of DM2 and is supposed to be on insulin, but does not take it. Has not seen her endocrinologist since January. Last ate at 11:30pm. Has a history of blood clots, is on Xarelto, last taken yesterday morning (09/26/20). No other physical complaints, thinks she is about [redacted]wks pregnant.   [facility]: [facility], initial OB appt on 10/27/20  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 03/28/2017   A1C 6.5    Fatty liver    GDM (gestational diabetes mellitus)    WITH 2019 PREGNANCY ONLY   GERD (gastroesophageal reflux disease)    diet controlled - no meds   Headache    last one 05/25/17 - otc med   [facility]  History of DVT of lower extremity    History of hiatal hernia    MRSA infection    not in EPIC - per patient in 2011   Pulmonary embolism (HCC)    S/P IVC filter    Thrombocytosis    OB History  Gravida Para Term Preterm AB Living  '6 3 3 ' 0 2 3  SAB IAB Ectopic Multiple Live Births  2 0 0   3    # Outcome Date GA Lbr Len/2nd Weight Sex Delivery Anes PTL Lv  6 Current           5 SAB 05/29/17 [redacted]w[redacted]d       4 Term 03/20/09    M Vag-Spont   LIV  3 Term 04/07/08    M Vag-Spont   LIV  2 Term 06/28/02    F Vag-Spont   LIV  1 SAB             Obstetric Comments  Largest prior was 7lbs 7oz. No issues with deliveries.    Past Surgical History:  Procedure Laterality Date   DILATION  AND EVACUATION N/A 05/29/2017   Procedure: DILATATION AND EVACUATION;  Surgeon: HaLavonia DraftsMD;  [facility];  Service: Gynecology;  Laterality: N/A;   WISDOM TOOTH EXTRACTION     Family History  Problem Relation Age of Onset   Diabetes Mother    Hypertension Mother    Hypertension Maternal Uncle    Diabetes Maternal Uncle    Hypertension Maternal Grandmother    Social History   Tobacco Use   Smoking status: Never   Smokeless tobacco: Never   Tobacco comments:    never used tobacco  Vaping Use   Vaping Use: Never used  Substance Use Topics   Alcohol use: No    Alcohol/week: 0.0 standard drinks   Drug use: No   No Known Allergies No medications prior to admission.    I have reviewed patient's Past Medical Hx, Surgical Hx, Family Hx, Social Hx, medications and allergies.   ROS:  Pertinent items noted in HPI and remainder of comprehensive  ROS otherwise negative.  Physical Exam  Patient Vitals for the past 24 hrs:  BP Temp Temp src Pulse Resp SpO2 Height Weight  09/27/20 0257 (!) 144/92 -- -- 97 16 98 % -- --  09/27/20 0043 (!) 148/81 98.5 F (36.9 C) Oral (!) 101 17 98 % '5\' 3"'  (1.6 m) 287 lb (130.2 kg)   Constitutional: Well-developed, obese female in no acute distress.  Cardiovascular: normal rate & rhythm, no murmur Respiratory: normal effort, lung sounds clear throughout GI: Abd soft, pos BS x 4 MS: Extremities nontender, no edema, normal ROM Neurologic: Alert and oriented x 4.  GU: no CVA tenderness Pelvic: exam deferred  Labs: Results for orders placed or performed during the hospital encounter of 09/26/20 (from the past 24 hour(s))  Urinalysis, Routine w reflex microscopic Urine, Clean Catch     Status: Abnormal   Collection Time: 09/27/20 12:57 AM  Result Value Ref Range   Color, Urine YELLOW YELLOW   APPearance HAZY (A) CLEAR   Specific Gravity, Urine 1.028 1.005 - 1.030   pH 5.0 5.0 - 8.0   Glucose, UA NEGATIVE NEGATIVE mg/dL   Hgb  urine dipstick NEGATIVE NEGATIVE   Bilirubin Urine NEGATIVE NEGATIVE   Ketones, ur 5 (A) NEGATIVE mg/dL   Protein, ur 30 (A) NEGATIVE mg/dL   Nitrite NEGATIVE NEGATIVE   Leukocytes,Ua SMALL (A) NEGATIVE   RBC / HPF 0-5 0 - 5 RBC/hpf   WBC, UA 6-10 0 - 5 WBC/hpf   Bacteria, UA RARE (A) NONE SEEN   Squamous Epithelial / LPF 11-20 0 - 5   Mucus PRESENT   Glucose, capillary     Status: Abnormal   Collection Time: 09/27/20  1:07 AM  Result Value Ref Range   Glucose-Capillary 188 (H) 70 - 99 mg/dL   Imaging:  US OB LESS THAN 14 WEEKS WITH OB TRANSVAGINAL  Result Date: 09/27/2020 CLINICAL DATA:  Pregnant, evaluate for viability EXAM: OBSTETRIC <14 WK [name] AND TRANSVAGINAL OB US TECHNIQUE: Both transabdominal and transvaginal ultrasound examinations were performed for complete evaluation of the gestation as well as the maternal uterus, adnexal regions, and pelvic cul-de-sac. Transvaginal technique was performed to assess early pregnancy. COMPARISON:  None. FINDINGS: Intrauterine gestational sac: Single Yolk sac:  Visualized. Embryo:  Visualized. Cardiac Activity: Visualized. Heart Rate: 130 bpm CRL:  7.5 mm   6 w   4 d                  [name] EDC: 05/19/2021 Subchorionic hemorrhage:  Small subchorionic hemorrhage. Maternal uterus/adnexae: 4.0 x 3.6 x 3.4 cm right fundal fibroid. Left ovary is within normal limits. Right ovary is notable for a 3.8 x 3.3 x 3.4 cm corpus luteum. No free fluid. IMPRESSION: Single IUP with cardiac activity, measuring 6 weeks 4 days by crown-rump length, as above. Electronically Signed   By: Julian Hy M.D.   On: 09/27/2020 02:07    MAU Course: Orders Placed This Encounter  Procedures   US OB LESS THAN 14 WEEKS WITH OB TRANSVAGINAL   Urinalysis, Routine w reflex microscopic Urine, Clean Catch   Glucose, capillary   Discharge patient   Meds ordered this encounter  Medications   calcium carbonate (TUMS - dosed in mg elemental calcium) chewable tablet 400 mg of  elemental calcium   MDM: Initially planning to medically screen patient and send home with safe med list as her chief complaint is not emergent and can be treated at home. But upon discussion where she  divulged she has not taken any insulin since December, I ordered a CBG (188) and called Dr. Ilda Basset to consult. He requested a viability scan on the patient. U/S showed IUP at [redacted]w[redacted]d Tums given with good relief of burning sensation.   Per Dr. PIlda Basset he will call patient later this morning with appropriate lovenox dose, should hold xarelto. Pt verbalized understanding. Will send message to office to have virtual visit switched to a new OB with MD visit.   Assessment: 1. Less than [redacted] weeks gestation of pregnancy   2. Pregnancy with uncertain fetal viability   3. Heartburn during pregnancy in first trimester    Plan: Discharge home in stable condition with return precautions.  See AVS for education provided.   [facility] Go to.   Specialty: Obstetrics and Gynecology Why: The office will call to get your new OB visit moved up to as soon as possible, will likely change your 02/06/20 to an in-person visit.   One of the MDs will contact you later today to discuss your lovenox dose. Contact information: [address] [place] [phone]               Allergies as of 09/27/2020   No Known Allergies      Medication List     STOP taking these medications    lidocaine 2 % solution Commonly known as: XYLOCAINE   lisinopril 10 MG tablet Commonly known as: ZESTRIL   rivaroxaban 20 MG Tabs tablet Commonly known as: XARELTO   Robafen DM Cgh/Chest Congest 10-100 MG/5ML liquid Generic drug: dextromethorphan-guaiFENesin       TAKE these medications    Accu-Chek FastClix Lancets Misc 1 Device by Percutaneous route 4 (four) times daily.   Accu-Chek Softclix Lancets  lancets USE AS DIRECTED FOUR TIMES DAILY   Accu-Chek Guide test strip Generic drug: glucose blood USE AS DIRECTED FOUR TIMES DAILY   Accu-Chek Guide test strip Generic drug: glucose blood Use as instructed QID   Accu-Chek Guide w/Device Kit 1 Device by Does not apply route 4 (four) times daily.   Accu-Chek Guide w/Device Kit USE AS DIRECTED   acetaminophen 500 MG tablet Commonly known as: TYLENOL Take 1,000 mg by mouth every 8 (eight) hours as needed for moderate pain.   blood glucose meter kit and supplies Dispense based on patient and insurance preference. Use up to four times daily as directed. (FOR ICD-10 E10.9, E11.9).   cetirizine 10 MG tablet Commonly known as: ZyrTEC Allergy Take 1 tablet (10 mg total) by mouth daily. What changed:  when to take this reasons to take this   COVID-19 Specimen Collection Kit TEST AS DIRECTED TODAY   famotidine 20 MG tablet Commonly known as: PEPCID Take 1 tablet (20 mg total) by mouth daily.   insulin detemir 100 UNIT/ML FlexPen Commonly known as: LEVEMIR Inject 20 Units into the skin daily.   Insulin Pen Needle 29G X 178.2UMMisc 1 application by Does not apply route in the morning and at bedtime.   UltiCare Insulin Syringe 30G X 1/2" 0.3 ML Misc Generic drug: Insulin Syringe-Needle U-100 See admin instructions. with insulin   UltiCare Insulin Syringe 30G X 1/2" 0.3 ML Misc Generic drug: Insulin Syringe-Needle U-100 USE AS DIRECTED WITH INSULIN       JGaylan Gerold CNM, MSN, IBCLC Certified Nurse Midwife, [place]

## 2020-09-28 ENCOUNTER — Other Ambulatory Visit (HOSPITAL_COMMUNITY): Payer: Self-pay

## 2020-10-06 ENCOUNTER — Encounter: Payer: Medicaid Other | Admitting: Obstetrics and Gynecology

## 2020-10-06 ENCOUNTER — Telehealth: Payer: Medicaid Other

## 2020-10-07 ENCOUNTER — Other Ambulatory Visit: Payer: Self-pay

## 2020-10-07 ENCOUNTER — Inpatient Hospital Stay (HOSPITAL_COMMUNITY)
Admission: AD | Admit: 2020-10-07 | Discharge: 2020-10-07 | Disposition: A | Discharge status: Home / Self Care | Payer: Medicaid Other | Attending: Obstetrics and Gynecology | Admitting: Obstetrics and Gynecology

## 2020-10-07 ENCOUNTER — Encounter (HOSPITAL_COMMUNITY): Payer: Self-pay | Admitting: Obstetrics and Gynecology

## 2020-10-07 DIAGNOSIS — K5900 Constipation, unspecified: Secondary | ICD-10-CM | POA: Diagnosis not present

## 2020-10-07 DIAGNOSIS — Z86718 Personal history of other venous thrombosis and embolism: Secondary | ICD-10-CM | POA: Insufficient documentation

## 2020-10-07 DIAGNOSIS — O26611 Liver and biliary tract disorders in pregnancy, first trimester: Secondary | ICD-10-CM | POA: Insufficient documentation

## 2020-10-07 DIAGNOSIS — R141 Gas pain: Secondary | ICD-10-CM

## 2020-10-07 DIAGNOSIS — O26899 Other specified pregnancy related conditions, unspecified trimester: Secondary | ICD-10-CM

## 2020-10-07 DIAGNOSIS — O24111 Pre-existing diabetes mellitus, type 2, in pregnancy, first trimester: Secondary | ICD-10-CM | POA: Diagnosis not present

## 2020-10-07 DIAGNOSIS — K219 Gastro-esophageal reflux disease without esophagitis: Secondary | ICD-10-CM | POA: Insufficient documentation

## 2020-10-07 DIAGNOSIS — R109 Unspecified abdominal pain: Secondary | ICD-10-CM | POA: Diagnosis not present

## 2020-10-07 DIAGNOSIS — O26891 Other specified pregnancy related conditions, first trimester: Secondary | ICD-10-CM | POA: Diagnosis not present

## 2020-10-07 DIAGNOSIS — Z3A08 8 weeks gestation of pregnancy: Secondary | ICD-10-CM

## 2020-10-07 DIAGNOSIS — K760 Fatty (change of) liver, not elsewhere classified: Secondary | ICD-10-CM | POA: Insufficient documentation

## 2020-10-07 DIAGNOSIS — Z7901 Long term (current) use of anticoagulants: Secondary | ICD-10-CM | POA: Diagnosis not present

## 2020-10-07 DIAGNOSIS — O99611 Diseases of the digestive system complicating pregnancy, first trimester: Secondary | ICD-10-CM | POA: Insufficient documentation

## 2020-10-07 DIAGNOSIS — Z86711 Personal history of pulmonary embolism: Secondary | ICD-10-CM | POA: Diagnosis not present

## 2020-10-07 DIAGNOSIS — O09521 Supervision of elderly multigravida, first trimester: Secondary | ICD-10-CM | POA: Insufficient documentation

## 2020-10-07 DIAGNOSIS — Z794 Long term (current) use of insulin: Secondary | ICD-10-CM | POA: Insufficient documentation

## 2020-10-07 LAB — URINALYSIS, ROUTINE W REFLEX MICROSCOPIC
Bilirubin Urine: NEGATIVE
Glucose, UA: NEGATIVE mg/dL
Hgb urine dipstick: NEGATIVE
Ketones, ur: NEGATIVE mg/dL
Nitrite: NEGATIVE
Protein, ur: NEGATIVE mg/dL
Specific Gravity, Urine: 1.015 (ref 1.005–1.030)
pH: 7 (ref 5.0–8.0)

## 2020-10-07 LAB — URINALYSIS, MICROSCOPIC (REFLEX)

## 2020-10-07 LAB — WET PREP, GENITAL
Clue Cells Wet Prep HPF POC: NONE SEEN
Sperm: NONE SEEN
Trich, Wet Prep: NONE SEEN
Yeast Wet Prep HPF POC: NONE SEEN

## 2020-10-07 MED ORDER — POLYETHYLENE GLYCOL 3350 17 G PO PACK: 17.0000 g | PACK | Freq: Every day | ORAL | 0 refills | Status: DC

## 2020-10-07 MED ORDER — SIMETHICONE 80 MG PO CHEW: 80.0000 mg | CHEWABLE_TABLET | Freq: Four times a day (QID) | ORAL | 0 refills | Status: DC | PRN

## 2020-10-07 MED ORDER — ACETAMINOPHEN 325 MG PO TABS
650.0000 mg | ORAL_TABLET | Freq: Once | ORAL | Status: AC
  Administered 2020-10-07: 650 mg via ORAL
  Filled 2020-10-07: qty 2

## 2020-10-07 NOTE — MAU Note (Signed)
Pt instructed in obtaining vaginal swabs which she got without difficulty

## 2020-10-07 NOTE — MAU Note (Signed)
About 0440 started having lower abd cramping. Was unable to get back to sleep due to the cramping pain. Denies VB.

## 2020-10-07 NOTE — MAU Provider Note (Signed)
Chief Complaint: No chief complaint on file.   Event Date/Time   First Provider Initiated Contact with Patient 10/07/20 [phone]        SUBJECTIVE HPI: Virginia Fitzgerald is a 35 y.o. [id number] at 53w4dby LMP who presents to maternity admissions reporting low to middle abdominal cramping. Sudden, colicky. Now almost gone. . She denies vaginal bleeding, vaginal itching/burning, urinary symptoms, h/a, dizziness, n/v, or fever/chills.    Abdominal Pain This is a new problem. The current episode started today. The onset quality is sudden. The problem occurs intermittently. The problem has been rapidly improving. The pain is located in the LLQ. Associated symptoms include constipation. Pertinent negatives include no diarrhea, dysuria, fever, frequency or headaches. Nothing aggravates the pain. The pain is relieved by Nothing. She has tried nothing for the symptoms.  RN Note: About 07341started having lower abd cramping. Was unable to get back to sleep due to the cramping pain. Denies VB.  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 03/28[place]2017   A1C 6.5    Fatty liver    GDM (gestational diabetes mellitus)    WITH 2019 PREGNANCY ONLY   GERD (gastroesophageal reflux disease)    diet controlled - no meds   Headache    last one 05/25/17 - otc med   [facility]  History of DVT of lower extremity    History of hiatal hernia    MRSA infection    not in EPIC - per patient in 2011   Pulmonary embolism (HCC)    S/P IVC filter    Thrombocytosis    Past Surgical History:  Procedure Laterality Date   DILATION AND EVACUATION N/A 05/29/2017   Procedure: DILATATION AND EVACUATION;  Surgeon: HLavonia Drafts MD;  [facility];  Service: Gynecology;  Laterality: N/A;   WISDOM TOOTH EXTRACTION     Social History   Socioeconomic History   Marital status: Single    Spouse name: Not on  file   Number of children: Not on file   Years of education: Not on file   Highest education level: Not on file  Occupational History   Not on file  Tobacco Use   Smoking status: Never   Smokeless tobacco: Never   Tobacco comments:    never used tobacco  Vaping Use   Vaping Use: Never used  Substance and Sexual Activity   Alcohol use: No    Alcohol/week: 0.0 standard drinks   Drug use: No   Sexual activity: Not Currently    Birth control/protection: None  Other Topics Concern   Not on file  Social History Narrative   ** Merged History Encounter **       Social Determinants of Health   Financial Resource Strain: Not on file  Food Insecurity: Not on file  Transportation Needs: Not on file  Physical Activity: Not on file  Stress: Not on file  Social Connections: Not on file  Intimate Partner Violence: Not on file   No current facility-administered medications on file prior to encounter.   Current Outpatient Medications on File Prior to Encounter  Medication Sig Dispense Refill   ACCU-CHEK FASTCLIX LANCETS MISC 1 Device by Percutaneous route 4 (four) times daily. 100 each 12   Accu-Chek Softclix Lancets lancets USE AS DIRECTED FOUR TIMES DAILY 100 each 0   acetaminophen (TYLENOL) 500 MG tablet  Take 1,000 mg by mouth every 8 (eight) hours as needed for moderate pain.     blood glucose meter kit and supplies Dispense based on patient and insurance preference. Use up to four times daily as directed. (FOR ICD-10 E10.9, E11.9). 1 each 0   Blood Glucose Monitoring Suppl (ACCU-CHEK GUIDE) w/Device KIT 1 Device by Does not apply route 4 (four) times daily. 1 kit 0   Blood Glucose Monitoring Suppl (ACCU-CHEK GUIDE) w/Device KIT USE AS DIRECTED 1 kit 0   cetirizine (ZYRTEC ALLERGY) 10 MG tablet Take 1 tablet (10 mg total) by mouth daily. (Patient taking differently: Take 10 mg by mouth daily as needed for allergies.) 14 tablet 0   COVID-19 Specimen Collection KIT TEST AS DIRECTED  TODAY     enoxaparin (LOVENOX) 60 MG/0.6ML injection Inject 0.6 mLs (60 mg total) into the skin daily. 30 mL 7   famotidine (PEPCID) 20 MG tablet Take 1 tablet (20 mg total) by mouth daily. 30 tablet 11   glucose blood (ACCU-CHEK GUIDE) test strip Use as instructed QID 100 each 12   glucose blood test strip USE AS DIRECTED FOUR TIMES DAILY 100 strip 0   insulin detemir (LEVEMIR) 100 UNIT/ML FlexPen Inject 20 Units into the skin daily. 15 mL 11   Insulin Pen Needle 29G X 83.1DV MISC 1 application by Does not apply route in the morning and at bedtime. 100 each 11   Insulin Syringe-Needle U-100 30G X 1/2" 0.3 ML MISC USE AS DIRECTED WITH INSULIN 60 each 0   ULTICARE INSULIN SYRINGE 30G X 1/2" 0.3 ML MISC See admin instructions. with insulin     No Known Allergies  I have reviewed patient's Past Medical Hx, Surgical Hx, Family Hx, Social Hx, medications and allergies.   ROS:  Review of Systems  Constitutional:  Negative for fever.  Gastrointestinal:  Positive for abdominal pain and constipation. Negative for diarrhea.  Genitourinary:  Negative for dysuria and frequency.  Neurological:  Negative for headaches.  Review of Systems  Other systems negative   Physical Exam  Physical Exam Patient Vitals for the past 24 hrs:  Temp Resp SpO2 Height Weight  10/07/20 0618 -- -- 99 % -- --  10/07/20 0613 97.8 F (36.6 C) 18 -- _0  (1.6 m) 131.5 kg   Constitutional: Well-developed, well-nourished female in no acute distress.  Cardiovascular: normal rate Respiratory: normal effort GI: Abd soft, non-tender. Pos BS x 4 MS: Extremities nontender, no edema, normal ROM Neurologic: Alert and oriented x 4.  GU: Neg CVAT.  PELVIC EXAM: deferred since pain is not in pelvis  LAB RESULTS Results for orders placed or performed during the hospital encounter of 10/07/20 (from the past 24 hour(s))  Wet prep, genital     Status: Abnormal   Collection Time: 10/07/20  6:30 AM   Specimen: PATH Cytology  Cervicovaginal Ancillary Only  Result Value Ref Range   Yeast Wet Prep HPF POC NONE SEEN NONE SEEN   Trich, Wet Prep NONE SEEN NONE SEEN   Clue Cells Wet Prep HPF POC NONE SEEN NONE SEEN   WBC, Wet Prep HPF POC FEW (A) NONE SEEN   Sperm NONE SEEN       IMAGING Pt informed that the ultrasound is considered a limited OB ultrasound and is not intended to be a complete ultrasound exam.  Patient also informed that the ultrasound is not being completed with the intent of assessing for fetal or placental anomalies or any pelvic abnormalities.  Explained that the purpose of today's ultrasound is to assess for presentation, BPP and amniotic fluid volume.  Patient acknowledges the purpose of the exam and the limitations of the study.   Bedside US done Difficult to visualize due to habitus Single fetus seen with HR 150  MAU Management/MDM: Discussed pain is likely gas pain related to constipation Will send Rx for simethicone and miralax Has followup appt this month   ASSESSMENT SIUP at 80w4dColicky abdominal pain Constipation No bleeding  PLAN Discharge home Will schedule followup TVUS due to high risk pregnancy, clotting issues and diabetes for viability and growth  Pt stable at time of discharge. Encouraged to return here if she develops worsening of symptoms, increase in pain, fever, or other concerning symptoms.    MHansel FeinsteinCNM, MSN Certified Nurse-Midwife 10/07/2020  6:53 AM

## 2020-10-10 LAB — GC/CHLAMYDIA PROBE AMP (~~LOC~~) NOT AT ARMC
Chlamydia: NEGATIVE
Comment: NEGATIVE
Comment: NORMAL
Neisseria Gonorrhea: NEGATIVE

## 2020-10-19 ENCOUNTER — Other Ambulatory Visit: Payer: Self-pay

## 2020-10-19 ENCOUNTER — Ambulatory Visit (INDEPENDENT_AMBULATORY_CARE_PROVIDER_SITE_OTHER): Payer: Medicaid Other | Admitting: Obstetrics and Gynecology

## 2020-10-19 ENCOUNTER — Other Ambulatory Visit (HOSPITAL_COMMUNITY)
Admission: RE | Admit: 2020-10-19 | Discharge: 2020-10-19 | Disposition: A | Discharge status: Home / Self Care | Payer: Medicaid Other | Source: Ambulatory Visit | Attending: Obstetrics and Gynecology | Admitting: Obstetrics and Gynecology

## 2020-10-19 ENCOUNTER — Ambulatory Visit
Admission: RE | Admit: 2020-10-19 | Discharge: 2020-10-19 | Disposition: A | Discharge status: Home / Self Care | Payer: Medicaid Other | Source: Ambulatory Visit | Attending: Advanced Practice Midwife | Admitting: Advanced Practice Midwife

## 2020-10-19 ENCOUNTER — Encounter: Payer: Self-pay | Admitting: Obstetrics and Gynecology

## 2020-10-19 VITALS — BP 157/96 | HR 115 | Wt 288.0 lb

## 2020-10-19 DIAGNOSIS — Z6841 Body Mass Index (BMI) 40.0 and over, adult: Secondary | ICD-10-CM

## 2020-10-19 DIAGNOSIS — R141 Gas pain: Secondary | ICD-10-CM | POA: Insufficient documentation

## 2020-10-19 DIAGNOSIS — O0990 Supervision of high risk pregnancy, unspecified, unspecified trimester: Secondary | ICD-10-CM | POA: Diagnosis present

## 2020-10-19 DIAGNOSIS — Z3A10 10 weeks gestation of pregnancy: Secondary | ICD-10-CM

## 2020-10-19 DIAGNOSIS — O26899 Other specified pregnancy related conditions, unspecified trimester: Secondary | ICD-10-CM | POA: Insufficient documentation

## 2020-10-19 DIAGNOSIS — O10919 Unspecified pre-existing hypertension complicating pregnancy, unspecified trimester: Secondary | ICD-10-CM

## 2020-10-19 DIAGNOSIS — R109 Unspecified abdominal pain: Secondary | ICD-10-CM | POA: Insufficient documentation

## 2020-10-19 DIAGNOSIS — O24119 Pre-existing diabetes mellitus, type 2, in pregnancy, unspecified trimester: Secondary | ICD-10-CM | POA: Insufficient documentation

## 2020-10-19 DIAGNOSIS — I2699 Other pulmonary embolism without acute cor pulmonale: Secondary | ICD-10-CM

## 2020-10-19 DIAGNOSIS — O24111 Pre-existing diabetes mellitus, type 2, in pregnancy, first trimester: Secondary | ICD-10-CM

## 2020-10-19 DIAGNOSIS — O99210 Obesity complicating pregnancy, unspecified trimester: Secondary | ICD-10-CM

## 2020-10-19 DIAGNOSIS — O09521 Supervision of elderly multigravida, first trimester: Secondary | ICD-10-CM | POA: Insufficient documentation

## 2020-10-19 DIAGNOSIS — Z7901 Long term (current) use of anticoagulants: Secondary | ICD-10-CM

## 2020-10-19 DIAGNOSIS — N870 Mild cervical dysplasia: Secondary | ICD-10-CM

## 2020-10-19 LAB — OB RESULTS CONSOLE GC/CHLAMYDIA: Gonorrhea: NEGATIVE

## 2020-10-19 MED ORDER — GOJJI WEIGHT SCALE MISC: 1.0000 | 0 refills | Status: DC | PRN

## 2020-10-19 MED ORDER — BLOOD PRESSURE KIT DEVI: 1.0000 | 0 refills | Status: DC | PRN

## 2020-10-19 MED ORDER — LABETALOL HCL 200 MG PO TABS: 200.0000 mg | ORAL_TABLET | Freq: Two times a day (BID) | ORAL | 3 refills | Status: DC

## 2020-10-19 NOTE — Progress Notes (Signed)
New OB Note  10/19/2020   Clinic: [facility]  Chief Complaint: new OB  Transfer of Care Patient: no  History of Present Illness: Virginia Fitzgerald is a 35 y.o. [id number] @ 10/2 weeks ([place] 4/17, based on Patient's last menstrual period was 08/08/2020.=10wk u/s).  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   Any events prior to today's visit: patient switched to lovenox from xarelto Her periods were: qmonth, regular She was using no method when she conceived.  She has Negative signs or symptoms of nausea/vomiting of pregnancy. On any medications around the time she conceived/early pregnancy:  xarelto  ROS: A 12-point review of systems was performed and negative, except as stated in the above HPI.  OBGYN History: As per HPI. OB History  Gravida Para Term Preterm AB Living  '5 3 3 ' 0 1 3  SAB IAB Ectopic Multiple Live Births  1 0 0   3    # Outcome Date GA Lbr Len/2nd Weight Sex Delivery Anes PTL Lv  5 Current           4 SAB 05/29/17 [redacted]w[redacted]d        3 Term 03/20/09    M Vag-Spont   LIV  2 Term 04/07/08    M Vag-Spont   LIV  1 Term 06/28/02    F Vag-Spont   LIV    Obstetric Comments  Largest prior was 7lbs 7oz. No issues with deliveries.     Any issues with any prior pregnancies:  cHTN, GDM, on anti-coagulation, h/o PP VTE Prior children are healthy, doing well, and without any problems or issues: yes History of pap smears: Yes. 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[place] 03/28/2017   A1C 6.5    Fatty liver    GDM (gestational diabetes mellitus)    WITH 2019 PREGNANCY ONLY   GERD (gastroesophageal reflux disease)    diet controlled - no meds   Headache    last one 05/25/17 - otc med   [facility] 2012   [facility]  History of DVT of lower extremity    History of hiatal hernia    MRSA infection    not in EPIC - per patient in 2011   Pulmonary embolism (HCC)    S/P IVC filter    Thrombocytosis     Past Surgical History: Past Surgical History:  Procedure Laterality Date   DILATION AND EVACUATION N/A 05/29/2017   Procedure: DILATATION AND EVACUATION;  Surgeon: HLavonia Drafts MD;  [facility];  Service: Gynecology;  Laterality: N/A;   WISDOM TOOTH EXTRACTION      Family History:  Family History  Problem Relation Age of Onset   Diabetes Mother    Hypertension Mother    Hypertension  Maternal Uncle    Diabetes Maternal Uncle    Hypertension Maternal Grandmother     Social History:  Social History   Socioeconomic History   Marital status: Single    Spouse name: Not on file   Number of children: Not on file   Years of education: Not on file   Highest education level: Not on file  Occupational History   Not on file  Tobacco Use   Smoking status: Never   Smokeless tobacco: Never   Tobacco comments:    never used tobacco  Vaping Use   Vaping Use: Never used  Substance and Sexual Activity   Alcohol use: No    Alcohol/week: 0.0 standard drinks   Drug use: No   Sexual activity: Yes    Birth control/protection: None  Other Topics Concern   Not on file   Social History Narrative   ** Merged History Encounter **       Social Determinants of Health   Financial Resource Strain: Not on file  Food Insecurity: No Food Insecurity   Worried About Charity fundraiser in the Last Year: Never true   [place] in the Last Year: Never true  Transportation Needs: No Transportation Needs   Lack of Transportation (Medical): No   Lack of Transportation (Non-Medical): No  Physical Activity: Not on file  Stress: Not on file  Social Connections: Not on file  Intimate Partner Violence: Not on file    Allergy: No Known Allergies  Health Maintenance:  Mammogram Up to Date: not applicable  Current Outpatient Medications: Lovenox 60 qday Prenatal vitamin  Physical Exam:   BP (!) 157/96   Pulse (!) 115   Wt 288 lb (130.6 kg)   LMP 08/08/2020   BMI 51.02 kg/m  Body mass index is 51.02 kg/m. Contractions: Not present Vag. Bleeding: None. Fundal height: not applicable  General appearance: Well nourished, well developed female in no acute distress.  Neck:  Supple, normal appearance, and no thyromegaly  Cardiovascular: S1, S2 normal, no murmur, rub or gallop, regular rate and rhythm Respiratory:  Clear to auscultation bilateral. Normal respiratory effort Abdomen: positive bowel sounds and no masses, hernias; diffusely non tender to palpation, non distended Breasts: patient denies any s/s. Neuro/Psych:  Normal mood and affect.  Skin:  Warm and dry.  Lymphatic:  No inguinal lymphadenopathy.   Pelvic exam: is not limited by body habitus EGBUS: within normal limits, Vagina: within normal limits and with no blood in the vault, Cervix: normal appearing cervix without discharge or lesions, closed/long/high, Uterus:  nonenlarged, and Adnexa:  normal adnexa and no mass, fullness, tenderness  Laboratory: No new labs  Imaging:  Narrative & Impression  CLINICAL DATA:  Evaluate viability and assess growth   EXAM: TRANSVAGINAL OB  ULTRASOUND   TECHNIQUE: Transvaginal ultrasound was performed for complete evaluation of the gestation as well as the maternal uterus, adnexal regions, and pelvic cul-de-sac.   COMPARISON:  09/27/2020   FINDINGS: Intrauterine gestational sac: Single   Yolk sac:  Visualized   Embryo:  Visualized   Cardiac Activity: Visualized   Heart Rate: 177 bpm   CRL:   33.8 mm   10 w 2 d                  [name] EDC: 05/15/2021   Subchorionic hemorrhage:  None visualized.   Maternal uterus/adnexae:   Right ovary: 2 cysts are noted measuring up to 3.4 cm. These both appear anechoic with  increased through transmission. No thickened septation or mural nodule identified.   Left ovary: Simple appearing cyst measures 2.6 cm.   Other :Posterior fibroid measures 4.5 x 3.0 x 3.9 cm   Free fluid:  None   IMPRESSION: 1. There is a single living intrauterine gestation with an estimated gestational age of [redacted] weeks and 2 days. 2. No signs of subchorionic hemorrhage.     Electronically Signed   By: Kerby Moors M.D.   On: 10/19/2020 09:00      Assessment: pt stable  Plan: 1. Supervision of high risk pregnancy, antepartum Start low dose asa after 12 weeks Offer afp after 15wks Anatomy u/s ordered  - GC/Chlamydia probe amp ([place])not at [facility] - [name] MFM OB DETAIL +14 WK; Future - CHL AMB BABYSCRIPTS SCHEDULE OPTIMIZATION - Hemoglobin A1c - Culture, OB Urine - CMP14+EGFR - Protein / creatinine ratio, urine - CBC/D/Plt+RPR+Rh+ABO+RubIgG... - Cytology - PAP( ) - AMB Referral to Cardio Obstetrics - Referral to Nutrition and Diabetes Services  2. Chronic hypertension affecting pregnancy Patient on no medications. She states she stopped her BP medication in January; pt unsure what it is. Per the chart, it looks like she was on lisinopril  Labetalol 200 bid started. RTC 1wk for BP check  OB cardiology referral made  3. Recurrent pulmonary embolism (HCC) Continue  lovenox 60 qday  4. Pre-existing type 2 diabetes mellitus during pregnancy in first trimester Pt states she stopped all insulin in January and is not regularly checking her sugars. She states she took it the other day and it "was in the 100s". Importance of CBG control d/w her and log book given and need for qid checks. RTC 1wk log check. F/u A1c.  Will need fetal echo at 22-26wks  Ask pt if has seen optho via pcp; if not, can refer at next visit  Referral to DM education made - Referral to Nutrition and Diabetes Services  5. Dysplasia of cervix, low grade (CIN 1) F/u pap and hpv today  6. AMA (advanced maternal age) multigravida 61+, first trimester F/u panorama screening  7. Obesity in pregnancy  8. [redacted] weeks gestation of pregnancy  9. BMI 50.0-59.9, adult [facility])  Problem list reviewed and updated.  Follow up in 1 weeks.  The nature of [place] with multiple MDs and other Advanced Practice Providers was explained to patient; also emphasized that residents, students are part of our team.  >50% of 45 min visit spent on counseling and coordination of care.     Durene Romans MD Attending Center for [place])

## 2020-10-20 ENCOUNTER — Encounter: Payer: Self-pay | Admitting: Obstetrics and Gynecology

## 2020-10-20 DIAGNOSIS — Z7901 Long term (current) use of anticoagulants: Secondary | ICD-10-CM | POA: Insufficient documentation

## 2020-10-20 DIAGNOSIS — O99019 Anemia complicating pregnancy, unspecified trimester: Secondary | ICD-10-CM | POA: Insufficient documentation

## 2020-10-20 DIAGNOSIS — Z2839 Other underimmunization status: Secondary | ICD-10-CM | POA: Insufficient documentation

## 2020-10-20 LAB — CBC/D/PLT+RPR+RH+ABO+RUBIGG...
Antibody Screen: NEGATIVE
Basophils Absolute: 0 10*3/uL (ref 0.0–0.2)
Basos: 0 %
EOS (ABSOLUTE): 0.2 10*3/uL (ref 0.0–0.4)
Eos: 1 %
HCV Ab: <0.1 s/co ratio (ref 0.0–0.9)
HIV Screen 4th Generation wRfx: NONREACTIVE
Hematocrit: 31.8 % — ABNORMAL LOW (ref 34.0–46.6)
Hemoglobin: 9.4 g/dL — ABNORMAL LOW (ref 11.1–15.9)
Hepatitis B Surface Ag: NEGATIVE
Immature Grans (Abs): 0.1 10*3/uL (ref 0.0–0.1)
Immature Granulocytes: 1 %
Lymphocytes Absolute: 2.2 10*3/uL (ref 0.7–3.1)
Lymphs: 15 %
MCH: 19.1 pg — ABNORMAL LOW (ref 26.6–33.0)
MCHC: 29.6 g/dL — ABNORMAL LOW (ref 31.5–35.7)
MCV: 65 fL — ABNORMAL LOW (ref 79–97)
Monocytes Absolute: 0.6 10*3/uL (ref 0.1–0.9)
Monocytes: 4 %
Neutrophils Absolute: 11.6 10*3/uL — ABNORMAL HIGH (ref 1.4–7.0)
Neutrophils: 79 %
Platelets: 487 10*3/uL — ABNORMAL HIGH (ref 150–450)
RBC: 4.93 x10E6/uL (ref 3.77–5.28)
RDW: 20.9 % — ABNORMAL HIGH (ref 11.7–15.4)
RPR Ser Ql: NONREACTIVE
Rh Factor: POSITIVE
Rubella Antibodies, IGG: <0.9 index — ABNORMAL LOW (ref >0.99)
WBC: 14.6 10*3/uL — ABNORMAL HIGH (ref 3.4–10.8)

## 2020-10-20 LAB — HEMOGLOBIN A1C
Est. average glucose Bld gHb Est-mCnc: 169 mg/dL
Hgb A1c MFr Bld: 7.5 % — ABNORMAL HIGH (ref 4.8–5.6)

## 2020-10-20 LAB — CMP14+EGFR
ALT: 14 IU/L (ref 0–32)
AST: 10 IU/L (ref 0–40)
Albumin/Globulin Ratio: 1.3 (ref 1.2–2.2)
Albumin: 4.1 g/dL (ref 3.8–4.8)
Alkaline Phosphatase: 88 IU/L (ref 44–121)
BUN/Creatinine Ratio: 12 (ref 9–23)
BUN: 8 mg/dL (ref 6–20)
Bilirubin Total: 0.3 mg/dL (ref 0.0–1.2)
CO2: 22 mmol/L (ref 20–29)
Calcium: 9.6 mg/dL (ref 8.7–10.2)
Chloride: 101 mmol/L (ref 96–106)
Creatinine, Ser: 0.66 mg/dL (ref 0.57–1.00)
Globulin, Total: 3.2 g/dL (ref 1.5–4.5)
Glucose: 174 mg/dL — ABNORMAL HIGH (ref 65–99)
Potassium: 4.1 mmol/L (ref 3.5–5.2)
Sodium: 137 mmol/L (ref 134–144)
Total Protein: 7.3 g/dL (ref 6.0–8.5)
eGFR: 117 mL/min/{1.73_m2} (ref >59)

## 2020-10-20 LAB — PROTEIN / CREATININE RATIO, URINE
Creatinine, Urine: 100.7 mg/dL
Protein, Ur: 24.9 mg/dL
Protein/Creat Ratio: 247 mg/g creat — ABNORMAL HIGH (ref 0–200)

## 2020-10-20 LAB — HCV INTERPRETATION

## 2020-10-20 MED ORDER — FERROUS GLUCONATE 324 (38 FE) MG PO TABS: 324.0000 mg | ORAL_TABLET | ORAL | 1 refills | Status: DC

## 2020-10-20 NOTE — Addendum Note (Signed)
Addended by: Aletha Halim on: 10/20/2020 02:24 PM   Modules accepted: Orders

## 2020-10-21 LAB — URINE CULTURE, OB REFLEX

## 2020-10-21 LAB — CULTURE, OB URINE

## 2020-10-25 ENCOUNTER — Encounter: Payer: Medicaid Other | Attending: Obstetrics and Gynecology | Admitting: Dietician

## 2020-10-25 LAB — CYTOLOGY - PAP
Chlamydia: NEGATIVE
Comment: NEGATIVE
Comment: NEGATIVE
Comment: NORMAL
Diagnosis: NEGATIVE
High risk HPV: NEGATIVE
Neisseria Gonorrhea: NEGATIVE

## 2020-10-27 ENCOUNTER — Other Ambulatory Visit: Payer: Self-pay

## 2020-10-27 ENCOUNTER — Encounter: Payer: Medicaid Other | Attending: Obstetrics and Gynecology | Admitting: Registered"

## 2020-10-27 ENCOUNTER — Ambulatory Visit: Payer: Medicaid Other | Admitting: Registered"

## 2020-10-27 ENCOUNTER — Encounter: Payer: Medicaid Other | Admitting: Family Medicine

## 2020-10-27 DIAGNOSIS — O09521 Supervision of elderly multigravida, first trimester: Secondary | ICD-10-CM | POA: Diagnosis not present

## 2020-10-27 DIAGNOSIS — O99211 Obesity complicating pregnancy, first trimester: Secondary | ICD-10-CM | POA: Diagnosis not present

## 2020-10-27 DIAGNOSIS — O24111 Pre-existing diabetes mellitus, type 2, in pregnancy, first trimester: Secondary | ICD-10-CM | POA: Diagnosis not present

## 2020-10-27 DIAGNOSIS — Z3A00 Weeks of gestation of pregnancy not specified: Secondary | ICD-10-CM | POA: Diagnosis not present

## 2020-10-27 DIAGNOSIS — E669 Obesity, unspecified: Secondary | ICD-10-CM | POA: Diagnosis not present

## 2020-10-27 DIAGNOSIS — I2699 Other pulmonary embolism without acute cor pulmonale: Secondary | ICD-10-CM | POA: Insufficient documentation

## 2020-10-27 DIAGNOSIS — B6989 Cysticercosis of other sites: Secondary | ICD-10-CM | POA: Diagnosis not present

## 2020-10-27 DIAGNOSIS — E118 Type 2 diabetes mellitus with unspecified complications: Secondary | ICD-10-CM | POA: Diagnosis not present

## 2020-10-27 DIAGNOSIS — O10911 Unspecified pre-existing hypertension complicating pregnancy, first trimester: Secondary | ICD-10-CM | POA: Diagnosis not present

## 2020-10-27 DIAGNOSIS — Z2839 Other underimmunization status: Secondary | ICD-10-CM | POA: Diagnosis not present

## 2020-10-27 DIAGNOSIS — N760 Acute vaginitis: Secondary | ICD-10-CM | POA: Insufficient documentation

## 2020-10-27 DIAGNOSIS — Z7901 Long term (current) use of anticoagulants: Secondary | ICD-10-CM | POA: Insufficient documentation

## 2020-10-27 DIAGNOSIS — O0991 Supervision of high risk pregnancy, unspecified, first trimester: Secondary | ICD-10-CM | POA: Diagnosis not present

## 2020-10-27 DIAGNOSIS — R4589 Other symptoms and signs involving emotional state: Secondary | ICD-10-CM | POA: Insufficient documentation

## 2020-10-27 NOTE — Progress Notes (Signed)
Patient was seen for pre-existing type 2 diabetes in pregnancy self-management on 10/27/20  Start time 1315 and End time 1415   Estimated due date: 05/15/21; [redacted]w[redacted]d Clinical: Medications: Lantus in chart was from hospital adx. Patient states has not used since January 2022 Medical History: DVT, anemia in pregnancy, chronic HTN, pulmonary embolism Labs: OGTT n/a, A1c 7.5% on 10/19/20   Dietary and Lifestyle History: Patient states she was not aware of the type 2 diabetes diagnosis. Pt states the only time she has used insulin was after [place] Pt states she has not received any prior diabetes education. Patient was concerned about health of baby and appeared motivated to learn.   RD encouraged patient to start by cutting out, or at least back, on juice and sugar sweetened beverages. We also discussed serving sizes of rice and spaghetti.  At last MD visit patient states she was told that after she has more blood sugar readings they MD will decide if she needs to start using insulin again. RD instructed Pt to transfer her blood sugar readings to the log sheet and bring to her MD appointment tomorrow.   RD introduced her to the options of pens and Omnipod. Also asked if she would be interested in CGM   Physical Activity: 2x/week on days off walks 30 min. Works in [employer] but mostly standing Stress: Sleep:  24 hr Recall: First Meal: bagel, fruit or yoplait yogurt OR eggs, grits, t[place]uwaitbacon Snack: fruit Second meal: salad OR 2 cups spaghetti, broccoli, cheese  Snack: crackers or chips Third meal: Chicken (rice or mac n cheese, or potatoes), broccoli OR hamburger helper Snack: fruit snacks Beverages: water milk, fruit flavored soda 3x/month, sweet tea 2-3x/month, juice, lemonade  NUTRITION INTERVENTION  Nutrition education (E-1) on the following topics:   Initial Follow-up  _0  _1  Definition of Gestational Diabetes _2  _3  Why dietary management  is important in controlling blood glucose _4  _5  Effects each nutrient has on blood glucose levels _6  _7  Simple carbohydrates vs complex carbohydrates _8  _9  Fluid intake _10  _11  Creating a balanced meal plan _12  _13  Carbohydrate counting (basics of foods she reported eating) _14  _15  When to check blood glucose levels _16  _17  Proper blood glucose monitoring techniques _18  _19  Effect of stress and stress reduction techniques  _20  _21  Exercise effect on blood glucose levels, appropriate exercise during pregnancy _22  _23  Importance of limiting caffeine and abstaining from alcohol and smoking _24  _25  Medications used for blood sugar control during pregnancy _26  _27  Hypoglycemia and rule of 15 _28  _29  Postpartum self care  Patient already has a meter, is testing 3-4x/daily; did not bring log or meter. Pt reports FBS: 130-140 mg/dL Postprandial: 160-170 mg/dL  Patient instructed to monitor glucose levels: FBS: 60 - ? 95 mg/dL (some clinics use 90 for cutoff) 1 hour: ? 140 mg/dL 2 hour: ? 120 mg/dL  Patient received handouts: Nutrition Diabetes and Pregnancy Carbohydrate Counting List Glucose log sheet  Patient was encouraged to schedule a follow-up visit, but she did not schedule when checking out.

## 2020-10-28 ENCOUNTER — Ambulatory Visit (INDEPENDENT_AMBULATORY_CARE_PROVIDER_SITE_OTHER): Payer: Medicaid Other | Admitting: Family Medicine

## 2020-10-28 VITALS — BP 134/87 | HR 109 | Wt 289.1 lb

## 2020-10-28 DIAGNOSIS — O10919 Unspecified pre-existing hypertension complicating pregnancy, unspecified trimester: Secondary | ICD-10-CM

## 2020-10-28 DIAGNOSIS — O09521 Supervision of elderly multigravida, first trimester: Secondary | ICD-10-CM

## 2020-10-28 DIAGNOSIS — O24111 Pre-existing diabetes mellitus, type 2, in pregnancy, first trimester: Secondary | ICD-10-CM

## 2020-10-28 DIAGNOSIS — I2699 Other pulmonary embolism without acute cor pulmonale: Secondary | ICD-10-CM

## 2020-10-28 DIAGNOSIS — N760 Acute vaginitis: Secondary | ICD-10-CM

## 2020-10-28 DIAGNOSIS — Z2839 Other underimmunization status: Secondary | ICD-10-CM

## 2020-10-28 DIAGNOSIS — O0990 Supervision of high risk pregnancy, unspecified, unspecified trimester: Secondary | ICD-10-CM

## 2020-10-28 DIAGNOSIS — B9689 Other specified bacterial agents as the cause of diseases classified elsewhere: Secondary | ICD-10-CM

## 2020-10-28 DIAGNOSIS — O99210 Obesity complicating pregnancy, unspecified trimester: Secondary | ICD-10-CM

## 2020-10-28 MED ORDER — ASPIRIN EC 81 MG PO TBEC: 81.0000 mg | DELAYED_RELEASE_TABLET | Freq: Every day | ORAL | 11 refills | Status: DC

## 2020-10-28 MED ORDER — CLINDAMYCIN PHOSPHATE 1 % EX GEL: Freq: Two times a day (BID) | CUTANEOUS | 11 refills | Status: DC

## 2020-10-28 NOTE — Progress Notes (Signed)
Pt states that she is not taking Insulin.

## 2020-10-28 NOTE — Progress Notes (Signed)
PRENATAL VISIT NOTE  Subjective:  Virginia Fitzgerald is a 35 y.o. [id number] at 84w4dbeing seen today for ongoing prenatal care.  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  Patient reports no complaints.  Contractions: Not present. Vag. Bleeding: None.  Movement: Absent. Denies leaking of fluid.   The following portions of the patient's history were reviewed and updated as appropriate: allergies, current medications, past family history, past medical history, past social history, past surgical history and problem list.   Objective:   Vitals:   10/28/20 1100  BP: 134/87  Pulse: (!) 109  Weight: 289 lb 1.6 oz (131.1 kg)    Fetal Status: Fetal Heart Rate (bpm): 172   Movement: Absent     General:  Alert, oriented and cooperative. Patient is in no acute distress.  Skin: Skin is warm and dry. No rash noted.   Cardiovascular: Normal heart rate noted  Respiratory: Normal respiratory effort, no problems with respiration noted  Abdomen: Soft, gravid, appropriate for gestational age.  Pain/Pressure: Absent     Pelvic: Cervical exam deferred        Extremities: Normal range of motion.  Edema:  None  Mental Status: Normal mood and affect. Normal behavior. Normal judgment and thought content.   Assessment and Plan:  Pregnancy: [id number] 181w4d. Chronic hypertension affecting pregnancy BP is better controlled on Labetalol--add ASA - aspirin EC 81 MG tablet; Take 1 tablet (81 mg total) by mouth daily. Swallow whole.  Dispense: 30 tablet; Refill: 11  2. Recurrent pulmonary embolism (HCC) On Lovenox  3. Pre-existing type 2 diabetes mellitus during pregnancy in first trimester Has not started her insulin Will begin--wants omniod   - aspirin EC 81 MG tablet; Take 1 tablet (81 mg total) by mouth daily. Swallow whole.  Dispense: 30 tablet; Refill: 11  4. Obesity in pregnancy   5. Supervision of high risk pregnancy, antepartum   6. AMA (advanced maternal age) multigravida 3564+first trimester NIPT is low FF--will get quad only - aspirin EC 81 MG tablet; Take 1 tablet (81 mg total) by mouth daily. Swallow whole.  Dispense: 30 tablet; Refill: 11  7. Rubella non-immune status, antepartum Will need MMR pp  8. Bacterial vaginosis Rx given--noted on pap smear - clindamycin (CLINDAGEL) 1 % gel; Apply topically 2 (two) times daily.  Dispense: 30 g; Refill: 11  General obstetric precautions including but not limited to vaginal bleeding, contractions, leaking of fluid and fetal movement were reviewed in detail with the patient. Please refer to After Visit Summary for other counseling recommendations.   Return in 2 weeks (on 11/11/2020) [facility], diabetes educator 1-2 wks for omnipod.  Future Appointments  Date Time Provider [place]10/18/2022  9:35 AM Constant, Peggy, MD [facility]/28/2022  3:20 PM Tobb, Kardie, DO [facility] None  12/19/2020  9:30 AM  [facility] NURSE [facility]  12/19/2020  9:45 AM [facility] US4 [facility]    Donnamae Jude, MD

## 2020-10-28 NOTE — Patient Instructions (Addendum)
Following an appropriate diet and keeping your blood sugar under control is the most important thing to do for your health and that of your unborn baby.  Please check your blood sugar 4 times daily.  Please keep accurate BS logs and bring them with you to every visit.  Please bring your meter also.  Goals for Blood sugar should be: 1. Fasting (first thing in the morning before eating) should be less than 90.   2.  2 hours after meals should be less than 120.  Please eat 3 meals and 3 snacks.  Include protein (meat, dairy-cheese, eggs, nuts) with all meals.  Be mindful that carbohydrates increase your blood sugar.  Not just sweet food (cookies, cake, donuts, fruit, juice, soda) but also bread, pasta, rice, and potatoes.  You have to limit how many carbs you are eating.  Adding exercise, as little as 30 minutes a day can decrease your blood sugar.  First Trimester of Pregnancy The first trimester of pregnancy starts on the first day of your last menstrual period until the end of week 12. This is months 1 through 3 of pregnancy. A week after a sperm fertilizes an egg, the egg will implant into the wall of the uterus and begin to develop into a baby. By the end of 12 weeks, all the baby's organs will be formed and the baby will be 2-3 inches in size. Body changes during your first trimester Your body goes through many changes during pregnancy. The changes vary and generally return to normal after your baby is born. Physical changes You may gain or lose weight. Your breasts may begin to grow larger and become tender. The tissue that surrounds your nipples (areola) may become darker. Dark spots or blotches (chloasma or mask of pregnancy) may develop on your face. You may have changes in your hair. These can include thickening or thinning of your hair or changes in texture. Health changes You may feel nauseous, and you may vomit. You may have heartburn. You may develop headaches. You may  develop constipation. Your gums may bleed and may be sensitive to brushing and flossing. Other changes You may tire easily. You may urinate more often. Your menstrual periods will stop. You may have a loss of appetite. You may develop cravings for certain kinds of food. You may have changes in your emotions from day to day. You may have more vivid and strange dreams. Follow these instructions at home: Medicines Follow your health care provider's instructions regarding medicine use. Specific medicines may be either safe or unsafe to take during pregnancy. Do not take any medicines unless told to by your health care provider. Take a prenatal vitamin that contains at least 600 micrograms (mcg) of folic acid. Eating and drinking Eat a healthy diet that includes fresh fruits and vegetables, whole grains, good sources of protein such as meat, eggs, or tofu, and low-fat dairy products. Avoid raw meat and unpasteurized juice, milk, and cheese. These carry germs that can harm you and your baby. If you feel nauseous or you vomit: Eat 4 or 5 small meals a day instead of 3 large meals. Try eating a few soda crackers. Drink liquids between meals instead of during meals. You may need to take these actions to prevent or treat constipation: Drink enough fluid to keep your urine pale yellow. Eat foods that are high in fiber, such as beans, whole grains, and fresh fruits and vegetables. Limit foods that are high in fat and processed  sugars, such as fried or sweet foods. Activity Exercise only as directed by your health care provider. Most people can continue their usual exercise routine during pregnancy. Try to exercise for 30 minutes at least 5 days a week. Stop exercising if you develop pain or cramping in the lower abdomen or lower back. Avoid exercising if it is very hot or humid or if you are at high altitude. Avoid heavy lifting. If you choose to, you may have sex unless your health care provider  tells you not to. Relieving pain and discomfort Wear a good support bra to relieve breast tenderness. Rest with your legs elevated if you have leg cramps or low back pain. If you develop bulging veins (varicose veins) in your legs: Wear support hose as told by your health care provider. Elevate your feet for 15 minutes, 3-4 times a day. Limit salt in your diet. Safety Wear your seat belt at all times when driving or riding in a car. Talk with your health care provider if someone is verbally or physically abusive to you. Talk with your health care provider if you are feeling sad or have thoughts of hurting yourself. Lifestyle Do not use hot tubs, steam rooms, or saunas. Do not douche. Do not use tampons or scented sanitary pads. Do not use herbal remedies, alcohol, illegal drugs, or medicines that are not approved by your health care provider. Chemicals in these products can harm your baby. Do not use any products that contain nicotine or tobacco, such as cigarettes, e-cigarettes, and chewing tobacco. If you need help quitting, ask your health care provider. Avoid cat litter boxes and soil used by cats. These carry germs that can cause birth defects in the baby and possibly loss of the unborn baby (fetus) by miscarriage or stillbirth. General instructions During routine prenatal visits in the first trimester, your health care provider will do a physical exam, perform necessary tests, and ask you how things are going. Keep all follow-up visits. This is important. Ask for help if you have counseling or nutritional needs during pregnancy. Your health care provider can offer advice or refer you to specialists for help with various needs. Schedule a dentist appointment. At home, brush your teeth with a soft toothbrush. Floss gently. Write down your questions. Take them to your prenatal visits. Where to find more information American Pregnancy Association: americanpregnancy.[place] and Gynecologists: PoolDevices.com.pt Office on [employer] Health: KeywordPortfolios.com.br Contact a health care provider if you have: Dizziness. A fever. Mild pelvic cramps, pelvic pressure, or nagging pain in the abdominal area. Nausea, vomiting, or diarrhea that lasts for 24 hours or longer. A bad-smelling vaginal discharge. Pain when you urinate. Known exposure to a contagious illness, such as chickenpox, measles, Zika virus, HIV, or hepatitis. Get help right away if you have: Spotting or bleeding from your vagina. Severe abdominal cramping or pain. Shortness of breath or chest pain. Any kind of trauma, such as from a fall or a car crash. New or increased pain, swelling, or redness in an arm or leg. Summary The first trimester of pregnancy starts on the first day of your last menstrual period until the end of week 12 (months 1 through 3). Eating 4 or 5 small meals a day rather than 3 large meals may help to relieve nausea and vomiting. Do not use any products that contain nicotine or tobacco, such as cigarettes, e-cigarettes, and chewing tobacco. If you need help quitting, ask your health care provider. Keep all  follow-up visits. This is important. This information is not intended to replace advice given to you by your health care provider. Make sure you discuss any questions you have with your health care provider. Document Revised: 06/24/2019 Document Reviewed: 04/30/2019 Elsevier Patient Education  2022 [employer].

## 2020-10-29 ENCOUNTER — Encounter: Payer: Self-pay | Admitting: Radiology

## 2020-10-29 DIAGNOSIS — E119 Type 2 diabetes mellitus without complications: Secondary | ICD-10-CM

## 2020-11-01 ENCOUNTER — Encounter: Payer: Self-pay | Admitting: General Practice

## 2020-11-01 MED ORDER — OMNIPOD DASH INTRO (GEN 4) KIT: 105.0000 [IU] | PACK | 0 refills | Status: DC

## 2020-11-01 MED ORDER — INSULIN LISPRO 100 UNIT/ML IJ SOLN: 105.0000 [IU] | Freq: Every day | INTRAMUSCULAR | 11 refills | Status: DC

## 2020-11-01 MED ORDER — DEXCOM G6 RECEIVER DEVI: 1.0000 [IU] | 0 refills | Status: DC

## 2020-11-01 MED ORDER — DEXCOM G6 SENSOR MISC: 1.0000 [IU] | 3 refills | Status: DC

## 2020-11-01 MED ORDER — OMNIPOD DASH PODS (GEN 4) MISC: 105.0000 [IU] | 11 refills | Status: DC

## 2020-11-09 DIAGNOSIS — E119 Type 2 diabetes mellitus without complications: Secondary | ICD-10-CM

## 2020-11-10 ENCOUNTER — Telehealth: Payer: Self-pay | Admitting: Registered"

## 2020-11-10 NOTE — Telephone Encounter (Signed)
Heather at [facility] verified that they had the order for the [place]. Although the Rx was in the system, they had not filled it yet. Nira Conn ran the prescription and it went through at $0 co-pay, however they did not have it in stock. It has been ordered and Nira Conn anticipates it will be in tomorrow (Friday) and the patient will be called when it is ready.  After patient has the picked up the pods from [facility], she needs to call [employer] and choose option #5 to have the starter kit with the controller mailed directly to her.   After patient completes this step an Omnipod trainer will be assigned to her and they will reach out to [facility] to make and appointment for training.

## 2020-11-13 ENCOUNTER — Other Ambulatory Visit: Payer: Self-pay

## 2020-11-13 ENCOUNTER — Inpatient Hospital Stay (HOSPITAL_COMMUNITY)
Admission: AD | Admit: 2020-11-13 | Discharge: 2020-11-13 | Disposition: A | Discharge status: Home / Self Care | Payer: Medicaid Other | Attending: Obstetrics & Gynecology | Admitting: Obstetrics & Gynecology

## 2020-11-13 DIAGNOSIS — Z86718 Personal history of other venous thrombosis and embolism: Secondary | ICD-10-CM | POA: Insufficient documentation

## 2020-11-13 DIAGNOSIS — Z3A13 13 weeks gestation of pregnancy: Secondary | ICD-10-CM | POA: Diagnosis not present

## 2020-11-13 DIAGNOSIS — R519 Headache, unspecified: Secondary | ICD-10-CM

## 2020-11-13 DIAGNOSIS — O26891 Other specified pregnancy related conditions, first trimester: Secondary | ICD-10-CM | POA: Diagnosis present

## 2020-11-13 DIAGNOSIS — R0602 Shortness of breath: Secondary | ICD-10-CM | POA: Diagnosis not present

## 2020-11-13 DIAGNOSIS — G44209 Tension-type headache, unspecified, not intractable: Secondary | ICD-10-CM

## 2020-11-13 DIAGNOSIS — O99351 Diseases of the nervous system complicating pregnancy, first trimester: Secondary | ICD-10-CM

## 2020-11-13 DIAGNOSIS — W208XXA Other cause of strike by thrown, projected or falling object, initial encounter: Secondary | ICD-10-CM | POA: Diagnosis not present

## 2020-11-13 LAB — URINALYSIS, ROUTINE W REFLEX MICROSCOPIC
Bilirubin Urine: NEGATIVE
Glucose, UA: NEGATIVE mg/dL
Hgb urine dipstick: NEGATIVE
Ketones, ur: NEGATIVE mg/dL
Nitrite: NEGATIVE
Protein, ur: 100 mg/dL — AB
Specific Gravity, Urine: 1.017 (ref 1.005–1.030)
pH: 7 (ref 5.0–8.0)
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
## 2020-11-14 LAB — CULTURE, OB URINE

## 2020-11-15 ENCOUNTER — Ambulatory Visit (INDEPENDENT_AMBULATORY_CARE_PROVIDER_SITE_OTHER): Payer: Medicaid Other | Admitting: Obstetrics and Gynecology

## 2020-11-15 ENCOUNTER — Encounter: Payer: Self-pay | Admitting: General Practice

## 2020-11-15 ENCOUNTER — Encounter: Payer: Self-pay | Admitting: Obstetrics and Gynecology

## 2020-11-15 ENCOUNTER — Encounter: Payer: Self-pay | Admitting: Registered"

## 2020-11-15 ENCOUNTER — Other Ambulatory Visit: Payer: Self-pay

## 2020-11-15 VITALS — BP 135/67 | HR 97 | Wt 288.8 lb

## 2020-11-15 DIAGNOSIS — O10919 Unspecified pre-existing hypertension complicating pregnancy, unspecified trimester: Secondary | ICD-10-CM

## 2020-11-15 DIAGNOSIS — O24111 Pre-existing diabetes mellitus, type 2, in pregnancy, first trimester: Secondary | ICD-10-CM

## 2020-11-15 DIAGNOSIS — O09899 Supervision of other high risk pregnancies, unspecified trimester: Secondary | ICD-10-CM

## 2020-11-15 DIAGNOSIS — O0990 Supervision of high risk pregnancy, unspecified, unspecified trimester: Secondary | ICD-10-CM

## 2020-11-15 DIAGNOSIS — O09521 Supervision of elderly multigravida, first trimester: Secondary | ICD-10-CM

## 2020-11-15 DIAGNOSIS — Z2839 Other underimmunization status: Secondary | ICD-10-CM

## 2020-11-15 DIAGNOSIS — O99210 Obesity complicating pregnancy, unspecified trimester: Secondary | ICD-10-CM

## 2020-11-15 DIAGNOSIS — R4589 Other symptoms and signs involving emotional state: Secondary | ICD-10-CM

## 2020-11-15 DIAGNOSIS — Z7901 Long term (current) use of anticoagulants: Secondary | ICD-10-CM

## 2020-11-15 NOTE — Progress Notes (Signed)
Patient has MD visit today with Dr. Elly Modena. RD met with patient to help get Omnipod supplies that have been ordered from pharmacy

## 2020-11-15 NOTE — Addendum Note (Signed)
Addended by: Bethanne Ginger on: 11/15/2020 02:57 PM   Modules accepted: Orders

## 2020-11-15 NOTE — Progress Notes (Signed)
   PRENATAL VISIT NOTE  Subjective:  Virginia Fitzgerald is a 35 y.o. [id number] at [redacted]w[redacted]d being seen today for ongoing prenatal care.  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  Patient reports no complaints.  Contractions: Not present. Vag. Bleeding: None.  Movement: Absent. Denies leaking of fluid.   The following portions of the patient's history were reviewed and updated as appropriate: allergies, current medications, past family history, past medical history, past social history, past surgical history and problem list.   Objective:   Vitals:   11/15/20 0939  BP: 135/67  Pulse: 97  Weight: 288 lb 12.8 oz (131 kg)    Fetal Status:     Movement: Absent     General:  Alert, oriented and cooperative. Patient is in no acute distress.  Skin: Skin is warm and dry. No rash noted.   Cardiovascular: Normal heart rate noted  Respiratory: Normal respiratory effort, no problems with respiration noted  Abdomen: Soft, gravid, appropriate for gestational age.  Pain/Pressure: Absent     Pelvic: Cervical exam deferred        Extremities: Normal range of motion.  Edema: None  Mental Status: Normal mood and  affect. Normal behavior. Normal judgment and thought content.   Assessment and Plan:  Pregnancy: [id number] at [redacted]w[redacted]d 1. Supervision of high risk pregnancy, antepartum Patient is doing well without complaints Anatomy ultrasound scheduled  2. Pre-existing type 2 diabetes mellitus during pregnancy in first trimester Patient did not bring log but reports fasting as high as 120 and pp as high as 154 She has not started insulin and does not have all her supplies Diabetic educator to see patient today  3. Chronic hypertension affecting pregnancy Stable on labetalol 200 BID Continue ASA  4. Obesity in pregnancy   5. AMA (advanced maternal age) multigravida 57+, first trimester   6. Rubella non-immune status, antepartum Will offer pp  7. Anticoagulation in pregnancy Patient with history of recurrent PE Continue therapeutic lovenox  Preterm labor symptoms and general obstetric precautions including but not limited to vaginal bleeding, contractions, leaking of fluid and fetal movement were reviewed in detail with the patient. Please refer to After Visit Summary for other counseling recommendations.   No follow-ups on file.  Future Appointments  Date Time Provider [place]  11/22/2020 11:15 AM [facility]  11/25/2020  3:20 PM Berniece Salines, DO [facility] None  12/19/2020  9:30 AM [facility] NURSE [facility]  12/19/2020  9:45 AM [facility] US4 [facility] [place]    Mora Bellman, MD

## 2020-11-16 ENCOUNTER — Telehealth: Payer: Self-pay | Admitting: Lactation Services

## 2020-11-16 MED ORDER — DEXCOM G6 TRANSMITTER MISC: 1.0000 | 1 refills | Status: DC

## 2020-11-16 MED ORDER — OMNIPOD DASH PODS (GEN 4) MISC: 1.0000 | 7 refills | Status: DC

## 2020-11-16 MED ORDER — DEXCOM G6 RECEIVER DEVI: 0 refills | Status: DC

## 2020-11-16 MED ORDER — DEXCOM G6 SENSOR MISC: 1.0000 | 7 refills | Status: AC

## 2020-11-16 NOTE — Telephone Encounter (Signed)
PA for [facility] faxed to [employer]. Fax confirmation received.   Called and spoke with patient, she is not able to get her Omnipod from [facility]. [facility] [address]. Patient does not have a smart phone compatible with the Dexcom so will need Receiver.   Reviewed with patient that all Omnipod/Dexcom [facility] [address] at patients request and that we are working on getting PA approved for [employer].

## 2020-11-18 ENCOUNTER — Telehealth: Payer: Self-pay | Admitting: Registered"

## 2020-11-18 NOTE — Telephone Encounter (Signed)
[facility] and verified that [facility]s are ready for pick up. Called patient to let her know they are ready and to call [employer] and choose option #5 to get the controller mailed to her. After she completes that step Irven Baltimore will get notification that she needs training and reminded patient to watch for a call from [employer].

## 2020-11-21 ENCOUNTER — Encounter: Payer: Self-pay | Admitting: Obstetrics and Gynecology

## 2020-11-21 ENCOUNTER — Telehealth: Payer: Self-pay

## 2020-11-21 DIAGNOSIS — O285 Abnormal chromosomal and genetic finding on antenatal screening of mother: Secondary | ICD-10-CM | POA: Insufficient documentation

## 2020-11-21 NOTE — Telephone Encounter (Signed)
Pt notified via mychart and is agreeable to plan of care. Pt scheduled for labs on 10/25 at 920am.  Colletta [place], RN

## 2020-11-21 NOTE — Telephone Encounter (Signed)
-----   Message from Aletha Halim, MD sent at 11/21/2020  1:05 PM EDT ----- Regarding: re do her panorama tomorrow (10/24) It looks likes she has a DM education visit tomorrow. I tried to call her because she had a low fetal fraction at 10wks and I recommend re-draw her panorama.   Can you set her up for a nurse visit for tomorrow to re-draw her panorama and do her afp then too? thanks

## 2020-11-22 ENCOUNTER — Other Ambulatory Visit: Payer: Medicaid Other

## 2020-11-22 ENCOUNTER — Encounter: Payer: Medicaid Other | Attending: Obstetrics and Gynecology | Admitting: Registered"

## 2020-11-22 ENCOUNTER — Telehealth: Payer: Self-pay | Admitting: Lactation Services

## 2020-11-22 ENCOUNTER — Other Ambulatory Visit: Payer: Self-pay

## 2020-11-22 ENCOUNTER — Ambulatory Visit: Payer: Medicaid Other | Admitting: Registered"

## 2020-11-22 DIAGNOSIS — E669 Obesity, unspecified: Secondary | ICD-10-CM | POA: Diagnosis not present

## 2020-11-22 DIAGNOSIS — B6989 Cysticercosis of other sites: Secondary | ICD-10-CM | POA: Diagnosis not present

## 2020-11-22 DIAGNOSIS — O0991 Supervision of high risk pregnancy, unspecified, first trimester: Secondary | ICD-10-CM | POA: Insufficient documentation

## 2020-11-22 DIAGNOSIS — E118 Type 2 diabetes mellitus with unspecified complications: Secondary | ICD-10-CM | POA: Diagnosis not present

## 2020-11-22 DIAGNOSIS — Z7901 Long term (current) use of anticoagulants: Secondary | ICD-10-CM | POA: Diagnosis not present

## 2020-11-22 DIAGNOSIS — Z2839 Other underimmunization status: Secondary | ICD-10-CM | POA: Insufficient documentation

## 2020-11-22 DIAGNOSIS — R4589 Other symptoms and signs involving emotional state: Secondary | ICD-10-CM | POA: Diagnosis not present

## 2020-11-22 DIAGNOSIS — O09521 Supervision of elderly multigravida, first trimester: Secondary | ICD-10-CM | POA: Insufficient documentation

## 2020-11-22 DIAGNOSIS — N760 Acute vaginitis: Secondary | ICD-10-CM | POA: Insufficient documentation

## 2020-11-22 DIAGNOSIS — O24111 Pre-existing diabetes mellitus, type 2, in pregnancy, first trimester: Secondary | ICD-10-CM

## 2020-11-22 DIAGNOSIS — O10911 Unspecified pre-existing hypertension complicating pregnancy, first trimester: Secondary | ICD-10-CM | POA: Diagnosis not present

## 2020-11-22 DIAGNOSIS — O99211 Obesity complicating pregnancy, first trimester: Secondary | ICD-10-CM | POA: Diagnosis not present

## 2020-11-22 DIAGNOSIS — O0990 Supervision of high risk pregnancy, unspecified, unspecified trimester: Secondary | ICD-10-CM

## 2020-11-22 DIAGNOSIS — I2699 Other pulmonary embolism without acute cor pulmonale: Secondary | ICD-10-CM | POA: Diagnosis not present

## 2020-11-22 DIAGNOSIS — Z3A00 Weeks of gestation of pregnancy not specified: Secondary | ICD-10-CM | POA: Diagnosis not present

## 2020-11-22 NOTE — Telephone Encounter (Signed)
Faxed PA for Dexcom G6 to [facility].

## 2020-11-22 NOTE — Progress Notes (Signed)
Patient was seen on 11/22/2020 for follow-up assessment and education for Type 2 Diabetes. EDD 05/15/2021.   [facility] . Pt states she will go over this afternoon and after she has the pods will call [employer] to get the PDM controller sent to her.  Dexcom CGM is still hung up in pre-auth. RD will follow-up tomorrow. After patient has Dexcom will set up appointment with RD to learn how to use.  Pt reports improved blood sugar readings. Patient states changes to diet/lifestyle including drinking more water and still eating fruits and vegetables.    Pt reports blood sugar ranging from 90-145 mg/dL FBS 124, 119 mg/dL 2-hr PPBG 123 after chicken tenders and fries  The following learning objectives reviewed during follow-up visit:  Steps to take to get all the supplies needed to start using the Omnipod insulin pump Carb counting using food models and handout Planning Healthy Meals. Patient demonstrated ability to create balanced meals with appropriate carbohdrates.  Plan:  Follow steps provided to get all supplies for Omnipod Respond to calls/text messages from [facility] and Irven Baltimore to make sure to get Omnipod training set up soon After getting Dexcom set up appt for training to use CGM Continue using Accu chek until CGM received   Patient instructed to monitor glucose levels: FBS: 60 - 95 mg/dl 2 hour: <120 mg/dl  Patient received the following handouts: Planning Healthy Meals  Patient will be seen for follow-up to learn how to use CGM and then again 2 weeks after starting Omnipod.

## 2020-11-24 ENCOUNTER — Encounter: Payer: Self-pay | Admitting: Obstetrics and Gynecology

## 2020-11-24 ENCOUNTER — Telehealth: Payer: Self-pay | Admitting: *Deleted

## 2020-11-24 DIAGNOSIS — O285 Abnormal chromosomal and genetic finding on antenatal screening of mother: Secondary | ICD-10-CM

## 2020-11-24 DIAGNOSIS — R772 Abnormality of alphafetoprotein: Secondary | ICD-10-CM | POA: Insufficient documentation

## 2020-11-24 LAB — AFP, SERUM, OPEN SPINA BIFIDA
AFP MoM: 3.59
AFP Value: 67.3 ng/mL
Gest. Age on Collection Date: 15.1 weeks
Maternal Age At EDD: 35.9 yr
OSBR Risk 1 IN: 28
Test Results:: POSITIVE — AB
Weight: 290 [lb_av]

## 2020-11-24 NOTE — Telephone Encounter (Signed)
I called MFM to schedule MFM consult and genetic counseling. Per protocol also needs [name] with the consult. Will move already scheduled [name] appointment to 12/05/20 .  I called Virginia Fitzgerald and explained she had a positive AFP screen that does not confirm there is a genetic issue, but may be. I explained Dr. Ilda Basset recommends Genetic counseling and MFM consult and MFM protocol is to get an [name] with consult so we have moved [name] to 12/05/20. I explained she will need to arrive at 0915 and will be here most of the morning for Nurse visit with MFM, [name], MD consult and genetic counseling. I explained they will review all her results and explain and they may recommend further testing. She voices understanding. Jadalynn Burr,RN

## 2020-11-24 NOTE — Telephone Encounter (Signed)
-----   Message from Aletha Halim, MD sent at 11/24/2020  8:44 AM EDT ----- Please call her that her afp came back + and that her repeat panorama isn't back yet and let her know that she needs to see mfm and genetics to talk about what this means.  Patient needs asap mfm consult and genetic counseling visit in 10 days and please make a note to scan in her repeat panorama results when they come in so the genetics counseling people can see it. thanks

## 2020-11-25 ENCOUNTER — Ambulatory Visit: Payer: Medicaid Other | Admitting: Cardiology

## 2020-11-29 ENCOUNTER — Telehealth: Payer: Self-pay | Admitting: General Practice

## 2020-11-29 DIAGNOSIS — R772 Abnormality of alphafetoprotein: Secondary | ICD-10-CM

## 2020-11-29 NOTE — Telephone Encounter (Signed)
Opened in error

## 2020-11-30 ENCOUNTER — Other Ambulatory Visit: Payer: Self-pay

## 2020-12-01 ENCOUNTER — Other Ambulatory Visit: Payer: Self-pay

## 2020-12-05 ENCOUNTER — Ambulatory Visit (HOSPITAL_BASED_OUTPATIENT_CLINIC_OR_DEPARTMENT_OTHER): Payer: Medicaid Other

## 2020-12-05 ENCOUNTER — Other Ambulatory Visit: Payer: Self-pay | Admitting: *Deleted

## 2020-12-05 ENCOUNTER — Ambulatory Visit: Payer: Medicaid Other | Admitting: *Deleted

## 2020-12-05 ENCOUNTER — Ambulatory Visit (HOSPITAL_BASED_OUTPATIENT_CLINIC_OR_DEPARTMENT_OTHER): Payer: Medicaid Other | Admitting: Obstetrics and Gynecology

## 2020-12-05 ENCOUNTER — Other Ambulatory Visit: Payer: Self-pay

## 2020-12-05 ENCOUNTER — Ambulatory Visit: Payer: Medicaid Other | Attending: Obstetrics and Gynecology

## 2020-12-05 VITALS — BP 139/88 | HR 99

## 2020-12-05 DIAGNOSIS — Z95828 Presence of other vascular implants and grafts: Secondary | ICD-10-CM | POA: Diagnosis present

## 2020-12-05 DIAGNOSIS — O289 Unspecified abnormal findings on antenatal screening of mother: Secondary | ICD-10-CM

## 2020-12-05 DIAGNOSIS — O10012 Pre-existing essential hypertension complicating pregnancy, second trimester: Secondary | ICD-10-CM | POA: Insufficient documentation

## 2020-12-05 DIAGNOSIS — O24112 Pre-existing diabetes mellitus, type 2, in pregnancy, second trimester: Secondary | ICD-10-CM

## 2020-12-05 DIAGNOSIS — O09522 Supervision of elderly multigravida, second trimester: Secondary | ICD-10-CM

## 2020-12-05 DIAGNOSIS — O99212 Obesity complicating pregnancy, second trimester: Secondary | ICD-10-CM | POA: Diagnosis present

## 2020-12-05 DIAGNOSIS — O285 Abnormal chromosomal and genetic finding on antenatal screening of mother: Secondary | ICD-10-CM | POA: Insufficient documentation

## 2020-12-05 DIAGNOSIS — Z3A17 17 weeks gestation of pregnancy: Secondary | ICD-10-CM | POA: Insufficient documentation

## 2020-12-05 DIAGNOSIS — O0990 Supervision of high risk pregnancy, unspecified, unspecified trimester: Secondary | ICD-10-CM | POA: Diagnosis present

## 2020-12-05 NOTE — Progress Notes (Signed)
Maternal-Fetal Medicine   Name: Virginia Fitzgerald DOB: 1985/10/18 MRN: [id number] Referring Provider: Mora Bellman, MD.  I had the pleasure of seeing [name] [facility] at [facility]. She is G5 P3 at 17-weeks' gestation and is here for fetal anatomy scan. Her problems include: -Increased MSAFP (3.5 MoM). on serum screening. Risk of ONTD is 1 in 69. -Pregestational diabetes.  Patient reports she has diabetes for a year.  She met with our diabetic educator.  She has not started taking insulin yet.  She reports her fasting blood glucose levels are between 90 and 120 mg/DL and postprandial levels are between 130 and 145 mg/DL.  She has not had ophthalmological examination to rule out retinopathy.  Recent hemoglobin level (1 month ago) was 7.5%. -Chronic hypertension (from problem list).  Patient takes labetalol 200 mg twice daily. -History of pulmonary embolism in 2020 following delivery of her third child.  Patient takes Lovenox 60 mg subcutaneously daily. -Class 3 obesity. -Advanced maternal age.  On cell free fetal DNA screening, the risks of fetal aneuploidies are not increased.  Obstetric history significant for 3 term vaginal deliveries. -2004: Term vaginal delivery of a female infant weighing 7 pounds at birth. -2010: Term vaginal delivery of a female infant weighing 7 pounds and 5 ounces at birth. -2001 term vaginal delivery of a female infant weighing 7 pounds at birth.  She had postpartum pulmonary embolism. GYN history: No history of abnormal Pap smears or cervical surgeries.  No history of breast disease. Past surgical history: D&C. Medications: Prenatal vitamins, Lovenox, labetalol, insulin (prescribed), low-dose aspirin. Allergies: No known drug allergies. Social history: Denies tobacco or drug or alcohol use.  She lives with a partner who is African-American and he is in good health.  He is the father of her last 2 children. Family history: No history of  venous thromboembolism in the family.  Ultrasound We performed a fetal anatomical survey. Amniotic fluid is normal and good fetal activity is seen. Findings include: -Fetal biometry lags gestational age by 5 days. -All long bone measurements are short. Femur is small and there is slight bowing. -The skull, spine and long bones show normal mineralization. -Hands appear normal. No evidence of club feet. -Chest circumference measurement is within normal range ([email or web address]). -Fetal spine appears normal (transvers and coronal views). -Abdominal cord insertion appears normal. -Intracranial structures including cerebellum appear normal. -Fetal anatomical survey is very limited because of early gestational age and maternal body habitus. Other than short long bones, no obvious fetal structural defects are seen. -A small intramural myoma is seen (measurements above).  Our concerns include: Short long bones -Differential diagnoses include skeletal dysplasia and severe fetal growth restriction. Follow-up detailed fetal anatomical survey - I counseled her on early-onset fetal growth restriction and possible causes including uteroplacental insufficiency, chromosomal anomalies and genetic conditions, and infections. -Patient has co-morbid conditions of diabetes and hypertension that places the fetus at a higher risk of fetal growth restriction. -Skeletal dysplasia may be difficult to diagnose early in pregnancy and needs follow-up evaluations. I did not find any other skeletal abnormality other than short long bones and slight bowing of femurs. -I discussed amniocentesis that can give results on fetal karyotype and some genetic conditions. I discussed the procedure and possible complication of miscarriage (1 in 500 procedures). -Patient opted not to have amniocentesis.  Increased risk for open-neural tube defects and increased AFP I reassured the patient of normal fetal anatomy on ultrasound including  spine and intracranial structures.  However, not all fetal spine views are not completed today and we will evaluate at her next visit. I informed her that ultrasound has a greater detection rate for open-spina bifida. Increased AFP can also be associated with fetal growth restriction (placental insufficiency), preterm delivery and stillbirth (very rare). It can also follow vaginal bleeding.  Patient met with our genetic counselor after ultrasound. You will receive a separate letter from her.  History of pulmonary embolism (postpartum). Pregnancy increases the risk of VTE to about 4 to 5 times higher, but the absolute risk is very small. Patient has a history of VTE that was associated with pregnancy.  I informed her that given her history of pulmonary embolism that is estrogen-related, prophylactic anticoagulation is indicated. She has other risk factors including obesity.  Based on her risk factors and history, I recommend she takes intermediate dose of lovenox (40 mg twice daily). If patient is unable to take twice-daily injections, lovenox 80 mg to 100 mg once daily may be given.  Pregestational diabetes -I discussed the importance of good blood glucose control to prevent adverse fetal or neonatal outcomes.  Complications of poorly controlled diabetes include fetal macrosomia leading to birth injuries, stillbirth and neonatal complications including respiratory distress syndrome. -I discussed blood glucose parameters and the importance of diet and exercise in addition to insulin. I encouraged her to start taking insulin to prevent maternal and fetal complications.  -I discussed ultrasound protocol and monitoring fetal growth restriction every 4 weeks and starting weekly antenatal testing from [redacted] weeks gestation till delivery. -Hemoglobin A1C of 7.5% carries a higher risk of fetal congenital malformation (up to 4%). I recommended fetal echocardiography.  Chronic hypertension -Adverse outcomes of  severe chronic hypertension include maternal stroke, endorgan damage, coagulation disturbances and placental abruption.  Superimposed preeclampsia occurs in 30% of women with chronic hypertension.  I discussed the benefit of low-dose aspirin prophylaxis that helps delaying or preventing preeclampsia. -Labetalol can be safely given in pregnancy.  It can be associated with low birthweights.  Alternative medications include nifedipine. -I discussed ultrasound protocol of monitoring fetal growth assessment and antenatal testing.  Timing of delivery: As the patient has diabetes and hypertension, it is reasonable to consider delivery at 38 weeks' gestation. Early term delivery at 37 weeks may be considered if diabetes and/or hypertension is not well controlled. Inpatient management may be necessary.   Recommendations -An appointment was made for her to return in 4 weeks for completion of fetal anatomy. -We have requested fetal echocardiography ([facility]). -Fetal growth assessments every 4 weeks till delivery. -Weekly BPP from [redacted] weeks gestation till delivery. -Lovenox 40 mg twice daily.  If patient is unable to take twice daily injections, Lovenox 80 mg or 100 mg daily may be given.  Thank you for consultation.  If you have any questions or concerns, please contact me the [facility].  Consultation including face-to-face counseling 50 minutes.

## 2020-12-05 NOTE — Progress Notes (Signed)
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   EDC: 05/15/2021 LMP: 08/08/2020 Referring Office:   EGA: [redacted]w[redacted]d Genetic Counselor: Staci Righter, MS, [place]  OB Hx: [id number] Date of Appointment: 12/05/2020  Accompanied by: Virginia Fitzgerald Face to Face Time: 30 Minutes   Previous Testing Completed: Virginia Fitzgerald previously completed Non-Invasive Prenatal Screening (NIPS) in this pregnancy (scanned into Epic under the Media tab). The result is low risk. This screening significantly reduces the risk that the current pregnancy has Down syndrome, Trisomy 33, Trisomy 13, Monosomy X, and Triploidy, however, the risk is not zero given the limitations of NIPS. Additionally, there are many genetic conditions that cannot be detected by NIPS. Virginia Fitzgerald previously completed [facility] Carrier Screening in this pregnancy (scanned into Epic under the Media tab). She screened to not be a carrier for Cystic Fibrosis (CF), Spinal Muscular Atrophy (SMA), alpha thalassemia, and beta hemoglobinopathies. A negative result on carrier screening reduces the likelihood of being a carrier, however, does not entirely rule out the possibility.   Medical History:  This is Virginia Fitzgerald's 5th pregnancy. She has 3 living children. She has had one early loss. Personal history of type two diabetes, chronic hypertension, and a postpartum pulmonary embolism. Denies bleeding, infections, and fevers in this pregnancy. Denies using tobacco, alcohol, or street drugs in this pregnancy.   Family History: A pedigree was created and scanned into Epic under the Media tab. Maternal ethnicity reported as Research scientist (physical sciences) and Native American and paternal ethnicity reported as Research scientist (physical sciences), Native American, and Caucasian. Denies Ashkenazi Jewish ancestry. Family history not remarkable for consanguinity, individuals with birth defects, intellectual  disability, autism spectrum disorder, mental illness, multiple spontaneous abortions, still births, or unexplained neonatal death.     Genetic Counseling:   MSAFP indicating increased risk for an Open Neural Tube Defect (ONTD); risk is 1 in 28 after the screen. The elevation of the maternal serum AFP could indicate that the pregnancy has an ONTD which is a birth defect involving the incomplete development of the brain, spinal cord, or their protective coverings. Approximately 2-5% of ONTDs will be syndromic due to conditions such as chromosome abnormalities. When not associated with a particular condition, ONTDs are thought to have a multifactorial etiology. Nongenetic risk factors include reduced folate intake, maternal anticonvulsant medication, maternal diabetes mellitus, obesity, and hyperthermia. Today's limited anatomy ultrasound did not detect an ONTD, however, not all fetal spine views were completed and we will evaluate again at Virginia Fitzgerald next ultrasound on 01/02/21. Prenatal detection of ONTDs is largely influenced by the gestational age and type of neural tube defect. A second trimester ultrasound identifies virtually 100% of anencephaly cases. A second trimester ultrasound also routinely evaluates for spina bifida, and examination of the entire length of the spine in the sagittal, axial, and coronal planes in combination with a cranial evaluation identifies many cases: the detection rate is approximately 90-98%. If Virginia Fitzgerald desires, she can pursue an amniocentesis to evaluate the amniotic fluid AFP with reflex to acetylcholinesterase.  Shortened Long Bones. On today's limited anatomy ultrasound shortened long bones were visualized. The differential diagnosis includes severe fetal growth restriction and skeletal dysplasia. Possible causes of fetal growth restriction include uteroplacental insufficiency, chromosomal anomalies, genetic syndromes, and infections. Skeletal dysplasias may be difficult to  diagnose in early pregnancy and require follow-up evaluations. If Virginia Fitzgerald desires, she can pursue an amniocentesis to learn more information about the genetic health of the pregnancy.  Maternal Diabetes. Maternal diabetes is associated with an increased risk for birth defects of about 2-3 times the background risk of about 3%. These anomalies are variable, but most often include caudal regression, CNS defects such as open neural tube defects, renal, and cardiovascular defects. A majority of these defects cannot be detected through the diagnostic testing listed below. Ultrasound may detect some birth defects, but it may not detect all birth defects. About half of pregnancies with Down syndrome do not show any soft markers on ultrasound. A normal ultrasound does not guarantee a healthy pregnancy.   Testing/Screening Options:   Amniocentesis. This procedure is available for prenatal diagnosis. Possible procedural difficulties and complications that can arise include maternal infection, cramping, bleeding, fluid leakage, and/or pregnancy loss. The risk for pregnancy loss with an amniocentesis is 1/500. Per the [employer] (ACOG) Practice Bulletin 162, all pregnant women should be offered prenatal assessment for aneuploidy by diagnostic testing regardless of maternal age or other risk factors. If indicated, genetic testing that could be ordered on an amniocentesis sample includes a fetal karyotype, fetal microarray, and testing for specific syndromes.    Patient Plan:  Proceed with: Routine prenatal care Informed consent was obtained. All questions were answered.  Declined: Amniocentesis   Thank you for sharing in the care of Virginia Fitzgerald with [name].  Please do not hesitate to contact us if you have any questions.  Staci Righter, MS, [facility]

## 2020-12-07 ENCOUNTER — Telehealth: Payer: Self-pay | Admitting: Registered"

## 2020-12-07 NOTE — Telephone Encounter (Signed)
LVM for patient to contact me to set up Omnipod training

## 2020-12-08 ENCOUNTER — Encounter: Payer: Self-pay | Admitting: Obstetrics and Gynecology

## 2020-12-08 DIAGNOSIS — O365990 Maternal care for other known or suspected poor fetal growth, unspecified trimester, not applicable or unspecified: Secondary | ICD-10-CM | POA: Insufficient documentation

## 2020-12-08 DIAGNOSIS — O283 Abnormal ultrasonic finding on antenatal screening of mother: Secondary | ICD-10-CM | POA: Insufficient documentation

## 2020-12-09 ENCOUNTER — Ambulatory Visit (INDEPENDENT_AMBULATORY_CARE_PROVIDER_SITE_OTHER): Payer: Medicaid Other | Admitting: Obstetrics & Gynecology

## 2020-12-09 ENCOUNTER — Other Ambulatory Visit: Payer: Self-pay

## 2020-12-09 VITALS — BP 137/98 | HR 102 | Wt 287.0 lb

## 2020-12-09 DIAGNOSIS — E119 Type 2 diabetes mellitus without complications: Secondary | ICD-10-CM

## 2020-12-09 DIAGNOSIS — O10919 Unspecified pre-existing hypertension complicating pregnancy, unspecified trimester: Secondary | ICD-10-CM

## 2020-12-09 DIAGNOSIS — O0990 Supervision of high risk pregnancy, unspecified, unspecified trimester: Secondary | ICD-10-CM

## 2020-12-09 DIAGNOSIS — Z7901 Long term (current) use of anticoagulants: Secondary | ICD-10-CM

## 2020-12-09 DIAGNOSIS — Z6841 Body Mass Index (BMI) 40.0 and over, adult: Secondary | ICD-10-CM

## 2020-12-09 DIAGNOSIS — O283 Abnormal ultrasonic finding on antenatal screening of mother: Secondary | ICD-10-CM

## 2020-12-09 MED ORDER — BLOOD PRESSURE KIT DEVI: 1.0000 | 0 refills | Status: DC | PRN

## 2020-12-09 MED ORDER — ENOXAPARIN SODIUM 60 MG/0.6ML IJ SOSY: 80.0000 mg | PREFILLED_SYRINGE | INTRAMUSCULAR | 7 refills | Status: DC

## 2020-12-09 NOTE — Progress Notes (Signed)
PRENATAL VISIT NOTE  Subjective:  Virginia Fitzgerald is a 35 y.o. [id number] at 36w4dbeing seen today for ongoing prenatal care.  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 ([place]; History of COVID-19; Type 2 diabetes mellitus without complication, without long-term current use of insulin ([place]; History of recurrent deep vein thrombosis (DVT); Abnormal chest x-ray; Allergies; Supervision of high risk pregnancy, antepartum; Chronic hypertension affecting pregnancy; Pre-existing type 2 diabetes mellitus during pregnancy, antepartum; AMA (advanced maternal age) multigravida 35+, first trimester; Anticoagulation in pregnancy; Anemia in pregnancy; Rubella non-immune status, antepartum; Low fetal fraction on Panorama at 10 wks; Elevated AFP; S/P IVC filter; IUGR (intrauterine growth restriction) affecting care of mother; and Short fetal long bones on their problem list.  Patient reports heartburn.  Contractions: Not present. Vag. Bleeding: None.  Movement: Present. Denies leaking of fluid.   The following portions of the patient's history were reviewed and updated as appropriate: allergies, current medications, past family history, past medical history, past social history, past surgical history and problem list.   Objective:   Vitals:   12/09/20 1110  BP: (!) 137/98  Pulse: (!) 102  Weight: 287 lb (130.2 kg)    Fetal Status: Fetal Heart Rate (bpm): 162   Movement: Present     General:  Alert, oriented and cooperative. Patient is in no acute distress.  Skin: Skin is warm and dry. No rash noted.   Cardiovascular: Normal heart rate noted  Respiratory: Normal respiratory effort, no problems with respiration noted  Abdomen: Soft, gravid, appropriate for  gestational age.  Pain/Pressure: Absent     Pelvic: Cervical exam deferred        Extremities: Normal range of motion.  Edema: None  Mental Status: Normal mood and affect. Normal behavior. Normal judgment and thought content.   Assessment and Plan:  Pregnancy: [id number] 165w4d. Supervision of high risk pregnancy, antepartum Will increase Lovenox per MFM recommendation - Blood Pressure Monitoring (BLOOD PRESSURE KIT) DEVI; 1 Device by Does not apply route as needed. (Patient not taking: Reported on 12/09/2020)  Dispense: 1 each; Refill: 0 - enoxaparin (LOVENOX) 60 MG/0.6ML injection; Inject 0.8 mLs (80 mg total) into the skin daily.  Dispense: 30 mL; Refill: 7 - Ambulatory referral to Nutrition and Diabetic Education  2. Short fetal long bones F/u [place]. Chronic hypertension affecting pregnancy Urged compliance with medication  4. 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 BMI 50.0-59.9, adult (HCC) Body mass index is 50.84 kg/m.   6. Anticoagulation in pregnancy Meds ordered this encounter  Medications   Blood Pressure Monitoring (BLOOD PRESSURE KIT) DEVI    Sig: 1 Device by Does not apply route as needed.    Dispense:  1 each    Refill:  0    Dx: O09.90   enoxaparin (LOVENOX) 60 MG/0.6ML injection    Sig: Inject 0.8 mLs (80 mg total) into the skin daily.    Dispense:  30 mL    Refill:  7     Preterm labor symptoms and general obstetric precautions including but not limited to vaginal bleeding, contractions, leaking of fluid and fetal movement were reviewed in detail with the patient. Please refer to After Visit Summary for other counseling recommendations.   Return in about  3 weeks (around 12/30/2020).  Future Appointments  Date Time Provider Josephine  01/02/2021  9:30 AM [facility] NURSE [facility]  01/02/2021  9:45 AM [facility] US6 [facility]  01/13/2021  2:20 PM Tobb, Godfrey Pick, DO [facility] None    Emeterio Reeve, MD

## 2020-12-09 NOTE — Progress Notes (Signed)
Patient stated that she needs to "new order" for blood pressure kit device. Order was placed and patient was informed to pick up device at [place]. Patient verbalized understanding.   Virginia Fitzgerald has no further questions or concerns

## 2020-12-17 ENCOUNTER — Encounter (HOSPITAL_COMMUNITY): Payer: Self-pay | Admitting: Family Medicine

## 2020-12-17 ENCOUNTER — Inpatient Hospital Stay (HOSPITAL_COMMUNITY)
Admission: AD | Admit: 2020-12-17 | Discharge: 2020-12-17 | Disposition: A | Discharge status: Home / Self Care | Payer: Medicaid Other | Attending: Family Medicine | Admitting: Family Medicine

## 2020-12-17 ENCOUNTER — Other Ambulatory Visit: Payer: Self-pay

## 2020-12-17 DIAGNOSIS — O219 Vomiting of pregnancy, unspecified: Secondary | ICD-10-CM | POA: Diagnosis present

## 2020-12-17 DIAGNOSIS — Z3A18 18 weeks gestation of pregnancy: Secondary | ICD-10-CM | POA: Insufficient documentation

## 2020-12-17 DIAGNOSIS — R109 Unspecified abdominal pain: Secondary | ICD-10-CM | POA: Diagnosis not present

## 2020-12-17 DIAGNOSIS — R051 Acute cough: Secondary | ICD-10-CM

## 2020-12-17 LAB — URINALYSIS, ROUTINE W REFLEX MICROSCOPIC
Bacteria, UA: NONE SEEN
Bilirubin Urine: NEGATIVE
Glucose, UA: NEGATIVE mg/dL
Hgb urine dipstick: NEGATIVE
Ketones, ur: NEGATIVE mg/dL
Nitrite: NEGATIVE
Protein, ur: NEGATIVE mg/dL
Specific Gravity, Urine: 1.005 (ref 1.005–1.030)
pH: 7 (ref 5.0–8.0)

## 2020-12-17 MED ORDER — FAMOTIDINE 20 MG PO TABS
20.0000 mg | ORAL_TABLET | Freq: Once | ORAL | Status: AC
  Administered 2020-12-17: 20 mg via ORAL
  Filled 2020-12-17: qty 1

## 2020-12-17 MED ORDER — CYCLOBENZAPRINE HCL 5 MG PO TABS
10.0000 mg | ORAL_TABLET | Freq: Once | ORAL | Status: AC
  Administered 2020-12-17: 10 mg via ORAL
  Filled 2020-12-17: qty 2

## 2020-12-17 NOTE — MAU Note (Signed)
..  Virginia Fitzgerald is a 35 y.o. at [redacted]w[redacted]d here in MAU reporting: 30 minutes ago she was coughing uncontrollably and it caused her to vomit that was red. This has only happened once. Left sided back pain, that is constant and feels aching/burning and it wakes her up from sleeping this pain began a week ago.  Denies abdominal pain or vaginal bleeding.  Pain score: 6/10  Vitals:   12/17/20 1955  BP: 126/87  Pulse: (!) 108  Resp: 16  Temp: 98.5 F (36.9 C)  SpO2: 100%     FHT:156

## 2020-12-17 NOTE — MAU Provider Note (Addendum)
History     CSN: [id number]  Arrival date and time: 12/17/20 1931   Event Date/Time   First Provider Initiated Contact with Patient 12/17/20 2037      Chief Complaint  Patient presents with   Cough   HPI Virginia Fitzgerald is a 35 y.o. [id number] at 68w5dwho presents stating at 1900 she got choked up on her spit and coughed until she vomited. She states when she vomited, there was red in it and she was worried about bleeding. She states she has no other cold/flu symptoms and states this happens sometimes, usually accompanied with burning. She also reports left sided pain that hurts when she lays on it. She points to her ribs and under her left breast. She denies any lower abdominal pain, vaginal bleeding or discharge. She has tried tylenol for the pain without relief.   OB History     Gravida  5   Para  3   Term  3   Preterm  0   AB  1   Living  3      SAB  1   IAB  0   Ectopic  0   Multiple      Live Births  3        Obstetric Comments  Largest prior was 7lbs 7oz. No issues with deliveries.          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 ([place] 03/28/2017   A1C 6.5    Fatty liver    GDM (gestational diabetes mellitus)    WITH 2019 PREGNANCY ONLY   GERD (gastroesophageal reflux disease)    diet controlled - no meds   Headache    last one 05/25/17 - otc med   [facility] blood transfusion 2012   [facility]  History of DVT of lower extremity    History of hiatal hernia    Iron deficiency anemia 109/47/0962  Metallic taste 183/66/2947  MRSA infection    not in EPIC - per patient in 2011   Pulmonary embolism ([facility]    S/P IVC filter    Thrombocytosis     Past Surgical History:  Procedure Laterality Date   DILATION AND EVACUATION N/A 05/29/2017   Procedure: DILATATION AND EVACUATION;  Surgeon: HLavonia Drafts MD;  [facility];  Service:  Gynecology;  Laterality: N/A;   WISDOM TOOTH EXTRACTION      Family History  Problem Relation Age of Onset   Diabetes Mother    Hypertension Mother    Hypertension Maternal Uncle    Diabetes Maternal Uncle    Hypertension Maternal Grandmother     Social History   Tobacco Use   Smoking status: Never   Smokeless tobacco: Never   Tobacco comments:    never used tobacco  Vaping Use   Vaping Use: Never used  Substance Use Topics   Alcohol use: No    Alcohol/week: 0.0 standard drinks   Drug use: No    Allergies: No Known Allergies  Medications Prior to Admission  Medication Sig Dispense Refill Last Dose   aspirin EC 81 MG tablet Take 1 tablet (81 mg total) by mouth daily. Swallow whole. 30 tablet 11 12/17/2020   enoxaparin (LOVENOX) 60 MG/0.6ML injection Inject 0.8 mLs (80 mg total) into the skin daily. 30 mL 7 12/17/2020   ferrous gluconate (FERGON) 324 MG  tablet Take 1 tablet (324 mg total) by mouth every other day. 60 tablet 1 12/17/2020   labetalol (NORMODYNE) 200 MG tablet Take 1 tablet (200 mg total) by mouth 2 (two) times daily. 60 tablet 3 12/17/2020   Prenatal Vit-Fe Fumarate-FA (MULTIVITAMIN-PRENATAL) 27-0.8 MG TABS tablet Take 1 tablet by mouth daily at 12 noon.   12/17/2020   ACCU-CHEK FASTCLIX LANCETS MISC 1 Device by Percutaneous route 4 (four) times daily. 100 each 12    acetaminophen (TYLENOL) 500 MG tablet Take 1,000 mg by mouth every 8 (eight) hours as needed for moderate pain.      blood glucose meter kit and supplies Dispense based on patient and insurance preference. Use up to four times daily as directed. (FOR ICD-10 E10.9, E11.9). (Patient not taking: Reported on 12/09/2020) 1 each 0    Blood Glucose Monitoring Suppl (ACCU-CHEK GUIDE) w/Device KIT 1 Device by Does not apply route 4 (four) times daily. (Patient not taking: Reported on 12/09/2020) 1 kit 0    Blood Pressure Monitoring (BLOOD PRESSURE KIT) DEVI 1 Device by Does not apply route as needed. (Patient  not taking: Reported on 12/09/2020) 1 each 0    cetirizine (ZYRTEC ALLERGY) 10 MG tablet Take 1 tablet (10 mg total) by mouth daily. (Patient not taking: No sig reported) 14 tablet 0    clindamycin (CLINDAGEL) 1 % gel Apply topically 2 (two) times daily. (Patient not taking: No sig reported) 30 g 11    Continuous Blood Gluc Receiver (DEXCOM G6 RECEIVER) DEVI For use with Dexcom continuous Glucose Monitor. (Patient not taking: No sig reported) 1 each 0    Continuous Blood Gluc Transmit (DEXCOM G6 TRANSMITTER) MISC 1 Device by Does not apply route every 3 (three) months. (Patient not taking: No sig reported) 3 each 1    COVID-19 Specimen Collection KIT TEST AS DIRECTED TODAY (Patient not taking: Reported on 12/05/2020)      famotidine (PEPCID) 20 MG tablet Take 1 tablet (20 mg total) by mouth daily. (Patient not taking: No sig reported) 30 tablet 11    glucose blood (ACCU-CHEK GUIDE) test strip Use as instructed QID 100 each 12    insulin detemir (LEVEMIR) 100 UNIT/ML FlexPen Inject 20 Units into the skin daily. (Patient not taking: No sig reported) 15 mL 11    Insulin Disposable Pump (OMNIPOD DASH INTRO, GEN 4,) KIT 105 Units by Does not apply route every other day. (Patient not taking: No sig reported) 1 kit 0    Insulin Disposable Pump (OMNIPOD DASH PODS, GEN 4,) MISC Inject 1 Device into the skin every other day. (Patient not taking: No sig reported) 3 each 7    insulin lispro (HUMALOG) 100 UNIT/ML injection Inject 1.05 mLs (105 Units total) into the skin daily. For use with omnipod (Patient not taking: No sig reported) 30 mL 11    Misc. Devices (GOJJI WEIGHT SCALE) MISC 1 Device by Does not apply route as needed. (Patient not taking: No sig reported) 1 each 0    polyethylene glycol (MIRALAX) 17 g packet Take 17 g by mouth daily. (Patient not taking: Reported on 12/09/2020) 14 each 0    simethicone (GAS-X) 80 MG chewable tablet Chew 1 tablet (80 mg total) by mouth every 6 (six) hours as needed for  flatulence. (Patient not taking: No sig reported) 30 tablet 0    ULTICARE INSULIN SYRINGE 30G X 1/2" 0.3 ML MISC See admin instructions. with insulin (Patient not taking: No sig reported)  Review of Systems  Constitutional: Negative.  Negative for fatigue and fever.  HENT: Negative.    Respiratory:  Positive for cough. Negative for shortness of breath.   Cardiovascular: Negative.  Negative for chest pain.  Gastrointestinal:  Positive for vomiting. Negative for abdominal pain, constipation, diarrhea and nausea.  Genitourinary: Negative.  Negative for dysuria, vaginal bleeding and vaginal discharge.  Neurological: Negative.  Negative for dizziness and headaches.  Physical Exam   Blood pressure 126/87, pulse (!) 108, temperature 98.5 F (36.9 C), temperature source Oral, resp. rate 16, height '5\' 3"'  (1.6 m), weight 133.5 kg, last menstrual period 08/08/2020, SpO2 100 %.  Physical Exam Vitals and nursing note reviewed.  Constitutional:      General: She is not in acute distress.    Appearance: She is well-developed.  HENT:     Head: Normocephalic.  Eyes:     Pupils: Pupils are equal, round, and reactive to light.  Cardiovascular:     Rate and Rhythm: Normal rate and regular rhythm.     Heart sounds: Normal heart sounds.  Pulmonary:     Effort: Pulmonary effort is normal. No respiratory distress.     Breath sounds: Normal breath sounds.  Abdominal:     General: Bowel sounds are normal. There is no distension.     Palpations: Abdomen is soft.     Tenderness: There is no abdominal tenderness.  Skin:    General: Skin is warm and dry.  Neurological:     Mental Status: She is alert and oriented to person, place, and time.  Psychiatric:        Mood and Affect: Mood normal.        Behavior: Behavior normal.        Thought Content: Thought content normal.        Judgment: Judgment normal.   FHT: 156 bpm  MAU Course  Procedures Results for orders placed or performed during the  hospital encounter of 12/17/20 (from the past 24 hour(s))  Urinalysis, Routine w reflex microscopic Urine, Clean Catch     Status: Abnormal   Collection Time: 12/17/20  8:10 PM  Result Value Ref Range   Color, Urine STRAW (A) YELLOW   APPearance HAZY (A) CLEAR   Specific Gravity, Urine 1.005 1.005 - 1.030   pH 7.0 5.0 - 8.0   Glucose, UA NEGATIVE NEGATIVE mg/dL   Hgb urine dipstick NEGATIVE NEGATIVE   Bilirubin Urine NEGATIVE NEGATIVE   Ketones, ur NEGATIVE NEGATIVE mg/dL   Protein, ur NEGATIVE NEGATIVE mg/dL   Nitrite NEGATIVE NEGATIVE   Leukocytes,Ua SMALL (A) NEGATIVE   RBC / HPF 0-5 0 - 5 RBC/hpf   WBC, UA 0-5 0 - 5 WBC/hpf   Bacteria, UA NONE SEEN NONE SEEN   Squamous Epithelial / LPF 6-10 0 - 5    MDM UA Flexeril PO Pepcid PO  Reassurance provided and warning signs for bloody emesis reviewed.  Care turned over to Virginia Fitzgerald CNM at 2100.  Virginia Fitzgerald, CNM 12/17/20 8:59 PM   Assessment and Plan   Reassessment (10:05 PM)  -Nurse reports patient having relief with flexeril dosing. -Nurse instructed to give precautions. -Discharged to home in stable condition.  Virginia Conners MSN, CNM Advanced Practice Provider, [facility]

## 2020-12-19 ENCOUNTER — Other Ambulatory Visit: Payer: Medicaid Other

## 2020-12-19 ENCOUNTER — Ambulatory Visit: Payer: Medicaid Other

## 2020-12-21 ENCOUNTER — Encounter: Payer: Medicaid Other | Admitting: Physician Assistant

## 2020-12-21 NOTE — Progress Notes (Signed)
Another patient needed appt, not Virginia Fitzgerald. Advised must be scheduled under patient name.

## 2020-12-21 NOTE — Patient Instructions (Signed)
For the accuracy of medical information and documentation you must be the actual patient logged in to their own MyChart account.  If you are not the patient, please have the patient log in and complete the Visit.   If you go to [email or web address] website, scroll down to where it says "view all Virtual options" and select this. Underneath will pop up to schedule a video visit. It will come to a page for you to log in with your MyChart or continue as guest. You may select "continue as guest" and proceed with scheduling.   We do apologize for this inconvenience.   Grace Bushy, PA-C

## 2020-12-29 ENCOUNTER — Telehealth: Payer: Self-pay | Admitting: Registered"

## 2020-12-29 NOTE — Telephone Encounter (Signed)
Patient has Omnipod training set up on 12/6. Msg sent through MyChart to get information needed when starting her pump.

## 2021-01-02 ENCOUNTER — Encounter: Payer: Self-pay | Admitting: *Deleted

## 2021-01-02 ENCOUNTER — Other Ambulatory Visit: Payer: Self-pay | Admitting: *Deleted

## 2021-01-02 ENCOUNTER — Other Ambulatory Visit: Payer: Self-pay | Admitting: Obstetrics and Gynecology

## 2021-01-02 ENCOUNTER — Ambulatory Visit (INDEPENDENT_AMBULATORY_CARE_PROVIDER_SITE_OTHER): Payer: Medicaid Other | Admitting: Obstetrics & Gynecology

## 2021-01-02 ENCOUNTER — Ambulatory Visit: Payer: Medicaid Other | Attending: Obstetrics and Gynecology

## 2021-01-02 ENCOUNTER — Other Ambulatory Visit: Payer: Self-pay

## 2021-01-02 ENCOUNTER — Ambulatory Visit: Payer: Medicaid Other | Admitting: *Deleted

## 2021-01-02 ENCOUNTER — Ambulatory Visit (HOSPITAL_BASED_OUTPATIENT_CLINIC_OR_DEPARTMENT_OTHER): Payer: Medicaid Other | Admitting: Maternal & Fetal Medicine

## 2021-01-02 VITALS — BP 141/95 | HR 118

## 2021-01-02 VITALS — BP 152/106 | HR 117 | Wt 291.8 lb

## 2021-01-02 DIAGNOSIS — E119 Type 2 diabetes mellitus without complications: Secondary | ICD-10-CM

## 2021-01-02 DIAGNOSIS — O09521 Supervision of elderly multigravida, first trimester: Secondary | ICD-10-CM

## 2021-01-02 DIAGNOSIS — Q789 Osteochondrodysplasia, unspecified: Secondary | ICD-10-CM | POA: Diagnosis present

## 2021-01-02 DIAGNOSIS — O35HXX0 Maternal care for other (suspected) fetal abnormality and damage, fetal lower extremities anomalies, not applicable or unspecified: Secondary | ICD-10-CM

## 2021-01-02 DIAGNOSIS — Z3A21 21 weeks gestation of pregnancy: Secondary | ICD-10-CM

## 2021-01-02 DIAGNOSIS — I2699 Other pulmonary embolism without acute cor pulmonale: Secondary | ICD-10-CM

## 2021-01-02 DIAGNOSIS — O24112 Pre-existing diabetes mellitus, type 2, in pregnancy, second trimester: Secondary | ICD-10-CM | POA: Diagnosis present

## 2021-01-02 DIAGNOSIS — O0990 Supervision of high risk pregnancy, unspecified, unspecified trimester: Secondary | ICD-10-CM | POA: Diagnosis present

## 2021-01-02 DIAGNOSIS — O10012 Pre-existing essential hypertension complicating pregnancy, second trimester: Secondary | ICD-10-CM

## 2021-01-02 DIAGNOSIS — Z86711 Personal history of pulmonary embolism: Secondary | ICD-10-CM

## 2021-01-02 DIAGNOSIS — O10912 Unspecified pre-existing hypertension complicating pregnancy, second trimester: Secondary | ICD-10-CM

## 2021-01-02 DIAGNOSIS — R772 Abnormality of alphafetoprotein: Secondary | ICD-10-CM

## 2021-01-02 DIAGNOSIS — O10919 Unspecified pre-existing hypertension complicating pregnancy, unspecified trimester: Secondary | ICD-10-CM

## 2021-01-02 NOTE — Progress Notes (Signed)
   PRENATAL VISIT NOTE  Subjective:  Virginia Fitzgerald is a 35 y.o. N8M7672 at [redacted]w[redacted]d being seen today for ongoing prenatal care.  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 ([place]); History of COVID-19; Type 2 diabetes mellitus without complication, without long-term current use of insulin ([place]); History of recurrent deep vein thrombosis (DVT); Abnormal chest x-ray; Allergies; Supervision of high risk pregnancy, antepartum; Chronic hypertension affecting pregnancy; Pre-existing type 2 diabetes mellitus during pregnancy, antepartum; AMA (advanced maternal age) multigravida 35+, first trimester; Anticoagulation in pregnancy; Anemia in pregnancy; Rubella non-immune status, antepartum; Low fetal fraction on Panorama at 10 wks; Elevated AFP; S/P IVC filter; IUGR (intrauterine growth restriction) affecting care of mother; and Short fetal long bones on their problem list.  Patient reports no complaints.  Contractions: Not present. Vag. Bleeding: None.  Movement: Present. Denies leaking of fluid.   The following portions of the patient's history were reviewed and updated as appropriate: allergies, current medications, past family history, past medical history, past social history, past surgical history and problem list.   Objective:   Vitals:   01/02/21 1140  BP: (!) 152/106  Pulse: (!) 117  Weight: 291 lb 12.8 oz (132.4 kg)    Fetal Status:     Movement: Present     General:  Alert, oriented and cooperative. Patient is in no acute distress.  Skin: Skin is warm and dry. No rash noted.   Cardiovascular: Normal heart rate noted  Respiratory: Normal respiratory effort, no problems with respiration noted  Abdomen: Soft, gravid, appropriate for gestational age.   Pain/Pressure: Absent     Pelvic: Cervical exam deferred        Extremities: Normal range of motion.     Mental Status: Normal mood and affect. Normal behavior. Normal judgment and thought content.   Assessment and Plan:  Pregnancy: [id number] at [redacted]w[redacted]d 1. Supervision of high risk pregnancy, antepartum F/u US today  2. Recurrent pulmonary embolism (HCC) On Lovenox  3. Chronic hypertension affecting pregnancy Missed her labetalol this am  4. AMA (advanced maternal age) multigravida 88+, first trimester Will start using Omnipod tomorrow after training  Preterm labor symptoms and general obstetric precautions including but not limited to vaginal bleeding, contractions, leaking of fluid and fetal movement were reviewed in detail with the patient. Please refer to After Visit Summary for other counseling recommendations.   Return in about 2 weeks (around 01/16/2021).  Future Appointments  Date Time Provider [place]  01/03/2021  2:15 PM [facility]  01/13/2021  2:20 PM Berniece Salines, DO [facility] None  02/01/2021 11:00 AM [facility] NURSE [facility]  02/01/2021 11:15 AM [facility] US2 [facility]    Emeterio Reeve, MD

## 2021-01-02 NOTE — Progress Notes (Addendum)
MFM Brief Follow up Note  [name] is a 35 yo G5P3 at 35w0dwho is seen in follow up at the request of Dr. 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  Ms. HBufordis here for a follow up growth and evaluation for FGR vs skeletal dysplasia and known elevated AFP 3.59 MoM.  Vitals with BMI 01/02/2021 01/02/2021 12/17/2020  Height - - '5\' 3"'   Weight 291 lbs 13 oz - 294 lbs 6 oz  BMI 588.7- 537.30 Systolic [id number] Diastolic 158295 87  Pulse 117 118 108     Today we again observed FGR at <1% with normal UA Dopplers. The UA Dopplers were at the 95th% today without evidence of AEDF or REDF.  The femurs were further bowed bilaterally. The FL/AC 0.15 and a FL/FT ratio is < 1 at 0.65. The calvarium and fetal long bones appeared normally ossified. There were fractures. I chest did not appear bell shaped and there was no frontal bossing.   Suboptimal views of the anatomy (esp fetal heart, spine) were again seen secondary to maternal habitus. She is scheduled for a fetal echocardiogram on 12/8.  Ms. HManzerwas previously counseled and offered an amniocentesis. She declined at that time and she again declines today.  I discussed the option of a blood test to assess for the FGFR gene, she met with our genetic counselor and she was provided information to possible subsidize some if not all of the cost, as it was cost prohibitive for her.   We discussed the possible concern for a lethal skeletal dysplasia diagnosis given the FL/AC of 0.15.   Regarding her T2DM- she received her omnipod and has teaching tomorrow.   She is taking her labetalol for management of her chronic hypertension.  Her blood pressure appears normal is stable. Her blood pressure was taken after our visit which was elevated. She is asymptomatic. She will recheck it tomorrow with her provider. She does note that she has an occasional headache that resolves with tylenol.  I recommend repeat growth in 4  weeks.  All questions answered  I spent 20 minutes with > 50% in face to face consultation.  Virginia Stan J. Korvin Valentine,MD

## 2021-01-03 ENCOUNTER — Other Ambulatory Visit: Payer: Self-pay

## 2021-01-03 ENCOUNTER — Ambulatory Visit: Payer: Medicaid Other | Admitting: Registered"

## 2021-01-03 ENCOUNTER — Encounter: Payer: Medicaid Other | Attending: Obstetrics and Gynecology | Admitting: Registered"

## 2021-01-03 DIAGNOSIS — Z2839 Other underimmunization status: Secondary | ICD-10-CM | POA: Insufficient documentation

## 2021-01-03 DIAGNOSIS — B6989 Cysticercosis of other sites: Secondary | ICD-10-CM | POA: Insufficient documentation

## 2021-01-03 DIAGNOSIS — Z3A00 Weeks of gestation of pregnancy not specified: Secondary | ICD-10-CM | POA: Diagnosis not present

## 2021-01-03 DIAGNOSIS — O0991 Supervision of high risk pregnancy, unspecified, first trimester: Secondary | ICD-10-CM | POA: Insufficient documentation

## 2021-01-03 DIAGNOSIS — N760 Acute vaginitis: Secondary | ICD-10-CM | POA: Insufficient documentation

## 2021-01-03 DIAGNOSIS — O10911 Unspecified pre-existing hypertension complicating pregnancy, first trimester: Secondary | ICD-10-CM | POA: Diagnosis not present

## 2021-01-03 DIAGNOSIS — O99211 Obesity complicating pregnancy, first trimester: Secondary | ICD-10-CM | POA: Insufficient documentation

## 2021-01-03 DIAGNOSIS — I2699 Other pulmonary embolism without acute cor pulmonale: Secondary | ICD-10-CM | POA: Diagnosis not present

## 2021-01-03 DIAGNOSIS — Z7901 Long term (current) use of anticoagulants: Secondary | ICD-10-CM | POA: Diagnosis not present

## 2021-01-03 DIAGNOSIS — O09521 Supervision of elderly multigravida, first trimester: Secondary | ICD-10-CM | POA: Diagnosis not present

## 2021-01-03 DIAGNOSIS — E118 Type 2 diabetes mellitus with unspecified complications: Secondary | ICD-10-CM | POA: Insufficient documentation

## 2021-01-03 DIAGNOSIS — E669 Obesity, unspecified: Secondary | ICD-10-CM | POA: Diagnosis not present

## 2021-01-03 DIAGNOSIS — R4589 Other symptoms and signs involving emotional state: Secondary | ICD-10-CM | POA: Diagnosis not present

## 2021-01-03 DIAGNOSIS — O24111 Pre-existing diabetes mellitus, type 2, in pregnancy, first trimester: Secondary | ICD-10-CM | POA: Insufficient documentation

## 2021-01-03 DIAGNOSIS — O24119 Pre-existing diabetes mellitus, type 2, in pregnancy, unspecified trimester: Secondary | ICD-10-CM

## 2021-01-03 MED ORDER — ENOXAPARIN SODIUM 80 MG/0.8ML IJ SOSY: 80.0000 mg | PREFILLED_SYRINGE | INTRAMUSCULAR | 0 refills | Status: DC

## 2021-01-03 NOTE — Telephone Encounter (Signed)
[facility] called with discrepancy with Rx Lovenox. Pt changed to 80mg  from 60mg  and Rx was changed and resent to [facility] to reflect change in therapy.   Colletta Maryland, RN

## 2021-01-04 ENCOUNTER — Other Ambulatory Visit: Payer: Self-pay

## 2021-01-05 ENCOUNTER — Other Ambulatory Visit: Payer: Self-pay

## 2021-01-05 ENCOUNTER — Encounter (HOSPITAL_COMMUNITY): Payer: Self-pay | Admitting: Obstetrics & Gynecology

## 2021-01-05 ENCOUNTER — Inpatient Hospital Stay (HOSPITAL_COMMUNITY)
Admission: AD | Admit: 2021-01-05 | Discharge: 2021-01-06 | Disposition: A | Discharge status: Home / Self Care | Payer: Medicaid Other | Attending: Obstetrics & Gynecology | Admitting: Obstetrics & Gynecology

## 2021-01-05 DIAGNOSIS — O09522 Supervision of elderly multigravida, second trimester: Secondary | ICD-10-CM | POA: Insufficient documentation

## 2021-01-05 DIAGNOSIS — M545 Low back pain, unspecified: Secondary | ICD-10-CM | POA: Insufficient documentation

## 2021-01-05 DIAGNOSIS — O26892 Other specified pregnancy related conditions, second trimester: Secondary | ICD-10-CM | POA: Insufficient documentation

## 2021-01-05 DIAGNOSIS — R82998 Other abnormal findings in urine: Secondary | ICD-10-CM

## 2021-01-05 DIAGNOSIS — Z3A21 21 weeks gestation of pregnancy: Secondary | ICD-10-CM | POA: Diagnosis not present

## 2021-01-05 DIAGNOSIS — M62838 Other muscle spasm: Secondary | ICD-10-CM | POA: Insufficient documentation

## 2021-01-05 DIAGNOSIS — R109 Unspecified abdominal pain: Secondary | ICD-10-CM

## 2021-01-05 NOTE — MAU Note (Signed)
Pt reports left lower back pain since yesterday.   Denies vaginal bleeding or LOF.

## 2021-01-06 ENCOUNTER — Ambulatory Visit: Payer: Medicaid Other | Admitting: Registered"

## 2021-01-06 ENCOUNTER — Inpatient Hospital Stay (HOSPITAL_COMMUNITY): Payer: Medicaid Other

## 2021-01-06 ENCOUNTER — Encounter: Payer: Medicaid Other | Admitting: Registered"

## 2021-01-06 DIAGNOSIS — R109 Unspecified abdominal pain: Secondary | ICD-10-CM

## 2021-01-06 DIAGNOSIS — Z3A21 21 weeks gestation of pregnancy: Secondary | ICD-10-CM

## 2021-01-06 DIAGNOSIS — O24111 Pre-existing diabetes mellitus, type 2, in pregnancy, first trimester: Secondary | ICD-10-CM | POA: Diagnosis not present

## 2021-01-06 DIAGNOSIS — O26892 Other specified pregnancy related conditions, second trimester: Secondary | ICD-10-CM

## 2021-01-06 DIAGNOSIS — O24119 Pre-existing diabetes mellitus, type 2, in pregnancy, unspecified trimester: Secondary | ICD-10-CM

## 2021-01-06 DIAGNOSIS — R82998 Other abnormal findings in urine: Secondary | ICD-10-CM

## 2021-01-06 DIAGNOSIS — M62838 Other muscle spasm: Secondary | ICD-10-CM

## 2021-01-06 LAB — URINALYSIS, MICROSCOPIC (REFLEX)

## 2021-01-06 LAB — URINALYSIS, ROUTINE W REFLEX MICROSCOPIC
Bilirubin Urine: NEGATIVE
Glucose, UA: NEGATIVE mg/dL
Hgb urine dipstick: NEGATIVE
Ketones, ur: NEGATIVE mg/dL
Nitrite: NEGATIVE
Protein, ur: 30 mg/dL — AB
Specific Gravity, Urine: 1.025 (ref 1.005–1.030)
pH: 6 (ref 5.0–8.0)

## 2021-01-06 MED ORDER — CYCLOBENZAPRINE HCL 5 MG PO TABS
5.0000 mg | ORAL_TABLET | Freq: Three times a day (TID) | ORAL | 0 refills | Status: DC | PRN
Start: 2021-01-06 — End: 2021-02-03

## 2021-01-06 MED ORDER — CEPHALEXIN 500 MG PO CAPS: 500.0000 mg | ORAL_CAPSULE | Freq: Four times a day (QID) | ORAL | 2 refills | Status: DC

## 2021-01-06 MED ORDER — CYCLOBENZAPRINE HCL 5 MG PO TABS
5.0000 mg | ORAL_TABLET | Freq: Once | ORAL | Status: AC
  Administered 2021-01-06: 5 mg via ORAL
  Filled 2021-01-06: qty 1

## 2021-01-06 NOTE — MAU Note (Signed)
Informed Hansel Feinstein, CNM of last blood pressure value before d/c pt home. CNM expressed no concern due to pt's history of chronic hypertension. Educated pt to take blood pressure medication this evening when she arrives home. Pt verbalized understanding.

## 2021-01-06 NOTE — MAU Provider Note (Signed)
Chief Complaint:  Back Pain   Event Date/Time   First Provider Initiated Contact with Patient 01/06/21 0027     HPI: Virginia Fitzgerald is a 35 y.o. G5P3013 at 67w4dho presents to maternity admissions reporting pain in left lower back since yesterday. Denies dysuria or fever. No bleeding. . She reports good fetal movement, denies LOF, vaginal bleeding, vaginal itching/burning,  h/a, dizziness, n/v, diarrhea, constipation   Back Pain This is a new problem. The current episode started yesterday. The problem occurs constantly. The problem is unchanged. The pain is present in the lumbar spine. The quality of the pain is described as aching and cramping. The pain does not radiate. Stiffness is present All day. Pertinent negatives include no abdominal pain, dysuria, fever, headaches, paresis, paresthesias or pelvic pain. She has tried nothing for the symptoms.    Past Medical History: 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 ([facility] 03/28/2017   A1C 6.5    Fatty liver    GDM (gestational diabetes mellitus)    WITH 2019 PREGNANCY ONLY   GERD (gastroesophageal reflux disease)    diet controlled - no meds   Headache    last one 05/25/17 - otc med   [facility] 2012   [facility]  History of DVT of lower extremity    History of hiatal hernia    Iron deficiency anemia 138/93/7342  Metallic taste 187/68/1157  MRSA infection    not in EPIC - per patient in 2011   Pulmonary embolism ([facility]    S/P IVC filter    Thrombocytosis     Past obstetric history: OB History  Gravida Para Term Preterm AB Living  '5 3 3 ' 0 1 3  SAB IAB Ectopic Multiple Live Births  1 0 0   3    # Outcome Date GA Lbr Len/2nd Weight Sex Delivery Anes PTL Lv  5 Current           4 SAB 05/29/17 [id number]       3 Term 03/20/09    M Vag-Spont   LIV  2 Term 04/07/08    M Vag-Spont   LIV  1 Term 06/28/02    F Vag-Spont    LIV    Obstetric Comments  Largest prior was 7lbs 7oz. No issues with deliveries.     Past Surgical History: Past Surgical History:  Procedure Laterality Date   DILATION AND EVACUATION N/A 05/29/2017   Procedure: DILATATION AND EVACUATION;  Surgeon: HaLavonia DraftsMD;  [facility];  Service: Gynecology;  Laterality: N/A;   WISDOM TOOTH EXTRACTION      Family History: Family History  Problem Relation Age of Onset   Diabetes Mother    Hypertension Mother    Hypertension Maternal Uncle    Diabetes Maternal Uncle    Hypertension Maternal Grandmother     Social History: Social History   Tobacco Use   Smoking status: Never   Smokeless tobacco: Never   Tobacco comments:    never used tobacco  Vaping Use   Vaping Use: Never used  Substance Use Topics   Alcohol use: No    Alcohol/week: 0.0 standard drinks   Drug use: No    Allergies: No Known Allergies  Meds:  Medications Prior to Admission  Medication Sig Dispense Refill Last Dose   aspirin EC 81 MG tablet  Take 1 tablet (81 mg total) by mouth daily. Swallow whole. 30 tablet 11 01/05/2021   labetalol (NORMODYNE) 200 MG tablet Take 1 tablet (200 mg total) by mouth 2 (two) times daily. 60 tablet 3 01/05/2021   ACCU-CHEK FASTCLIX LANCETS MISC 1 Device by Percutaneous route 4 (four) times daily. 100 each 12    acetaminophen (TYLENOL) 500 MG tablet Take 1,000 mg by mouth every 8 (eight) hours as needed for moderate pain.      blood glucose meter kit and supplies Dispense based on patient and insurance preference. Use up to four times daily as directed. (FOR ICD-10 E10.9, E11.9). (Patient not taking: Reported on 12/09/2020) 1 each 0    Blood Glucose Monitoring Suppl (ACCU-CHEK GUIDE) w/Device KIT 1 Device by Does not apply route 4 (four) times daily. (Patient not taking: Reported on 12/09/2020) 1 kit 0    Blood Pressure Monitoring (BLOOD PRESSURE KIT) DEVI 1 Device by Does not apply route as needed. (Patient not  taking: Reported on 12/09/2020) 1 each 0    cetirizine (ZYRTEC ALLERGY) 10 MG tablet Take 1 tablet (10 mg total) by mouth daily. (Patient not taking: Reported on 11/15/2020) 14 tablet 0    Continuous Blood Gluc Receiver (DEXCOM G6 RECEIVER) DEVI For use with Dexcom continuous Glucose Monitor. (Patient not taking: Reported on 12/05/2020) 1 each 0    Continuous Blood Gluc Transmit (DEXCOM G6 TRANSMITTER) MISC 1 Device by Does not apply route every 3 (three) months. (Patient not taking: Reported on 12/05/2020) 3 each 1    enoxaparin (LOVENOX) 80 MG/0.8ML injection Inject 0.8 mLs (80 mg total) into the skin daily. 30 mL 0    ferrous gluconate (FERGON) 324 MG tablet Take 1 tablet (324 mg total) by mouth every other day. 60 tablet 1    glucose blood (ACCU-CHEK GUIDE) test strip Use as instructed QID 100 each 12    insulin detemir (LEVEMIR) 100 UNIT/ML FlexPen Inject 20 Units into the skin daily. (Patient not taking: Reported on 10/28/2020) 15 mL 11    Insulin Disposable Pump (OMNIPOD DASH INTRO, GEN 4,) KIT 105 Units by Does not apply route every other day. (Patient not taking: Reported on 11/15/2020) 1 kit 0    Insulin Disposable Pump (OMNIPOD DASH PODS, GEN 4,) MISC Inject 1 Device into the skin every other day. (Patient not taking: Reported on 12/05/2020) 3 each 7    insulin lispro (HUMALOG) 100 UNIT/ML injection Inject 1.05 mLs (105 Units total) into the skin daily. For use with omnipod (Patient not taking: Reported on 11/15/2020) 30 mL 11    Misc. Devices (GOJJI WEIGHT SCALE) MISC 1 Device by Does not apply route as needed. (Patient not taking: Reported on 12/05/2020) 1 each 0    Prenatal Vit-Fe Fumarate-FA (MULTIVITAMIN-PRENATAL) 27-0.8 MG TABS tablet Take 1 tablet by mouth daily at 12 noon.      ULTICARE INSULIN SYRINGE 30G X 1/2" 0.3 ML MISC See admin instructions. with insulin (Patient not taking: Reported on 10/28/2020)       I have reviewed patient's Past Medical Hx, Surgical Hx, Family Hx, Social Hx,  medications and allergies.   ROS:  Review of Systems  Constitutional:  Negative for fever.  Gastrointestinal:  Negative for abdominal pain.  Genitourinary:  Negative for dysuria and pelvic pain.  Musculoskeletal:  Positive for back pain.  Neurological:  Negative for headaches and paresthesias.  Other systems negative  Physical Exam  Patient Vitals for the past 24 hrs:  BP Temp Pulse Resp SpO2  01/06/21 0015 (!) 149/69 -- 97 -- 97 %  01/06/21 0002 (!) 149/79 -- (!) 106 -- --  01/05/21 2353 (!) 158/96 98.1 F (36.7 C) (!) 115 16 98 %   Constitutional: Well-developed, well-nourished female in no acute distress.  Cardiovascular: normal rate  Respiratory: normal effort GI: Abd soft, non-tender, gravid appropriate for gestational age.   No rebound or guarding. MS: Extremities nontender, no edema, normal ROM Neurologic: Alert and oriented x 4.  GU: Neg CVAT bilaterally  PELVIC EXAM: Cervix long and closed  FHT:  146   Labs: Results for orders placed or performed during the hospital encounter of 01/05/21 (from the past 24 hour(s))  Urinalysis, Routine w reflex microscopic Urine, Clean Catch     Status: Abnormal   Collection Time: 01/05/21 11:46 PM  Result Value Ref Range   Color, Urine YELLOW YELLOW   APPearance CLOUDY (A) CLEAR   Specific Gravity, Urine 1.025 1.005 - 1.030   pH 6.0 5.0 - 8.0   Glucose, UA NEGATIVE NEGATIVE mg/dL   Hgb urine dipstick NEGATIVE NEGATIVE   Bilirubin Urine NEGATIVE NEGATIVE   Ketones, ur NEGATIVE NEGATIVE mg/dL   Protein, ur 30 (A) NEGATIVE mg/dL   Nitrite NEGATIVE NEGATIVE   Leukocytes,Ua LARGE (A) NEGATIVE  Urinalysis, Microscopic (reflex)     Status: Abnormal   Collection Time: 01/05/21 11:46 PM  Result Value Ref Range   RBC / HPF 6-10 0 - 5 RBC/hpf   WBC, UA 11-20 0 - 5 WBC/hpf   Bacteria, UA MANY (A) NONE SEEN   Squamous Epithelial / LPF 6-10 0 - 5   A/Positive/-- (09/21 1048)  Imaging:  US Renal  Result Date: 01/06/2021 CLINICAL  DATA:  Left flank pain EXAM: RENAL / URINARY TRACT ULTRASOUND COMPLETE COMPARISON:  None FINDINGS: Right Kidney: Renal measurements: 12.9 x 6.1 x 6.1 cm = volume: 253 mL. Echogenicity within normal limits. A thin echogenic line is seen within the upper pole of the right renal cortex compatible with a normal anatomic variant (inter-renicular septum). No mass or hydronephrosis visualized. Left Kidney: Renal measurements: 14.0 x 5.6 x 6.4 cm = volume: 267 mL. Echogenicity within normal limits. No mass or hydronephrosis visualized. Bladder: Appears normal for degree of bladder distention. Other: The visualized hepatic parenchyma appears diffusely echogenic likely related to underlying hepatic steatosis. IMPRESSION: Normal renal sonogram. Hepatic steatosis. Electronically Signed   By: Fidela Salisbury M.D.   On: 01/06/2021 01:43     MAU Course/MDM: I have ordered labs and reviewed results. UA suggestive of UTI, sent for culture .  Treatments in MAU included Flexeril which did relieve pain.  Discussed the pain is likely muscle spasm but we did check a renal US to rule out stones.  This was normal  I do want to treat her for UTI however, given her UA results. .    Assessment: Single IUP at 69w4dLeft flank pain Muscle spasm Leukocytes and bacteria in urine   Plan: Discharge home Rx Keflex for probable UTI Rx Flexeril for muscle spasm prn Urine to culture Preterm Labor precautions and fetal kick counts Follow up in Office for prenatal visits  Encouraged to return if she develops worsening of symptoms, increase in pain, fever, or other concerning symptoms.  Pt stable at time of discharge.  MHansel FeinsteinCNM, MSN Certified Nurse-Midwife 01/06/2021 12:27 AM

## 2021-01-06 NOTE — BH Specialist Note (Signed)
Pt did not arrive to video visit and did not answer the phone; Left HIPPA-compliant message to call back Keelan Tripodi from [facility] at  [phone] (Annely Sliva's office).  ?; left MyChart message for patient.  ? ?

## 2021-01-06 NOTE — Patient Instructions (Addendum)
Dexcom continous glucose monitor: From son's laptop go to the website [email or web address] and use your decom cable to connect the receiver to upload your glucose data.  To finish setting up the [place]: Download the poddercentral app on your phone Then sync your PDM to the app

## 2021-01-06 NOTE — Progress Notes (Signed)
Marie Williams CNM in earlier to discuss d/c plan. Written and verbal d/c instructions given and understanding voiced. 

## 2021-01-07 LAB — CULTURE, OB URINE

## 2021-01-07 NOTE — Progress Notes (Signed)
CGM Training - Dexcom G6  Start time:1340    End time: 1440 Total time: 60  [x]   Download Dexcom G6 and Clarity apps to phone [x]   Accept invite to share data through Clarity [x]   Getting to know device: Sensor:  2 hour warmup for new sensor/transmitter Waterproof Receiver: Phone vs reader. Pt has READER, phone is not compatible [x]   Setting up device (high alert  220  , low alert 60  ) []   Rise alert set at   -- mg/dL  []   Fall alert set at    --  mg/dL  [x]   Setting alert profile: (signal loss alarm set to off) alarms are very loud. Also, not to panic if getting message that blood sugar is dropping [x]   Inserting sensor: After application it takes 2 hr initial warm up before first reading.  []   Calibrating  [x]   Ending sensor session [x]   Trouble shooting:  []   Tape guide:  []   Insulin dosing from CGM readings.   Also set up [place] account to link Omnipod and Dexcom data. Patient is to use computer (son's laptop) to upload data from receiver to clarity website.  Patient has Dexcom tech support and my contact information.

## 2021-01-09 ENCOUNTER — Encounter (HOSPITAL_COMMUNITY): Payer: Self-pay | Admitting: Obstetrics & Gynecology

## 2021-01-09 ENCOUNTER — Other Ambulatory Visit: Payer: Self-pay

## 2021-01-09 ENCOUNTER — Inpatient Hospital Stay (HOSPITAL_COMMUNITY)
Admission: AD | Admit: 2021-01-09 | Discharge: 2021-01-09 | Disposition: A | Discharge status: Home / Self Care | Payer: Medicaid Other | Attending: Obstetrics & Gynecology | Admitting: Obstetrics & Gynecology

## 2021-01-09 DIAGNOSIS — M546 Pain in thoracic spine: Secondary | ICD-10-CM | POA: Diagnosis not present

## 2021-01-09 DIAGNOSIS — O26892 Other specified pregnancy related conditions, second trimester: Secondary | ICD-10-CM | POA: Diagnosis present

## 2021-01-09 DIAGNOSIS — M545 Low back pain, unspecified: Secondary | ICD-10-CM | POA: Insufficient documentation

## 2021-01-09 DIAGNOSIS — Z79899 Other long term (current) drug therapy: Secondary | ICD-10-CM | POA: Insufficient documentation

## 2021-01-09 DIAGNOSIS — Z86711 Personal history of pulmonary embolism: Secondary | ICD-10-CM | POA: Diagnosis not present

## 2021-01-09 DIAGNOSIS — O10919 Unspecified pre-existing hypertension complicating pregnancy, unspecified trimester: Secondary | ICD-10-CM | POA: Diagnosis not present

## 2021-01-09 DIAGNOSIS — O10912 Unspecified pre-existing hypertension complicating pregnancy, second trimester: Secondary | ICD-10-CM | POA: Insufficient documentation

## 2021-01-09 DIAGNOSIS — Z7901 Long term (current) use of anticoagulants: Secondary | ICD-10-CM | POA: Diagnosis not present

## 2021-01-09 DIAGNOSIS — O09522 Supervision of elderly multigravida, second trimester: Secondary | ICD-10-CM | POA: Diagnosis not present

## 2021-01-09 DIAGNOSIS — M549 Dorsalgia, unspecified: Secondary | ICD-10-CM

## 2021-01-09 DIAGNOSIS — Z3A22 22 weeks gestation of pregnancy: Secondary | ICD-10-CM | POA: Diagnosis not present

## 2021-01-09 DIAGNOSIS — O368390 Maternal care for abnormalities of the fetal heart rate or rhythm, unspecified trimester, not applicable or unspecified: Secondary | ICD-10-CM | POA: Diagnosis not present

## 2021-01-09 DIAGNOSIS — R079 Chest pain, unspecified: Secondary | ICD-10-CM | POA: Insufficient documentation

## 2021-01-09 LAB — CBC
HCT: 35.4 % — ABNORMAL LOW (ref 36.0–46.0)
Hemoglobin: 10.4 g/dL — ABNORMAL LOW (ref 12.0–15.0)
MCH: 18.9 pg — ABNORMAL LOW (ref 26.0–34.0)
MCHC: 29.4 g/dL — ABNORMAL LOW (ref 30.0–36.0)
MCV: 64.4 fL — ABNORMAL LOW (ref 80.0–100.0)
Platelets: 487 10*3/uL — ABNORMAL HIGH (ref 150–400)
RBC: 5.5 MIL/uL — ABNORMAL HIGH (ref 3.87–5.11)
RDW: 21 % — ABNORMAL HIGH (ref 11.5–15.5)
WBC: 19.1 10*3/uL — ABNORMAL HIGH (ref 4.0–10.5)
nRBC: 0.1 % (ref 0.0–0.2)

## 2021-01-09 LAB — COMPREHENSIVE METABOLIC PANEL
ALT: 24 U/L (ref 0–44)
AST: 18 U/L (ref 15–41)
Albumin: 3.3 g/dL — ABNORMAL LOW (ref 3.5–5.0)
Alkaline Phosphatase: 413 U/L — ABNORMAL HIGH (ref 38–126)
Anion gap: 11 (ref 5–15)
BUN: 7 mg/dL (ref 6–20)
CO2: 21 mmol/L — ABNORMAL LOW (ref 22–32)
Calcium: 9.4 mg/dL (ref 8.9–10.3)
Chloride: 101 mmol/L (ref 98–111)
Creatinine, Ser: 0.71 mg/dL (ref 0.44–1.00)
GFR, Estimated: >60 mL/min (ref >60)
Glucose, Bld: 134 mg/dL — ABNORMAL HIGH (ref 70–99)
Potassium: 3.9 mmol/L (ref 3.5–5.1)
Sodium: 133 mmol/L — ABNORMAL LOW (ref 135–145)
Total Bilirubin: 0.4 mg/dL (ref 0.3–1.2)
Total Protein: 7.8 g/dL (ref 6.5–8.1)

## 2021-01-09 LAB — URINALYSIS, MICROSCOPIC (REFLEX)

## 2021-01-09 LAB — URINALYSIS, ROUTINE W REFLEX MICROSCOPIC
Bilirubin Urine: NEGATIVE
Glucose, UA: NEGATIVE mg/dL
Hgb urine dipstick: NEGATIVE
Ketones, ur: NEGATIVE mg/dL
Nitrite: NEGATIVE
Protein, ur: NEGATIVE mg/dL
Specific Gravity, Urine: 1.025 (ref 1.005–1.030)
pH: 6.5 (ref 5.0–8.0)

## 2021-01-09 LAB — PROTEIN / CREATININE RATIO, URINE
Creatinine, Urine: 151.82 mg/dL
Protein Creatinine Ratio: 0.26 mg/mg{Cre} — ABNORMAL HIGH (ref 0.00–0.15)
Total Protein, Urine: 40 mg/dL

## 2021-01-09 LAB — TROPONIN I (HIGH SENSITIVITY): Troponin I (High Sensitivity): 10 ng/L (ref <18)

## 2021-01-09 LAB — LIPASE, BLOOD: Lipase: 26 U/L (ref 11–51)

## 2021-01-09 MED ORDER — LIDOCAINE VISCOUS HCL 2 % MT SOLN
15.0000 mL | Freq: Once | OROMUCOSAL | Status: AC
  Administered 2021-01-09: 15 mL via ORAL
  Filled 2021-01-09: qty 15

## 2021-01-09 MED ORDER — ENOXAPARIN SODIUM 80 MG/0.8ML IJ SOSY: 70.0000 mg | PREFILLED_SYRINGE | Freq: Two times a day (BID) | INTRAMUSCULAR | 1 refills | Status: DC

## 2021-01-09 MED ORDER — ALUM & MAG HYDROXIDE-SIMETH 200-200-20 MG/5ML PO SUSP
30.0000 mL | Freq: Once | ORAL | Status: AC
  Administered 2021-01-09: 30 mL via ORAL
  Filled 2021-01-09: qty 30

## 2021-01-09 MED ORDER — SIMETHICONE 80 MG PO CHEW
80.0000 mg | CHEWABLE_TABLET | Freq: Once | ORAL | Status: AC
  Administered 2021-01-09: 80 mg via ORAL
  Filled 2021-01-09: qty 1

## 2021-01-09 MED ORDER — ACETAMINOPHEN 500 MG PO TABS
1000.0000 mg | ORAL_TABLET | Freq: Once | ORAL | Status: DC
  Filled 2021-01-09: qty 2

## 2021-01-09 MED ORDER — ACETAMINOPHEN 325 MG PO TABS
650.0000 mg | ORAL_TABLET | Freq: Once | ORAL | Status: AC
  Administered 2021-01-09: 650 mg via ORAL

## 2021-01-09 MED ORDER — OXYCODONE-ACETAMINOPHEN 5-325 MG PO TABS: 1.0000 | ORAL_TABLET | Freq: Four times a day (QID) | ORAL | 0 refills | Status: DC | PRN

## 2021-01-09 MED ORDER — CYCLOBENZAPRINE HCL 5 MG PO TABS
10.0000 mg | ORAL_TABLET | Freq: Once | ORAL | Status: AC
  Administered 2021-01-09: 10 mg via ORAL
  Filled 2021-01-09: qty 2

## 2021-01-09 NOTE — MAU Note (Signed)
Virginia Fitzgerald is a 35 y.o. at [redacted]w[redacted]d here in MAU reporting: chest, lower back, and abdominal pain since 1530. No bleeding or discharge.  Onset of complaint: today  Pain score: 10/10  Vitals:   01/09/21 1832  BP: (!) 159/96  Pulse: 87  Resp: 20  Temp: (!) 97.5 F (36.4 C)  SpO2: 99%     Lab orders placed from triage: UA

## 2021-01-09 NOTE — Progress Notes (Signed)
Pt informed that the ultrasound is considered a limited OB ultrasound and is not intended to be a complete ultrasound exam.  Patient also informed that the ultrasound is not being completed with the intent of assessing for fetal or placental anomalies or any pelvic abnormalities.  Explained that the purpose of today's ultrasound is to assess for  viability.  Patient acknowledges the purpose of the exam and the limitations of the study.    Live IUP with FHR 143 bpm  Jorje Guild, NP

## 2021-01-09 NOTE — MAU Provider Note (Addendum)
History    CSN: [id number]  Arrival date and time: 01/09/21 1817   Event Date/Time   First Provider Initiated Contact with Patient 01/09/21 1917     Chief Complaint  Patient presents with   Abdominal Pain   Back Pain   Chest Pain   35 year old G5P3 presenting today for lower back pain that started all of the sudden today while at work. Pain started around 1500. Has been constant since onset. Never had this pain before. States it will radiate to her chest, causing chest pain. Is worse with ambulation. Denies SOB, HA or dizziness. Nothing seems to make the pain better. Has tried tylenol without improvement of pain. Has not tried heating pad. Denies dysuria, increased vaginal discharge or leakage of fluid. +FM.   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 Does not feel similar to her PE. Does not feel like reflux.  Does have cHTN, has taken labetalol today.    OB History     Gravida  5   Para  3   Term  3   Preterm  0   AB  1   Living  3      SAB  1   IAB  0   Ectopic  0   Multiple      Live Births  3        Obstetric Comments  Largest prior was 7lbs 7oz. No issues with deliveries.          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   A1C 6.5    Fatty liver    GDM (gestational diabetes mellitus)    WITH 2019 PREGNANCY ONLY   GERD (gastroesophageal reflux disease)    diet controlled - no meds   Headache    last one 05/25/17 - otc med   History of blood transfusion 2012   At [facility]   History of DVT of lower extremity    History of hiatal hernia    Iron deficiency anemia 25/05/3974   Metallic taste 73/41/9379   MRSA infection    not in EPIC - per patient in 2011   Pulmonary embolism ([place])    S/P IVC filter    Thrombocytosis     Past Surgical History:  Procedure Laterality Date   DILATION AND EVACUATION N/A 05/29/2017    Procedure: DILATATION AND EVACUATION;  Surgeon: Lavonia Drafts, MD;  Location: [facility];  Service: Gynecology;  Laterality: N/A;   WISDOM TOOTH EXTRACTION      Family History  Problem Relation Age of Onset   Diabetes Mother    Hypertension Mother    Hypertension Maternal Uncle    Diabetes Maternal Uncle    Hypertension Maternal Grandmother     Social History   Tobacco Use   Smoking status: Never   Smokeless tobacco: Never   Tobacco comments:    never used tobacco  Vaping Use   Vaping Use: Never used  Substance Use Topics   Alcohol use: No    Alcohol/week: 0.0 standard drinks   Drug use: No    Allergies: No Known Allergies  Medications Prior to Admission  Medication Sig Dispense Refill Last Dose   aspirin EC 81 MG tablet Take 1 tablet (81 mg total) by mouth daily. Swallow whole. 30 tablet 11 01/09/2021   enoxaparin (LOVENOX) 80 MG/0.8ML injection  Inject 0.8 mLs (80 mg total) into the skin daily. 30 mL 0 01/08/2021   ferrous gluconate (FERGON) 324 MG tablet Take 1 tablet (324 mg total) by mouth every other day. 60 tablet 1 01/09/2021   ACCU-CHEK FASTCLIX LANCETS MISC 1 Device by Percutaneous route 4 (four) times daily. 100 each 12    acetaminophen (TYLENOL) 500 MG tablet Take 1,000 mg by mouth every 8 (eight) hours as needed for moderate pain.      blood glucose meter kit and supplies Dispense based on patient and insurance preference. Use up to four times daily as directed. (FOR ICD-10 E10.9, E11.9). (Patient not taking: Reported on 12/09/2020) 1 each 0    Blood Glucose Monitoring Suppl (ACCU-CHEK GUIDE) w/Device KIT 1 Device by Does not apply route 4 (four) times daily. (Patient not taking: Reported on 12/09/2020) 1 kit 0    Blood Pressure Monitoring (BLOOD PRESSURE KIT) DEVI 1 Device by Does not apply route as needed. (Patient not taking: Reported on 12/09/2020) 1 each 0    cephALEXin (KEFLEX) 500 MG capsule Take 1 capsule (500 mg total) by mouth 4 (four) times daily.  28 capsule 2    cetirizine (ZYRTEC ALLERGY) 10 MG tablet Take 1 tablet (10 mg total) by mouth daily. (Patient not taking: Reported on 11/15/2020) 14 tablet 0    Continuous Blood Gluc Receiver (DEXCOM G6 RECEIVER) DEVI For use with Dexcom continuous Glucose Monitor. (Patient not taking: Reported on 12/05/2020) 1 each 0    Continuous Blood Gluc Transmit (DEXCOM G6 TRANSMITTER) MISC 1 Device by Does not apply route every 3 (three) months. (Patient not taking: Reported on 12/05/2020) 3 each 1    cyclobenzaprine (FLEXERIL) 5 MG tablet Take 1 tablet (5 mg total) by mouth 3 (three) times daily as needed for muscle spasms. 30 tablet 0    glucose blood (ACCU-CHEK GUIDE) test strip Use as instructed QID 100 each 12    insulin detemir (LEVEMIR) 100 UNIT/ML FlexPen Inject 20 Units into the skin daily. (Patient not taking: Reported on 10/28/2020) 15 mL 11    Insulin Disposable Pump (OMNIPOD DASH INTRO, GEN 4,) KIT 105 Units by Does not apply route every other day. (Patient not taking: Reported on 11/15/2020) 1 kit 0    Insulin Disposable Pump (OMNIPOD DASH PODS, GEN 4,) MISC Inject 1 Device into the skin every other day. (Patient not taking: Reported on 12/05/2020) 3 each 7    insulin lispro (HUMALOG) 100 UNIT/ML injection Inject 1.05 mLs (105 Units total) into the skin daily. For use with omnipod (Patient not taking: Reported on 11/15/2020) 30 mL 11    labetalol (NORMODYNE) 200 MG tablet Take 1 tablet (200 mg total) by mouth 2 (two) times daily. 60 tablet 3    Misc. Devices (GOJJI WEIGHT SCALE) MISC 1 Device by Does not apply route as needed. (Patient not taking: Reported on 12/05/2020) 1 each 0    Prenatal Vit-Fe Fumarate-FA (MULTIVITAMIN-PRENATAL) 27-0.8 MG TABS tablet Take 1 tablet by mouth daily at 12 noon.      ULTICARE INSULIN SYRINGE 30G X 1/2" 0.3 ML MISC See admin instructions. with insulin (Patient not taking: Reported on 10/28/2020)      Review of Systems  Eyes:  Negative for photophobia and visual  disturbance.  Respiratory:  Negative for shortness of breath.   Psychiatric/Behavioral:  Negative for agitation.   Physical Exam   Blood pressure (!) 159/96, pulse 87, temperature (!) 97.5 F (36.4 C), temperature source Oral, resp. rate 20, height 5'  3" (1.6 m), weight 134.2 kg, last menstrual period 08/08/2020, SpO2 99 %.  Physical Exam Vitals and nursing note reviewed.  Constitutional:      General: She is in acute distress (pt appears uncomfortable).  HENT:     Head: Normocephalic and atraumatic.  Cardiovascular:     Rate and Rhythm: Normal rate and regular rhythm.  Pulmonary:     Effort: Pulmonary effort is normal. No respiratory distress.  Abdominal:     General: Abdomen is protuberant.     Palpations: Abdomen is soft.     Tenderness: There is generalized abdominal tenderness.  Musculoskeletal:     Thoracic back: Tenderness present.  Skin:    General: Skin is warm and dry.  Neurological:     General: No focal deficit present.     Mental Status: She is alert.  Psychiatric:        Mood and Affect: Mood normal.        Behavior: Behavior normal.   MAU Course  Procedures Results for orders placed or performed during the hospital encounter of 01/09/21 (from the past 24 hour(s))  Urinalysis, Routine w reflex microscopic Urine, Clean Catch     Status: Abnormal   Collection Time: 01/09/21  6:33 PM  Result Value Ref Range   Color, Urine YELLOW YELLOW   APPearance CLOUDY (A) CLEAR   Specific Gravity, Urine 1.025 1.005 - 1.030   pH 6.5 5.0 - 8.0   Glucose, UA NEGATIVE NEGATIVE mg/dL   Hgb urine dipstick NEGATIVE NEGATIVE   Bilirubin Urine NEGATIVE NEGATIVE   Ketones, ur NEGATIVE NEGATIVE mg/dL   Protein, ur NEGATIVE NEGATIVE mg/dL   Nitrite NEGATIVE NEGATIVE   Leukocytes,Ua SMALL (A) NEGATIVE  Urinalysis, Microscopic (reflex)     Status: Abnormal   Collection Time: 01/09/21  6:33 PM  Result Value Ref Range   RBC / HPF 0-5 0 - 5 RBC/hpf   WBC, UA 11-20 0 - 5 WBC/hpf    Bacteria, UA MANY (A) NONE SEEN   Squamous Epithelial / LPF 11-20 0 - 5   Mucus PRESENT   Protein / creatinine ratio, urine     Status: Abnormal   Collection Time: 01/09/21  7:40 PM  Result Value Ref Range   Creatinine, Urine 151.82 mg/dL   Total Protein, Urine 40 mg/dL   Protein Creatinine Ratio 0.26 (H) 0.00 - 0.15 mg/mg[Cre]  CBC     Status: Abnormal   Collection Time: 01/09/21  8:32 PM  Result Value Ref Range   WBC 19.1 (H) 4.0 - 10.5 K/uL   RBC 5.50 (H) 3.87 - 5.11 MIL/uL   Hemoglobin 10.4 (L) 12.0 - 15.0 g/dL   HCT 35.4 (L) 36.0 - 46.0 %   MCV 64.4 (L) 80.0 - 100.0 fL   MCH 18.9 (L) 26.0 - 34.0 pg   MCHC 29.4 (L) 30.0 - 36.0 g/dL   RDW 21.0 (H) 11.5 - 15.5 %   Platelets 487 (H) 150 - 400 K/uL   nRBC 0.1 0.0 - 0.2 %    MDM Patient presenting with back pain. VSS, is mildly tachycardic but anticipate this is due to pain. Pain most likely muscle spasm. Has flexeril at home and this has helped. Has not taken any today. Not hypoxic, low suspicion for PE however if pain medication does not improve pain, will consider CTA for PE protocol. BP elevated in triage, will order PreE labs. Denies any symptoms related to PreE. EKG to rule out cardiac etiology. Observe in MAU for improvement  of pain.   Layla Barter M.D., PGY-2 01/09/2021, 7:38 PM   Patient given Tylenol & flexeril for symptoms with no improvement. Reports pain in mid to lower back that radiates to right side of her abdomen & epigastric area. Labs still pending. Added GI cocktail & simethicone to meds given in MAU.   BSUS performed for FHTs - see separate note for documentation  Reviewed chart & exam with Dr. Elonda Husky. Recommends increase of lovenox to 70 mg BID for weight based dosing.   Care turned over to Dr. Kalman Shan, Junie Panning, NP 01/09/2021 9:57 PM   Assessment and Plan   Acute mid back pain - Upon reassessment, patient is no longer having any pain is her chest or abdomen. She is only having pain in her  mid-back without radiation.  - Physical Exam: Nontender to palpation over thoracic and lumbar spine, nontender over paraspinal musculature, abdomen soft and nontender without rebound or guarding  - Patient states her pain is somewhat improved after treatments noted above, though still painful - Constant in nature but intermittently gets sharp, describes pain as a cramp  - Lipase and CMP normal, troponin normal, EKG normal, PCR 0.26 (similar to prior), CBC with leukocytosis (only slightly more elevated from baseline)  - No GU symptoms, UA without obvious infection or blood, recent renal US negative  - VSS, no hypoxia, no SOB - BP elevated however has not taken BP medications this evening due to being in MAU - Discussed most likely etiology of back pain is MSK; however, it is possible that her pain could be related to chronic PE as well - No signs/symptoms of acute PE, no GU or urinary symptoms to suggest UTI, renal stone, or pelvic infection  - Will increase dose of Lovenox to 70 mg BID - Will continue Flexeril along with short course of Percocet to help with acute pain - Recommended heat and abdominal binder to help offload pressure on her low back  - Strict return precautions reviewed should her symptoms worsen or fail to improve   Chronic hypertension affecting pregnancy - BP elevated in MAU, did not take evening dose of BP medication  - No signs/symptoms of pre-E - Platelets, LFTs, PCR within normal range  - Will have patient return to office for BP check in 3 days to reassess whether dose adjustment is needed - Message sent to [facility] to coordinate this   Vilma Meckel, MD

## 2021-01-09 NOTE — Progress Notes (Signed)
Start time:  1415   End time:  1630  Patient was seen for training on the Omnipod DASH Insulin Pump.  It was confirmed that the patient was aware of the following: Blood glucose (BG) testing Treating hypoglycemia Hyperglycemia Carbohydrate counting   (Patient will not be using carbohydrate counting with use of this pump.) Sick day management  Patient was instructed on the following: Have the following supplies available to be prepared: Omnipod PDM and pods Insulin and syringe Blood glucose meter, strips, lancets, lancing device Glucose tablets/fast acting source of carbohydrate/Glucagon  Supply Reorder [place] [phone] Reorder - info/contact number When to reorder (when last box of pods is opened)  System Overview Communication process/distance Storage guidelines Diagnostic tests (CT scans/MRI/Xrays) Travel guidelines  Pod: Fill port/adhesive/needle cap/pink slide insert/Waterproof  PDM Battery/button layout/wireless updates (software updates)  PDM Settings Pump Therapy Order Form with settings below Basic settings - Personalized lock screen/time/time zone/date/date format Basal settings Max basal 3.0 Basal rates 1.6 Temp basal   Bolus settings Target Blood glucose  70-140 Insulin to carb (IC) ratio  n/a Correction factor  n/a for BG above 140 Minimum blood glucose for bolus calculations  n/a Reverse correction OFF Duration of insulin action  4.0 Extended bolus OFF Max bolus 20 Patient was able to accurately enter pump settings into PDM.  Pod Activation (education only, patient did not bring insulin to visit. Activated Pod on 01/06/2021 Change Pod  Room temperature insulin Fill syringe - min/max amounts DO NOT prefill Pod Site selection/rotation & prep Automated cannula insertion - check infusion site/viewing window & pink slide insert Blood glucose reminder 1.5 hours after insertion When to change POD Patient was able to place  pod and view insert.  Home Screen Status Bar, Menu Icon, Notification/Alarms Tabs Dashboard - IOB (if Calculator ON) - Instructed to leave calculator on Basal (Temp Basal if Temp Basal running) Pod Info - View Pod detail Last Bolus, Last BG Bolus button  Menu icon PDM function - Temp Basal, Pod, Enter BG, Suspend Manage Programs & Presets - Basal programs, temp basal presets, bolus presets Food Library ([employer] app, other tools for carbohydrate counting) History - Notifications & Alarms, Insulin & BG History Settings - PDM Device, Pod sites, Reminders, Blood Glucose, Basal & Temp Basal, Bolus About  Advanced Features (to be discussed at a further date based on patient needs) Extended bolus Temp basal rate Additional basal programs Presets - Temp basal/Bolus presets  Troubleshooting Hypoglycemia Sick day management resource Hyperglycemia & Ketones  Notifications & Alarms Custom reminders Pod Expiration alert [place] alert  Advisory & Hazard Alarms Advisory alarms - intermittent tones - response required [place] alarm - Continuous vibration, and Tone - urgent attention required - Pod Expired, Empty [place]ervoir, [place], Pod Error, Auto Off, PDM Error, System Error  Ongoing [employer] [place] invitation provided Omnipod app(s) Register for [employer] 24/7 Customer Care  [phone] Reviewed User Guide Showed patient Customer's Bill of Rights and Responsibilities and instructed them to review.   Handouts Provided: Insulin Pump Packet  Patient to contact me via MyChart or phone.

## 2021-01-09 NOTE — Discharge Instructions (Signed)
Take Percocet as needed for your back pain. You may also continue taking Flexeril as needed for back spasms as well.   You can use a heating pad to help soothe your pain as well. Try wearing an abdominal binder to take pressure off of your back.   Increased your Lovenox to 70 mg twice daily (12 hours apart). This was sent to your pharmacy.   We will have you follow up for a blood pressure check in the office later this week to make sure you are on the right dose of your medication.   If your symptoms worsen or fail to improve, return to MAU or ED for further evaluation.

## 2021-01-10 ENCOUNTER — Other Ambulatory Visit: Payer: Self-pay

## 2021-01-10 ENCOUNTER — Encounter: Payer: Self-pay | Admitting: Obstetrics and Gynecology

## 2021-01-10 ENCOUNTER — Telehealth: Payer: Self-pay | Admitting: Registered"

## 2021-01-10 ENCOUNTER — Telehealth: Payer: Medicaid Other | Admitting: Registered"

## 2021-01-10 ENCOUNTER — Ambulatory Visit: Payer: Self-pay

## 2021-01-10 NOTE — Telephone Encounter (Signed)
Patient was scheduled for a MyChart video visit but did not log on. Called patient and she states that since she was discharged from the hospital yesterday she has continued to have a lot of pain. Pt reports that she was instructed to come in for a nurse visit tomorrow to have blood pressure checked and also she states she was told that she might have a clot in her back.  Pt states she continues to use Omnipod and denies hypoglycemia. Pt reports her blood sugar is ~80 fasting and 103 after meals. Pt states she is injecting bolus insulin for meals and does not have any questions or issues at this time.  Pt agreed to set up a follow-up nutrition/diabetes appointment after she is feeling better. [facility] does not have access to her Omnipod data or blood sugar data. Pt was not able to connect device data upload during last visit and has not done this step since last visit.

## 2021-01-11 ENCOUNTER — Other Ambulatory Visit: Payer: Self-pay

## 2021-01-12 ENCOUNTER — Telehealth: Payer: Self-pay

## 2021-01-12 ENCOUNTER — Ambulatory Visit: Payer: Medicaid Other

## 2021-01-12 NOTE — Telephone Encounter (Signed)
Called pt to follow up on missed appt this AM for BP check. Patient states she has been feeling poorly and taking medication for this that caused her to miss appointment. Will be seen tomorrow by [facility]. Encouraged pt to keep this appointment and pick up BP cuff so that she can continue to check at home. Pt will notify the office with any elevated BP. Routine prenatal appt 01/19/21.

## 2021-01-13 ENCOUNTER — Encounter: Payer: Self-pay | Admitting: Cardiology

## 2021-01-13 ENCOUNTER — Ambulatory Visit (INDEPENDENT_AMBULATORY_CARE_PROVIDER_SITE_OTHER): Payer: Medicaid Other | Admitting: Cardiology

## 2021-01-13 ENCOUNTER — Other Ambulatory Visit: Payer: Self-pay

## 2021-01-13 VITALS — BP 146/100 | HR 99 | Ht 63.0 in | Wt 283.6 lb

## 2021-01-13 DIAGNOSIS — E119 Type 2 diabetes mellitus without complications: Secondary | ICD-10-CM

## 2021-01-13 DIAGNOSIS — Z794 Long term (current) use of insulin: Secondary | ICD-10-CM

## 2021-01-13 DIAGNOSIS — I2782 Chronic pulmonary embolism: Secondary | ICD-10-CM

## 2021-01-13 DIAGNOSIS — E782 Mixed hyperlipidemia: Secondary | ICD-10-CM | POA: Diagnosis not present

## 2021-01-13 DIAGNOSIS — Z719 Counseling, unspecified: Secondary | ICD-10-CM

## 2021-01-13 DIAGNOSIS — O10919 Unspecified pre-existing hypertension complicating pregnancy, unspecified trimester: Secondary | ICD-10-CM | POA: Diagnosis not present

## 2021-01-13 DIAGNOSIS — O99210 Obesity complicating pregnancy, unspecified trimester: Secondary | ICD-10-CM

## 2021-01-13 MED ORDER — NIFEDIPINE ER 30 MG PO TB24: 30.0000 mg | ORAL_TABLET | Freq: Every day | ORAL | 3 refills | Status: DC

## 2021-01-13 MED ORDER — FUROSEMIDE 20 MG PO TABS: 20.0000 mg | ORAL_TABLET | ORAL | 0 refills | Status: DC

## 2021-01-13 NOTE — Patient Instructions (Signed)
Medication Instructions:  START Nifedipine 30 mg daily START Lasix 20 mg once a week *If you need a refill on your cardiac medications before your next appointment, please call your pharmacy*  Lab Work: NONE ordered at this time of appointment   If you have labs (blood work) drawn today and your tests are completely normal, you will receive your results only by: [place] (if you have MyChart) OR A paper copy in the mail If you have any lab test that is abnormal or we need to change your treatment, we will call you to review the results.  Testing/Procedures: NONE ordered at this time of appointment   Follow-Up: At [facility], you and your health needs are our priority.  As part of our continuing mission to provide you with exceptional heart care, we have created designated Provider Care Teams.  These Care Teams include your primary Cardiologist (physician) and Advanced Practice Providers (APPs -  Physician Assistants and Nurse Practitioners) who all work together to provide you with the care you need, when you need it.  Your next appointment:   1-2 week(s)  The format for your next appointment:   In Person  Provider:   Berniece Salines, DO   [address] [place], [place] [address] [phone]  Other Instructions

## 2021-01-13 NOTE — Progress Notes (Addendum)
Cardio-Obstetrics Clinic  New Evaluation  Date:  01/14/2021   ID:  Virginia Fitzgerald, DOB 1985-12-26, MRN [id number]  PCP:  Vevelyn Francois, NP   [facility] Providers Cardiologist:  Berniece Salines, DO  Electrophysiologist:  None       Referring MD: Aletha Halim, MD   Chief Complaint: " I am ok"  History of Present Illness:    Virginia Fitzgerald is a 35 y.o. female [G5P3013] who is being seen today for the evaluation of chronic hypertension in pregnancy at the request of Aletha Halim, MD.   Medical history includes hypertension, diabetes mellitus, DVT, status post IVC filter, pulmonary embolism on Lovenox, morbid obesity here today to establish care.  The patient tells me that she has currently been placed on labetalol 200 mg twice a day for her hypertension.  But unfortunately this is not controlling her blood pressure.  Today she denies any shortness of breath or chest pain.  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 ([place]) 03/28/2017   A1C 6.5    Fatty liver    GDM (gestational diabetes mellitus)    WITH 2019 PREGNANCY ONLY   GERD (gastroesophageal reflux disease)    diet controlled - no meds   Headache    last one 05/25/17 - otc med   History of blood transfusion 2012   At [facility]   History of DVT of lower extremity    History of hiatal hernia    Iron deficiency anemia 10/62/6948   Metallic taste 54/62/7035   MRSA infection    not in EPIC - per patient in 2011   Pulmonary embolism ([place])    S/P IVC filter    Thrombocytosis     Past Surgical History:  Procedure Laterality Date   DILATION AND EVACUATION N/A 05/29/2017   Procedure: DILATATION AND EVACUATION;  Surgeon: Lavonia Drafts, MD;  Location: [facility];  Service: Gynecology;  Laterality: N/A;   WISDOM TOOTH EXTRACTION        OB  History     Gravida  5   Para  3   Term  3   Preterm  0   AB  1   Living  3      SAB  1   IAB  0   Ectopic  0   Multiple      Live Births  3        Obstetric Comments  Largest prior was 7lbs 7oz. No issues with deliveries.              Current Medications: Current Meds  Medication Sig   acetaminophen (TYLENOL) 500 MG tablet Take 1,000 mg by mouth every 8 (eight) hours as needed for moderate pain.   aspirin EC 81 MG tablet Take 1 tablet (81 mg total) by mouth daily. Swallow whole.   cephALEXin (KEFLEX) 500 MG capsule Take 1 capsule (500 mg total) by mouth 4 (four) times daily.   Continuous Blood Gluc Receiver ([place]) DEVI For use with Dexcom continuous Glucose Monitor.   Continuous Blood Gluc Sensor (DEXCOM G6 SENSOR) MISC Apply topically as directed.   Continuous Blood Gluc Transmit (DEXCOM G6 TRANSMITTER) MISC 1 Device by Does not apply route every 3 (three) months.   cyclobenzaprine (FLEXERIL) 5 MG tablet Take 1 tablet (5 mg total) by mouth  3 (three) times daily as needed for muscle spasms.   enoxaparin (LOVENOX) 80 MG/0.8ML injection Inject 0.7 mLs (70 mg total) into the skin every 12 (twelve) hours.   ferrous gluconate (FERGON) 324 MG tablet Take 1 tablet (324 mg total) by mouth every other day.   furosemide (LASIX) 20 MG tablet Take 1 tablet (20 mg total) by mouth once a week for 2 doses.   glucose blood (ACCU-CHEK GUIDE) test strip Use as instructed QID   Insulin Disposable Pump (OMNIPOD DASH INTRO, GEN 4,) KIT 105 Units by Does not apply route every other day.   Insulin Disposable Pump (OMNIPOD DASH PODS, GEN 4,) MISC Inject 1 Device into the skin every other day.   insulin lispro (HUMALOG) 100 UNIT/ML injection Inject 1.05 mLs (105 Units total) into the skin daily. For use with omnipod   labetalol (NORMODYNE) 200 MG tablet Take 1 tablet (200 mg total) by mouth 2 (two) times daily.   Misc. Devices (GOJJI WEIGHT SCALE) MISC 1 Device by Does not  apply route as needed.   NIFEdipine (ADALAT CC) 30 MG 24 hr tablet Take 1 tablet (30 mg total) by mouth daily.   oxyCODONE-acetaminophen (PERCOCET) 5-325 MG tablet Take 1 tablet by mouth every 6 (six) hours as needed for severe pain.   Prenatal Vit-Fe Fumarate-FA (MULTIVITAMIN-PRENATAL) 27-0.8 MG TABS tablet Take 1 tablet by mouth daily at 12 noon.     Allergies:   Patient has no known allergies.   Social History   Socioeconomic History   Marital status: Single    Spouse name: Not on file   Number of children: Not on file   Years of education: Not on file   Highest education level: Not on file  Occupational History   Not on file  Tobacco Use   Smoking status: Never   Smokeless tobacco: Never   Tobacco comments:    never used tobacco  Vaping Use   Vaping Use: Never used  Substance and Sexual Activity   Alcohol use: No    Alcohol/week: 0.0 standard drinks   Drug use: No   Sexual activity: Yes    Birth control/protection: None  Other Topics Concern   Not on file  Social History Narrative   ** Merged History Encounter **       Social Determinants of Health   Financial Resource Strain: Not on file  Food Insecurity: Food Insecurity Present   Worried About Charity fundraiser in the Last Year: Sometimes true   Ran Out of Food in the Last Year: Never true  Transportation Needs: No Transportation Needs   Lack of Transportation (Medical): No   Lack of Transportation (Non-Medical): No  Physical Activity: Not on file  Stress: Not on file  Social Connections: Not on file      Family History  Problem Relation Age of Onset   Diabetes Mother    Hypertension Mother    Hypertension Maternal Uncle    Diabetes Maternal Uncle    Hypertension Maternal Grandmother       ROS:   Please see the history of present illness.     All other systems reviewed and are negative.   Labs/EKG Reviewed:    EKG:   EKG is was ordered today.  The ekg ordered today demonstrates sinus  rhythm, heart rate 99 bpm  Recent Labs: 01/09/2021: ALT 24; BUN 7; Creatinine, Ser 0.71; Hemoglobin 10.4; Platelets 487; Potassium 3.9; Sodium 133   Recent Lipid Panel Lab Results  Component  Value Date/Time   CHOL 194 02/11/2020 11:06 AM   TRIG 211 (H) 02/11/2020 11:06 AM   HDL 31 (L) 02/11/2020 11:06 AM   CHOLHDL 6.3 (H) 02/11/2020 11:06 AM   CHOLHDL 5.8 04/14/2009 08:36 AM   LDLCALC 125 (H) 02/11/2020 11:06 AM    Physical Exam:    VS:  BP (!) 146/100 (BP Location: Left Arm)    Pulse 99    Ht '5\' 3"'  (1.6 m)    Wt 283 lb 9.6 oz (128.6 kg)    LMP 08/08/2020    SpO2 98%    BMI 50.24 kg/m     Wt Readings from Last 3 Encounters:  01/13/21 283 lb 9.6 oz (128.6 kg)  01/09/21 295 lb 14.4 oz (134.2 kg)  01/02/21 291 lb 12.8 oz (132.4 kg)     GEN:  Well nourished, well developed in no acute distress HEENT: Normal NECK: No JVD; No carotid bruits LYMPHATICS: No lymphadenopathy CARDIAC: RRR, no murmurs, rubs, gallops RESPIRATORY:  Clear to auscultation without rales, wheezing or rhonchi  ABDOMEN: Soft, non-tender, non-distended MUSCULOSKELETAL: +2 lower extremity edema; No deformity  SKIN: Warm and dry NEUROLOGIC:  Alert and oriented x 3 PSYCHIATRIC:  Normal affect     Risk Assessment/Risk Calculators:     CARPREG II Risk Prediction Index Score:  1.  The patient's risk for a primary cardiac event is 5%.            ASSESSMENT & PLAN:    Chronic hypertension pregnancy -she is currently on labetalol 200 mg twice daily manually for me in the office her blood pressure is 146/100 mmHg.  She is not controlled therefore I will like to add nifedipine 30 mg daily to her regimen.  She has a blood pressure at home.  She is going to take her blood pressure daily for me and send me that information in the next 5 days.  I did advise the patient should her blood pressure still be over 140/90 for the next 2 days when she started her nifedipine to notify my office immediately.  She does have  significant leg edema I like to give the patient 2 doses of Lasix which I am hoping is going to help with fluid overload as well as her blood pressure as well.  Continue her aspirin for preeclampsia prophylaxis.  Diabetes mellitus -she is on insulin.  She is going to continue this per her primary OB/GYN team.  Obesity in pregnancy -I counseled the patient about keeping her weight loss less than 20 pounds during this pregnancy.  DVT/PE -she is on Lovenox.  This is being managed by the OB/GYN team as well.  Delivery planning and arranging her anticoagulation will be needed.  Hyperlipidemia-continue diet modification for now.  Please keep the patient off statins.  And also if she decides to breast-feed we will also have to hold her from taking statins during this time.  Our visit also was very lengthy which included counseling the patient of her future cardiovascular risk and complication.  Post partum once she is no longer breast-feeding we will discuss restratification which may potentially include coronary calcium scoring if she is not symptomatic at that time.  And then getting the patient back on lipid-lowering medication will also be important.   CV risk counseling and primary prevention:  -recommend heart healthy/Mediterranean diet, with whole grains, fruits, vegetable, fish, lean meats, nuts, and olive oil. Limit salt. -recommend moderate walking, 3-5 times/week for 30-50 minutes each session. Aim for at  least 150 minutes.week. Goal should be pace of 3 miles/hours, or walking 1.5 miles in 30 minutes -recommend avoidance of tobacco products. Avoid excess alcohol.   The patient is in agreement with the above plan. The patient left the office in stable condition.  The patient will follow up in 1 to 2 weeks for blood pressure follow-up.   Patient Instructions  Medication Instructions:  START Nifedipine 30 mg daily START Lasix 20 mg once a week *If you need a refill on your cardiac  medications before your next appointment, please call your pharmacy*  Lab Work: NONE ordered at this time of appointment   If you have labs (blood work) drawn today and your tests are completely normal, you will receive your results only by: [place] (if you have MyChart) OR A paper copy in the mail If you have any lab test that is abnormal or we need to change your treatment, we will call you to review the results.  Testing/Procedures: NONE ordered at this time of appointment   Follow-Up: At [facility], you and your health needs are our priority.  As part of our continuing mission to provide you with exceptional heart care, we have created designated Provider Care Teams.  These Care Teams include your primary Cardiologist (physician) and Advanced Practice Providers (APPs -  Physician Assistants and Nurse Practitioners) who all work together to provide you with the care you need, when you need it.  Your next appointment:   1-2 week(s)  The format for your next appointment:   In Person  Provider:   Berniece Salines, DO   [address] [place], [place] [address] [phone]  Other Instructions     Dispo:  Return in about 11 days (around 01/24/2021).   Medication Adjustments/Labs and Tests Ordered: Current medicines are reviewed at length with the patient today.  Concerns regarding medicines are outlined above.  Tests Ordered: Orders Placed This Encounter  Procedures   EKG 12-Lead   Medication Changes: Meds ordered this encounter  Medications   furosemide (LASIX) 20 MG tablet    Sig: Take 1 tablet (20 mg total) by mouth once a week for 2 doses.    Dispense:  2 tablet    Refill:  0   NIFEdipine (ADALAT CC) 30 MG 24 hr tablet    Sig: Take 1 tablet (30 mg total) by mouth daily.    Dispense:  90 tablet    Refill:  3

## 2021-01-14 DIAGNOSIS — Z719 Counseling, unspecified: Secondary | ICD-10-CM | POA: Insufficient documentation

## 2021-01-14 DIAGNOSIS — E119 Type 2 diabetes mellitus without complications: Secondary | ICD-10-CM | POA: Insufficient documentation

## 2021-01-14 DIAGNOSIS — I2782 Chronic pulmonary embolism: Secondary | ICD-10-CM | POA: Insufficient documentation

## 2021-01-14 DIAGNOSIS — E782 Mixed hyperlipidemia: Secondary | ICD-10-CM | POA: Insufficient documentation

## 2021-01-14 DIAGNOSIS — O10919 Unspecified pre-existing hypertension complicating pregnancy, unspecified trimester: Secondary | ICD-10-CM | POA: Insufficient documentation

## 2021-01-14 DIAGNOSIS — Z794 Long term (current) use of insulin: Secondary | ICD-10-CM | POA: Insufficient documentation

## 2021-01-14 NOTE — Addendum Note (Signed)
Addended by: Berniece Salines on: 01/14/2021 07:44 PM   Modules accepted: Level of Service

## 2021-01-15 ENCOUNTER — Encounter: Payer: Self-pay | Admitting: Obstetrics and Gynecology

## 2021-01-19 ENCOUNTER — Ambulatory Visit (INDEPENDENT_AMBULATORY_CARE_PROVIDER_SITE_OTHER): Payer: Medicaid Other | Admitting: Obstetrics and Gynecology

## 2021-01-19 ENCOUNTER — Encounter: Payer: Self-pay | Admitting: Registered"

## 2021-01-19 ENCOUNTER — Other Ambulatory Visit: Payer: Self-pay

## 2021-01-19 ENCOUNTER — Ambulatory Visit: Payer: Medicaid Other | Admitting: Clinical

## 2021-01-19 VITALS — BP 125/79 | HR 95 | Wt 297.1 lb

## 2021-01-19 DIAGNOSIS — O365990 Maternal care for other known or suspected poor fetal growth, unspecified trimester, not applicable or unspecified: Secondary | ICD-10-CM

## 2021-01-19 DIAGNOSIS — Z6841 Body Mass Index (BMI) 40.0 and over, adult: Secondary | ICD-10-CM

## 2021-01-19 DIAGNOSIS — Z3A23 23 weeks gestation of pregnancy: Secondary | ICD-10-CM

## 2021-01-19 DIAGNOSIS — O10919 Unspecified pre-existing hypertension complicating pregnancy, unspecified trimester: Secondary | ICD-10-CM

## 2021-01-19 DIAGNOSIS — O283 Abnormal ultrasonic finding on antenatal screening of mother: Secondary | ICD-10-CM

## 2021-01-19 DIAGNOSIS — Z91199 Patient's noncompliance with other medical treatment and regimen due to unspecified reason: Secondary | ICD-10-CM

## 2021-01-19 DIAGNOSIS — Z7901 Long term (current) use of anticoagulants: Secondary | ICD-10-CM

## 2021-01-19 DIAGNOSIS — O24119 Pre-existing diabetes mellitus, type 2, in pregnancy, unspecified trimester: Secondary | ICD-10-CM

## 2021-01-19 DIAGNOSIS — I2782 Chronic pulmonary embolism: Secondary | ICD-10-CM

## 2021-01-19 DIAGNOSIS — O09521 Supervision of elderly multigravida, first trimester: Secondary | ICD-10-CM

## 2021-01-19 DIAGNOSIS — O99210 Obesity complicating pregnancy, unspecified trimester: Secondary | ICD-10-CM

## 2021-01-19 NOTE — Progress Notes (Signed)
Reader and connector cord for dexcom

## 2021-01-19 NOTE — Progress Notes (Signed)
Pt was in the office for an OB visit. Patient provided [place]  reader and data was uploaded to Clarity app.   Pt reports she has not eaten since midnight. Patient was taught how to use the Menu and add events feature to mark meals and exercise. Pt may not have had 50 g cho, this was just entered to show Pt how to do it. Pt states she at [facility] and wings. 2 hrs PPBG was ~130.   Pt will return for diabetes education follow-up visit to discuss blood sugar excursions and potentially making adjustments to insulin pump.

## 2021-01-19 NOTE — Progress Notes (Signed)
PRENATAL VISIT NOTE  Subjective:  Virginia Fitzgerald is a 35 y.o. O1Y0737 at [redacted]w[redacted]d being seen today for ongoing prenatal care.  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 ([place]); History of COVID-19; Type 2 diabetes mellitus without complication, without long-term current use of insulin ([place]); History of recurrent deep vein thrombosis (DVT); Abnormal chest x-ray; Allergies; Supervision of high risk pregnancy, antepartum; Pre-existing type 2 diabetes mellitus during pregnancy, antepartum; AMA (advanced maternal age) multigravida 35+, first trimester; Anticoagulation in pregnancy; Anemia in pregnancy; Rubella non-immune status, antepartum; Low fetal fraction on [place] at 10 wks; Elevated AFP; S/P IVC filter; IUGR (intrauterine growth restriction) affecting care of mother; Short fetal long bones; Chronic hypertension in pregnancy; Insulin-requiring or dependent type II diabetes mellitus ([place]); Chronic pulmonary embolism without acute cor pulmonale ([place]); Mixed hyperlipidemia; and Health education/counseling on their problem list.  Patient reports no complaints.  Contractions: Not present. Vag. Bleeding: None.  Movement: Present. Denies leaking of fluid.   The following portions of the patient's history were reviewed and updated as appropriate: allergies, current medications, past family history, past medical history, past social history, past surgical history and problem list.   Objective:   Vitals:   01/19/21 1046  BP: 125/79  Pulse: 95  Weight: 297 lb 1.6 oz (134.8 kg)    Fetal Status: Fetal Heart Rate (bpm): 131   Movement: Present     General:  Alert, oriented and cooperative. Patient is in no acute distress.  Skin: Skin is warm and dry. No rash  noted.   Cardiovascular: Normal heart rate noted  Respiratory: Normal respiratory effort, no problems with respiration noted  Abdomen: Soft, gravid, appropriate for gestational age.  Pain/Pressure: Absent     Pelvic: Cervical exam deferred        Extremities: Normal range of motion.  Edema: None  Mental Status: Normal mood and affect. Normal behavior. Normal judgment and thought content.   Assessment and Plan:  Pregnancy: G5P3013 at [redacted]w[redacted]d 1. [redacted] weeks gestation of pregnancy - Genetic Screening  2. Chronic hypertension in pregnancy Seen by Dr. Harriet Masson on 12/16 and patient started on procardia 30 qday and lasix 20 qwk and kept on her labetalol 200 bid; pt has f/u visit on 12/27  3. Pre-existing type 2 diabetes mellitus during pregnancy, antepartum Pt to see DM education later this morning and work on her CGM meter issues; currently reading at 119. Pt also has omnipod  4. Chronic pulmonary embolism without acute cor pulmonale, unspecified pulmonary embolism type (HCC) Pt on lovenox and it I confirmed with her to be on 70 q12h given her weight  5. Short fetal long bones Followed by MFM. Patient had [employer] to assess for FGFR gene Fetal echo was scheduled for 12/8 at [facility]. I forgot to ask patient about this and when I called her back after visit there was no answer >>ask about nv  6. Obesity in pregnancy 19lbs TWG this pregnancy  7. BMI 50.0-59.9, adult ([place])  8. Anticoagulation in pregnancy See above  9. AMA (advanced maternal age) multigravida 42+, first trimester See above  10. FGR 12/5: fgr, <1%, 218gm, afi wnl, UA at 95% Follow up 1/4 mfm repeat u/s  Preterm labor symptoms and general obstetric precautions including but not limited to vaginal bleeding, contractions, leaking of fluid and  fetal movement were reviewed in detail with the patient. Please refer to After Visit Summary for other counseling recommendations.   Return in about 13 days (around 02/01/2021) for in person, md  visit, high risk ob.  Future Appointments  Date Time Provider [place]  01/19/2021  1:15 PM [facility] HEALTH CLINICIAN [facility]  01/24/2021  4:30 PM Berniece Salines, DO [facility]  02/01/2021 11:00 AM [facility] NURSE [facility]  02/01/2021 11:15 AM [facility] US2 [facility]  02/02/2021 10:15 AM [facility]-EDUCATION [facility]  02/06/2021  4:15 PM Aletha Halim, MD [facility]    Aletha Halim, MD  ADDENDUM CBGs look acceptable. Continue with titration of omnipod and diet

## 2021-01-24 ENCOUNTER — Other Ambulatory Visit: Payer: Self-pay

## 2021-01-24 ENCOUNTER — Encounter: Payer: Self-pay | Admitting: Cardiology

## 2021-01-24 ENCOUNTER — Ambulatory Visit (INDEPENDENT_AMBULATORY_CARE_PROVIDER_SITE_OTHER): Payer: Medicaid Other | Admitting: Cardiology

## 2021-01-24 VITALS — BP 126/70 | HR 112 | Ht 63.0 in | Wt 296.2 lb

## 2021-01-24 DIAGNOSIS — E782 Mixed hyperlipidemia: Secondary | ICD-10-CM | POA: Diagnosis not present

## 2021-01-24 DIAGNOSIS — O99210 Obesity complicating pregnancy, unspecified trimester: Secondary | ICD-10-CM | POA: Diagnosis not present

## 2021-01-24 DIAGNOSIS — O10919 Unspecified pre-existing hypertension complicating pregnancy, unspecified trimester: Secondary | ICD-10-CM

## 2021-01-24 NOTE — Progress Notes (Signed)
Cardio-Obstetrics Clinic  Follow Up Note   Date:  01/26/2021   ID:  [name], DOB 12-31-1985, MRN [id number]  PCP:  Vevelyn Francois, NP   [facility] Providers Cardiologist:  Berniece Salines, DO  Electrophysiologist:  None        Referring MD: Vevelyn Francois, NP   Chief Complaint: " I am doing well"  History of Present Illness:    Virginia Fitzgerald is a 35 y.o. female [[id number]] who returns for follow up of chronic hypertension in pregnancy.  Her medical history includes hypertension, diabetes, DVT status post IVC filter on Lovenox along with obesity here today for follow-up visit.  I saw the patient on January 13, 2021 at that time she was hypertensive.  She was already taking labetalol 200 mg twice a day.  I added nifedipine 30 mg to her regimen.  Likely she did have significant leg edema and give the patient Lasix 40 mg x 2 doses to help fluid.  She is here today for follow-up visit.  She tells me the fluid in has improved some.  But the great news is her blood pressure has also improved.  No other complaints today.   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 ([place]) 03/28/2017   A1C 6.5    Fatty liver    GDM (gestational diabetes mellitus)    WITH 2019 PREGNANCY ONLY   GERD (gastroesophageal reflux disease)    diet controlled - no meds   Headache    last one 05/25/17 - otc med   History of blood transfusion 2012   At [facility]   History of DVT of lower extremity    History of hiatal hernia    Iron deficiency anemia 81/44/8185   Metallic taste 63/14/9702   MRSA infection    not in EPIC - per patient in 2011   Pulmonary embolism ([place])    S/P IVC filter    Thrombocytosis     Past Surgical History:  Procedure Laterality Date   DILATION AND EVACUATION N/A 05/29/2017   Procedure: DILATATION  AND EVACUATION;  Surgeon: Lavonia Drafts, MD;  Location: [facility];  Service: Gynecology;  Laterality: N/A;   WISDOM TOOTH EXTRACTION        OB History     Gravida  5   Para  3   Term  3   Preterm  0   AB  1   Living  3      SAB  1   IAB  0   Ectopic  0   Multiple      Live Births  3        Obstetric Comments  Largest prior was 7lbs 7oz. No issues with deliveries.              Current Medications: Current Meds  Medication Sig   ACCU-CHEK FASTCLIX LANCETS MISC 1 Device by Percutaneous route 4 (four) times daily.   acetaminophen (TYLENOL) 500 MG tablet Take 1,000 mg by mouth every 8 (eight) hours as needed for moderate pain.   aspirin EC 81 MG tablet Take 1 tablet (81 mg total) by mouth daily. Swallow whole.   cephALEXin (KEFLEX) 500 MG capsule Take 1 capsule (500 mg total) by mouth 4 (four) times daily.   Continuous Blood Gluc Receiver ([place])  DEVI For use with Dexcom continuous Glucose Monitor.   Continuous Blood Gluc Sensor (DEXCOM G6 SENSOR) MISC Apply topically as directed.   Continuous Blood Gluc Transmit (DEXCOM G6 TRANSMITTER) MISC 1 Device by Does not apply route every 3 (three) months.   cyclobenzaprine (FLEXERIL) 5 MG tablet Take 1 tablet (5 mg total) by mouth 3 (three) times daily as needed for muscle spasms.   enoxaparin (LOVENOX) 80 MG/0.8ML injection Inject 0.7 mLs (70 mg total) into the skin every 12 (twelve) hours.   ferrous gluconate (FERGON) 324 MG tablet Take 1 tablet (324 mg total) by mouth every other day.   furosemide (LASIX) 20 MG tablet Take 1 tablet (20 mg total) by mouth once a week for 2 doses.   glucose blood (ACCU-CHEK GUIDE) test strip Use as instructed QID   Insulin Disposable Pump (OMNIPOD DASH INTRO, GEN 4,) KIT 105 Units by Does not apply route every other day.   Insulin Disposable Pump (OMNIPOD DASH PODS, GEN 4,) MISC Inject 1 Device into the skin every other day.   insulin lispro (HUMALOG) 100 UNIT/ML  injection Inject 1.05 mLs (105 Units total) into the skin daily. For use with omnipod   labetalol (NORMODYNE) 200 MG tablet Take 1 tablet (200 mg total) by mouth 2 (two) times daily.   oxyCODONE-acetaminophen (PERCOCET) 5-325 MG tablet Take 1 tablet by mouth every 6 (six) hours as needed for severe pain.   Prenatal Vit-Fe Fumarate-FA (MULTIVITAMIN-PRENATAL) 27-0.8 MG TABS tablet Take 1 tablet by mouth daily at 12 noon.     Allergies:   Patient has no known allergies.   Social History   Socioeconomic History   Marital status: Single    Spouse name: Not on file   Number of children: Not on file   Years of education: Not on file   Highest education level: Not on file  Occupational History   Not on file  Tobacco Use   Smoking status: Never   Smokeless tobacco: Never   Tobacco comments:    never used tobacco  Vaping Use   Vaping Use: Never used  Substance and Sexual Activity   Alcohol use: No    Alcohol/week: 0.0 standard drinks   Drug use: No   Sexual activity: Yes    Birth control/protection: None  Other Topics Concern   Not on file  Social History Narrative   ** Merged History Encounter **       Social Determinants of Health   Financial Resource Strain: Not on file  Food Insecurity: Food Insecurity Present   Worried About Charity fundraiser in the Last Year: Sometimes true   Ran Out of Food in the Last Year: Never true  Transportation Needs: No Transportation Needs   Lack of Transportation (Medical): No   Lack of Transportation (Non-Medical): No  Physical Activity: Not on file  Stress: Not on file  Social Connections: Not on file      Family History  Problem Relation Age of Onset   Diabetes Mother    Hypertension Mother    Hypertension Maternal Uncle    Diabetes Maternal Uncle    Hypertension Maternal Grandmother       ROS:   Please see the history of present illness.     All other systems reviewed and are negative.   Labs/EKG Reviewed:    EKG:    EKG is was not ordered today.    Recent Labs: 01/09/2021: ALT 24; BUN 7; Creatinine, Ser 0.71; Hemoglobin 10.4; Platelets 487;  Potassium 3.9; Sodium 133   Recent Lipid Panel Lab Results  Component Value Date/Time   CHOL 194 02/11/2020 11:06 AM   TRIG 211 (H) 02/11/2020 11:06 AM   HDL 31 (L) 02/11/2020 11:06 AM   CHOLHDL 6.3 (H) 02/11/2020 11:06 AM   CHOLHDL 5.8 04/14/2009 08:36 AM   LDLCALC 125 (H) 02/11/2020 11:06 AM    Physical Exam:    VS:  BP 126/70    Pulse (!) 112    Ht '5\' 3"'  (1.6 m)    Wt 296 lb 3.2 oz (134.4 kg)    LMP 08/08/2020    SpO2 96%    BMI 52.47 kg/m     Wt Readings from Last 3 Encounters:  01/24/21 296 lb 3.2 oz (134.4 kg)  01/19/21 297 lb 1.6 oz (134.8 kg)  01/13/21 283 lb 9.6 oz (128.6 kg)     GEN:  Well nourished, well developed in no acute distress HEENT: Normal NECK: No JVD; No carotid bruits LYMPHATICS: No lymphadenopathy CARDIAC: RRR, no murmurs, rubs, gallops RESPIRATORY:  Clear to auscultation without rales, wheezing or rhonchi  ABDOMEN: Soft, non-tender, non-distended MUSCULOSKELETAL:  No edema; No deformity  SKIN: Warm and dry NEUROLOGIC:  Alert and oriented x 3 PSYCHIATRIC:  Normal affect    Risk Assessment/Risk Calculators:     CARPREG II Risk Prediction Index Score:  1.  The patient's risk for a primary cardiac event is 5%.            ASSESSMENT & PLAN:    Chronic hypertension in pregnancy-thankfully her blood pressure has improved significantly.  She will remain on the labetalol 200 mg twice a day along with the nifedipine 30 mg daily.  Bilateral leg edema has improved but still not resolved.  We will continue to monitor.  Diabetes mellitus -she is on insulin.  She is going to continue this per her primary OB/GYN team.  Obesity in pregnancy -I counseled the patient about keeping her weight loss less than 20 pounds during this pregnancy.  DVT/PE -she is on Lovenox.  This is being managed by the OB/GYN team as well.   Delivery planning and arranging her anticoagulation will be needed.  Hyperlipidemia-continue diet modification for now.  Please keep the patient off statins.  And also if she decides to breast-feed we will also have to hold her from taking statins during this time   Patient Instructions  Medication Instructions:  Your physician recommends that you continue on your current medications as directed. Please refer to the Current Medication list given to you today.  *If you need a refill on your cardiac medications before your next appointment, please call your pharmacy*   Lab Work: None If you have labs (blood work) drawn today and your tests are completely normal, you will receive your results only by: [place] (if you have MyChart) OR A paper copy in the mail If you have any lab test that is abnormal or we need to change your treatment, we will call you to review the results.   Testing/Procedures: None   Follow-Up: At [facility], you and your health needs are our priority.  As part of our continuing mission to provide you with exceptional heart care, we have created designated Provider Care Teams.  These Care Teams include your primary Cardiologist (physician) and Advanced Practice Providers (APPs -  Physician Assistants and Nurse Practitioners) who all work together to provide you with the care you need, when you need it.  We recommend signing up  for the patient portal called "MyChart".  Sign up information is provided on this After Visit Summary.  MyChart is used to connect with patients for Virtual Visits (Telemedicine).  Patients are able to view lab/test results, encounter notes, upcoming appointments, etc.  Non-urgent messages can be sent to your provider as well.   To learn more about what you can do with MyChart, go to [email or web address].    Your next appointment:   4 week(s)  The format for your next appointment:   In Person  Provider:   Berniece Salines, DO      Other Instructions     Dispo:  No follow-ups on file.   Medication Adjustments/Labs and Tests Ordered: Current medicines are reviewed at length with the patient today.  Concerns regarding medicines are outlined above.  Tests Ordered: No orders of the defined types were placed in this encounter.  Medication Changes: No orders of the defined types were placed in this encounter.

## 2021-01-24 NOTE — Patient Instructions (Signed)
Medication Instructions:  Your physician recommends that you continue on your current medications as directed. Please refer to the Current Medication list given to you today.  *If you need a refill on your cardiac medications before your next appointment, please call your pharmacy*   Lab Work: None If you have labs (blood work) drawn today and your tests are completely normal, you will receive your results only by: [place] (if you have MyChart) OR A paper copy in the mail If you have any lab test that is abnormal or we need to change your treatment, we will call you to review the results.   Testing/Procedures: None   Follow-Up: At [facility], you and your health needs are our priority.  As part of our continuing mission to provide you with exceptional heart care, we have created designated Provider Care Teams.  These Care Teams include your primary Cardiologist (physician) and Advanced Practice Providers (APPs -  Physician Assistants and Nurse Practitioners) who all work together to provide you with the care you need, when you need it.  We recommend signing up for the patient portal called "MyChart".  Sign up information is provided on this After Visit Summary.  MyChart is used to connect with patients for Virtual Visits (Telemedicine).  Patients are able to view lab/test results, encounter notes, upcoming appointments, etc.  Non-urgent messages can be sent to your provider as well.   To learn more about what you can do with MyChart, go to [email or web address].    Your next appointment:   4 week(s)  The format for your next appointment:   In Person  Provider:   Berniece Salines, DO     Other Instructions

## 2021-01-29 NOTE — L&D Delivery Note (Signed)
Delivery Note Called to room for excessive passage of blood which also revealed that the fetus had delivered precipitously.   At 2:00 AM a non-viable female was delivered via Vaginal, Spontaneous  APGAR: 0, 0; weight  .   Placenta status: Spontaneous delivery of part of placenta and cord.  Parts of placenta delivered by Manual removal; but was unable to deliver all of it.  Periods of heavy bleeding occurred so Dr Roselie Awkward was consulted for assistance.   He took her immediately to the OR for Curettage;Retained;Pathology, Adherent.  Cord: Unknown with the following complications: None.    Anesthesia: Epidural Episiotomy: None Lacerations: None Est. Blood Loss (mL): 800  Mom to postpartum.  Baby to [place].  Hansel Feinstein 02/03/2021, 5:11 AM

## 2021-01-31 ENCOUNTER — Encounter: Payer: Self-pay | Admitting: Obstetrics and Gynecology

## 2021-02-01 ENCOUNTER — Other Ambulatory Visit: Payer: Self-pay

## 2021-02-01 ENCOUNTER — Other Ambulatory Visit: Payer: Self-pay | Admitting: Advanced Practice Midwife

## 2021-02-01 ENCOUNTER — Ambulatory Visit (HOSPITAL_BASED_OUTPATIENT_CLINIC_OR_DEPARTMENT_OTHER): Payer: Medicaid Other | Admitting: Obstetrics

## 2021-02-01 ENCOUNTER — Ambulatory Visit: Payer: Medicaid Other | Admitting: *Deleted

## 2021-02-01 ENCOUNTER — Ambulatory Visit (HOSPITAL_BASED_OUTPATIENT_CLINIC_OR_DEPARTMENT_OTHER): Payer: Medicaid Other

## 2021-02-01 VITALS — BP 109/66 | HR 109

## 2021-02-01 DIAGNOSIS — O24112 Pre-existing diabetes mellitus, type 2, in pregnancy, second trimester: Secondary | ICD-10-CM

## 2021-02-01 DIAGNOSIS — Z3A25 25 weeks gestation of pregnancy: Secondary | ICD-10-CM | POA: Insufficient documentation

## 2021-02-01 DIAGNOSIS — Z86711 Personal history of pulmonary embolism: Secondary | ICD-10-CM

## 2021-02-01 DIAGNOSIS — O88212 Thromboembolism in pregnancy, second trimester: Secondary | ICD-10-CM | POA: Insufficient documentation

## 2021-02-01 DIAGNOSIS — O364XX0 Maternal care for intrauterine death, not applicable or unspecified: Secondary | ICD-10-CM

## 2021-02-01 DIAGNOSIS — O10912 Unspecified pre-existing hypertension complicating pregnancy, second trimester: Secondary | ICD-10-CM

## 2021-02-01 DIAGNOSIS — O99212 Obesity complicating pregnancy, second trimester: Secondary | ICD-10-CM

## 2021-02-01 DIAGNOSIS — R772 Abnormality of alphafetoprotein: Secondary | ICD-10-CM

## 2021-02-01 DIAGNOSIS — O0990 Supervision of high risk pregnancy, unspecified, unspecified trimester: Secondary | ICD-10-CM | POA: Insufficient documentation

## 2021-02-01 DIAGNOSIS — O35HXX0 Maternal care for other (suspected) fetal abnormality and damage, fetal lower extremities anomalies, not applicable or unspecified: Secondary | ICD-10-CM | POA: Insufficient documentation

## 2021-02-01 NOTE — Progress Notes (Signed)
MFM Note  Virginia Fitzgerald was seen for a follow-up exam due to an unexplained elevated MSAFP of 3.59 MoM.  During her prior ultrasound exams, shortened long bones were noted along with fetal growth restriction.  Due to the shortened long bones, possible skeletal dysplasia was suspected.  Her pregnancy has also been complicated by maternal obesity with a BMI of 50, pregestational diabetes treated with insulin, and chronic hypertension treated with labetalol and Procardia.  Due to her history of a prior pulmonary embolus, she is also treated with prophylactic Lovenox 80 mg daily.  She denies any problems since her last exam.  On today's exam, a fetal demise is noted.  The fetal biometry measurements obtained today measures at less than the 1st percentile for her gestational age.  The patient was advised that due to her elevated MSAFP level along with severe fetal growth restriction noted during her prior exam, that there was an increased risk for an IUFD.  She was reassured that there was probably nothing that she could have done to change the outcome.  Placental dysfunction most likely contributed to fetal growth restriction and ultimately the IUFD.  Due to the IUFD, she was advised that she will need to have an induction scheduled as soon as possible.  She reports 3 prior uncomplicated full-term vaginal deliveries.  I discussed her case with the second attending who will schedule her induction later this week.  The patient was advised to hold her Lovenox dose on the day of her scheduled induction.  The products of conception should be sent for the Anora test which is available through [facility] or the Reveal MicroArray test which is available through [facility] to help determine if a chromosomal abnormality or fetal skeletal dysplasia was present.    The patient stated that all of her questions have been answered.  A total of 20 minutes was spent counseling and coordinating the care for this patient.   Greater than 50% of the time was spent in direct face-to-face contact.

## 2021-02-02 ENCOUNTER — Inpatient Hospital Stay (HOSPITAL_COMMUNITY)
Admission: AD | Admit: 2021-02-02 | Discharge: 2021-02-03 | DRG: 796 | Disposition: A | Discharge status: Home / Self Care | Payer: Medicaid Other | Attending: Obstetrics and Gynecology | Admitting: Obstetrics and Gynecology

## 2021-02-02 ENCOUNTER — Inpatient Hospital Stay (HOSPITAL_COMMUNITY): Payer: Medicaid Other | Admitting: Anesthesiology

## 2021-02-02 ENCOUNTER — Other Ambulatory Visit: Payer: Self-pay | Admitting: Advanced Practice Midwife

## 2021-02-02 ENCOUNTER — Encounter (HOSPITAL_COMMUNITY): Payer: Self-pay | Admitting: Obstetrics & Gynecology

## 2021-02-02 ENCOUNTER — Inpatient Hospital Stay (HOSPITAL_COMMUNITY): Payer: Medicaid Other

## 2021-02-02 ENCOUNTER — Other Ambulatory Visit: Payer: Medicaid Other

## 2021-02-02 DIAGNOSIS — Z3A25 25 weeks gestation of pregnancy: Secondary | ICD-10-CM

## 2021-02-02 DIAGNOSIS — Z86718 Personal history of other venous thrombosis and embolism: Secondary | ICD-10-CM

## 2021-02-02 DIAGNOSIS — O2412 Pre-existing diabetes mellitus, type 2, in childbirth: Secondary | ICD-10-CM | POA: Diagnosis present

## 2021-02-02 DIAGNOSIS — D509 Iron deficiency anemia, unspecified: Secondary | ICD-10-CM | POA: Diagnosis present

## 2021-02-02 DIAGNOSIS — Z20822 Contact with and (suspected) exposure to covid-19: Secondary | ICD-10-CM | POA: Diagnosis present

## 2021-02-02 DIAGNOSIS — Z7982 Long term (current) use of aspirin: Secondary | ICD-10-CM

## 2021-02-02 DIAGNOSIS — O359XX0 Maternal care for (suspected) fetal abnormality and damage, unspecified, not applicable or unspecified: Secondary | ICD-10-CM | POA: Diagnosis present

## 2021-02-02 DIAGNOSIS — O99214 Obesity complicating childbirth: Secondary | ICD-10-CM | POA: Diagnosis present

## 2021-02-02 DIAGNOSIS — O24424 Gestational diabetes mellitus in childbirth, insulin controlled: Secondary | ICD-10-CM | POA: Diagnosis not present

## 2021-02-02 DIAGNOSIS — Z8614 Personal history of Methicillin resistant Staphylococcus aureus infection: Secondary | ICD-10-CM

## 2021-02-02 DIAGNOSIS — O364XX1 Maternal care for intrauterine death, fetus 1: Secondary | ICD-10-CM | POA: Diagnosis not present

## 2021-02-02 DIAGNOSIS — Z8616 Personal history of COVID-19: Secondary | ICD-10-CM | POA: Diagnosis not present

## 2021-02-02 DIAGNOSIS — Z7901 Long term (current) use of anticoagulants: Secondary | ICD-10-CM | POA: Diagnosis not present

## 2021-02-02 DIAGNOSIS — E119 Type 2 diabetes mellitus without complications: Secondary | ICD-10-CM | POA: Diagnosis present

## 2021-02-02 DIAGNOSIS — Z86711 Personal history of pulmonary embolism: Secondary | ICD-10-CM | POA: Diagnosis not present

## 2021-02-02 DIAGNOSIS — O364XX0 Maternal care for intrauterine death, not applicable or unspecified: Principal | ICD-10-CM | POA: Diagnosis present

## 2021-02-02 DIAGNOSIS — O9902 Anemia complicating childbirth: Secondary | ICD-10-CM | POA: Diagnosis present

## 2021-02-02 DIAGNOSIS — O1002 Pre-existing essential hypertension complicating childbirth: Secondary | ICD-10-CM | POA: Diagnosis present

## 2021-02-02 LAB — GLUCOSE, CAPILLARY
Glucose-Capillary: 109 mg/dL — ABNORMAL HIGH (ref 70–99)
Glucose-Capillary: 118 mg/dL — ABNORMAL HIGH (ref 70–99)
Glucose-Capillary: 113 mg/dL — ABNORMAL HIGH (ref 70–99)

## 2021-02-02 LAB — CBC
HCT: 29.8 % — ABNORMAL LOW (ref 36.0–46.0)
HCT: 29.6 % — ABNORMAL LOW (ref 36.0–46.0)
Hemoglobin: 8.5 g/dL — ABNORMAL LOW (ref 12.0–15.0)
Hemoglobin: 8.8 g/dL — ABNORMAL LOW (ref 12.0–15.0)
MCH: 19.5 pg — ABNORMAL LOW (ref 26.0–34.0)
MCH: 19.4 pg — ABNORMAL LOW (ref 26.0–34.0)
MCHC: 28.5 g/dL — ABNORMAL LOW (ref 30.0–36.0)
MCHC: 29.7 g/dL — ABNORMAL LOW (ref 30.0–36.0)
MCV: 68.2 fL — ABNORMAL LOW (ref 80.0–100.0)
MCV: 65.2 fL — ABNORMAL LOW (ref 80.0–100.0)
Platelets: 392 10*3/uL (ref 150–400)
Platelets: 427 10*3/uL — ABNORMAL HIGH (ref 150–400)
RBC: 4.37 MIL/uL (ref 3.87–5.11)
RBC: 4.54 MIL/uL (ref 3.87–5.11)
RDW: 22.2 % — ABNORMAL HIGH (ref 11.5–15.5)
RDW: 21.4 % — ABNORMAL HIGH (ref 11.5–15.5)
WBC: 12.8 10*3/uL — ABNORMAL HIGH (ref 4.0–10.5)
WBC: 12.3 10*3/uL — ABNORMAL HIGH (ref 4.0–10.5)
nRBC: 0 % (ref 0.0–0.2)
nRBC: 0 % (ref 0.0–0.2)

## 2021-02-02 LAB — DIC (DISSEMINATED INTRAVASCULAR COAGULATION)PANEL
D-Dimer, Quant: 0.76 ug/mL-FEU — ABNORMAL HIGH (ref 0.00–0.50)
Fibrinogen: 615 mg/dL — ABNORMAL HIGH (ref 210–475)
INR: 1 (ref 0.8–1.2)
Platelets: 428 10*3/uL — ABNORMAL HIGH (ref 150–400)
Prothrombin Time: 13.5 seconds (ref 11.4–15.2)
Smear Review: NONE SEEN
aPTT: 33 seconds (ref 24–36)

## 2021-02-02 LAB — RESP PANEL BY RT-PCR (FLU A&B, COVID) ARPGX2
Influenza A by PCR: NEGATIVE
Influenza B by PCR: NEGATIVE
SARS Coronavirus 2 by RT PCR: NEGATIVE

## 2021-02-02 LAB — RPR: RPR Ser Ql: NONREACTIVE

## 2021-02-02 MED ORDER — EPHEDRINE 5 MG/ML INJ: 10.0000 mg | INTRAVENOUS | Status: DC | PRN

## 2021-02-02 MED ORDER — PHENYLEPHRINE 40 MCG/ML (10ML) SYRINGE FOR IV PUSH (FOR BLOOD PRESSURE SUPPORT): 80.0000 ug | PREFILLED_SYRINGE | INTRAVENOUS | Status: DC | PRN

## 2021-02-02 MED ORDER — MISOPROSTOL 200 MCG PO TABS
400.0000 ug | ORAL_TABLET | ORAL | Status: DC
  Administered 2021-02-02: 400 ug via ORAL
  Filled 2021-02-02: qty 2

## 2021-02-02 MED ORDER — LACTATED RINGERS IV SOLN: INTRAVENOUS | Status: DC

## 2021-02-02 MED ORDER — MISOPROSTOL 200 MCG PO TABS
400.0000 ug | ORAL_TABLET | ORAL | Status: DC
  Administered 2021-02-02 (×2): 400 ug via BUCCAL
  Filled 2021-02-02 (×2): qty 2

## 2021-02-02 MED ORDER — DIPHENHYDRAMINE HCL 50 MG/ML IJ SOLN: 12.5000 mg | INTRAMUSCULAR | Status: DC | PRN

## 2021-02-02 MED ORDER — ONDANSETRON HCL 4 MG/2ML IJ SOLN: 4.0000 mg | Freq: Four times a day (QID) | INTRAMUSCULAR | Status: DC | PRN

## 2021-02-02 MED ORDER — SOD CITRATE-CITRIC ACID 500-334 MG/5ML PO SOLN
30.0000 mL | ORAL | Status: DC | PRN
Start: 2021-02-02 — End: 2021-02-03

## 2021-02-02 MED ORDER — OXYTOCIN BOLUS FROM INFUSION: 333.0000 mL | Freq: Once | INTRAVENOUS | Status: DC

## 2021-02-02 MED ORDER — MISOPROSTOL 200 MCG PO TABS
200.0000 ug | ORAL_TABLET | ORAL | Status: DC
  Administered 2021-02-02: 200 ug via BUCCAL
  Filled 2021-02-02: qty 1

## 2021-02-02 MED ORDER — FENTANYL CITRATE (PF) 100 MCG/2ML IJ SOLN
100.0000 ug | INTRAMUSCULAR | Status: DC | PRN
  Administered 2021-02-02 – 2021-02-03 (×5): 100 ug via INTRAVENOUS
  Filled 2021-02-02 (×4): qty 2

## 2021-02-02 MED ORDER — OXYCODONE-ACETAMINOPHEN 5-325 MG PO TABS: 1.0000 | ORAL_TABLET | ORAL | Status: DC | PRN

## 2021-02-02 MED ORDER — FENTANYL-BUPIVACAINE-NACL 0.5-0.125-0.9 MG/250ML-% EP SOLN
EPIDURAL | Status: DC | PRN
  Administered 2021-02-02: 12 mL/h via EPIDURAL

## 2021-02-02 MED ORDER — LACTATED RINGERS IV SOLN: 500.0000 mL | Freq: Once | INTRAVENOUS | Status: DC

## 2021-02-02 MED ORDER — OXYTOCIN-SODIUM CHLORIDE 30-0.9 UT/500ML-% IV SOLN
2.5000 [IU]/h | INTRAVENOUS | Status: DC
  Filled 2021-02-02: qty 500

## 2021-02-02 MED ORDER — ACETAMINOPHEN 325 MG PO TABS
650.0000 mg | ORAL_TABLET | ORAL | Status: DC | PRN
  Administered 2021-02-02: 650 mg via ORAL
  Filled 2021-02-02: qty 2

## 2021-02-02 MED ORDER — LACTATED RINGERS IV SOLN: 500.0000 mL | INTRAVENOUS | Status: DC | PRN

## 2021-02-02 MED ORDER — LIDOCAINE HCL (PF) 1 % IJ SOLN
INTRAMUSCULAR | Status: DC | PRN
  Administered 2021-02-02: 10 mL via EPIDURAL
  Administered 2021-02-02: 2 mL via EPIDURAL

## 2021-02-02 MED ORDER — NIFEDIPINE ER OSMOTIC RELEASE 30 MG PO TB24
30.0000 mg | ORAL_TABLET | Freq: Every day | ORAL | Status: DC
  Administered 2021-02-02: 30 mg via ORAL
  Filled 2021-02-02: qty 1

## 2021-02-02 MED ORDER — LABETALOL HCL 200 MG PO TABS
200.0000 mg | ORAL_TABLET | Freq: Two times a day (BID) | ORAL | Status: DC
  Administered 2021-02-02 (×2): 200 mg via ORAL
  Filled 2021-02-02 (×2): qty 1

## 2021-02-02 MED ORDER — LIDOCAINE HCL (PF) 1 % IJ SOLN: 30.0000 mL | INTRAMUSCULAR | Status: DC | PRN

## 2021-02-02 MED ORDER — FENTANYL-BUPIVACAINE-NACL 0.5-0.125-0.9 MG/250ML-% EP SOLN
12.0000 mL/h | EPIDURAL | Status: DC | PRN
  Filled 2021-02-02: qty 250

## 2021-02-02 MED ORDER — MISOPROSTOL 200 MCG PO TABS: 600.0000 ug | ORAL_TABLET | ORAL | Status: DC

## 2021-02-02 MED ORDER — OXYCODONE-ACETAMINOPHEN 5-325 MG PO TABS: 2.0000 | ORAL_TABLET | ORAL | Status: DC | PRN

## 2021-02-02 NOTE — Progress Notes (Signed)
Telemetry issue resolved at this time.  RN notes vital signs are no longer flowing over into this patient's chart.

## 2021-02-02 NOTE — Progress Notes (Addendum)
Labor progress note   Patient is doing well. She did have some increased cramping earlier that has eased greatly with IV fentanyl. BP has been under control with home medications.   Discussed anemia and recommendation for IV venofer. Will plan for this postpartum if EBL minimal.   Cervical exam about 2 cm/50. Constipation noted on exam. Placed cytotec 400 mcg vaginally. Monitor closely.   Patriciaann Clan, DO

## 2021-02-02 NOTE — Progress Notes (Signed)
RN noted vital signs from previous patient telemetry monitoring assigned to room 2s18 flowing into this patients vital sign flow sheet.  Charge RN made aware.  IT assistance contacted to rectify issue. Vital signs filed are for correct patient, Virginia Fitzgerald.

## 2021-02-02 NOTE — Progress Notes (Signed)
Patient ID: Virginia Fitzgerald, female   DOB: January 10, 1986, 36 y.o.   MRN: [id number] Doing well Not much pain, but feels cramping  Vitals:   02/02/21 1814 02/02/21 1900 02/02/21 2034 02/02/21 2109  BP: 129/75 129/77 (!) 144/67 125/71  Pulse: (!) 101 (!) 101 98 97  Resp: 18 18 18 18   Temp:  98.9 F (37.2 C)  99.3 F (37.4 C)  TempSrc:  Oral  Oral  Weight:      Height:       Dilation: 2 Effacement (%): Thick Station: -3 Exam by:: Hansel Feinstein, CNM  Unable to feel presenting part WIll change Cytotec from Vag to Oral

## 2021-02-02 NOTE — Progress Notes (Signed)
Labor Progress Note   Checking on patient, has received two doses of buccal cytotec (200>400 mcg). Reports she is doing well, feeling some cramping but not too bothersome. Would like to know what her cervical exam is when it is time for her next dose. Plan for vaginal cytotec at that time.    Patriciaann Clan, DO

## 2021-02-02 NOTE — Progress Notes (Signed)
Initial visit with Virginia Fitzgerald and Virginia Fitzgerald. Chaplain introduced spiritual care and asked open ended questions to facilitate story telling and emotional expression. Virginia Fitzgerald shared about Virginia difficult pregnancy with Virginia Fitzgerald and Virginia hopes and worries for Virginia delivery. She named some fears that she will have medical difficulty because Virginia pregnancy has been so difficult. She is also worried about what it will feel like to see the Virginia. Chaplain validated Virginia concern and need to advocate for herself and provided some basic education about what to expect. Virginia Fitzgerald also requested additional information about disposition of Virginia Fitzgerald. Virginia Fitzgerald has three other children, Virginia Fitzgerald who is 33, Virginia Fitzgerald who is 51, and an 66 year old daughter, Virginia Fitzgerald. Virginia Fitzgerald, the Virginia's father, is home with them at this time. He recently lost his grandfather.   Please page as further needs arise.  Donald Prose. Elyn Peers, M.Div. [facility] Chaplain Pager [phone] Office [phone]

## 2021-02-02 NOTE — Anesthesia Procedure Notes (Signed)
Epidural Patient location during procedure: OB Start time: 02/02/2021 10:27 PM End time: 02/02/2021 10:36 PM  Staffing Anesthesiologist: Pervis Hocking, DO Performed: anesthesiologist   Preanesthetic Checklist Completed: patient identified, IV checked, risks and benefits discussed, monitors and equipment checked, pre-op evaluation and timeout performed  Epidural Patient position: sitting Prep: DuraPrep and site prepped and draped Patient monitoring: continuous pulse ox, blood pressure, heart rate and cardiac monitor Approach: midline Location: L3-L4 Injection technique: LOR air  Needle:  Needle type: Tuohy  Needle gauge: 17 G Needle length: 9 cm Needle insertion depth: 9 cm Catheter type: closed end flexible Catheter size: 19 Gauge Catheter at skin depth: 15 cm Test dose: negative  Assessment Sensory level: T8 Events: blood not aspirated, injection not painful, no injection resistance, no paresthesia and negative IV test  Additional Notes Patient identified. Risks/Benefits/Options discussed with patient including but not limited to bleeding, infection, nerve damage, paralysis, failed block, incomplete pain control, headache, blood pressure changes, nausea, vomiting, reactions to medication both or allergic, itching and postpartum back pain. Confirmed with bedside nurse the patient's most recent platelet count. Confirmed with patient that they are not currently taking any anticoagulation, have any bleeding history or any family history of bleeding disorders. Patient expressed understanding and wished to proceed. All questions were answered. Sterile technique was used throughout the entire procedure. Please see nursing notes for vital signs. Test dose was given through epidural catheter and negative prior to continuing to dose epidural or start infusion. Warning signs of high block given to the patient including shortness of breath, tingling/numbness in hands, complete motor  block, or any concerning symptoms with instructions to call for help. Patient was given instructions on fall risk and not to get out of bed. All questions and concerns addressed with instructions to call with any issues or inadequate analgesia.  Reason for block:procedure for pain

## 2021-02-02 NOTE — Anesthesia Preprocedure Evaluation (Signed)
Anesthesia Evaluation  Patient identified by MRN, date of birth, ID band Patient awake    Reviewed: Allergy & Precautions, Patient's Chart, lab work & pertinent test results, reviewed documented beta blocker date and time   Airway Mallampati: III  TM Distance: >3 FB Neck ROM: Full    Dental no notable dental hx.    Pulmonary PE (hx PE on lovenox 0.5mg /kg BID- last dose >24h ago)   Pulmonary exam normal breath sounds clear to auscultation       Cardiovascular hypertension, Pt. on medications and Pt. on home beta blockers + DVT  Normal cardiovascular exam Rhythm:Regular Rate:Normal     Neuro/Psych  Headaches, negative psych ROS   GI/Hepatic Neg liver ROS, hiatal hernia, GERD  ,  Endo/Other  diabetes, Well Controlled, Type 2, Insulin DependentMorbid obesityBMI 71 Last a1c 09/2020 7.5  Renal/GU negative Renal ROS  negative genitourinary   Musculoskeletal negative musculoskeletal ROS (+)   Abdominal (+) + obese,   Peds negative pediatric ROS (+)  Hematology  (+) Blood dyscrasia, anemia , hct 29.6, plt 427 INR 1.0   Anesthesia Other Findings   Reproductive/Obstetrics (+) Pregnancy IUFD 25 5/7wks G5P3                             Anesthesia Physical Anesthesia Plan  ASA: 3  Anesthesia Plan: Epidural   Post-op Pain Management:    Induction:   PONV Risk Score and Plan: 2  Airway Management Planned: Natural Airway  Additional Equipment: None  Intra-op Plan:   Post-operative Plan:   Informed Consent: I have reviewed the patients History and Physical, chart, labs and discussed the procedure including the risks, benefits and alternatives for the proposed anesthesia with the patient or authorized representative who has indicated his/her understanding and acceptance.       Plan Discussed with:   Anesthesia Plan Comments:         Anesthesia Quick Evaluation

## 2021-02-02 NOTE — Progress Notes (Signed)
Follow up visit. Virginia Fitzgerald reports some increased discomfort. [name] has joined her and is resting on the couch. Her mother is also present. Family reports no needs at this time and is trying to get some rest. Chaplain provided additional blankets.   Please page as further needs arise.  Donald Prose. Elyn Peers, M.Div. [facility] Chaplain Pager [phone] Office [phone]

## 2021-02-02 NOTE — H&P (Signed)
OBSTETRIC ADMISSION HISTORY AND PHYSICAL  Virginia Fitzgerald is a 36 y.o. female [phone] with IUP at 73w3dby LMP presenting for IOL due to intrauterine fetal demise found at MFM yesterday.   On ultrasound, shortened long bones associated with FGR were noted, raising concern for possible skeletal dysplasia. Elevated AFP through screening but with negative horizon/panorama.   [facility]  Dating: By LMP --->  Estimated Date of Delivery: 05/15/21  Prenatal History/Complications:  --History of PE on lovenox 718mBID  --T2DM on insulin  --Chronic hypertension on labetalol and procardia  --AMA (3(62o)  --Elevated BMI (53)  --Elevated AFP   Past Medical History: 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 ([place]2/28/2019   A1C 6.5    Fatty liver    GDM (gestational diabetes mellitus)    WITH 2019 PREGNANCY ONLY   GERD (gastroesophageal reflux disease)    diet controlled - no meds   Headache    last one 05/25/17 - otc med   History of blood transfusion 2012   [facility] of DVT of lower extremity    History of hiatal hernia    Iron deficiency anemia 1073/22/0254 Metallic taste 1227/06/2374 MRSA infection    not in EPIC - per patient in 2011   Pulmonary embolism ([place]   S/P IVC filter    Thrombocytosis     Past Surgical History: Past Surgical History:  Procedure Laterality Date   DILATION AND EVACUATION N/A 05/29/2017   Procedure: DILATATION AND EVACUATION;  Surgeon: HaLavonia DraftsMD;  [facility];  Service: Gynecology;  Laterality: N/A;   WISDOM TOOTH EXTRACTION      Obstetrical History: OB History     Gravida  5   Para  3   Term  3   Preterm  0   AB  1   Living  3      SAB  1   IAB  0   Ectopic  0   Multiple      Live Births  3        Obstetric Comments  Largest prior was 7lbs 7oz. No issues with  deliveries.          Social History Social History   Socioeconomic History   Marital status: Single    Spouse name: Not on file   Number of children: Not on file   Years of education: Not on file   Highest education level: Not on file  Occupational History   Not on file  Tobacco Use   Smoking status: Never   Smokeless tobacco: Never   Tobacco comments:    never used tobacco  Vaping Use   Vaping Use: Never used  Substance and Sexual Activity   Alcohol use: No    Alcohol/week: 0.0 standard drinks   Drug use: No   Sexual activity: Yes    Birth control/protection: None  Other Topics Concern   Not on file  Social History Narrative   ** Merged History Encounter **       Social Determinants of Health   Financial Resource Strain: Not on file  Food Insecurity: Food Insecurity Present   Worried About RuDovern the Last Year: Sometimes true   Ran Out of Food in the Last Year: Never true  Transportation  Needs: No Transportation Needs   Lack of Transportation (Medical): No   Lack of Transportation (Non-Medical): No  Physical Activity: Not on file  Stress: Not on file  Social Connections: Not on file    Family History: Family History  Problem Relation Age of Onset   Diabetes Mother    Hypertension Mother    Hypertension Maternal Uncle    Diabetes Maternal Uncle    Hypertension Maternal Grandmother     Allergies: No Known Allergies  Medications Prior to Admission  Medication Sig Dispense Refill Last Dose   aspirin EC 81 MG tablet Take 1 tablet (81 mg total) by mouth daily. Swallow whole. 30 tablet 11 02/01/2021   enoxaparin (LOVENOX) 80 MG/0.8ML injection Inject 0.7 mLs (70 mg total) into the skin every 12 (twelve) hours. 90 mL 1 02/01/2021   labetalol (NORMODYNE) 200 MG tablet Take 1 tablet (200 mg total) by mouth 2 (two) times daily. 60 tablet 3 02/01/2021   NIFEdipine (ADALAT CC) 30 MG 24 hr tablet Take 1 tablet (30 mg total) by mouth daily. 90 tablet 3  02/01/2021   ACCU-CHEK FASTCLIX LANCETS MISC 1 Device by Percutaneous route 4 (four) times daily. 100 each 12    acetaminophen (TYLENOL) 500 MG tablet Take 1,000 mg by mouth every 8 (eight) hours as needed for moderate pain.      blood glucose meter kit and supplies Dispense based on patient and insurance preference. Use up to four times daily as directed. (FOR ICD-10 E10.9, E11.9). (Patient not taking: Reported on 12/09/2020) 1 each 0    Blood Glucose Monitoring Suppl (ACCU-CHEK GUIDE) w/Device KIT 1 Device by Does not apply route 4 (four) times daily. (Patient not taking: Reported on 12/09/2020) 1 kit 0    Blood Pressure Monitoring (BLOOD PRESSURE KIT) DEVI 1 Device by Does not apply route as needed. (Patient not taking: Reported on 12/09/2020) 1 each 0    cetirizine (ZYRTEC ALLERGY) 10 MG tablet Take 1 tablet (10 mg total) by mouth daily. (Patient not taking: Reported on 11/15/2020) 14 tablet 0    Continuous Blood Gluc Receiver (DEXCOM G6 RECEIVER) DEVI For use with Dexcom continuous Glucose Monitor. 1 each 0    Continuous Blood Gluc Sensor (DEXCOM G6 SENSOR) MISC Apply topically as directed.      Continuous Blood Gluc Transmit (DEXCOM G6 TRANSMITTER) MISC 1 Device by Does not apply route every 3 (three) months. 3 each 1    cyclobenzaprine (FLEXERIL) 5 MG tablet Take 1 tablet (5 mg total) by mouth 3 (three) times daily as needed for muscle spasms. 30 tablet 0    ferrous gluconate (FERGON) 324 MG tablet Take 1 tablet (324 mg total) by mouth every other day. 60 tablet 1    furosemide (LASIX) 20 MG tablet Take 1 tablet (20 mg total) by mouth once a week for 2 doses. 2 tablet 0    glucose blood (ACCU-CHEK GUIDE) test strip Use as instructed QID 100 each 12    insulin detemir (LEVEMIR) 100 UNIT/ML FlexPen Inject 20 Units into the skin daily. (Patient not taking: Reported on 10/28/2020) 15 mL 11    Insulin Disposable Pump (OMNIPOD DASH INTRO, GEN 4,) KIT 105 Units by Does not apply route every other day. 1  kit 0    Insulin Disposable Pump (OMNIPOD DASH PODS, GEN 4,) MISC Inject 1 Device into the skin every other day. 3 each 7    insulin lispro (HUMALOG) 100 UNIT/ML injection Inject 1.05 mLs (105 Units total) into the  skin daily. For use with omnipod 30 mL 11    Misc. Devices (GOJJI WEIGHT SCALE) MISC 1 Device by Does not apply route as needed. (Patient not taking: Reported on 01/19/2021) 1 each 0    oxyCODONE-acetaminophen (PERCOCET) 5-325 MG tablet Take 1 tablet by mouth every 6 (six) hours as needed for severe pain. 20 tablet 0    Prenatal Vit-Fe Fumarate-FA (MULTIVITAMIN-PRENATAL) 27-0.8 MG TABS tablet Take 1 tablet by mouth daily at 12 noon.      ULTICARE INSULIN SYRINGE 30G X 1/2" 0.3 ML MISC See admin instructions. with insulin (Patient not taking: Reported on 10/28/2020)        Review of Systems   All systems reviewed and negative except as stated in HPI  Blood pressure 127/85, pulse (!) 105, temperature 99.3 F (37.4 C), temperature source Oral, resp. rate 18, height _0  (1.6 m), weight 136 kg, last menstrual period 08/08/2020. General appearance: alert, cooperative, and no distress Lungs: Normal WOB Heart: regular rate and rhythm Abdomen: soft, non-tender Pelvic: NEFG  Extremities: Homans sign is negative, no sign of DVT   Prenatal labs: ABO, Rh: --/--/A POS (01/05 0801) Antibody: NEG (01/05 0801) Rubella: <0.90 (09/21 1048) RPR: NON REACTIVE (01/05 0801)  HBsAg: Negative (09/21 1048)  HIV: Non Reactive (09/21 1048)    Genetic screening LR NIPS  Anatomy US abnormal see above   Prenatal Transfer Tool  Maternal Diabetes: Yes:  Diabetes Type:  Pre-pregnancy, Insulin/Medication controlled Genetic Screening: Abnormal:  Results: Elevated AFP Maternal Ultrasounds/Referrals: Other: Fetal Ultrasounds or other Referrals:  Shortened femursNone Maternal Substance Abuse:  No Significant Maternal Medications:  None Significant Maternal Lab Results: None  Results for orders placed  or performed during the hospital encounter of 02/02/21 (from the past 24 hour(s))  Resp Panel by RT-PCR (Flu A&B, Covid) Nasopharyngeal Swab   Collection Time: 02/02/21  7:46 AM   Specimen: Nasopharyngeal Swab; Nasopharyngeal(NP) swabs in vial transport medium  Result Value Ref Range   SARS Coronavirus 2 by RT PCR NEGATIVE NEGATIVE   Influenza A by PCR NEGATIVE NEGATIVE   Influenza B by PCR NEGATIVE NEGATIVE  CBC   Collection Time: 02/02/21  8:01 AM  Result Value Ref Range   WBC 12.8 (H) 4.0 - 10.5 K/uL   RBC 4.37 3.87 - 5.11 MIL/uL   Hemoglobin 8.5 (L) 12.0 - 15.0 g/dL   HCT 29.8 (L) 36.0 - 46.0 %   MCV 68.2 (L) 80.0 - 100.0 fL   MCH 19.5 (L) 26.0 - 34.0 pg   MCHC 28.5 (L) 30.0 - 36.0 g/dL   RDW 22.2 (H) 11.5 - 15.5 %   Platelets 392 150 - 400 K/uL   nRBC 0.0 0.0 - 0.2 %  RPR   Collection Time: 02/02/21  8:01 AM  Result Value Ref Range   RPR Ser Ql NON REACTIVE NON REACTIVE  Type and screen   Collection Time: 02/02/21  8:01 AM  Result Value Ref Range   ABO/RH(D) A POS    Antibody Screen NEG    Sample Expiration      02/05/2021,2359 Performed at [facility], [address], [place], [place] [address]   Glucose, capillary   Collection Time: 02/02/21 11:36 AM  Result Value Ref Range   Glucose-Capillary 109 (H) 70 - 99 mg/dL   Comment 1 Document in Chart     Patient Active Problem List   Diagnosis Date Noted   IUFD at 20 weeks or more of gestation 02/02/2021   Chronic  hypertension in pregnancy 01/14/2021   Insulin-requiring or dependent type II diabetes mellitus ([place]) 01/14/2021   Chronic pulmonary embolism without acute cor pulmonale (HCC) 01/14/2021   Mixed hyperlipidemia 01/14/2021   Health education/counseling 01/14/2021   IUGR (intrauterine growth restriction) affecting care of mother 12/08/2020   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   S/P IVC filter 12/05/2020   Elevated AFP 11/24/2020   Low fetal fraction on Panorama at 10 wks 11/21/2020   Anticoagulation  in pregnancy 10/20/2020   Anemia in pregnancy 10/20/2020   Rubella non-immune status, antepartum 10/20/2020   Supervision of high risk pregnancy, antepartum 10/19/2020   Pre-existing type 2 diabetes mellitus during pregnancy, antepartum 10/19/2020   AMA (advanced maternal age) multigravida 35+, first trimester 10/19/2020   Allergies 01/14/2020   Abnormal chest x-ray 12/28/2019   History of COVID-19 11/25/2019   Type 2 diabetes mellitus without complication, without long-term current use of insulin ([place]) 11/25/2019   History of recurrent deep vein thrombosis (DVT) 11/25/2019   Diabetes type 2, 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 ([place]) 03/28/2017   Essential hypertension, benign 03/20/2017   Obesity in pregnancy 03/20/2017   Recurrent pulmonary embolism ([place]) 03/20/2017   BMI 50.0-59.9, adult ([place]) 02/28/2017    Assessment/Plan:  Virginia Fitzgerald is a 36 y.o. [id number] at 62w3dhere for IOL due to IUFD.   #Labor: Start with buccal cytotec 200 mcg q4, can increase to 4040m if tolerating well. She would like to hold her infant after delivery. She is interested with genetic screening. She will have her significant other present for labor, provided supportive listening. Offered chaplain services, declines for now.   #Pain: PRN, aware of options.  #MOC: Undecided   #T2DM: CBG 109 on admit. Will cont to monitor q4, she has an omnipod and will dose if needed.   #Chronic Hypertension: BP WNL. Cont home procardia and labetalol.   #Microcytic anemia: Likely iron deficiency anemia, hgb 8.5 on admit. Will give IV venofer.    #History of PE/DVT: Has been on prophylactic Lovenox 7054mID. Currently holding for today.    SamPatriciaann ClanO  02/02/2021, 1:40 PM

## 2021-02-03 ENCOUNTER — Encounter (HOSPITAL_COMMUNITY): Payer: Self-pay | Admitting: Obstetrics & Gynecology

## 2021-02-03 ENCOUNTER — Encounter (HOSPITAL_COMMUNITY)
Admission: AD | Disposition: A | Discharge status: Home / Self Care | Payer: Self-pay | Source: Home / Self Care | Attending: Obstetrics & Gynecology

## 2021-02-03 DIAGNOSIS — O364XX0 Maternal care for intrauterine death, not applicable or unspecified: Principal | ICD-10-CM

## 2021-02-03 DIAGNOSIS — O364XX1 Maternal care for intrauterine death, fetus 1: Secondary | ICD-10-CM

## 2021-02-03 DIAGNOSIS — O1002 Pre-existing essential hypertension complicating childbirth: Secondary | ICD-10-CM

## 2021-02-03 DIAGNOSIS — Z3A25 25 weeks gestation of pregnancy: Secondary | ICD-10-CM

## 2021-02-03 DIAGNOSIS — O24424 Gestational diabetes mellitus in childbirth, insulin controlled: Secondary | ICD-10-CM

## 2021-02-03 HISTORY — PX: DILATION AND EVACUATION: SHX1459

## 2021-02-03 LAB — CBC
HCT: 26.7 % — ABNORMAL LOW (ref 36.0–46.0)
HCT: 28.1 % — ABNORMAL LOW (ref 36.0–46.0)
HCT: 25.4 % — ABNORMAL LOW (ref 36.0–46.0)
Hemoglobin: 8 g/dL — ABNORMAL LOW (ref 12.0–15.0)
Hemoglobin: 8.4 g/dL — ABNORMAL LOW (ref 12.0–15.0)
Hemoglobin: 8 g/dL — ABNORMAL LOW (ref 12.0–15.0)
MCH: 19.6 pg — ABNORMAL LOW (ref 26.0–34.0)
MCH: 21.4 pg — ABNORMAL LOW (ref 26.0–34.0)
MCH: 21.9 pg — ABNORMAL LOW (ref 26.0–34.0)
MCHC: 30 g/dL (ref 30.0–36.0)
MCHC: 29.9 g/dL — ABNORMAL LOW (ref 30.0–36.0)
MCHC: 31.5 g/dL (ref 30.0–36.0)
MCV: 65.4 fL — ABNORMAL LOW (ref 80.0–100.0)
MCV: 71.7 fL — ABNORMAL LOW (ref 80.0–100.0)
MCV: 69.6 fL — ABNORMAL LOW (ref 80.0–100.0)
Platelets: 325 10*3/uL (ref 150–400)
Platelets: 339 10*3/uL (ref 150–400)
Platelets: 309 10*3/uL (ref 150–400)
RBC: 4.08 MIL/uL (ref 3.87–5.11)
RBC: 3.92 MIL/uL (ref 3.87–5.11)
RBC: 3.65 MIL/uL — ABNORMAL LOW (ref 3.87–5.11)
RDW: 21.7 % — ABNORMAL HIGH (ref 11.5–15.5)
RDW: 24.7 % — ABNORMAL HIGH (ref 11.5–15.5)
RDW: 23.5 % — ABNORMAL HIGH (ref 11.5–15.5)
WBC: 14.9 10*3/uL — ABNORMAL HIGH (ref 4.0–10.5)
WBC: 17.4 10*3/uL — ABNORMAL HIGH (ref 4.0–10.5)
WBC: 15.8 10*3/uL — ABNORMAL HIGH (ref 4.0–10.5)
nRBC: 0.1 % (ref 0.0–0.2)
nRBC: 0.1 % (ref 0.0–0.2)
nRBC: 0.1 % (ref 0.0–0.2)

## 2021-02-03 LAB — GLUCOSE, CAPILLARY
Glucose-Capillary: 108 mg/dL — ABNORMAL HIGH (ref 70–99)
Glucose-Capillary: 111 mg/dL — ABNORMAL HIGH (ref 70–99)
Glucose-Capillary: 117 mg/dL — ABNORMAL HIGH (ref 70–99)

## 2021-02-03 LAB — POSTPARTUM HEMORRHAGE PROTOCOL (BB NOTIFICATION)

## 2021-02-03 LAB — DIC (DISSEMINATED INTRAVASCULAR COAGULATION)PANEL
D-Dimer, Quant: 1.65 ug/mL-FEU — ABNORMAL HIGH (ref 0.00–0.50)
Fibrinogen: 548 mg/dL — ABNORMAL HIGH (ref 210–475)
INR: 1 (ref 0.8–1.2)
Platelets: 460 10*3/uL — ABNORMAL HIGH (ref 150–400)
Prothrombin Time: 13.7 seconds (ref 11.4–15.2)
Smear Review: NONE SEEN
aPTT: 27 seconds (ref 24–36)

## 2021-02-03 LAB — PREPARE RBC (CROSSMATCH)

## 2021-02-03 SURGERY — DILATION AND EVACUATION, UTERUS
Anesthesia: Epidural

## 2021-02-03 MED ORDER — PHENYLEPHRINE HCL-NACL 20-0.9 MG/250ML-% IV SOLN
INTRAVENOUS | Status: DC | PRN
Start: 2021-02-03 — End: 2021-02-03
  Administered 2021-02-03: 80 ug/min via INTRAVENOUS

## 2021-02-03 MED ORDER — SODIUM CHLORIDE 0.9 % IV SOLN
100.0000 mg | INTRAVENOUS | Status: DC
  Filled 2021-02-03: qty 100

## 2021-02-03 MED ORDER — ALBUMIN HUMAN 5 % IV SOLN
INTRAVENOUS | Status: AC
  Filled 2021-02-03: qty 250

## 2021-02-03 MED ORDER — DEXMEDETOMIDINE (PRECEDEX) IN NS 20 MCG/5ML (4 MCG/ML) IV SYRINGE
PREFILLED_SYRINGE | INTRAVENOUS | Status: DC | PRN
  Administered 2021-02-03: 20 ug via INTRAVENOUS

## 2021-02-03 MED ORDER — TRANEXAMIC ACID-NACL 1000-0.7 MG/100ML-% IV SOLN
INTRAVENOUS | Status: DC | PRN
Start: 2021-02-03 — End: 2021-02-03
  Administered 2021-02-03: 1000 mg via INTRAVENOUS

## 2021-02-03 MED ORDER — ACETAMINOPHEN 325 MG PO TABS: 650.0000 mg | ORAL_TABLET | ORAL | Status: DC | PRN

## 2021-02-03 MED ORDER — SIMETHICONE 80 MG PO CHEW
80.0000 mg | CHEWABLE_TABLET | ORAL | Status: DC | PRN
  Filled 2021-02-03: qty 1

## 2021-02-03 MED ORDER — LIDOCAINE-EPINEPHRINE (PF) 2 %-1:200000 IJ SOLN
INTRAMUSCULAR | Status: AC
  Filled 2021-02-03: qty 20

## 2021-02-03 MED ORDER — ZOLPIDEM TARTRATE 5 MG PO TABS: 5.0000 mg | ORAL_TABLET | Freq: Every evening | ORAL | Status: DC | PRN

## 2021-02-03 MED ORDER — DEXMEDETOMIDINE (PRECEDEX) IN NS 20 MCG/5ML (4 MCG/ML) IV SYRINGE
PREFILLED_SYRINGE | INTRAVENOUS | Status: AC
  Filled 2021-02-03: qty 5

## 2021-02-03 MED ORDER — TRANEXAMIC ACID-NACL 1000-0.7 MG/100ML-% IV SOLN
1000.0000 mg | INTRAVENOUS | Status: AC
  Administered 2021-02-03: 1000 mg via INTRAVENOUS

## 2021-02-03 MED ORDER — WITCH HAZEL-GLYCERIN EX PADS: 1.0000 "application " | MEDICATED_PAD | CUTANEOUS | Status: DC | PRN

## 2021-02-03 MED ORDER — IBUPROFEN 600 MG PO TABS: 600.0000 mg | ORAL_TABLET | Freq: Four times a day (QID) | ORAL | Status: DC

## 2021-02-03 MED ORDER — LACTATED RINGERS IV SOLN: INTRAVENOUS | Status: DC

## 2021-02-03 MED ORDER — BENZOCAINE-MENTHOL 20-0.5 % EX AERO
1.0000 "application " | INHALATION_SPRAY | CUTANEOUS | Status: DC | PRN
  Filled 2021-02-03: qty 56

## 2021-02-03 MED ORDER — DIPHENHYDRAMINE HCL 25 MG PO CAPS: 25.0000 mg | ORAL_CAPSULE | Freq: Four times a day (QID) | ORAL | Status: DC | PRN

## 2021-02-03 MED ORDER — PRENATAL MULTIVITAMIN CH
1.0000 | ORAL_TABLET | Freq: Every day | ORAL | Status: DC
  Administered 2021-02-03: 1 via ORAL
  Filled 2021-02-03: qty 1

## 2021-02-03 MED ORDER — OXYTOCIN-SODIUM CHLORIDE 30-0.9 UT/500ML-% IV SOLN
INTRAVENOUS | Status: DC | PRN
  Administered 2021-02-03: 100 mL via INTRAVENOUS
  Administered 2021-02-03: 200 mL via INTRAVENOUS

## 2021-02-03 MED ORDER — LIDOCAINE HCL 1 % IJ SOLN
INTRAMUSCULAR | Status: AC
  Filled 2021-02-03: qty 20

## 2021-02-03 MED ORDER — SCOPOLAMINE 1 MG/3DAYS TD PT72
MEDICATED_PATCH | TRANSDERMAL | Status: DC | PRN
  Administered 2021-02-03: 1 via TRANSDERMAL

## 2021-02-03 MED ORDER — OXYCODONE-ACETAMINOPHEN 5-325 MG PO TABS: 1.0000 | ORAL_TABLET | ORAL | Status: DC | PRN

## 2021-02-03 MED ORDER — LACTATED RINGERS IV BOLUS: 1000.0000 mL | Freq: Once | INTRAVENOUS | Status: DC

## 2021-02-03 MED ORDER — OXYTOCIN-SODIUM CHLORIDE 30-0.9 UT/500ML-% IV SOLN: 1.0000 m[IU]/min | INTRAVENOUS | Status: DC

## 2021-02-03 MED ORDER — SIMETHICONE 80 MG PO CHEW: 80.0000 mg | CHEWABLE_TABLET | ORAL | Status: DC | PRN

## 2021-02-03 MED ORDER — ALBUMIN HUMAN 5 % IV SOLN: INTRAVENOUS | Status: DC | PRN

## 2021-02-03 MED ORDER — SODIUM CHLORIDE 0.9% IV SOLUTION: Freq: Once | INTRAVENOUS | Status: DC

## 2021-02-03 MED ORDER — ONDANSETRON HCL 4 MG PO TABS: 4.0000 mg | ORAL_TABLET | ORAL | Status: DC | PRN

## 2021-02-03 MED ORDER — LIDOCAINE-EPINEPHRINE (PF) 2 %-1:200000 IJ SOLN
INTRAMUSCULAR | Status: DC | PRN
  Administered 2021-02-03: 10 mL via EPIDURAL

## 2021-02-03 MED ORDER — TETANUS-DIPHTH-ACELL PERTUSSIS 5-2.5-18.5 LF-MCG/0.5 IM SUSY: 0.5000 mL | PREFILLED_SYRINGE | Freq: Once | INTRAMUSCULAR | Status: DC

## 2021-02-03 MED ORDER — IBUPROFEN 600 MG PO TABS
600.0000 mg | ORAL_TABLET | Freq: Four times a day (QID) | ORAL | Status: DC
  Administered 2021-02-03 (×2): 600 mg via ORAL
  Filled 2021-02-03 (×2): qty 1

## 2021-02-03 MED ORDER — FENTANYL CITRATE (PF) 100 MCG/2ML IJ SOLN
INTRAMUSCULAR | Status: AC
  Filled 2021-02-03: qty 2

## 2021-02-03 MED ORDER — PHENYLEPHRINE 40 MCG/ML (10ML) SYRINGE FOR IV PUSH (FOR BLOOD PRESSURE SUPPORT)
PREFILLED_SYRINGE | INTRAVENOUS | Status: AC
  Filled 2021-02-03: qty 10

## 2021-02-03 MED ORDER — SENNOSIDES-DOCUSATE SODIUM 8.6-50 MG PO TABS
2.0000 | ORAL_TABLET | Freq: Every day | ORAL | Status: DC
  Filled 2021-02-03: qty 2

## 2021-02-03 MED ORDER — TRANEXAMIC ACID-NACL 1000-0.7 MG/100ML-% IV SOLN
INTRAVENOUS | Status: AC
  Filled 2021-02-03: qty 100

## 2021-02-03 MED ORDER — IBUPROFEN 600 MG PO TABS: 600.0000 mg | ORAL_TABLET | Freq: Four times a day (QID) | ORAL | 1 refills | Status: DC

## 2021-02-03 MED ORDER — PHENYLEPHRINE HCL (PRESSORS) 10 MG/ML IV SOLN
INTRAVENOUS | Status: DC | PRN
  Administered 2021-02-03: 160 ug via INTRAVENOUS
  Administered 2021-02-03 (×2): 120 ug via INTRAVENOUS

## 2021-02-03 MED ORDER — BENZOCAINE-MENTHOL 20-0.5 % EX AERO: 1.0000 "application " | INHALATION_SPRAY | CUTANEOUS | Status: DC | PRN

## 2021-02-03 MED ORDER — INSULIN PEN NEEDLE 30G X 8 MM MISC
1.0000 | 2 refills | Status: DC | PRN
Start: 2021-02-03 — End: 2023-09-08

## 2021-02-03 MED ORDER — INSULIN DETEMIR 100 UNIT/ML FLEXPEN: 20.0000 [IU] | PEN_INJECTOR | Freq: Every day | SUBCUTANEOUS | 1 refills | Status: DC

## 2021-02-03 MED ORDER — ONDANSETRON HCL 4 MG/2ML IJ SOLN: 4.0000 mg | INTRAMUSCULAR | Status: DC | PRN

## 2021-02-03 MED ORDER — TETANUS-DIPHTH-ACELL PERTUSSIS 5-2.5-18.5 LF-MCG/0.5 IM SUSY
0.5000 mL | PREFILLED_SYRINGE | Freq: Once | INTRAMUSCULAR | Status: DC
  Filled 2021-02-03: qty 0.5

## 2021-02-03 MED ORDER — ACETAMINOPHEN 325 MG PO TABS: 650.0000 mg | ORAL_TABLET | ORAL | 1 refills | Status: AC | PRN

## 2021-02-03 MED ORDER — FENTANYL CITRATE (PF) 100 MCG/2ML IJ SOLN
INTRAMUSCULAR | Status: DC | PRN
  Administered 2021-02-03: 100 ug via INTRAVENOUS

## 2021-02-03 MED ORDER — LIDOCAINE 2% (20 MG/ML) 5 ML SYRINGE
INTRAMUSCULAR | Status: AC
  Filled 2021-02-03: qty 5

## 2021-02-03 MED ORDER — INSULIN PUMP
Freq: Three times a day (TID) | SUBCUTANEOUS | Status: DC
  Filled 2021-02-03: qty 1

## 2021-02-03 MED ORDER — LACTATED RINGERS IV SOLN
INTRAVENOUS | Status: DC | PRN
Start: 2021-02-03 — End: 2021-02-03

## 2021-02-03 MED ORDER — OXYCODONE HCL 5 MG PO TABS
5.0000 mg | ORAL_TABLET | ORAL | 0 refills | Status: DC | PRN
Start: 2021-02-03 — End: 2022-04-20

## 2021-02-03 MED ORDER — DEXTROSE 5 % IV SOLN
200.0000 mg | INTRAVENOUS | Status: DC
  Filled 2021-02-03: qty 200

## 2021-02-03 MED ORDER — PHENYLEPHRINE HCL-NACL 20-0.9 MG/250ML-% IV SOLN
INTRAVENOUS | Status: AC
  Filled 2021-02-03: qty 250

## 2021-02-03 MED ORDER — SODIUM CHLORIDE 0.9 % IV SOLN
INTRAVENOUS | Status: DC | PRN
  Administered 2021-02-03 (×2): 100 mg via INTRAVENOUS

## 2021-02-03 MED ORDER — ONDANSETRON HCL 4 MG/2ML IJ SOLN
INTRAMUSCULAR | Status: DC | PRN
Start: 2021-02-03 — End: 2021-02-03
  Administered 2021-02-03: 4 mg via INTRAVENOUS

## 2021-02-03 MED ORDER — ONDANSETRON HCL 4 MG/2ML IJ SOLN
INTRAMUSCULAR | Status: AC
  Filled 2021-02-03: qty 2

## 2021-02-03 MED ORDER — DIBUCAINE (PERIANAL) 1 % EX OINT
1.0000 "application " | TOPICAL_OINTMENT | CUTANEOUS | Status: DC | PRN
  Filled 2021-02-03: qty 28

## 2021-02-03 MED ORDER — NOVOLOG FLEXPEN 100 UNIT/ML ~~LOC~~ SOPN: 0.0000 [IU] | PEN_INJECTOR | Freq: Three times a day (TID) | SUBCUTANEOUS | 1 refills | Status: DC

## 2021-02-03 MED ORDER — OXYCODONE-ACETAMINOPHEN 5-325 MG PO TABS: 2.0000 | ORAL_TABLET | ORAL | Status: DC | PRN

## 2021-02-03 MED ORDER — DIBUCAINE (PERIANAL) 1 % EX OINT: 1.0000 "application " | TOPICAL_OINTMENT | CUTANEOUS | Status: DC | PRN

## 2021-02-03 MED ORDER — COCONUT OIL OIL: 1.0000 "application " | TOPICAL_OIL | Status: DC | PRN

## 2021-02-03 MED ORDER — PRENATAL MULTIVITAMIN CH
1.0000 | ORAL_TABLET | Freq: Every day | ORAL | Status: DC
  Filled 2021-02-03: qty 1

## 2021-02-03 MED ORDER — SCOPOLAMINE 1 MG/3DAYS TD PT72
MEDICATED_PATCH | TRANSDERMAL | Status: AC
  Filled 2021-02-03: qty 1

## 2021-02-03 MED ORDER — SENNOSIDES-DOCUSATE SODIUM 8.6-50 MG PO TABS
2.0000 | ORAL_TABLET | ORAL | Status: DC
  Administered 2021-02-03: 2 via ORAL
  Filled 2021-02-03: qty 2

## 2021-02-03 SURGICAL SUPPLY — 21 items
CATH ROBINSON RED A/P 16FR (CATHETERS) ×2 IMPLANT
CLOTH BEACON ORANGE TIMEOUT ST (SAFETY) ×2 IMPLANT
DECANTER SPIKE VIAL GLASS SM (MISCELLANEOUS) IMPLANT
GLOVE BIO SURGEON STRL SZ 6.5 (GLOVE) ×2 IMPLANT
GLOVE BIOGEL PI IND STRL 7.0 (GLOVE) ×2 IMPLANT
GLOVE BIOGEL PI INDICATOR 7.0 (GLOVE) ×2
GOWN STRL REUS W/TWL LRG LVL3 (GOWN DISPOSABLE) ×4 IMPLANT
HOVERMATT SINGLE USE (MISCELLANEOUS) ×1 IMPLANT
KIT BERKELEY 1ST TRIMESTER 3/8 (MISCELLANEOUS) IMPLANT
NS IRRIG 1000ML POUR BTL (IV SOLUTION) ×2 IMPLANT
PACK VAGINAL MINOR WOMEN LF (CUSTOM PROCEDURE TRAY) ×2 IMPLANT
PAD OB MATERNITY 4.3X12.25 (PERSONAL CARE ITEMS) ×2 IMPLANT
PAD PREP 24X48 CUFFED NSTRL (MISCELLANEOUS) ×2 IMPLANT
SET BERKELEY SUCTION TUBING (SUCTIONS) ×1 IMPLANT
SPONGE T-LAP 18X18 ~~LOC~~+RFID (SPONGE) ×2 IMPLANT
TOWEL OR 17X24 6PK STRL BLUE (TOWEL DISPOSABLE) ×4 IMPLANT
VACURETTE 10 RIGID CVD (CANNULA) IMPLANT
VACURETTE 16MM ASPIR CVD .5 (CANNULA) ×1 IMPLANT
VACURETTE 7MM CVD STRL WRAP (CANNULA) IMPLANT
VACURETTE 8 RIGID CVD (CANNULA) IMPLANT
VACURETTE 9 RIGID CVD (CANNULA) IMPLANT

## 2021-02-03 NOTE — Progress Notes (Addendum)
Inpatient Diabetes Program Recommendations  AACE/ADA: New Consensus Statement on Inpatient Glycemic Control (2015)  Target Ranges:  Prepandial:   less than 140 mg/dL      Peak postprandial:   less than 180 mg/dL (1-2 hours)      Critically ill patients:  140 - 180 mg/dL   Lab Results  Component Value Date   GLUCAP 117 (H) 02/03/2021   HGBA1C 7.5 (H) 10/19/2020    Diabetes history: Type 2 DM Outpatient Diabetes medications: Omnipod- Novolog Current orders for Inpatient glycemic control: Insulin pump  Inpatient Diabetes Program Recommendations:    Met with patient and insulin pump has been removed due to battery needing to be charged. Cannot remember insulin pump settings and Care everywhere and chart review has undocumented insulin pump settings. Additionally, patient cannot remember what insulins she was taking prior to pregnancy; however can demonstrate how to use insulin pens.  Patient to discharge home. Reviewed concern for having hypoglycemia with current insulin pump rates. Patient has not been bolusing which is unusual for her because she had been having low blood sugars in the pregnancy.   Stressed the importance of following up with PCP and OB team postpartum for blood sugar management. Is in agreement with plan below and has no further questions.  Consider: -Levemir 20 units QHS per Flexpen- #[id number] -insulin pen needles #[id number] -Novolog 0-9 units TID per sliding scale Flexpen- #[id number] Discussed with Dr Damita Dunnings  Thanks, Bronson Curb, MSN, RNC-OB Diabetes Coordinator [phone] (8a-5p)

## 2021-02-03 NOTE — Progress Notes (Signed)
Pt given discharge instructions and pt verbalized understanding. All questions answered. IV discontinued.

## 2021-02-03 NOTE — Transfer of Care (Signed)
Immediate Anesthesia Transfer of Care Note  Patient: Virginia Fitzgerald  Procedure(s) Performed: DILATATION AND EVACUATION  Patient Location: PACU  Anesthesia Type:Epidural  Level of Consciousness: awake, alert  and oriented  Airway & Oxygen Therapy: Patient Spontanous Breathing  Post-op Assessment: Report given to RN and Post -op Vital signs reviewed and stable  Post vital signs: Reviewed and stable  Last Vitals:  Vitals Value Taken Time  BP    Temp    Pulse    Resp    SpO2      Last Pain:  Vitals:   02/03/21 0145  TempSrc:   PainSc: Asleep         Complications: No notable events documented.

## 2021-02-03 NOTE — Op Note (Signed)
Virginia Fitzgerald PROCEDURE DATE: 02/03/2021  PREOPERATIVE DIAGNOSIS: 25 week fetal demise with retained placenta POSTOPERATIVE DIAGNOSIS: The same PROCEDURE:     Postpartum curettage SURGEON:  Emeterio Reeve MD  INDICATIONS: 36 y.o. [phone] with retained placenta after delivery of stillborn infant at [redacted]w[redacted]d weeks gestation, needing surgical completion.  Risks of surgery were discussed with the patient including but not limited to: bleeding which may require transfusion; infection which may require antibiotics; injury to uterus or surrounding organs; need for additional procedures including laparotomy or laparoscopy; possibility of intrauterine scarring which may impair future fertility; and other postoperative/anesthesia complications. Written informed consent was obtained.    FINDINGS:  A 12 week size uterus, large amount of products of conception, specimen sent to pathology.  ANESTHESIA:    Monitored intravenous sedation, epidural INTRAVENOUS FLUIDS:  1000 ml of LR ESTIMATED BLOOD LOSS:  Less than 100 ml. SPECIMENS:  Products of conception sent to pathology COMPLICATIONS:  None immediate.  PROCEDURE DETAILS:  The patient received intravenous Doxycycline while in the preoperative area.  She was then taken to the operating room where monitored intravenous sedation was administered and was found to be adequate.  After an adequate timeout was performed, she was placed in the dorsal lithotomy position and examined; then prepped and draped in the sterile manner.   Her bladder was catheterized for an unmeasured amount of clear, yellow urine. A vaginal speculum was then placed in the patient's vagina and ring forceps was applied to the anterior lip of the cervix.  The cervix was already dilated to accommodate a 16 mm suction curette that was gently advanced to the uterine fundus.  The suction device was then activated and curette slowly rotated to clear the uterus of products of conception.  There was  minimal bleeding noted and the tenaculum removed with good hemostasis noted.   All instruments were removed from the patient's vagina.  Sponge and instrument counts were correct times two  The patient tolerated the procedure well and was taken to the recovery area awake, and in stable condition.   Woodroe Mode, MD 02/03/2021] 3:19 AM

## 2021-02-03 NOTE — Anesthesia Postprocedure Evaluation (Signed)
Anesthesia Post Note  Patient: Virginia Fitzgerald  Procedure(s) Performed: DILATATION AND EVACUATION     Patient location during evaluation: PACU Anesthesia Type: Epidural Level of consciousness: awake and alert Pain management: pain level controlled Vital Signs Assessment: post-procedure vital signs reviewed and stable Respiratory status: spontaneous breathing, nonlabored ventilation and respiratory function stable Cardiovascular status: stable Postop Assessment: no headache, no backache, epidural receding and no apparent nausea or vomiting Anesthetic complications: no   No notable events documented.  Last Vitals:  Vitals:   02/03/21 0545 02/03/21 0635  BP:  (!) 142/80  Pulse:  98  Resp:  20  Temp:  36.8 C  SpO2: 99% 96%    Last Pain:  Vitals:   02/03/21 0635  TempSrc: Oral  PainSc:    Pain Goal:                   Pervis Hocking

## 2021-02-03 NOTE — Progress Notes (Signed)
Pt has chosen [facility] for her baby.

## 2021-02-03 NOTE — Discharge Summary (Signed)
Postpartum Discharge Summary  Date of Service updated     Patient Name: Virginia Fitzgerald DOB: [birth year] MRN: [id number]  Date of admission: 02/02/2021 Delivery date:02/03/2021  Delivering provider: Seabron Spates  Date of discharge: 02/03/2021  Admitting diagnosis: IUFD at 63 weeks or more of gestation [O36.4XX0] Intrauterine pregnancy: [redacted]w[redacted]d    Secondary diagnosis:  Principal Problem:   IUFD at 278 weeksor more of gestation  Additional problems: Fetal anomalies, IUGR, Retained Placenta, Postpartum hemorrhage, History of PE/DVT, Type 2 diabetes, Chronic Hypertension   Discharge diagnosis: Preterm Pregnancy Delivered and IUFD, IUGR, Fetal anomalies, Retained placenta, Postpartum Hemorrhage,                                                Post partum procedures:blood transfusion and curettage  Augmentation: Cytotec Complications: [id number] Hospital course: Induction of Labor With Vaginal Delivery   36y.o. yo [id number] admitted to the hospital 02/02/2021 for induction of labor.  Indication for induction:  Intrauterine Fetal  Demise .  Patient had an uncomplicated labor course as follows: Induced labor with Cytotec. She then had a precipitous Vaginal delivery complicated by retained placenta and postpartum hemorrhage requiring D&C. She was then stable following this.  Membrane Rupture Time/Date:  ,   Delivery Method:Vaginal, Spontaneous  Episiotomy: None  Lacerations:  None  Details of delivery can be found in separate delivery note.  [facility]  routine [facility] course. Patient is discharged home 02/03/21 per her request. She and I spoke at length about her course leading up to this event and the high likelihood that if not something genetic, that it would be related to the placenta. 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 We discussed that this was not related to her other medical conditions as those were considered well  controlled during her pregnancy. We discussed anora takes up to 4 weeks to come back and will provide usKoreaome guidance on potential cause. We discussed most genetic abnormalities are de novo and thus not at high risk for recurrence. We discussed potential management for future pregnancy if desired. She declines birth control at this time, but reports this pregnancy was unplanned. We did review progesterone only options given her history of VTE.   She will follow up with PCP for her chronic medical conditions. I encouraged lovenox for one month post delivery while still bleeding but at that time she may consider resuming her xarelto.   All questions answered for the patient for discharge and home medications reviewed.   Newborn Data: Birth date:02/03/2021  Birth time:2:00 AM  Gender:Female  Living status:Fetal Demise  Apgars:0 ,0  Weight:360 g   Magnesium Sulfate received: No BMZ received: No Rhophylac:N/A MMR:No T-DaP: n/a Flu: No Transfusion:Yes  Physical exam  Vitals:   02/03/21 0635 02/03/21 0756 02/03/21 1125 02/03/21 1508  BP: (!) 142/80 107/60 125/77 132/88  Pulse: 98 (!) 101 100 (!) 111  Resp: '20 18 18 18  ' Temp: 98.2 F (36.8 C) 98.2 F (36.8 C) 97.9 F (36.6 C) 97.7 F (36.5 C)  TempSrc: Oral Oral Oral Oral  SpO2: 96% 96% 94% 97%  Weight:      Height:       General: alert, cooperative, and no distress Lochia: appropriate Uterine Fundus: firm Incision: N/A DVT Evaluation:  No evidence of DVT seen on physical exam. No cords or calf tenderness.  Labs: Lab Results  Component Value Date   WBC 15.8 (H) 02/03/2021   HGB 8.0 (L) 02/03/2021   HCT 25.4 (L) 02/03/2021   MCV 69.6 (L) 02/03/2021   PLT 309 02/03/2021   CMP Latest Ref Rng & Units 01/09/2021  Glucose 70 - 99 mg/dL 134(H)  BUN 6 - 20 mg/dL 7  Creatinine 0.44 - 1.00 mg/dL 0.71  Sodium 135 - 145 mmol/L 133(L)  Potassium 3.5 - 5.1 mmol/L 3.9  Chloride 98 - 111 mmol/L 101  CO2 22 - 32 mmol/L 21(L)  Calcium  8.9 - 10.3 mg/dL 9.4  Total Protein 6.5 - 8.1 g/dL 7.8  Total Bilirubin 0.3 - 1.2 mg/dL 0.4  Alkaline Phos 38 - 126 U/L 413(H)  AST 15 - 41 U/L 18  ALT 0 - 44 U/L 24   Edinburgh Score: No flowsheet data found.    After visit meds:  Allergies as of 02/03/2021   No Known Allergies      Medication List     STOP taking these medications    Accu-Chek FastClix Lancets Misc   Accu-Chek Guide test strip Generic drug: glucose blood   Accu-Chek Guide w/Device Kit   aspirin EC 81 MG tablet   blood glucose meter kit and supplies   Blood Pressure Kit Devi   cyclobenzaprine 5 MG tablet Commonly known as: FLEXERIL   Dexcom G6 Receiver Devi   Dexcom G6 Sensor Misc   Dexcom G6 Transmitter Misc   furosemide 20 MG tablet Commonly known as: LASIX   Gojji Weight Scale Misc   insulin lispro 100 UNIT/ML injection Commonly known as: HumaLOG   multivitamin-prenatal 27-0.8 MG Tabs tablet   Omnipod DASH Intro (Gen 4) Kit   Omnipod DASH Pods (Gen 4) Misc   oxyCODONE-acetaminophen 5-325 MG tablet Commonly known as: Percocet   UltiCare Insulin Syringe 30G X 1/2" 0.3 ML Misc Generic drug: Insulin Syringe-Needle U-100       TAKE these medications    acetaminophen 325 MG tablet Commonly known as: Tylenol Take 2 tablets (650 mg total) by mouth every 4 (four) hours as needed (for pain scale < 4). What changed:  medication strength how much to take when to take this reasons to take this   cetirizine 10 MG tablet Commonly known as: ZyrTEC Allergy Take 1 tablet (10 mg total) by mouth daily.   enoxaparin 80 MG/0.8ML injection Commonly known as: LOVENOX Inject 0.7 mLs (70 mg total) into the skin every 12 (twelve) hours.   ferrous gluconate 324 MG tablet Commonly known as: FERGON Take 1 tablet (324 mg total) by mouth every other day.   ibuprofen 600 MG tablet Commonly known as: ADVIL Take 1 tablet (600 mg total) by mouth every 6 (six) hours.   insulin detemir 100  UNIT/ML FlexPen Commonly known as: LEVEMIR Inject 20 Units into the skin daily.   Insulin Pen Needle 30G X 8 MM Misc Commonly known as: NOVOFINE Inject 10 each into the skin as needed.   labetalol 200 MG tablet Commonly known as: NORMODYNE Take 1 tablet (200 mg total) by mouth 2 (two) times daily.   NIFEdipine 30 MG 24 hr tablet Commonly known as: ADALAT CC Take 1 tablet (30 mg total) by mouth daily.   NovoLOG FlexPen 100 UNIT/ML FlexPen Generic drug: insulin aspart Inject 0-9 Units into the skin 3 (three) times daily with meals.   oxyCODONE 5 MG immediate release tablet  Commonly known as: Oxy IR/ROXICODONE Take 1 tablet (5 mg total) by mouth every 4 (four) hours as needed for severe pain or breakthrough pain.       Message sent to her PCP, Dionisio David, NP for office to arrange short term follow up for her chronic medical conditions.   Message sent to [facility] to arrange postpartum follow up in 4 weeks.   Discharge home in stable condition Infant Feeding:  N/A Infant Susan Moore Discharge instruction: per After Visit Summary and Postpartum booklet. Activity: Advance as tolerated. Pelvic rest for 6 weeks.  Diet: routine diet Anticipated Birth Control: Unsure Postpartum Appointment:4 weeks Additional Postpartum F/U: Postpartum Depression checkup and BP check 1 week Future Appointments: Future Appointments  Date Time Provider [place]  02/10/2021  8:30 AM [facility] NURSE [facility]  02/20/2021  9:20 AM Tobb, Godfrey Pick, DO [facility]  03/03/2021  9:15 AM Kandace Blitz [facility]    02/03/2021 Radene Gunning, MD

## 2021-02-06 ENCOUNTER — Encounter: Payer: Medicaid Other | Admitting: Obstetrics and Gynecology

## 2021-02-06 LAB — TYPE AND SCREEN
ABO/RH(D): A POS
Antibody Screen: NEGATIVE
Unit division: 0
Unit division: 0
Unit division: 0
Unit division: 0

## 2021-02-06 LAB — BPAM RBC
Blood Product Expiration Date: 202301202359
Blood Product Expiration Date: 202301212359
Blood Product Expiration Date: 202301272359
Blood Product Expiration Date: 202301292359
ISSUE DATE / TIME: 202301060228
ISSUE DATE / TIME: 202301060228
Unit Type and Rh: 6200
Unit Type and Rh: 6200
Unit Type and Rh: 6200
Unit Type and Rh: 6200

## 2021-02-06 LAB — SURGICAL PATHOLOGY

## 2021-02-09 ENCOUNTER — Telehealth: Payer: Self-pay | Admitting: Lactation Services

## 2021-02-09 NOTE — Telephone Encounter (Signed)
Received PA request for Oxycodone following D&C. Patient now 6 days post surgery.   Called patient to assess pain. Patient reports she is not having pain at this time. Informed her I will cancel request for Oxycodone with the Pharmacy, she voiced understanding.   Patient reports she has  no questions or concerns at this time. She was informed that [place] [facility] is in the office if she needs to speak with someone.   Called [facility] on [address] and asked them to cancel patients prescription for Oxycodone.

## 2021-02-10 ENCOUNTER — Ambulatory Visit: Payer: Self-pay

## 2021-02-10 ENCOUNTER — Encounter: Payer: Self-pay | Admitting: *Deleted

## 2021-02-13 ENCOUNTER — Telehealth: Payer: Self-pay | Admitting: Genetics

## 2021-02-13 ENCOUNTER — Other Ambulatory Visit: Payer: Self-pay

## 2021-02-13 NOTE — Telephone Encounter (Signed)
Virginia Fitzgerald returned genetic counseling's phone call. We discussed her screen negative Vistara results and her normal Anora results.   Vistara screening (performed on a maternal blood sample) resulted as screen negative. Vistara screened Virginia Fitzgerald's pregnancy for single gene disorders such as Achondroplasia, Noonan syndrome, CHARGE syndrome, Rett syndrome, etc. We reviewed that a few targeted skeletal dysplasia conditions were included on the Vistara panel, however, this screening is not as comprehensive or diagnostic as a Skeletal Dysplasia Gene Sequencing panel that can be performed on a CVS/Amniocentesis/POC sample. The screen negative Vistara result reduces the chance that Virginia Fitzgerald's pregnancy was affected with the following conditions:   Anora testing (a microarray performed on POC) resulted as normal: arr(1-22)x2,(XY)x1. This indicates the laboratory did not detect any pathogenic or likely pathogenic copy number variants (CNVs). We reviewed that while this testing is diagnostic, it provides information that is different from Skeletal Dysplasia Gene Sequencing panels and not all skeletal dysplasia conditions can be ruled out.  Given these screen negative/normal results there is still a possibility of an underlying genetic condition. Recurrence risk for a future pregnancy is uncertain and there might be increased risk given that we were not able to identify the etiology of the ultrasound findings. Genetic counseling is recommended in [place] future pregnancies. Further genetic testing for a future pregnancy can be discussed at a later date.

## 2021-02-13 NOTE — Telephone Encounter (Signed)
Called Virginia Fitzgerald to express condolences for her loss and return screen negative Vistara and normal Anora testing results. Left a voicemail encouraging Virginia Fitzgerald to call genetic counseling back if/when she feels she is ready to discuss the results.

## 2021-02-14 ENCOUNTER — Telehealth: Payer: Self-pay | Admitting: Lactation Services

## 2021-02-14 NOTE — Telephone Encounter (Signed)
Called patient with results or Anora showing Normal female fetus. and Vistara showing screen was negative for Chromosomal abnormalities. Reviewed with patient with the Achondroplasia that was tested for was negative on the Vistara testing.   Patient asked about Placental testing, reviewed results that were seen in chart. She still has questions. Will reach out to Dr. Damita Dunnings to reach out to patient to discuss.

## 2021-02-15 NOTE — Telephone Encounter (Signed)
Spoke with Ms. Pasquarella reviewing her [name], genetics and pathology. We discussed that most likely this loss was due to placental dysfunction in light of normal genetics and due to the elevated MSAFP. We reviewed that ultrasound is not often able to identify placenta abnormalities and that is one of the benefits of the MSAFP. We discussed measures we take to reduce this risk including ldASA which she was on for her CHTN risk. We discussed that the growth restriction was showing at 17wks and at first did not show impact on HC/AC but by subsequent scan it did show this. We discussed that interval growth also was not as expected over those ultrasounds. We discussed that by the [name] it showed a normal posterior placenta with normal cord insertion. Reviewed that due to retained placenta, it came out in pieces and the pathology was thus limited for input.   We did discuss that I do not think her medical conditions likely caused this outcome given the early nature of the growth restriction. More likely it was again placental in nature.   We did discuss that while her medical conditions do increase the risk of still birth that is typically with poor control and would be more likely to happen in the third trimester. We did discuss that good control of her medical conditions would be best before TTC should she decide she would like to TTC again once cleared.   I answered all of her questions and invited her to contact us if she has any additional questions.   She has her f/u appt on 2/3 and will forward this message to [facility] for her review prior to the appointment.   Radene Gunning, MD Attending [place], [facility] for [facility], [place]

## 2021-02-16 ENCOUNTER — Telehealth (HOSPITAL_COMMUNITY): Payer: Self-pay | Admitting: *Deleted

## 2021-02-16 DIAGNOSIS — Z1331 Encounter for screening for depression: Secondary | ICD-10-CM

## 2021-02-16 NOTE — Telephone Encounter (Signed)
Attempted hospital discharge follow-up call. Left message for patient to return RN call. No EPDS noted in chart. RN completed referral to Integrated Behavioral [facility] due to no EPDS done in the hospital and patient's loss. Referral sent to Dr. Radene Gunning. Erline Levine, RN, 02/16/21, 1600

## 2021-02-17 ENCOUNTER — Encounter: Payer: Self-pay | Admitting: *Deleted

## 2021-02-17 ENCOUNTER — Ambulatory Visit: Payer: Medicaid Other

## 2021-02-20 ENCOUNTER — Ambulatory Visit: Payer: Medicaid Other | Admitting: Cardiology

## 2021-02-22 ENCOUNTER — Ambulatory Visit: Payer: Self-pay | Admitting: Nurse Practitioner

## 2021-03-03 ENCOUNTER — Ambulatory Visit: Payer: Self-pay | Admitting: Family

## 2021-03-03 NOTE — BH Specialist Note (Signed)
Pt did not arrive to video visit and did not answer the phone; Left HIPPA-compliant message to call back Justo Hengel from [facility] at  [phone] (Jagger Beahm's office).  ?; left MyChart message for patient.  ? ?

## 2021-03-06 ENCOUNTER — Encounter: Payer: Self-pay | Admitting: Obstetrics and Gynecology

## 2021-03-07 ENCOUNTER — Ambulatory Visit: Payer: Medicaid Other | Admitting: Clinical

## 2021-03-07 DIAGNOSIS — Z91199 Patient's noncompliance with other medical treatment and regimen due to unspecified reason: Secondary | ICD-10-CM

## 2021-03-13 NOTE — Progress Notes (Unsigned)
° ° °  [facility] Partum Visit Note  Virginia Fitzgerald is a 36 y.o. [id number] female who presents for a postpartum visit. She is 6 weeks postpartum following a normal spontaneous vaginal delivery.  I have fully reviewed the prenatal and intrapartum course. The delivery was at 25.4 gestational weeks.  Anesthesia: epidural. Postpartum course has been ***.  Bowel function is {normal:32111}. Bladder function is {normal:32111}. Patient {is/is not:9024} sexually active. Contraception method is {contraceptive method:5051}. Postpartum depression screening: {gen negative/positive:315881}.   The pregnancy intention screening data noted above was reviewed. Potential methods of contraception were discussed. The patient elected to proceed with No data recorded.    Health Maintenance Due  Topic Date Due   OPHTHALMOLOGY EXAM  Never done   INFLUENZA VACCINE  Never done   FOOT EXAM  12/09/2020   URINE MICROALBUMIN  12/09/2020    {Common ambulatory SmartLinks:19316}  Review of Systems {ros; complete:30496}  Objective:  LMP 08/08/2020    General:  {gen appearance:16600}   Breasts:  {desc; normal/abnormal/not indicated:14647}  Lungs: {lung exam:16931}  Heart:  {heart exam:5510}  Abdomen: {abdomen exam:16834}   Wound {Wound assessment:11097}  GU exam:  {desc; normal/abnormal/not indicated:14647}       Assessment:    There are no diagnoses linked to this encounter.  *** postpartum exam.   Plan:   Essential components of care per ACOG recommendations:  1.  Mood and well being: Patient with {gen negative/positive:315881} depression screening today. Reviewed local resources for support.  - Patient tobacco use? {tobacco use:25506}  - hx of drug use? {yes/no:25505}    2. Infant care and feeding:  -Patient currently breastmilk feeding? {yes/no:25502}  -Social determinants of health (SDOH) reviewed in EPIC. No concerns***The following needs were identified***  3. Sexuality, contraception and birth  spacing - Patient {DOES_DOES CBJ:62831} want a pregnancy in the next year.  Desired family size is {NUMBER 1-10:22536} children.  - Reviewed forms of contraception in tiered fashion. Patient desired {PLAN CONTRACEPTION:313102} today.   - Discussed birth spacing of 18 months  4. Sleep and fatigue -Encouraged family/partner/community support of 4 hrs of uninterrupted sleep to help with mood and fatigue  5. Physical Recovery  - Discussed patients delivery and complications. She describes her labor as {description:25511} - Patient had a {CHL AMB DELIVERY:[phone]}. Patient had a {laceration:25518} laceration. Perineal healing reviewed. Patient expressed understanding - Patient has urinary incontinence? {yes/no:25515} - Patient {ACTION; IS/IS DVV:[id number]} safe to resume physical and sexual activity  6.  Health Maintenance - HM due items addressed {Yes or If no, why not?:20788} - Last pap smear  Diagnosis  Date Value Ref Range Status  10/19/2020   Final   - Negative for intraepithelial lesion or malignancy (NILM)   Pap smear {done:10129} at today's visit.  -Breast Cancer screening indicated? {indicated:25516}  7. Chronic Disease/Pregnancy Condition follow up: {Follow up:25499}  - PCP follow up  Georgia Lopes, [place] [facility] [place]

## 2021-03-14 ENCOUNTER — Ambulatory Visit: Payer: Medicaid Other | Admitting: Family Medicine

## 2022-04-05 ENCOUNTER — Ambulatory Visit
Admission: EM | Admit: 2022-04-05 | Discharge: 2022-04-05 | Disposition: A | Discharge status: Home / Self Care | Payer: Self-pay | Attending: Internal Medicine | Admitting: Internal Medicine

## 2022-04-05 ENCOUNTER — Ambulatory Visit (INDEPENDENT_AMBULATORY_CARE_PROVIDER_SITE_OTHER): Payer: Self-pay

## 2022-04-05 DIAGNOSIS — M79671 Pain in right foot: Secondary | ICD-10-CM

## 2022-04-05 NOTE — Discharge Instructions (Signed)
Boot has been applied.  Follow-up with podiatry at provided contact information.

## 2022-04-05 NOTE — ED Provider Notes (Signed)
[facility]    CSN: [id number] Arrival date & time: 04/05/22  1028      History   Chief Complaint Chief Complaint  Patient presents with   Foot Pain    HPI Virginia Fitzgerald is a 37 y.o. female.   Patient presents with right foot pain that started yesterday.  Patient denies any obvious injury to the area.  Denies history of any previous injuries or chronic foot pain.  Has not taken any medications for pain.  Takes Xarelto for history of blood clots currently.   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 ([place]) 03/28/2017   A1C 6.5    Fatty liver    GDM (gestational diabetes mellitus)    WITH 2019 PREGNANCY ONLY   GERD (gastroesophageal reflux disease)    diet controlled - no meds   Headache    last one 05/25/17 - otc med   History of blood transfusion 2012   At [facility]   History of DVT of lower extremity    History of hiatal hernia    Iron deficiency anemia [id number]   Metallic taste [id number]   MRSA infection    not in EPIC - per patient in 2011   Pulmonary embolism ([place])    S/P IVC filter    Thrombocytosis     Patient Active Problem List   Diagnosis Date Noted   IUFD at 58 weeks or more of gestation 02/02/2021   Chronic hypertension in pregnancy 01/14/2021   Insulin-requiring or dependent type II diabetes mellitus ([place]) 01/14/2021   Chronic pulmonary embolism without acute cor pulmonale ([place]) 01/14/2021   Mixed hyperlipidemia 01/14/2021   Health education/counseling 01/14/2021   IUGR (intrauterine growth restriction) affecting care of mother 12/08/2020   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   S/P IVC filter 12/05/2020   Elevated AFP 11/24/2020   Low fetal fraction on [place] at 10 wks 11/21/2020   Anticoagulation in pregnancy 10/20/2020   Anemia in pregnancy 10/20/2020   Rubella non-immune status, antepartum 10/20/2020   Pre-existing  type 2 diabetes mellitus during pregnancy, antepartum 10/19/2020   AMA (advanced maternal age) multigravida 35+, first trimester 10/19/2020   Allergies 01/14/2020   Abnormal chest x-ray 12/28/2019   History of COVID-19 11/25/2019   Type 2 diabetes mellitus without complication, without long-term current use of insulin ([place]) 11/25/2019   History of recurrent deep vein thrombosis (DVT) 11/25/2019   Diabetes type 2, 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 ([place]) 03/28/2017   Essential hypertension, benign 03/20/2017   Obesity in pregnancy 03/20/2017   Recurrent pulmonary embolism ([place]) 03/20/2017   BMI 50.0-59.9, adult ([place]) 02/28/2017    Past Surgical History:  Procedure Laterality Date   DILATION AND EVACUATION N/A 05/29/2017   Procedure: DILATATION AND EVACUATION;  Surgeon: Lavonia Drafts, MD;  Location: [facility];  Service: Gynecology;  Laterality: N/A;   DILATION AND EVACUATION N/A 02/03/2021   Procedure: DILATATION AND EVACUATION;  Surgeon: Woodroe Mode, MD;  Location: [facility];  Service: Gynecology;  Laterality: N/A;   WISDOM TOOTH EXTRACTION      OB History     Gravida  5   Para  4   Term  3   Preterm  1   AB  1   Living  3      SAB  1   IAB  0   Ectopic  0  Multiple  0   Live Births  3        Obstetric Comments  Largest prior was 7lbs 7oz. No issues with deliveries.           Home Medications    Prior to Admission medications   Medication Sig Start Date End Date Taking? Authorizing Provider  acetaminophen (TYLENOL) 325 MG tablet Take 2 tablets (650 mg total) by mouth every 4 (four) hours as needed (for pain scale < 4). 02/03/21   Radene Gunning, MD  cetirizine (ZYRTEC ALLERGY) 10 MG tablet Take 1 tablet (10 mg total) by mouth daily. Patient not taking: Reported on [id number]   Defelice, Jeanett Schlein, NP  enoxaparin (LOVENOX) 80 MG/0.8ML injection Inject 0.7 mLs (70 mg total) into the skin every 12 (twelve) hours. 01/09/21   Genia Del, MD   ferrous gluconate (FERGON) 324 MG tablet Take 1 tablet (324 mg total) by mouth every other day. 10/20/20   Aletha Halim, MD  ibuprofen (ADVIL) 600 MG tablet Take 1 tablet (600 mg total) by mouth every 6 (six) hours. 02/03/21   Radene Gunning, MD  insulin aspart (NOVOLOG FLEXPEN) 100 UNIT/ML FlexPen Inject 0-9 Units into the skin 3 (three) times daily with meals. 02/03/21   Radene Gunning, MD  insulin detemir (LEVEMIR) 100 UNIT/ML FlexPen Inject 20 Units into the skin daily. 02/03/21   Radene Gunning, MD  Insulin Pen Needle (NOVOFINE) 30G X 8 MM MISC Inject 10 each into the skin as needed. 02/03/21   Radene Gunning, MD  labetalol (NORMODYNE) 200 MG tablet Take 1 tablet (200 mg total) by mouth 2 (two) times daily. 10/19/20   Aletha Halim, MD  NIFEdipine (ADALAT CC) 30 MG 24 hr tablet Take 1 tablet (30 mg total) by mouth daily. 01/13/21   Tobb, Kardie, DO  oxyCODONE (OXY IR/ROXICODONE) 5 MG immediate release tablet Take 1 tablet (5 mg total) by mouth every 4 (four) hours as needed for severe pain or breakthrough pain. 02/03/21   Radene Gunning, MD  Blood Pressure Monitoring (BLOOD PRESSURE KIT) DEVI 1 Device by Does not apply route as needed. Patient not taking: No sig reported 10/19/20   Aletha Halim, MD    Family History Family History  Problem Relation Age of Onset   Diabetes Mother    Hypertension Mother    Hypertension Maternal Uncle    Diabetes Maternal Uncle    Hypertension Maternal Grandmother     Social History Social History   Tobacco Use   Smoking status: Never   Smokeless tobacco: Never   Tobacco comments:    never used tobacco  Vaping Use   Vaping Use: Never used  Substance Use Topics   Alcohol use: No    Alcohol/week: 0.0 standard drinks of alcohol   Drug use: No     Allergies   Patient has no known allergies.   Review of Systems Review of Systems Per HPI  Physical Exam Triage Vital Signs ED Triage Vitals  Enc Vitals Group     BP 04/05/22 1052 133/81      Pulse Rate 04/05/22 1052 94     Resp 04/05/22 1052 18     Temp 04/05/22 1052 97.8 F (36.6 C)     Temp Source 04/05/22 1052 Oral     SpO2 04/05/22 1052 100 %     Weight --      Height --      Head Circumference --      Peak Flow --  Pain Score 04/05/22 1050 7     Pain Loc --      Pain Edu? --      Excl. in [place]? --    No data found.  Updated Vital Signs BP 133/81 (BP Location: Left Arm)   Pulse 94   Temp 97.8 F (36.6 C) (Oral)   Resp 18   LMP 03/18/2022   SpO2 100%   Visual Acuity Right Eye Distance:   Left Eye Distance:   Bilateral Distance:    Right Eye Near:   Left Eye Near:    Bilateral Near:     Physical Exam Constitutional:      General: She is not in acute distress.    Appearance: Normal appearance. She is not toxic-appearing or diaphoretic.  HENT:     Head: Normocephalic and atraumatic.  Eyes:     Extraocular Movements: Extraocular movements intact.     Conjunctiva/sclera: Conjunctivae normal.  Pulmonary:     Effort: Pulmonary effort is normal.  Musculoskeletal:       Feet:  Feet:     Comments: Patient has tenderness to palpation to mid proximal dorsal surface of foot.  There is no obvious discoloration or swelling.  Patient can wiggle toes.  Capillary refill and pulses intact.  No tenderness to ankle. Neurological:     General: No focal deficit present.     Mental Status: She is alert and oriented to person, place, and time. Mental status is at baseline.  Psychiatric:        Mood and Affect: Mood normal.        Behavior: Behavior normal.        Thought Content: Thought content normal.        Judgment: Judgment normal.      UC Treatments / Results  Labs (all labs ordered are listed, but only abnormal results are displayed) Labs Reviewed - No data to display  EKG   Radiology DG Foot Complete Right  Result Date: 04/05/2022 CLINICAL DATA:  Right foot pain EXAM: RIGHT FOOT COMPLETE - 3+ VIEW COMPARISON:  None FINDINGS: Small plantar and  Achilles calcaneal spurs. Midfoot degenerative spurring particularly dorsally in the navicular. Mildly flattened longitudinal arch of the foot. Equivocal periostitis in the midshaft of the proximal phalanx fourth toe. No well-defined fracture. No malalignment. IMPRESSION: 1. Equivocal periostitis in the midshaft of the proximal phalanx of the fourth toe. 2. Midfoot degenerative spurring. 3. Mildly flattened longitudinal arch of the foot. 4. Small plantar and Achilles calcaneal spurs. Electronically Signed   By: Van Clines M.D.   On: 04/05/2022 11:20    Procedures Procedures (including critical care time)  Medications Ordered in UC Medications - No data to display  Initial Impression / Assessment and Plan / UC Course  I have reviewed the triage vital signs and the nursing notes.  Pertinent labs & imaging results that were available during my care of the patient were reviewed by me and considered in my medical decision making (see chart for details).     There is no concern for DVT on physical exam especially given the patient takes Xarelto routinely.  Foot is neurovascularly intact as well which is reassuring.  Foot x-ray completed that was negative for any acute bony abnormality.  It did show multiple different types of inflammation/degenerative changes which could be contributing to patient's pain but unsure exact etiology of patient's discomfort.  Will apply a cam boot and advised patient of supportive care.  Advised patient to avoid  NSAIDs given that she takes Xarelto. Patient did have elevated alkaline phos a few years prior but other liver enzymes were normal and there was no sign of acute liver abnormality so tylenol should be safe as needed for pain relief.  Also think patient needs to follow-up with podiatry at provided contact information for further evaluation and management.  Discussed return precautions.  Patient verbalized understanding and was agreeable with plan. Final Clinical  Impressions(s) / UC Diagnoses   Final diagnoses:  Right foot pain     Discharge Instructions      Boot has been applied.  Follow-up with podiatry at provided contact information.     ED Prescriptions   None    PDMP not reviewed this encounter.   Teodora Medici, [place] 04/05/22 1151

## 2022-04-05 NOTE — ED Triage Notes (Signed)
Pt presents with right foot pain X 2 days with no known injury.

## 2022-04-10 ENCOUNTER — Ambulatory Visit: Payer: BC Managed Care – PPO | Admitting: Podiatry

## 2022-04-10 ENCOUNTER — Encounter: Payer: Self-pay | Admitting: Podiatry

## 2022-04-10 ENCOUNTER — Ambulatory Visit (INDEPENDENT_AMBULATORY_CARE_PROVIDER_SITE_OTHER): Payer: BC Managed Care – PPO

## 2022-04-10 ENCOUNTER — Ambulatory Visit (INDEPENDENT_AMBULATORY_CARE_PROVIDER_SITE_OTHER): Payer: BC Managed Care – PPO | Admitting: Podiatry

## 2022-04-10 DIAGNOSIS — M19071 Primary osteoarthritis, right ankle and foot: Secondary | ICD-10-CM

## 2022-04-10 DIAGNOSIS — M19079 Primary osteoarthritis, unspecified ankle and foot: Secondary | ICD-10-CM | POA: Diagnosis not present

## 2022-04-10 MED ORDER — METHYLPREDNISOLONE 4 MG PO TBPK: ORAL_TABLET | ORAL | 0 refills | Status: DC

## 2022-04-10 NOTE — Progress Notes (Signed)
  Subjective:  Patient ID: Virginia Fitzgerald, female    DOB: 09-Apr-1985,   MRN: [id number]  Chief Complaint  Patient presents with   Foot Pain    Right foot pain , on going for 2 weeks patient states it started after she started working 12 hour shift for 6 days. Foot is only painful when pressure is applied. Patient states Urgent care told her she has a bone spur and arthritis.     37 y.o. female presents for concern of right foot pain that has been going on for a few weeks. Relates as above. States she was given a boot at urgent care but has not worn it.   . Denies any other pedal complaints. Denies n/v/f/c.   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 ([place]) 03/28/2017   A1C 6.5    Fatty liver    GDM (gestational diabetes mellitus)    WITH 2019 PREGNANCY ONLY   GERD (gastroesophageal reflux disease)    diet controlled - no meds   Headache    last one 05/25/17 - otc med   History of blood transfusion 2012   At [facility]   History of DVT of lower extremity    History of hiatal hernia    Iron deficiency anemia 76/28/3151   Metallic taste 76/16/0737   MRSA infection    not in EPIC - per patient in 2011   Pulmonary embolism ([place])    S/P IVC filter    Thrombocytosis     Objective:  Physical Exam: Vascular: DP/PT pulses 2/4 bilateral. CFT <3 seconds. Normal hair growth on digits. No edema.  Skin. No lacerations or abrasions bilateral feet.  Musculoskeletal: MMT 5/5 bilateral lower extremities in DF, PF, Inversion and Eversion. Deceased ROM in DF of ankle joint.  Tender over the midfoot area upon palpation. No pain to plantar calcaneal tubercle. No pain along achilles. No pain with ROM of ankle and StJ. Full ROM of the subtalar joint.  Neurological: Sensation intact to light touch.   Assessment:   1. Arthritis of midfoot      Plan:  Patient was evaluated and treated and all questions  answered. X-rays reviewed and discussed with patient. No acute fractures or dislocations noted.  Midfoot degenerative changes noted with pes planus and collapse of the medial arch with navicular head un coverage. Mild talar beaking with possible c sign.  Reviewed noteds from urgent care. Reviewed previous X-rays.  Discussed midfoot arthritis with patient and treatment options.  Discussed NSAIDS, topicals, and possible injections.  Prescription for medrol dose pack to see if this calms down. Deferred injection. . Discussed stiff soled shoes and CMO. Patient will look into this with insurance. Discussed OTC options if not covered.  Discussed if pain does not improve. .  Patient to follow-up as needed.    Lorenda Peck, DPM

## 2022-04-20 ENCOUNTER — Inpatient Hospital Stay (HOSPITAL_COMMUNITY)
Admission: AD | Admit: 2022-04-20 | Discharge: 2022-04-20 | Disposition: A | Discharge status: Home / Self Care | Payer: BC Managed Care – PPO | Attending: Obstetrics & Gynecology | Admitting: Obstetrics & Gynecology

## 2022-04-20 ENCOUNTER — Encounter (HOSPITAL_COMMUNITY): Payer: Self-pay | Admitting: Obstetrics & Gynecology

## 2022-04-20 ENCOUNTER — Ambulatory Visit
Admission: EM | Admit: 2022-04-20 | Discharge: 2022-04-20 | Disposition: A | Discharge status: Other Acute Inpatient Hospital | Payer: BC Managed Care – PPO | Attending: Internal Medicine | Admitting: Internal Medicine

## 2022-04-20 DIAGNOSIS — O161 Unspecified maternal hypertension, first trimester: Secondary | ICD-10-CM | POA: Insufficient documentation

## 2022-04-20 DIAGNOSIS — D649 Anemia, unspecified: Secondary | ICD-10-CM | POA: Diagnosis not present

## 2022-04-20 DIAGNOSIS — Z79899 Other long term (current) drug therapy: Secondary | ICD-10-CM | POA: Insufficient documentation

## 2022-04-20 DIAGNOSIS — O0990 Supervision of high risk pregnancy, unspecified, unspecified trimester: Secondary | ICD-10-CM

## 2022-04-20 DIAGNOSIS — R030 Elevated blood-pressure reading, without diagnosis of hypertension: Secondary | ICD-10-CM | POA: Diagnosis not present

## 2022-04-20 DIAGNOSIS — O99281 Endocrine, nutritional and metabolic diseases complicating pregnancy, first trimester: Secondary | ICD-10-CM | POA: Insufficient documentation

## 2022-04-20 DIAGNOSIS — E785 Hyperlipidemia, unspecified: Secondary | ICD-10-CM | POA: Diagnosis not present

## 2022-04-20 DIAGNOSIS — Z794 Long term (current) use of insulin: Secondary | ICD-10-CM | POA: Diagnosis not present

## 2022-04-20 DIAGNOSIS — O99011 Anemia complicating pregnancy, first trimester: Secondary | ICD-10-CM | POA: Diagnosis present

## 2022-04-20 DIAGNOSIS — O24111 Pre-existing diabetes mellitus, type 2, in pregnancy, first trimester: Secondary | ICD-10-CM | POA: Diagnosis not present

## 2022-04-20 DIAGNOSIS — L02211 Cutaneous abscess of abdominal wall: Secondary | ICD-10-CM | POA: Insufficient documentation

## 2022-04-20 DIAGNOSIS — O09521 Supervision of elderly multigravida, first trimester: Secondary | ICD-10-CM | POA: Diagnosis not present

## 2022-04-20 DIAGNOSIS — R42 Dizziness and giddiness: Secondary | ICD-10-CM | POA: Diagnosis not present

## 2022-04-20 DIAGNOSIS — Z3201 Encounter for pregnancy test, result positive: Secondary | ICD-10-CM

## 2022-04-20 DIAGNOSIS — Z3A01 Less than 8 weeks gestation of pregnancy: Secondary | ICD-10-CM | POA: Diagnosis not present

## 2022-04-20 LAB — URINALYSIS, ROUTINE W REFLEX MICROSCOPIC
Bilirubin Urine: NEGATIVE
Glucose, UA: NEGATIVE mg/dL
Hgb urine dipstick: NEGATIVE
Ketones, ur: NEGATIVE mg/dL
Nitrite: NEGATIVE
Protein, ur: NEGATIVE mg/dL
Specific Gravity, Urine: 1.011 (ref 1.005–1.030)
pH: 6 (ref 5.0–8.0)

## 2022-04-20 LAB — POCT URINE PREGNANCY: Preg Test, Ur: POSITIVE — AB

## 2022-04-20 LAB — CBC
HCT: 28.6 % — ABNORMAL LOW (ref 36.0–46.0)
Hemoglobin: 7.9 g/dL — ABNORMAL LOW (ref 12.0–15.0)
MCH: 16.1 pg — ABNORMAL LOW (ref 26.0–34.0)
MCHC: 27.6 g/dL — ABNORMAL LOW (ref 30.0–36.0)
MCV: 58.4 fL — ABNORMAL LOW (ref 80.0–100.0)
Platelets: 485 10*3/uL — ABNORMAL HIGH (ref 150–400)
RBC: 4.9 MIL/uL (ref 3.87–5.11)
RDW: 22.5 % — ABNORMAL HIGH (ref 11.5–15.5)
WBC: 14 10*3/uL — ABNORMAL HIGH (ref 4.0–10.5)
nRBC: 0 % (ref 0.0–0.2)

## 2022-04-20 MED ORDER — CEPHALEXIN 500 MG PO CAPS
500.0000 mg | ORAL_CAPSULE | Freq: Four times a day (QID) | ORAL | Status: DC
  Administered 2022-04-20: 500 mg via ORAL
  Filled 2022-04-20 (×3): qty 1

## 2022-04-20 MED ORDER — CEPHALEXIN 500 MG PO CAPS: 500.0000 mg | ORAL_CAPSULE | Freq: Four times a day (QID) | ORAL | 0 refills | Status: DC

## 2022-04-20 MED ORDER — FERROUS GLUCONATE 324 (38 FE) MG PO TABS: 324.0000 mg | ORAL_TABLET | ORAL | 1 refills | Status: DC

## 2022-04-20 NOTE — ED Triage Notes (Addendum)
Pt presents to uc with co of dizziness and light headed for 3 days, with positive pregnancy test at home. Pt reports nausea this morning as well. LMP feb 18th.   Pt reports abscess on abdomin since 3 days ago.

## 2022-04-20 NOTE — ED Notes (Signed)
Patient is being discharged from the Urgent Care and sent to the Emergency Department via pov . Per [place] np, patient is in need of higher level of care due to dizziness htn and pregnancy. Patient is aware and verbalizes understanding of plan of care.  Vitals:   04/20/22 1705  BP: (!) 163/93  Pulse: (!) 101  Resp: 19  Temp: 97.9 F (36.6 C)  SpO2: 98%

## 2022-04-20 NOTE — Discharge Instructions (Signed)
Please go to the maternity assessment unit at  Hospital for further evaluation and management. 

## 2022-04-20 NOTE — ED Provider Notes (Signed)
[facility]    CSN: [id number] Arrival date & time: 04/20/22  1635      History   Chief Complaint Chief Complaint  Patient presents with   Dizziness   Abscess    HPI Virginia Fitzgerald is a 37 y.o. female.   Patient presents with 2 different chief complaints today.  She reports that she had a positive pregnancy test at home that she took today.  Last menstrual cycle was 03/18/2022.  She denies abdominal pain, abnormal vaginal bleeding, dysuria, vaginal discharge, back pain, fever.  She reports this is an unplanned pregnancy.  She has had 5 previous pregnancies with 3 living children.  She has had a stillborn child and a previous miscarriage.  Reports that she has been having dizziness for about a week that is intermittent.  Denies headache but does report some minimal blurry vision at times with associated dizziness.  Reports nausea without vomiting that started today.  Patient also reporting an abscess to her abdominal wall that has been present for 3 days.  Reports that she does have abscesses in her armpits but has never had one on her abdomen.  Denies purulent drainage from the area.  Denies any associated fever.   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 ([place]) 03/28/2017   A1C 6.5    Fatty liver    GDM (gestational diabetes mellitus)    WITH 2019 PREGNANCY ONLY   GERD (gastroesophageal reflux disease)    diet controlled - no meds   Headache    last one 05/25/17 - otc med   History of blood transfusion 2012   At [facility]   History of DVT of lower extremity    History of hiatal hernia    Iron deficiency anemia 0000000   Metallic taste 123456   MRSA infection    not in EPIC - per patient in 2011   Pulmonary embolism ([place])    S/P IVC filter    Thrombocytosis     Patient Active Problem List   Diagnosis Date Noted   IUFD at 64 weeks or  more of gestation 02/02/2021   Chronic hypertension in pregnancy 01/14/2021   Insulin-requiring or dependent type II diabetes mellitus ([place]) 01/14/2021   Chronic pulmonary embolism without acute cor pulmonale ([place]) 01/14/2021   Mixed hyperlipidemia 01/14/2021   Health education/counseling 01/14/2021   IUGR (intrauterine growth restriction) affecting care of mother 12/08/2020   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   S/P IVC filter 12/05/2020   Elevated AFP 11/24/2020   Low fetal fraction on [place] at 10 wks 11/21/2020   Anticoagulation in pregnancy 10/20/2020   Anemia in pregnancy 10/20/2020   Rubella non-immune status, antepartum 10/20/2020   Pre-existing type 2 diabetes mellitus during pregnancy, antepartum 10/19/2020   AMA (advanced maternal age) multigravida 35+, first trimester 10/19/2020   Allergies 01/14/2020   Abnormal chest x-ray 12/28/2019   History of COVID-19 11/25/2019   Type 2 diabetes mellitus without complication, without long-term current use of insulin ([place]) 11/25/2019   History of recurrent deep vein thrombosis (DVT) 11/25/2019   Diabetes type 2, 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 ([place]) 03/28/2017   Essential hypertension, benign 03/20/2017   Obesity in pregnancy 03/20/2017   Recurrent pulmonary embolism ([place]) 03/20/2017   BMI 50.0-59.9, adult ([place]) 02/28/2017    Past Surgical History:  Procedure Laterality Date   DILATION AND EVACUATION N/A  05/29/2017   Procedure: DILATATION AND EVACUATION;  Surgeon: Lavonia Drafts, MD;  Location: [facility];  Service: Gynecology;  Laterality: N/A;   DILATION AND EVACUATION N/A 02/03/2021   Procedure: DILATATION AND EVACUATION;  Surgeon: Woodroe Mode, MD;  Location: [facility];  Service: Gynecology;  Laterality: N/A;   WISDOM TOOTH EXTRACTION      OB History     Gravida  5   Para  4   Term  3   Preterm  1   AB  1   Living  3      SAB  1   IAB  0   Ectopic  0   Multiple  0   Live Births  3        Obstetric  Comments  Largest prior was 7lbs 7oz. No issues with deliveries.           Home Medications    Prior to Admission medications   Medication Sig Start Date End Date Taking? Authorizing Provider  acetaminophen (TYLENOL) 325 MG tablet Take 2 tablets (650 mg total) by mouth every 4 (four) hours as needed (for pain scale < 4). 02/03/21   Radene Gunning, MD  cetirizine (ZYRTEC ALLERGY) 10 MG tablet Take 1 tablet (10 mg total) by mouth daily. Patient not taking: Reported on [id number]   Defelice, Jeanett Schlein, NP  enoxaparin (LOVENOX) 80 MG/0.8ML injection Inject 0.7 mLs (70 mg total) into the skin every 12 (twelve) hours. 01/09/21   Genia Del, MD  ferrous gluconate (FERGON) 324 MG tablet Take 1 tablet (324 mg total) by mouth every other day. 10/20/20   Aletha Halim, MD  ibuprofen (ADVIL) 600 MG tablet Take 1 tablet (600 mg total) by mouth every 6 (six) hours. 02/03/21   Radene Gunning, MD  insulin aspart (NOVOLOG FLEXPEN) 100 UNIT/ML FlexPen Inject 0-9 Units into the skin 3 (three) times daily with meals. 02/03/21   Radene Gunning, MD  insulin detemir (LEVEMIR) 100 UNIT/ML FlexPen Inject 20 Units into the skin daily. 02/03/21   Radene Gunning, MD  Insulin Pen Needle (NOVOFINE) 30G X 8 MM MISC Inject 10 each into the skin as needed. 02/03/21   Radene Gunning, MD  labetalol (NORMODYNE) 200 MG tablet Take 1 tablet (200 mg total) by mouth 2 (two) times daily. 10/19/20   Aletha Halim, MD  methylPREDNISolone (MEDROL DOSEPAK) 4 MG TBPK tablet Take as directed 04/10/22   Lorenda Peck, DPM  NIFEdipine (ADALAT CC) 30 MG 24 hr tablet Take 1 tablet (30 mg total) by mouth daily. 01/13/21   Tobb, Kardie, DO  oxyCODONE (OXY IR/ROXICODONE) 5 MG immediate release tablet Take 1 tablet (5 mg total) by mouth every 4 (four) hours as needed for severe pain or breakthrough pain. 02/03/21   Radene Gunning, MD  Blood Pressure Monitoring (BLOOD PRESSURE KIT) DEVI 1 Device by Does not apply route as needed. Patient not  taking: No sig reported 10/19/20   Aletha Halim, MD    Family History Family History  Problem Relation Age of Onset   Diabetes Mother    Hypertension Mother    Hypertension Maternal Uncle    Diabetes Maternal Uncle    Hypertension Maternal Grandmother     Social History Social History   Tobacco Use   Smoking status: Never   Smokeless tobacco: Never   Tobacco comments:    never used tobacco  Vaping Use   Vaping Use: Never used  Substance Use Topics   Alcohol use: No  Alcohol/week: 0.0 standard drinks of alcohol   Drug use: No     Allergies   Patient has no known allergies.   Review of Systems Review of Systems Per HPI  Physical Exam Triage Vital Signs ED Triage Vitals  Enc Vitals Group     BP 04/20/22 1705 (!) 163/93     Pulse Rate 04/20/22 1705 (!) 101     Resp 04/20/22 1705 19     Temp 04/20/22 1705 97.9 F (36.6 C)     Temp src --      SpO2 04/20/22 1705 98 %     Weight --      Height --      Head Circumference --      Peak Flow --      Pain Score 04/20/22 1702 0     Pain Loc --      Pain Edu? --      Excl. in [place]? --    No data found.  Updated Vital Signs BP (!) 163/93   Pulse (!) 101   Temp 97.9 F (36.6 C)   Resp 19   LMP 03/18/2022 (Exact Date)   SpO2 98%   Visual Acuity Right Eye Distance:   Left Eye Distance:   Bilateral Distance:    Right Eye Near:   Left Eye Near:    Bilateral Near:     Physical Exam Constitutional:      General: She is not in acute distress.    Appearance: Normal appearance. She is not toxic-appearing or diaphoretic.  HENT:     Head: Normocephalic and atraumatic.  Eyes:     Extraocular Movements: Extraocular movements intact.     Conjunctiva/sclera: Conjunctivae normal.     Pupils: Pupils are equal, round, and reactive to light.  Cardiovascular:     Rate and Rhythm: Regular rhythm. Tachycardia present.     Pulses: Normal pulses.     Heart sounds: Normal heart sounds.  Pulmonary:     Effort:  Pulmonary effort is normal. No respiratory distress.     Breath sounds: Normal breath sounds.  Abdominal:     General: Bowel sounds are normal. There is no distension.     Palpations: Abdomen is soft.     Tenderness: There is no abdominal tenderness.  Skin:    Comments: Approximately 2.5 cm indurated abscess with surrounding erythema and induration present to left lower abdomen.  No purulent drainage noted.  Neurological:     General: No focal deficit present.     Mental Status: She is alert and oriented to person, place, and time. Mental status is at baseline.     Cranial Nerves: Cranial nerves 2-12 are intact.     Sensory: Sensation is intact.     Motor: Motor function is intact.     Coordination: Coordination is intact.     Gait: Gait is intact.  Psychiatric:        Mood and Affect: Mood normal.        Behavior: Behavior normal.        Thought Content: Thought content normal.        Judgment: Judgment normal.      UC Treatments / Results  Labs (all labs ordered are listed, but only abnormal results are displayed) Labs Reviewed  POCT URINE PREGNANCY - Abnormal; Notable for the following components:      Result Value   Preg Test, Ur Positive (*)    All other components within normal limits  EKG   Radiology No results found.  Procedures Procedures (including critical care time)  Medications Ordered in UC Medications - No data to display  Initial Impression / Assessment and Plan / UC Course  I have reviewed the triage vital signs and the nursing notes.  Pertinent labs & imaging results that were available during my care of the patient were reviewed by me and considered in my medical decision making (see chart for details).     Patient's pregnancy test was positive today.  I am very concerned given patient is having dizziness, tachycardia, elevated blood pressure reading.  Dizziness, nausea, vomiting could be early pregnancy symptoms but with associated abnormal  vital signs I do think that more extensive evaluation today by maternity assessment unit at [facility] emergency department is necessary.  She also has an abscess to left lower abdomen and symptoms could be related to complications related to this.  Therefore, patient was advised to go to the maternity assessment unit for further evaluation and management of pregnancy related symptoms and abdominal abscess to ensure no complications are present.  Patient verbalized understanding and was agreeable with plan.  Neuroexam and vital signs stable. patient left via self transport. Final Clinical Impressions(s) / UC Diagnoses   Final diagnoses:  Positive urine pregnancy test  Dizziness and giddiness  Elevated blood pressure reading  Abscess of abdominal wall     Discharge Instructions      Please go to the maternity assessment unit at [facility] for further evaluation and management.    ED Prescriptions   None    PDMP not reviewed this encounter.   Teodora Medici, [place] 04/20/22 [id number]

## 2022-04-20 NOTE — MAU Provider Note (Cosign Needed Addendum)
History     CSN: [id number]  Arrival date and time: 04/20/22 1813   None     Chief Complaint  Patient presents with   Hypertension   Dizziness   HPI  Virginia Fitzgerald is a 37yo F presenting w/ dizziness and elevated BP reading at UC. She has had dizziness for the past 4 days. Had some N/V, but still tolerating PO. Denies HA, vision changes, abm pain, chest pain, SOB, congestion. Went to UC, noted to have elevated BP and positive pregnancy test, and was directed to MAU.   Also had 4 day hx of skin abscess on L abm wall. Has hx of similar lesions in armpits. Denies fever. Minimally tender.  During your last pregnancy, had high blood pressure and was taking 2 medications (one started with an "L"). Also took low dose ASA.   OB History     Gravida  6   Para  4   Term  3   Preterm  1   AB  1   Living  3      SAB  1   IAB  0   Ectopic  0   Multiple  0   Live Births  3        Obstetric Comments  Largest prior was 7lbs 7oz. No issues with deliveries.          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 ([place]) 03/28/2017   A1C 6.5    Fatty liver    GDM (gestational diabetes mellitus)    WITH 2019 PREGNANCY ONLY   GERD (gastroesophageal reflux disease)    diet controlled - no meds   Headache    last one 05/25/17 - otc med   History of blood transfusion 2012   At [facility]   History of DVT of lower extremity    History of hiatal hernia    Iron deficiency anemia 0000000   Metallic taste 123456   MRSA infection    not in EPIC - per patient in 2011   Pulmonary embolism ([place])    S/P IVC filter    Thrombocytosis     Past Surgical History:  Procedure Laterality Date   DILATION AND EVACUATION N/A 05/29/2017   Procedure: DILATATION AND EVACUATION;  Surgeon: Lavonia Drafts, MD;  Location: [facility];  Service: Gynecology;  Laterality: N/A;   DILATION AND EVACUATION N/A  02/03/2021   Procedure: DILATATION AND EVACUATION;  Surgeon: Woodroe Mode, MD;  Location: [facility];  Service: Gynecology;  Laterality: N/A;   WISDOM TOOTH EXTRACTION      Family History  Problem Relation Age of Onset   Diabetes Mother    Hypertension Mother    Hypertension Maternal Uncle    Diabetes Maternal Uncle    Hypertension Maternal Grandmother     Social History   Tobacco Use   Smoking status: Never   Smokeless tobacco: Never   Tobacco comments:    never used tobacco  Vaping Use   Vaping Use: Never used  Substance Use Topics   Alcohol use: No    Alcohol/week: 0.0 standard drinks of alcohol   Drug use: No    Allergies: No Known Allergies  Medications Prior to Admission  Medication Sig Dispense Refill Last Dose   acetaminophen (TYLENOL) 325 MG tablet Take 2 tablets (650 mg total) by mouth every 4 (four) hours as needed (for  pain scale < 4). 60 tablet 1 Past Month   cetirizine (ZYRTEC ALLERGY) 10 MG tablet Take 1 tablet (10 mg total) by mouth daily. (Patient not taking: Reported on 11/15/2020) 14 tablet 0    enoxaparin (LOVENOX) 80 MG/0.8ML injection Inject 0.7 mLs (70 mg total) into the skin every 12 (twelve) hours. (Patient not taking: Reported on 04/20/2022) 90 mL 1 Not Taking   ferrous gluconate (FERGON) 324 MG tablet Take 1 tablet (324 mg total) by mouth every other day. (Patient not taking: Reported on 04/20/2022) 60 tablet 1 Not Taking   ibuprofen (ADVIL) 600 MG tablet Take 1 tablet (600 mg total) by mouth every 6 (six) hours. (Patient not taking: Reported on 04/20/2022) 60 tablet 1 Not Taking   insulin aspart (NOVOLOG FLEXPEN) 100 UNIT/ML FlexPen Inject 0-9 Units into the skin 3 (three) times daily with meals. (Patient not taking: Reported on 04/20/2022) 15 mL 1 Not Taking   insulin detemir (LEVEMIR) 100 UNIT/ML FlexPen Inject 20 Units into the skin daily. (Patient not taking: Reported on 04/20/2022) 15 mL 1 Not Taking   Insulin Pen Needle (NOVOFINE) 30G X 8 MM MISC  Inject 10 each into the skin as needed. (Patient not taking: Reported on 04/20/2022) 3 packet 2 Not Taking   labetalol (NORMODYNE) 200 MG tablet Take 1 tablet (200 mg total) by mouth 2 (two) times daily. (Patient not taking: Reported on 04/20/2022) 60 tablet 3 Not Taking   methylPREDNISolone (MEDROL DOSEPAK) 4 MG TBPK tablet Take as directed (Patient not taking: Reported on 04/20/2022) 21 tablet 0 Not Taking   NIFEdipine (ADALAT CC) 30 MG 24 hr tablet Take 1 tablet (30 mg total) by mouth daily. (Patient not taking: Reported on 04/20/2022) 90 tablet 3 Not Taking   oxyCODONE (OXY IR/ROXICODONE) 5 MG immediate release tablet Take 1 tablet (5 mg total) by mouth every 4 (four) hours as needed for severe pain or breakthrough pain. (Patient not taking: Reported on 04/20/2022) 20 tablet 0 Not Taking    Review of Systems Physical Exam   Blood pressure 131/82, pulse (!) 101, temperature 98.3 F (36.8 C), temperature source Oral, resp. rate 18, height 5\' 2"  (1.575 m), weight 132.3 kg, last menstrual period 03/18/2022, SpO2 100 %.  Physical Exam Gen: Alert, sitting up comfortably in bed. NAD. CV: RRR Resp: Normal WOB on RA. Abm: Soft, nontender, nondistended. Skin: Indurated, tender, erythematous raised skin lesion on L lower abdominal wall.  MAU Course  Procedures Results for orders placed or performed during the hospital encounter of 04/20/22 (from the past 24 hour(s))  Urinalysis, Routine w reflex microscopic -Urine, Clean Catch     Status: Abnormal   Collection Time: 04/20/22  7:43 PM  Result Value Ref Range   Color, Urine YELLOW YELLOW   APPearance HAZY (A) CLEAR   Specific Gravity, Urine 1.011 1.005 - 1.030   pH 6.0 5.0 - 8.0   Glucose, UA NEGATIVE NEGATIVE mg/dL   Hgb urine dipstick NEGATIVE NEGATIVE   Bilirubin Urine NEGATIVE NEGATIVE   Ketones, ur NEGATIVE NEGATIVE mg/dL   Protein, ur NEGATIVE NEGATIVE mg/dL   Nitrite NEGATIVE NEGATIVE   Leukocytes,Ua SMALL (A) NEGATIVE   RBC / HPF 0-5 0  - 5 RBC/hpf   WBC, UA 6-10 0 - 5 WBC/hpf   Bacteria, UA RARE (A) NONE SEEN   Squamous Epithelial / HPF 6-10 0 - 5 /HPF   Mucus PRESENT   CBC     Status: Abnormal   Collection Time: 04/20/22  7:58 PM  Result Value Ref Range   WBC 14.0 (H) 4.0 - 10.5 K/uL   RBC 4.90 3.87 - 5.11 MIL/uL   Hemoglobin 7.9 (L) 12.0 - 15.0 g/dL   HCT 28.6 (L) 36.0 - 46.0 %   MCV 58.4 (L) 80.0 - 100.0 fL   MCH 16.1 (L) 26.0 - 34.0 pg   MCHC 27.6 (L) 30.0 - 36.0 g/dL   RDW 22.5 (H) 11.5 - 15.5 %   Platelets 485 (H) 150 - 400 K/uL   nRBC 0.0 0.0 - 0.2 %    MDM  1701 BP 141/80  1940 - BP downtrended to 136/74 without intervention - CBC - Started keflex  Assessment and Plan   Virginia Fitzgerald is a 37yo F [id number] @[redacted]w[redacted]d  w/ hx of cHTN, T2DM, HLD, recurrent PE that presents w/ elevated BP, dizziness, and abm wall abscess.  Dizziness  cHTN Has 4 day hx of dizziness and found to have elevated BP. Has some N/V, but still tolerating PO. Denies HA, vision changes. Has hx of cHTN and took labetalol and procardia and ASA in prior pregnancy. Not currently taking any antihypertensive. Differential includes uncontrolled HTN, anemia, and PE. HTN is most likely given hx of cHTN needing medications during prior pregnancy. Anemia is supported by hx of anemia. PE is considered given hx of recurrent PE, but pt respiratory status is normal. - f/u CBC - Discussed getting connected with prenatal care. Will likely need to start antihypertensives.  - Since she is also having some normotensive readings w/o medications, will defer starting meds today. - Consider starting ASA outpt  Abm Wall Abscess 4 day hx of L abm wall skin abscess. No systemic symptoms (fever). Has hx of similar lesions in BL armpits. Presentation is c/f HS.  - Keflex 500mg  QID x5day for skin abscess - Can follow-up w/ PCP for workup and long term management of potential HS  Virginia Fitzgerald 04/20/2022, 7:08 PM   Reassessment (9:30 PM) Assessment and student supervision  completed by Robyne Askew, NP Results as above.  Informed of anemia and need to take iron supplement. Rx refilled. New prescription for keflex for skin abscess.  Patient questions blood pressure and informed bp at this facility normal. But should monitor. Discharge to home in stable condition.   Maryann Conners MSN, CNM Advanced Practice Provider, [facility]

## 2022-04-20 NOTE — MAU Note (Addendum)
...  Virginia Fitzgerald is a 37 y.o. at [redacted]w[redacted]d here in MAU reporting: Told to come here per Urgent Care for her elevated pressures. She reports she went to Urgent Care for dizziness that has been intermittent for one week as well as nausea. She is also concerned about a "sore" on her left lower abdomen. She reports it appeared four days ago. She reports she usually gets these sores in her arm pits and in her groin area. Has not been diagnosed with Hidradenitis Suppurativa. She reports she is currently experiencing dizziness and "pressure" in her nose that radiates to her forehead. Denies VB.   LMP: 03/18/2022 - spotting - was not like a normal period. Last full period in January. Unsure of when.  Pain score: Denies pain.  Vitals:   04/20/22 1833  BP: 138/82  Pulse: (!) 106  Resp: 18  Temp: 98.3 F (36.8 C)  SpO2: 100%   Lab orders placed from triage:  UA

## 2022-05-02 ENCOUNTER — Inpatient Hospital Stay (HOSPITAL_COMMUNITY): Payer: BC Managed Care – PPO

## 2022-05-02 ENCOUNTER — Inpatient Hospital Stay (HOSPITAL_COMMUNITY)
Admission: AD | Admit: 2022-05-02 | Discharge: 2022-05-02 | Disposition: A | Discharge status: Home / Self Care | Payer: BC Managed Care – PPO | Attending: Obstetrics and Gynecology | Admitting: Obstetrics and Gynecology

## 2022-05-02 ENCOUNTER — Encounter (HOSPITAL_COMMUNITY): Payer: Self-pay | Admitting: Obstetrics and Gynecology

## 2022-05-02 DIAGNOSIS — R109 Unspecified abdominal pain: Secondary | ICD-10-CM | POA: Diagnosis not present

## 2022-05-02 DIAGNOSIS — O99211 Obesity complicating pregnancy, first trimester: Secondary | ICD-10-CM | POA: Insufficient documentation

## 2022-05-02 DIAGNOSIS — Z8249 Family history of ischemic heart disease and other diseases of the circulatory system: Secondary | ICD-10-CM | POA: Diagnosis not present

## 2022-05-02 DIAGNOSIS — O10912 Unspecified pre-existing hypertension complicating pregnancy, second trimester: Secondary | ICD-10-CM | POA: Insufficient documentation

## 2022-05-02 DIAGNOSIS — M545 Low back pain, unspecified: Secondary | ICD-10-CM | POA: Diagnosis not present

## 2022-05-02 DIAGNOSIS — O10919 Unspecified pre-existing hypertension complicating pregnancy, unspecified trimester: Secondary | ICD-10-CM

## 2022-05-02 DIAGNOSIS — O26899 Other specified pregnancy related conditions, unspecified trimester: Secondary | ICD-10-CM

## 2022-05-02 DIAGNOSIS — O26891 Other specified pregnancy related conditions, first trimester: Secondary | ICD-10-CM | POA: Diagnosis not present

## 2022-05-02 DIAGNOSIS — Z3A01 Less than 8 weeks gestation of pregnancy: Secondary | ICD-10-CM

## 2022-05-02 DIAGNOSIS — M549 Dorsalgia, unspecified: Secondary | ICD-10-CM

## 2022-05-02 DIAGNOSIS — N898 Other specified noninflammatory disorders of vagina: Secondary | ICD-10-CM | POA: Insufficient documentation

## 2022-05-02 DIAGNOSIS — Z86711 Personal history of pulmonary embolism: Secondary | ICD-10-CM

## 2022-05-02 DIAGNOSIS — O24112 Pre-existing diabetes mellitus, type 2, in pregnancy, second trimester: Secondary | ICD-10-CM | POA: Diagnosis not present

## 2022-05-02 DIAGNOSIS — Z833 Family history of diabetes mellitus: Secondary | ICD-10-CM | POA: Insufficient documentation

## 2022-05-02 DIAGNOSIS — O09291 Supervision of pregnancy with other poor reproductive or obstetric history, first trimester: Secondary | ICD-10-CM | POA: Diagnosis not present

## 2022-05-02 LAB — WET PREP, GENITAL
Clue Cells Wet Prep HPF POC: NONE SEEN
Sperm: NONE SEEN
Trich, Wet Prep: NONE SEEN
WBC, Wet Prep HPF POC: <10 (ref <10)
Yeast Wet Prep HPF POC: NONE SEEN

## 2022-05-02 LAB — CBC
HCT: 28 % — ABNORMAL LOW (ref 36.0–46.0)
Hemoglobin: 8.1 g/dL — ABNORMAL LOW (ref 12.0–15.0)
MCH: 16.7 pg — ABNORMAL LOW (ref 26.0–34.0)
MCHC: 28.9 g/dL — ABNORMAL LOW (ref 30.0–36.0)
MCV: 57.6 fL — ABNORMAL LOW (ref 80.0–100.0)
Platelets: 500 10*3/uL — ABNORMAL HIGH (ref 150–400)
RBC: 4.86 MIL/uL (ref 3.87–5.11)
RDW: 22.3 % — ABNORMAL HIGH (ref 11.5–15.5)
WBC: 12.1 10*3/uL — ABNORMAL HIGH (ref 4.0–10.5)
nRBC: 0 % (ref 0.0–0.2)

## 2022-05-02 LAB — HCG, QUANTITATIVE, PREGNANCY: hCG, Beta Chain, Quant, S: 19890 m[IU]/mL — ABNORMAL HIGH (ref <5)

## 2022-05-02 MED ORDER — ENOXAPARIN SODIUM 40 MG/0.4ML IJ SOSY: 70.0000 mg | PREFILLED_SYRINGE | INTRAMUSCULAR | 2 refills | Status: DC

## 2022-05-02 MED ORDER — FAMOTIDINE 20 MG PO TABS: 20.0000 mg | ORAL_TABLET | Freq: Two times a day (BID) | ORAL | 0 refills | Status: DC

## 2022-05-02 MED ORDER — METOCLOPRAMIDE HCL 10 MG PO TABS: 10.0000 mg | ORAL_TABLET | Freq: Four times a day (QID) | ORAL | 0 refills | Status: DC

## 2022-05-02 MED ORDER — LABETALOL HCL 100 MG PO TABS: 200.0000 mg | ORAL_TABLET | Freq: Two times a day (BID) | ORAL | 3 refills | Status: DC

## 2022-05-02 NOTE — Discharge Instructions (Signed)

## 2022-05-02 NOTE — MAU Note (Addendum)
Virginia Fitzgerald is a 37 y.o. at [redacted]w[redacted]d here in MAU reporting: saw a little d/c this morning, small, brown and liquidy.  Has been having upper stomach pains and mid back pains since Thurs.   Onset of complaint: pain since Thurs, d/c this morning. Pain score: 7/8 Vitals:   05/02/22 1139  BP: (!) 149/85  Pulse: (!) 113  Resp: 18  Temp: 98.8 F (37.1 C)  SpO2: 97%      Lab orders placed from triage:  urine colllected  Completed rx for abscess on abd, reports is healing

## 2022-05-02 NOTE — MAU Provider Note (Signed)
History     CSN: [id number]  Arrival date and time: 05/02/22 1109   None     Chief Complaint  Patient presents with   Abdominal Pain   Back Pain   Vaginal Discharge   HPI Virginia Fitzgerald is a 37 y.o. [id number] at [redacted]w[redacted]d by LMP who presents to MAU for abdominal pain, back pain and vaginal discharge. She reports pain started several days ago. It is intermittent and she describes it as a burning sensation in mid-abdomen. She reports ongoing low back pain as well. Has not taken anything to relieve symptoms. She reports she woke up this morning and had some thin, brown discharge with a slight odor. She denies itching or urinary s/s. She has had some nausea with 1 episode of vomiting yesterday. LMP 2/18.   Pregnancy course: patient has an extensive PMHx complicated by cHTN, not currently on medications, T2DM no currently on medications, hx of chronic PE previously on Xarelto but stopped when she found out she was pregnant, morbid obesity, and history of IUFD at 15 weeks.  Patient is scheduled for appointment at [facility] on 4/22.   OB History     Gravida  6   Para  4   Term  3   Preterm  1   AB  1   Living  3      SAB  1   IAB  0   Ectopic  0   Multiple  0   Live Births  3        Obstetric Comments  Largest prior was 7lbs 7oz. No issues with deliveries.          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 03/28/2017   A1C 6.5    Fatty liver    GDM (gestational diabetes mellitus)    WITH 2019 PREGNANCY ONLY   GERD (gastroesophageal reflux disease)    diet controlled - no meds   Headache    last one 05/25/17 - otc med   History of blood transfusion 2012   At [facility]   History of DVT of lower extremity    History of hiatal hernia    Iron deficiency anemia [id number]   Metallic taste [id number]   MRSA infection    not in EPIC - per patient in 2011   Pulmonary embolism     S/P IVC filter    Thrombocytosis     Past Surgical History:  Procedure Laterality Date   DILATION AND EVACUATION N/A 05/29/2017   Procedure: DILATATION AND EVACUATION;  Surgeon: Lavonia Drafts, MD;  Location: [facility];  Service: Gynecology;  Laterality: N/A;   DILATION AND EVACUATION N/A 02/03/2021   Procedure: DILATATION AND EVACUATION;  Surgeon: Woodroe Mode, MD;  Location: [facility];  Service: Gynecology;  Laterality: N/A;   WISDOM TOOTH EXTRACTION      Family History  Problem Relation Age of Onset   Diabetes Mother    Hypertension Mother    Hypertension Maternal Uncle    Diabetes Maternal Uncle    Hypertension Maternal Grandmother     Social History   Tobacco Use   Smoking status: Never   Smokeless tobacco: Never   Tobacco comments:    never used tobacco  Vaping Use   Vaping Use: Never used  Substance Use Topics   Alcohol use: No    Alcohol/week: 0.0 standard drinks of  alcohol   Drug use: No   Allergies: No Known Allergies  No medications prior to admission.   Review of Systems  Constitutional: Negative.   Gastrointestinal:  Positive for abdominal pain.  Genitourinary:  Positive for vaginal discharge.  Musculoskeletal:  Positive for back pain.  All other systems reviewed and are negative.  Physical Exam   Patient Vitals for the past 24 hrs:  BP Temp Pulse Resp SpO2 Height Weight  05/02/22 1434 (!) 148/101 -- (!) 106 18 100 % -- --  05/02/22 1214 (!) 146/87 -- (!) 103 -- -- -- --  05/02/22 1139 (!) 149/85 98.8 F (37.1 C) (!) 113 18 97 % 5\' 2"  (1.575 m) 132.1 kg   Physical Exam Vitals and nursing note reviewed.  Constitutional:      General: She is not in acute distress.    Appearance: She is obese.  Cardiovascular:     Rate and Rhythm: Tachycardia present.  Pulmonary:     Effort: Pulmonary effort is normal. No respiratory distress.  Abdominal:     Palpations: Abdomen is soft.     Tenderness: There is no abdominal tenderness.   Genitourinary:    Comments: Blind swabs collected by RN Skin:    General: Skin is warm and dry.  Neurological:     General: No focal deficit present.     Mental Status: She is alert and oriented to person, place, and time.  Psychiatric:        Mood and Affect: Mood normal.        Behavior: Behavior normal.    Results for orders placed or performed during the hospital encounter of 05/02/22 (from the past 24 hour(s))  CBC     Status: Abnormal   Collection Time: 05/02/22 12:11 PM  Result Value Ref Range   WBC 12.1 (H) 4.0 - 10.5 K/uL   RBC 4.86 3.87 - 5.11 MIL/uL   Hemoglobin 8.1 (L) 12.0 - 15.0 g/dL   HCT 28.0 (L) 36.0 - 46.0 %   MCV 57.6 (L) 80.0 - 100.0 fL   MCH 16.7 (L) 26.0 - 34.0 pg   MCHC 28.9 (L) 30.0 - 36.0 g/dL   RDW 22.3 (H) 11.5 - 15.5 %   Platelets 500 (H) 150 - 400 K/uL   nRBC 0.0 0.0 - 0.2 %  hCG, quantitative, pregnancy     Status: Abnormal   Collection Time: 05/02/22 12:11 PM  Result Value Ref Range   hCG, Beta Chain, Quant, S 19,890 (H) <5 mIU/mL  Wet prep, genital     Status: None   Collection Time: 05/02/22 12:48 PM   Specimen: Vaginal  Result Value Ref Range   Yeast Wet Prep HPF POC NONE SEEN NONE SEEN   Trich, Wet Prep NONE SEEN NONE SEEN   Clue Cells Wet Prep HPF POC NONE SEEN NONE SEEN   WBC, Wet Prep HPF POC <10 <10   Sperm NONE SEEN    US OB LESS THAN 14 WEEKS WITH OB TRANSVAGINAL  Result Date: 05/02/2022 CLINICAL DATA:  Back pain, first trimester pregnancy EXAM: OBSTETRIC <14 WK [name] AND TRANSVAGINAL OB US TECHNIQUE: Both transabdominal and transvaginal ultrasound examinations were performed for complete evaluation of the gestation as well as the maternal uterus, adnexal regions, and pelvic cul-de-sac. Transvaginal technique was performed to assess early pregnancy. COMPARISON:  None Available. FINDINGS: Intrauterine gestational sac: Single Yolk sac:  Seen Embryo:  Seen Cardiac Activity: Seen Heart Rate: 114 bpm CRL:  2.5 mm   5 w  5 d                   [name] EDC: 12/28/2022 Subchorionic hemorrhage:  None visualized. Maternal uterus/adnexae: No acute findings are seen. IMPRESSION: Single live intrauterine pregnancy is seen. Sonographically estimated gestational age is 5 weeks 5 days. There are no dominant adnexal masses. There is no free fluid in pelvis. Electronically Signed   By: Elmer Picker M.D.   On: 05/02/2022 13:36    MAU Course  Procedures  MDM CBC, HCG Wet prep, GC/CT [name]  CBC with anemia present which is consist with past results. Patient is currently on iron supplementation. Wet prep negative, GC/CT pending. US shows SIUP with FHR present.  C/w Dr. Elly Modena regarding initiating Lovenox dosing. Will start patient on 70mg  daily. Given patient was previously on Labetalol in previous pregnancy, will restart on 200mg  BID as patient has had multiple BP's >140s/90s in recent MAU visits. Discussed with patient importance of compliance and taking as prescribed. Patient verbalizes understanding.  Assessment and Plan   1. [redacted] weeks gestation of pregnancy   2. Back pain affecting pregnancy in first trimester   3. Abdominal pain affecting pregnancy   4. Chronic hypertension affecting pregnancy   5. History of pulmonary embolus (PE)    - Discharge home in stable condition - Rx for Lovenox and Labetalol sent to pharmacy - Return precautions given. Return to MAU as needed for new/worsening symptoms - Keep OB appointment as scheduled   Renee Harder, CNM 05/02/2022, 2:50 PM

## 2022-05-03 LAB — GC/CHLAMYDIA PROBE AMP (~~LOC~~) NOT AT ARMC
Chlamydia: NEGATIVE
Comment: NEGATIVE
Comment: NORMAL
Neisseria Gonorrhea: NEGATIVE

## 2022-05-04 ENCOUNTER — Telehealth: Payer: Self-pay | Admitting: *Deleted

## 2022-05-04 DIAGNOSIS — Z86711 Personal history of pulmonary embolism: Secondary | ICD-10-CM

## 2022-05-04 DIAGNOSIS — O0990 Supervision of high risk pregnancy, unspecified, unspecified trimester: Secondary | ICD-10-CM

## 2022-05-04 MED ORDER — ENOXAPARIN SODIUM 60 MG/0.6ML IJ SOSY: 60.0000 mg | PREFILLED_SYRINGE | Freq: Two times a day (BID) | INTRAMUSCULAR | 11 refills | Status: DC

## 2022-05-04 NOTE — Telephone Encounter (Addendum)
Received 2 fax from [place]. One asked should Lovenox be 70mg /0.80ml and if Labetolol dose is 100mg  2 tabs 2 times a day. Second fax asked if can dispense Lovenox 80mg /0.8 ml so dose is in one syringe. And confirm labetolol dose. Consulted with [facility]  who recommended changing to 60mg . Also discussed with Dr. Crissie Reese who reviewed chart and gave order for Lovenox 60 mg BID. New RX sent to pharmacy. I called pharmacy and informed them new RX for Lovenox sent in and clarified Labetolol is 200 mg BID. They informed me they have already dispensed Labetolol with that instruction. I called Myrtha and informed her new RX for Lovenox for 60mg  BID. I asked if she  has given herself Lovenox injections and she said she has in the past. I asked if she was comfortable or if she needed reinstruction and offered appointment with pharmacist. She states she is comfortable and declines reinstruction. Nancy Fetter

## 2022-05-08 ENCOUNTER — Other Ambulatory Visit: Payer: Self-pay

## 2022-05-08 ENCOUNTER — Ambulatory Visit
Admission: EM | Admit: 2022-05-08 | Discharge: 2022-05-08 | Disposition: A | Discharge status: Home / Self Care | Payer: BC Managed Care – PPO | Attending: Family Medicine | Admitting: Family Medicine

## 2022-05-08 ENCOUNTER — Encounter: Payer: Self-pay | Admitting: Emergency Medicine

## 2022-05-08 DIAGNOSIS — R519 Headache, unspecified: Secondary | ICD-10-CM | POA: Diagnosis not present

## 2022-05-08 DIAGNOSIS — R42 Dizziness and giddiness: Secondary | ICD-10-CM | POA: Diagnosis not present

## 2022-05-08 NOTE — ED Provider Notes (Signed)
[facility]    CSN: [id number] Arrival date & time: 05/08/22  1453      History   Chief Complaint Chief Complaint  Patient presents with   Facial Pain    HPI Virginia Fitzgerald is a 37 y.o. female.   HPI Here for lightheadedness and pressure in her central face began about 2 this afternoon, about 3 hours ago.  She does not really feel she has any nasal congestion or fever.  No vomiting or nausea or diarrhea.  She states that she has had this set of symptoms happen several times a week in the last year or 2.  It usually lasts few hours.  No associated nasal congestion or fever or vomiting.  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 03/28/2017   A1C 6.5    Fatty liver    GDM (gestational diabetes mellitus)    WITH 2019 PREGNANCY ONLY   GERD (gastroesophageal reflux disease)    diet controlled - no meds   Headache    last one 05/25/17 - otc med   History of blood transfusion 2012   At [facility]   History of DVT of lower extremity    History of hiatal hernia    Iron deficiency anemia 11/14/2011   Metallic taste 01/14/2020   MRSA infection    not in EPIC - per patient in 2011   Pulmonary embolism    S/P IVC filter    Thrombocytosis     Patient Active Problem List   Diagnosis Date Noted   IUFD at 20 weeks or more of gestation 02/02/2021   Chronic hypertension in pregnancy 01/14/2021   Insulin-requiring or dependent type II diabetes mellitus 01/14/2021   Chronic pulmonary embolism without acute cor pulmonale 01/14/2021   Mixed hyperlipidemia 01/14/2021   Health education/counseling 01/14/2021   IUGR (intrauterine growth restriction) affecting care of mother 12/08/2020   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   S/P IVC filter 12/05/2020   Elevated AFP  11/24/2020   Low fetal fraction on Panorama at 10 wks 11/21/2020   Anticoagulation in pregnancy 10/20/2020   Anemia in pregnancy 10/20/2020   Rubella non-immune status, antepartum 10/20/2020   Pre-existing type 2 diabetes mellitus during pregnancy, antepartum 10/19/2020   AMA (advanced maternal age) multigravida 35+, first trimester 10/19/2020   Allergies 01/14/2020   Abnormal chest x-ray 12/28/2019   History of COVID-19 11/25/2019   Type 2 diabetes mellitus without complication, without long-term current use of insulin 11/25/2019   History of recurrent deep vein thrombosis (DVT) 11/25/2019   Diabetes type 2, 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 03/28/2017   Essential hypertension, benign 03/20/2017   Obesity in pregnancy 03/20/2017   Recurrent pulmonary embolism 03/20/2017   BMI 50.0-59.9, adult 02/28/2017    Past Surgical History:  Procedure Laterality Date   DILATION AND EVACUATION N/A 05/29/2017   Procedure: DILATATION AND EVACUATION;  Surgeon: Willodean Rosenthal, MD;  Location: [facility];  Service: Gynecology;  Laterality: N/A;   DILATION AND EVACUATION N/A 02/03/2021   Procedure: DILATATION AND EVACUATION;  Surgeon: Adam Phenix, MD;  Location: [facility];  Service: Gynecology;  Laterality: N/A;   WISDOM TOOTH EXTRACTION      OB History     Gravida  6   Para  4   Term  3   Preterm  1   AB  1   Living  3      SAB  1   IAB  0   Ectopic  0   Multiple  0   Live Births  3        Obstetric Comments  Largest prior was 7lbs 7oz. No issues with deliveries.           Home Medications    Prior to Admission medications   Medication Sig Start Date End Date Taking? Authorizing Provider  acetaminophen (TYLENOL) 325 MG tablet Take 2 tablets (650 mg total) by mouth every 4 (four) hours as needed (for pain scale < 4). 02/03/21   Milas Hockuncan, Paula, MD  cephALEXin (KEFLEX) 500 MG capsule Take 1 capsule (500 mg total) by mouth 4 (four) times daily. 04/20/22   Gerrit HeckEmly, Jessica, CNM   cetirizine (ZYRTEC ALLERGY) 10 MG tablet Take 1 tablet (10 mg total) by mouth daily. Patient not taking: Reported on 11/15/2020 01/14/20   Defelice, Para MarchJeanette, NP  enoxaparin (LOVENOX) 60 MG/0.6ML injection Inject 0.6 mLs (60 mg total) into the skin every 12 (twelve) hours. 05/04/22   Venora MaplesEckstat, Matthew M, MD  famotidine (PEPCID) 20 MG tablet Take 1 tablet (20 mg total) by mouth 2 (two) times daily. 05/02/22   Brand MalesSimpson, Danielle L, CNM  ferrous gluconate (FERGON) 324 MG tablet Take 1 tablet (324 mg total) by mouth every other day. 04/20/22   Gerrit HeckEmly, Jessica, CNM  insulin aspart (NOVOLOG FLEXPEN) 100 UNIT/ML FlexPen Inject 0-9 Units into the skin 3 (three) times daily with meals. Patient not taking: Reported on 04/20/2022 02/03/21   Milas Hockuncan, Paula, MD  insulin detemir (LEVEMIR) 100 UNIT/ML FlexPen Inject 20 Units into the skin daily. Patient not taking: Reported on 04/20/2022 02/03/21   Milas Hockuncan, Paula, MD  Insulin Pen Needle (NOVOFINE) 30G X 8 MM MISC Inject 10 each into the skin as needed. Patient not taking: Reported on 04/20/2022 02/03/21   Milas Hockuncan, Paula, MD  labetalol (NORMODYNE) 100 MG tablet Take 2 tablets (200 mg total) by mouth 2 (two) times daily. 05/02/22   Brand MalesSimpson, Danielle L, CNM  metoCLOPramide (REGLAN) 10 MG tablet Take 1 tablet (10 mg total) by mouth every 6 (six) hours. 05/02/22   Brand MalesSimpson, Danielle L, CNM  Blood Pressure Monitoring (BLOOD PRESSURE KIT) DEVI 1 Device by Does not apply route as needed. Patient not taking: No sig reported 10/19/20   Belmar BingPickens, Charlie, MD    Family History Family History  Problem Relation Age of Onset   Diabetes Mother    Hypertension Mother    Hypertension Maternal Uncle    Diabetes Maternal Uncle    Hypertension Maternal Grandmother     Social History Social History   Tobacco Use   Smoking status: Never   Smokeless tobacco: Never   Tobacco comments:    never used tobacco  Vaping Use   Vaping Use: Never used  Substance Use Topics   Alcohol use: No     Alcohol/week: 0.0 standard drinks of alcohol   Drug use: No     Allergies   Patient has no known allergies.   Review of Systems Review of Systems   Physical Exam Triage Vital Signs ED Triage Vitals  Enc Vitals Group     BP 05/08/22 1624 139/74     Pulse Rate 05/08/22 1624 (!) 105     Resp 05/08/22 1624 18     Temp 05/08/22 1624 97.7 F (36.5  C)     Temp Source 05/08/22 1624 Oral     SpO2 05/08/22 1624 96 %     Weight --      Height --      Head Circumference --      Peak Flow --      Pain Score 05/08/22 1625 5     Pain Loc --      Pain Edu? --      Excl. in GC? --    No data found.  Updated Vital Signs BP 139/74 (BP Location: Left Arm)   Pulse (!) 105   Temp 97.7 F (36.5 C) (Oral)   Resp 18   LMP 03/18/2022 (Exact Date)   SpO2 96%   Visual Acuity Right Eye Distance:   Left Eye Distance:   Bilateral Distance:    Right Eye Near:   Left Eye Near:    Bilateral Near:     Physical Exam   UC Treatments / Results  Labs (all labs ordered are listed, but only abnormal results are displayed) Labs Reviewed - No data to display  EKG   Radiology No results found.  Procedures Procedures (including critical care time)  Medications Ordered in UC Medications - No data to display  Initial Impression / Assessment and Plan / UC Course  I have reviewed the triage vital signs and the nursing notes.  Pertinent labs & imaging results that were available during my care of the patient were reviewed by me and considered in my medical decision making (see chart for details).        It is possible she is having some sort of mild cluster headache type syndrome. I was going to do a blood count, but she stated she already knows she is anemic, and she did just have a CBC done on April 3, that showed a hemoglobin of 8.1 BMP is drawn today, we will notify that she thinks significant on that.  I have asked her to take Tylenol as needed and to discuss this with her  regular doctors.  Final Clinical Impressions(s) / UC Diagnoses   Final diagnoses:  None   Discharge Instructions   None    ED Prescriptions   None    PDMP not reviewed this encounter.   Zenia Resides, MD 05/08/22 [phone]

## 2022-05-08 NOTE — ED Triage Notes (Signed)
Pt here for facial pain in her nose and some HA x weeks; pt sts is currently [redacted] weeks pregnant

## 2022-05-08 NOTE — Discharge Instructions (Signed)
We have drawn blood to check your sodium and potassium and sugar.  Staff will notify you if there is anything significantly abnormal on this.  I did not redo your blood counts and she just had that done 6 days ago.  You are anemic as you said.  Please make sure you are taking your iron supplements as directed by your OB/GYN.  Take Tylenol as needed.  Please follow-up with your primary care and OB/GYN about this issue

## 2022-05-21 ENCOUNTER — Other Ambulatory Visit (INDEPENDENT_AMBULATORY_CARE_PROVIDER_SITE_OTHER): Payer: BC Managed Care – PPO

## 2022-05-21 ENCOUNTER — Telehealth: Payer: Self-pay | Admitting: *Deleted

## 2022-05-21 ENCOUNTER — Ambulatory Visit (INDEPENDENT_AMBULATORY_CARE_PROVIDER_SITE_OTHER): Payer: BC Managed Care – PPO | Admitting: *Deleted

## 2022-05-21 VITALS — BP 128/83 | HR 93 | Wt 286.4 lb

## 2022-05-21 DIAGNOSIS — O0990 Supervision of high risk pregnancy, unspecified, unspecified trimester: Secondary | ICD-10-CM | POA: Insufficient documentation

## 2022-05-21 DIAGNOSIS — Z3A08 8 weeks gestation of pregnancy: Secondary | ICD-10-CM

## 2022-05-21 DIAGNOSIS — O3680X0 Pregnancy with inconclusive fetal viability, not applicable or unspecified: Secondary | ICD-10-CM

## 2022-05-21 DIAGNOSIS — O0991 Supervision of high risk pregnancy, unspecified, first trimester: Secondary | ICD-10-CM

## 2022-05-21 DIAGNOSIS — Z3A09 9 weeks gestation of pregnancy: Secondary | ICD-10-CM

## 2022-05-21 DIAGNOSIS — E119 Type 2 diabetes mellitus without complications: Secondary | ICD-10-CM

## 2022-05-21 DIAGNOSIS — O169 Unspecified maternal hypertension, unspecified trimester: Secondary | ICD-10-CM

## 2022-05-21 MED ORDER — PROMETHAZINE HCL 25 MG PO TABS: 25.0000 mg | ORAL_TABLET | Freq: Four times a day (QID) | ORAL | 1 refills | Status: DC | PRN

## 2022-05-21 NOTE — Telephone Encounter (Signed)
Pt reported dizziness with taking labetalol 200 mg BID. Discussed with Dr. Jolayne Panther. Advised pt to take 200 mg at HS for the next week until follow up at [facility]. Advised pt that dizziness is likely due to body adjusting to normal range BP. Per pt previous instructions, above advise left on VM. Call back number included.

## 2022-05-21 NOTE — Progress Notes (Signed)
New OB Intake  I connected with Virginia Fitzgerald  on 05/21/22 at 10:15 AM EDT by In Person Visit and verified that I am speaking with the correct person using two identifiers. Nurse is located at [facility] and pt is located at [place].  I discussed the limitations, risks, security and privacy concerns of performing an evaluation and management service by telephone and the availability of in person appointments. I also discussed with the patient that there may be a patient responsible charge related to this service. The patient expressed understanding and agreed to proceed.  I explained I am completing New OB Intake today. We discussed EDD of 12/23/22 that is based on LMP of 03/18/22. Pt is G6/P3. I reviewed her allergies, medications, Medical/Surgical/OB history, and appropriate screenings. I informed her of [facility] services. [facility] information placed in AVS. Based on history, this is a high risk pregnancy.  Patient Active Problem List   Diagnosis Date Noted   IUFD at 20 weeks or more of gestation 02/02/2021   Chronic hypertension in pregnancy 01/14/2021   Insulin-requiring or dependent type II diabetes mellitus 01/14/2021   Chronic pulmonary embolism without acute cor pulmonale 01/14/2021   Mixed hyperlipidemia 01/14/2021   Health education/counseling 01/14/2021   IUGR (intrauterine growth restriction) affecting care of mother 12/08/2020   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   S/P IVC filter 12/05/2020   Elevated AFP 11/24/2020   Low fetal fraction on Panorama at 10 wks 11/21/2020   Anticoagulation in pregnancy 10/20/2020   Anemia in pregnancy 10/20/2020   Rubella non-immune status, antepartum 10/20/2020   Pre-existing type 2 diabetes mellitus during pregnancy, antepartum 10/19/2020   AMA (advanced maternal age) multigravida 35+, first trimester 10/19/2020   Allergies 01/14/2020   Abnormal chest x-ray 12/28/2019   History of COVID-19 11/25/2019   Type 2 diabetes mellitus without complication,  without long-term current use of insulin 11/25/2019   History of recurrent deep vein thrombosis (DVT) 11/25/2019   Diabetes type 2, 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 Patient given information for [facility] Healthy [facility] website for more information [facility]. Patient  is not a candidate for water birth.  MyChart/Babyscripts MyChart access verified. I explained pt will have some visits in office and some virtually. Babyscripts instructions given and order placed. Patient verifies receipt of registration text/e-mail. Account successfully created and app downloaded.  Blood Pressure Cuff/Weight Scale Patient has private insurance; instructed to purchase blood pressure cuff and bring to first prenatal appt. Explained after first prenatal appt pt will check weekly and document in Babyscripts. Patient does not have weight scale; patient may purchase if they desire to track weight weekly in Babyscripts.  Anatomy US Explained first scheduled [name] will be around 19 weeks. Anatomy US scheduled for 19 wks at [facility]. Pt notified to arrive at [facility].  Labs Discussed Virginia Fitzgerald genetic screening with patient. Would like both Panorama and Horizon drawn at new OB visit. Routine prenatal labs needed.  COVID Vaccine Patient has not had COVID vaccine.   Social Determinants of Health Food Insecurity: Patient denies food insecurity. WIC Referral: Patient is interested in referral to [facility].  Transportation: Patient denies transportation needs. Childcare: Discussed no children allowed at ultrasound appointments. Offered childcare services; patient declines childcare services at this time.  Interested in [place]?  If yes, send referral and doula dot phrase.   First visit  review I reviewed new OB appt with patient. I explained they will have a provider visit that includes labs, genetic screening, discussion. Explained pt will be seen by Dr. Donavan Foil at first visit; encounter routed to appropriate provider. Explained that patient will be seen by pregnancy navigator following visit with provider.   Harrel Lemon, RN 05/21/2022  10:33 AM

## 2022-05-22 ENCOUNTER — Emergency Department (HOSPITAL_COMMUNITY)
Admission: EM | Admit: 2022-05-22 | Discharge: 2022-05-23 | Disposition: A | Discharge status: Home / Self Care | Payer: Worker's Compensation | Attending: Student | Admitting: Student

## 2022-05-22 ENCOUNTER — Other Ambulatory Visit: Payer: Self-pay

## 2022-05-22 ENCOUNTER — Ambulatory Visit (INDEPENDENT_AMBULATORY_CARE_PROVIDER_SITE_OTHER): Payer: BC Managed Care – PPO | Admitting: Podiatry

## 2022-05-22 ENCOUNTER — Encounter (HOSPITAL_COMMUNITY): Payer: Self-pay

## 2022-05-22 ENCOUNTER — Telehealth: Payer: Self-pay | Admitting: *Deleted

## 2022-05-22 DIAGNOSIS — Y990 Civilian activity done for income or pay: Secondary | ICD-10-CM | POA: Diagnosis not present

## 2022-05-22 DIAGNOSIS — W268XXA Contact with other sharp object(s), not elsewhere classified, initial encounter: Secondary | ICD-10-CM | POA: Diagnosis not present

## 2022-05-22 DIAGNOSIS — Z23 Encounter for immunization: Secondary | ICD-10-CM | POA: Insufficient documentation

## 2022-05-22 DIAGNOSIS — S60415A Abrasion of left ring finger, initial encounter: Secondary | ICD-10-CM | POA: Diagnosis not present

## 2022-05-22 DIAGNOSIS — S6992XA Unspecified injury of left wrist, hand and finger(s), initial encounter: Secondary | ICD-10-CM | POA: Diagnosis present

## 2022-05-22 DIAGNOSIS — S60419A Abrasion of unspecified finger, initial encounter: Secondary | ICD-10-CM

## 2022-05-22 DIAGNOSIS — Z794 Long term (current) use of insulin: Secondary | ICD-10-CM | POA: Diagnosis not present

## 2022-05-22 DIAGNOSIS — Z91199 Patient's noncompliance with other medical treatment and regimen due to unspecified reason: Secondary | ICD-10-CM

## 2022-05-22 NOTE — Progress Notes (Signed)
No show

## 2022-05-22 NOTE — Telephone Encounter (Signed)
TC to confirm pt reviewed msg from 05/21/22 regarding change in BP meds. Pt verbalized understanding and will f/u as scheduled for New OB appt.

## 2022-05-22 NOTE — ED Triage Notes (Signed)
Patient reports she was at work tonight and she cut her left ring finger on tape blade. States she is on lovenox and was bleeding a lot. Patient reports her boss placed liquid band-aid on wound and attempted to take guaze off patient at this time but gauze stuck to glue. Attempted to soak off with saline and was able to get most off but unable to get all of dressing off at this time. New dressing placed over it to control any bleeding.

## 2022-05-23 MED ORDER — TETANUS-DIPHTH-ACELL PERTUSSIS 5-2.5-18.5 LF-MCG/0.5 IM SUSY
0.5000 mL | PREFILLED_SYRINGE | Freq: Once | INTRAMUSCULAR | Status: AC
  Administered 2022-05-23: 0.5 mL via INTRAMUSCULAR
  Filled 2022-05-23: qty 0.5

## 2022-05-23 MED ORDER — BACITRACIN ZINC 500 UNIT/GM EX OINT
TOPICAL_OINTMENT | Freq: Two times a day (BID) | CUTANEOUS | Status: DC
  Administered 2022-05-23: 1 via TOPICAL
  Filled 2022-05-23: qty 1.8

## 2022-05-23 MED ORDER — BACITRACIN ZINC 500 UNIT/GM EX OINT: 1.0000 | TOPICAL_OINTMENT | Freq: Two times a day (BID) | CUTANEOUS | 0 refills | Status: DC

## 2022-05-23 NOTE — Discharge Instructions (Addendum)
Evaluation for your finger injury revealed a superficial abrasion to your left fourth finger.  Recommend that you keep the abrasion dry for the next 24 hours.  You can take off the dressing in 24 hours and gently wash with soap and water then place bacitracin for the next 3 to 4 days.  You can also place nonadherent dressings at home to cover the wound during the day.  Recommend you follow-up with your PCP for reevaluation in 3 to 4 days.  I have given you the Tdap booster.  I have verified that it is safe in pregnancy per our pharmacist.

## 2022-05-23 NOTE — ED Provider Notes (Signed)
[place] EMERGENCY DEPARTMENT AT [facility] Provider Note   CSN: [id number] Arrival date & time: 05/22/22  2208     History  Chief Complaint  Patient presents with   Laceration   HPI Virginia Fitzgerald is a 37 y.o. female with history of DVT on Lovenox and actively pregnant presenting for finger injury.  This occurred at work around around 3 PM today.  Patient cut her left ring finger on a Tate blade at work.  States it was initially bleeding a lot but controlled with with gauze and direct pressure.  Patient also states that her supervisor placed liquid Band-Aid on wound attempted to take gauze off patient at this time but gauze stuck to the glue.  Patient soaked her finger in saline and was able to get most of the glue off of her finger.  I then placed a new dressing and the bleeding was controlled.  Patient unsure when her last tetanus shot was.   Laceration      Home Medications Prior to Admission medications   Medication Sig Start Date End Date Taking? Authorizing Provider  acetaminophen (TYLENOL) 325 MG tablet Take 2 tablets (650 mg total) by mouth every 4 (four) hours as needed (for pain scale < 4). 02/03/21   Milas Hock, MD  cephALEXin (KEFLEX) 500 MG capsule Take 1 capsule (500 mg total) by mouth 4 (four) times daily. 04/20/22   Gerrit Heck, CNM  cetirizine (ZYRTEC ALLERGY) 10 MG tablet Take 1 tablet (10 mg total) by mouth daily. Patient not taking: Reported on 11/15/2020 01/14/20   Defelice, Para March, NP  enoxaparin (LOVENOX) 60 MG/0.6ML injection Inject 0.6 mLs (60 mg total) into the skin every 12 (twelve) hours. 05/04/22   Venora Maples, MD  famotidine (PEPCID) 20 MG tablet Take 1 tablet (20 mg total) by mouth 2 (two) times daily. 05/02/22   Brand Males, CNM  ferrous gluconate (FERGON) 324 MG tablet Take 1 tablet (324 mg total) by mouth every other day. 04/20/22   Gerrit Heck, CNM  insulin aspart (NOVOLOG FLEXPEN) 100 UNIT/ML FlexPen Inject 0-9  Units into the skin 3 (three) times daily with meals. Patient not taking: Reported on 04/20/2022 02/03/21   Milas Hock, MD  insulin detemir (LEVEMIR) 100 UNIT/ML FlexPen Inject 20 Units into the skin daily. Patient not taking: Reported on 04/20/2022 02/03/21   Milas Hock, MD  Insulin Pen Needle (NOVOFINE) 30G X 8 MM MISC Inject 10 each into the skin as needed. Patient not taking: Reported on 04/20/2022 02/03/21   Milas Hock, MD  labetalol (NORMODYNE) 100 MG tablet Take 2 tablets (200 mg total) by mouth 2 (two) times daily. Patient taking differently: Take 200 mg by mouth 2 (two) times daily. Taking differently 05/02/22   Brand Males, CNM  metoCLOPramide (REGLAN) 10 MG tablet Take 1 tablet (10 mg total) by mouth every 6 (six) hours. Patient not taking: Reported on 05/21/2022 05/02/22   Brand Males, CNM  promethazine (PHENERGAN) 25 MG tablet Take 1 tablet (25 mg total) by mouth every 6 (six) hours as needed for nausea or vomiting. 05/21/22   Constant, Peggy, MD  Blood Pressure Monitoring (BLOOD PRESSURE KIT) DEVI 1 Device by Does not apply route as needed. Patient not taking: No sig reported 10/19/20   [place] Bing, MD      Allergies    Patient has no known allergies.    Review of Systems   See HPI for pertinent positives  Physical Exam Updated Vital Signs  BP (!) 152/79 (BP Location: Right Arm)   Pulse 93   Temp 98.4 F (36.9 C) (Oral)   Resp 18   Ht  (1.6 m)   Wt 129.7 kg   LMP 03/18/2022 (Exact Date)   SpO2 100%   BMI 50.66 kg/m  Physical Exam Constitutional:      Appearance: Normal appearance.  HENT:     Head: Normocephalic.     Nose: Nose normal.  Eyes:     Conjunctiva/sclera: Conjunctivae normal.  Pulmonary:     Effort: Pulmonary effort is normal.  Musculoskeletal:       Arms:  Neurological:     Mental Status: She is alert.  Psychiatric:        Mood and Affect: Mood normal.     ED Results / Procedures / Treatments   Labs (all labs ordered  are listed, but only abnormal results are displayed) Labs Reviewed - No data to display  EKG None  Radiology No results found.  Procedures Procedures    Medications Ordered in ED Medications  Tdap (BOOSTRIX) injection 0.5 mL (has no administration in time range)  bacitracin ointment (has no administration in time range)    ED Course/ Medical Decision Making/ A&P                             Medical Decision Making  37 year old well-appearing female presenting for finger injury.  Exam remarkable for a superficial abrasion on the left fourth finger.  Clean the wound, applied bacitracin, and applied nonadherent dressing.  Also verified with pharmacy that Tdap booster is okay in pregnancy.  Gave Tdap booster.  Advised her to follow-up with her PCP for reevaluation of wound in 3 to 4 days.        Final Clinical Impression(s) / ED Diagnoses Final diagnoses:  Abrasion of finger, initial encounter    Rx / DC Orders ED Discharge Orders     None         Gareth Eagle, PA-C 05/23/22 0215    Glendora Score, MD 05/23/22 1758

## 2022-05-23 NOTE — ED Notes (Signed)
Successfully removed bandage placed at work w/ sterile water. Bleeding controlled. No obvious place to suture.

## 2022-05-24 LAB — CULTURE, OB URINE

## 2022-05-24 LAB — URINE CULTURE, OB REFLEX

## 2022-06-01 ENCOUNTER — Encounter (HOSPITAL_COMMUNITY): Payer: Self-pay | Admitting: Obstetrics and Gynecology

## 2022-06-01 ENCOUNTER — Other Ambulatory Visit (INDEPENDENT_AMBULATORY_CARE_PROVIDER_SITE_OTHER): Payer: BC Managed Care – PPO

## 2022-06-01 ENCOUNTER — Telehealth: Payer: Self-pay

## 2022-06-01 ENCOUNTER — Ambulatory Visit (INDEPENDENT_AMBULATORY_CARE_PROVIDER_SITE_OTHER): Payer: BC Managed Care – PPO | Admitting: Licensed Clinical Social Worker

## 2022-06-01 ENCOUNTER — Other Ambulatory Visit: Payer: Self-pay | Admitting: Obstetrics and Gynecology

## 2022-06-01 ENCOUNTER — Ambulatory Visit (INDEPENDENT_AMBULATORY_CARE_PROVIDER_SITE_OTHER): Payer: BC Managed Care – PPO | Admitting: Obstetrics and Gynecology

## 2022-06-01 VITALS — BP 126/80 | HR 97 | Wt 289.0 lb

## 2022-06-01 DIAGNOSIS — O021 Missed abortion: Secondary | ICD-10-CM | POA: Diagnosis not present

## 2022-06-01 DIAGNOSIS — E119 Type 2 diabetes mellitus without complications: Secondary | ICD-10-CM

## 2022-06-01 DIAGNOSIS — I2699 Other pulmonary embolism without acute cor pulmonale: Secondary | ICD-10-CM

## 2022-06-01 DIAGNOSIS — Z6841 Body Mass Index (BMI) 40.0 and over, adult: Secondary | ICD-10-CM

## 2022-06-01 DIAGNOSIS — Z3A10 10 weeks gestation of pregnancy: Secondary | ICD-10-CM

## 2022-06-01 DIAGNOSIS — Z01812 Encounter for preprocedural laboratory examination: Secondary | ICD-10-CM

## 2022-06-01 DIAGNOSIS — F4321 Adjustment disorder with depressed mood: Secondary | ICD-10-CM | POA: Diagnosis not present

## 2022-06-01 DIAGNOSIS — O0990 Supervision of high risk pregnancy, unspecified, unspecified trimester: Secondary | ICD-10-CM

## 2022-06-01 DIAGNOSIS — O99210 Obesity complicating pregnancy, unspecified trimester: Secondary | ICD-10-CM

## 2022-06-01 DIAGNOSIS — Z3A11 11 weeks gestation of pregnancy: Secondary | ICD-10-CM

## 2022-06-01 DIAGNOSIS — Z8759 Personal history of other complications of pregnancy, childbirth and the puerperium: Secondary | ICD-10-CM | POA: Insufficient documentation

## 2022-06-01 DIAGNOSIS — O3680X0 Pregnancy with inconclusive fetal viability, not applicable or unspecified: Secondary | ICD-10-CM

## 2022-06-01 DIAGNOSIS — O09521 Supervision of elderly multigravida, first trimester: Secondary | ICD-10-CM

## 2022-06-01 DIAGNOSIS — O09899 Supervision of other high risk pregnancies, unspecified trimester: Secondary | ICD-10-CM

## 2022-06-01 DIAGNOSIS — O10919 Unspecified pre-existing hypertension complicating pregnancy, unspecified trimester: Secondary | ICD-10-CM

## 2022-06-01 NOTE — Progress Notes (Unsigned)
New OB being seen today for first visit by Dr. Donavan Foil. Formal viability scan requested. No FM detected on hand held [name].  Viability scan performed. No FM or FHR observed. Dr. Donavan Foil returned to bedside to inform pt of failed pregnancy and options for POC.

## 2022-06-01 NOTE — Progress Notes (Signed)
NOB, c/o back pain 7-8/10 x 2 weeks.

## 2022-06-01 NOTE — Telephone Encounter (Signed)
Called patient, no answer, left voicemail with surgery date, time, location, preop insturctions and call back number.

## 2022-06-01 NOTE — Progress Notes (Signed)
Ms Gannaway denies chest pain or shortness of breath. Patient denies having any s/s of Covid in her household, also denies any known exposure to Covid.  Ms Lathe any s/s of upper or lower respiratory in the past 8 weeks.   Ms Nurnberger no longer has a PCP, the last one was with [facility]. Patient states that [facility] had told her that she no longer needs to take medications for diabetes, that her glucose labs are good.  I could not find labs in [facility] notes.  Ms Schwander states that she no longer has a glucose monitor.  I encouraged patient to find a new PCP and take better care of herself.

## 2022-06-03 NOTE — Progress Notes (Signed)
INITIAL PRENATAL VISIT NOTE  Subjective:  Virginia Fitzgerald is a 37 y.o. Z6X0960 at [redacted]w[redacted]d by LMP being seen today for her initial prenatal visit.  She has an obstetric history significant for SVD x 4. She has a medical history significant for uncontrolled type 2 diabetes, hx of DVT/PE , chronic anticoagulation, HTN and morbid obesity.  Patient reports no complaints.  Contractions: Not present. Vag. Bleeding: None.   . Denies leaking of fluid.    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   A1C 6.5    Fatty liver    GDM (gestational diabetes mellitus)    WITH 2019 PREGNANCY ONLY   GERD (gastroesophageal reflux disease)    diet controlled - no meds   Headache    last one 05/25/17 - otc med   History of blood transfusion 2012   At [facility]   History of DVT of lower extremity    History of hiatal hernia    Hypertension    Iron deficiency anemia 11/14/2011   Metallic taste 01/14/2020   MRSA infection    not in EPIC - per patient in 2011   Pulmonary embolism (HCC)    S/P IVC filter    Thrombocytosis     Past Surgical History:  Procedure Laterality Date   DILATION AND EVACUATION N/A 05/29/2017   Procedure: DILATATION AND EVACUATION;  Surgeon: Willodean Rosenthal, MD;  Location: [facility] ORS;  Service: Gynecology;  Laterality: N/A;   DILATION AND EVACUATION N/A 02/03/2021   Procedure: DILATATION AND EVACUATION;  Surgeon: Adam Phenix, MD;  Location: [facility] LD ORS;  Service: Gynecology;  Laterality: N/A;   WISDOM TOOTH EXTRACTION      OB History  Gravida Para Term Preterm AB Living  6 4 3 1 1 3   SAB IAB Ectopic Multiple Live Births  1 0 0 0 3    # Outcome Date GA Lbr Len/2nd Weight Sex Delivery Anes PTL Lv  6 Gravida           5 Preterm 02/03/21 [redacted]w[redacted]d / 00:05 12.7 oz (0.36 kg) M Vag-Spont EPI  FD  4 SAB 05/29/17 [redacted]w[redacted]d         3 Term 03/20/09    M Vag-Spont    LIV  2 Term 04/07/08    M Vag-Spont   LIV  1 Term 06/28/02    F Vag-Spont   LIV    Obstetric Comments  Largest prior was 7lbs 7oz. No issues with deliveries.     Social History   Socioeconomic History   Marital status: Single    Spouse name: Not on file   Number of children: Not on file   Years of education: Not on file   Highest education level: Not on file  Occupational History   Not on file  Tobacco Use   Smoking status: Never   Smokeless tobacco: Never   Tobacco comments:    never used tobacco  Vaping Use   Vaping Use: Never used  Substance and Sexual Activity   Alcohol use: No    Alcohol/week: 0.0 standard drinks of alcohol   Drug use: No   Sexual activity: Yes    Birth control/protection: None  Other Topics Concern   Not on file  Social History Narrative   ** Merged History Encounter **       Social Determinants  of Health   Financial Resource Strain: Not on file  Food Insecurity: Food Insecurity Present (12/09/2020)   Hunger Vital Sign    Worried About Running Out of Food in the Last Year: Sometimes true    Ran Out of Food in the Last Year: Never true  Transportation Needs: No Transportation Needs (12/09/2020)   PRAPARE - Administrator, Civil Service (Medical): No    Lack of Transportation (Non-Medical): No  Physical Activity: Not on file  Stress: Not on file  Social Connections: Not on file    Family History  Problem Relation Age of Onset   Diabetes Mother    Hypertension Mother    Hypertension Maternal Uncle    Diabetes Maternal Uncle    Hypertension Maternal Grandmother      Current Outpatient Medications:    acetaminophen (TYLENOL) 325 MG tablet, Take 2 tablets (650 mg total) by mouth every 4 (four) hours as needed (for pain scale < 4)., Disp: 60 tablet, Rfl: 1   bacitracin ointment, Apply 1 Application topically 2 (two) times daily., Disp: 120 g, Rfl: 0   cephALEXin (KEFLEX) 500 MG capsule, Take 1 capsule (500 mg total) by mouth  4 (four) times daily., Disp: 20 capsule, Rfl: 0   cetirizine (ZYRTEC ALLERGY) 10 MG tablet, Take 1 tablet (10 mg total) by mouth daily. (Patient not taking: Reported on 11/15/2020), Disp: 14 tablet, Rfl: 0   enoxaparin (LOVENOX) 60 MG/0.6ML injection, Inject 0.6 mLs (60 mg total) into the skin every 12 (twelve) hours., Disp: 60 mL, Rfl: 11   famotidine (PEPCID) 20 MG tablet, Take 1 tablet (20 mg total) by mouth 2 (two) times daily., Disp: 30 tablet, Rfl: 0   ferrous gluconate (FERGON) 324 MG tablet, Take 1 tablet (324 mg total) by mouth every other day., Disp: 60 tablet, Rfl: 1   insulin aspart (NOVOLOG FLEXPEN) 100 UNIT/ML FlexPen, Inject 0-9 Units into the skin 3 (three) times daily with meals. (Patient not taking: Reported on 04/20/2022), Disp: 15 mL, Rfl: 1   insulin detemir (LEVEMIR) 100 UNIT/ML FlexPen, Inject 20 Units into the skin daily. (Patient not taking: Reported on 04/20/2022), Disp: 15 mL, Rfl: 1   Insulin Pen Needle (NOVOFINE) 30G X 8 MM MISC, Inject 10 each into the skin as needed. (Patient not taking: Reported on 04/20/2022), Disp: 3 packet, Rfl: 2   labetalol (NORMODYNE) 100 MG tablet, Take 2 tablets (200 mg total) by mouth 2 (two) times daily. (Patient taking differently: Take 200 mg by mouth 2 (two) times daily. Taking differently), Disp: 60 tablet, Rfl: 3   metoCLOPramide (REGLAN) 10 MG tablet, Take 1 tablet (10 mg total) by mouth every 6 (six) hours. (Patient not taking: Reported on 05/21/2022), Disp: 30 tablet, Rfl: 0   promethazine (PHENERGAN) 25 MG tablet, Take 1 tablet (25 mg total) by mouth every 6 (six) hours as needed for nausea or vomiting., Disp: 30 tablet, Rfl: 1  No Known Allergies  Review of Systems: Negative except for what is mentioned in HPI.  Objective:   Vitals:   06/01/22 0826 06/01/22 0831  BP: (!) 147/90 126/80  Pulse: 97 97  Weight: 289 lb (131.1 kg)     Fetal Status:           Physical Exam: BP 126/80   Pulse 97   Wt 289 lb (131.1 kg)   LMP  03/18/2022 (Exact Date)   BMI 51.19 kg/m  Bedside and formal ultrasound showed no fetal movement and no fetal heartbeat.  Assessment and Plan:  Pregnancy: [id number] with missed abortion  Missed abortion confirmed with ultrasound. Pt given options for management including expectant management, medical management with cytotec, surgical management.  Pt advised due to her chronic anticoagulation a scheduled surgical management may be the best option so her anticoagulation can be discontinued.    Pt has decided on suction D and E.    Warden Fillers 06/03/2022 9:32 PM

## 2022-06-04 ENCOUNTER — Encounter (HOSPITAL_COMMUNITY)
Admission: RE | Disposition: A | Discharge status: Home / Self Care | Payer: Self-pay | Source: Home / Self Care | Attending: Obstetrics and Gynecology

## 2022-06-04 ENCOUNTER — Ambulatory Visit (HOSPITAL_COMMUNITY): Payer: BC Managed Care – PPO | Admitting: Physician Assistant

## 2022-06-04 ENCOUNTER — Other Ambulatory Visit: Payer: Self-pay

## 2022-06-04 ENCOUNTER — Encounter (HOSPITAL_COMMUNITY): Payer: Self-pay | Admitting: Obstetrics and Gynecology

## 2022-06-04 ENCOUNTER — Ambulatory Visit (HOSPITAL_COMMUNITY)
Admission: RE | Admit: 2022-06-04 | Discharge: 2022-06-04 | Disposition: A | Discharge status: Home / Self Care | Payer: BC Managed Care – PPO | Attending: Obstetrics and Gynecology | Admitting: Obstetrics and Gynecology

## 2022-06-04 DIAGNOSIS — O10911 Unspecified pre-existing hypertension complicating pregnancy, first trimester: Secondary | ICD-10-CM | POA: Insufficient documentation

## 2022-06-04 DIAGNOSIS — O26611 Liver and biliary tract disorders in pregnancy, first trimester: Secondary | ICD-10-CM | POA: Diagnosis not present

## 2022-06-04 DIAGNOSIS — Z794 Long term (current) use of insulin: Secondary | ICD-10-CM | POA: Diagnosis not present

## 2022-06-04 DIAGNOSIS — O99611 Diseases of the digestive system complicating pregnancy, first trimester: Secondary | ICD-10-CM | POA: Insufficient documentation

## 2022-06-04 DIAGNOSIS — Z86711 Personal history of pulmonary embolism: Secondary | ICD-10-CM | POA: Diagnosis not present

## 2022-06-04 DIAGNOSIS — O24111 Pre-existing diabetes mellitus, type 2, in pregnancy, first trimester: Secondary | ICD-10-CM | POA: Insufficient documentation

## 2022-06-04 DIAGNOSIS — K219 Gastro-esophageal reflux disease without esophagitis: Secondary | ICD-10-CM | POA: Insufficient documentation

## 2022-06-04 DIAGNOSIS — K760 Fatty (change of) liver, not elsewhere classified: Secondary | ICD-10-CM | POA: Insufficient documentation

## 2022-06-04 DIAGNOSIS — O021 Missed abortion: Secondary | ICD-10-CM | POA: Insufficient documentation

## 2022-06-04 DIAGNOSIS — Z09 Encounter for follow-up examination after completed treatment for conditions other than malignant neoplasm: Secondary | ICD-10-CM | POA: Diagnosis not present

## 2022-06-04 DIAGNOSIS — Z01812 Encounter for preprocedural laboratory examination: Secondary | ICD-10-CM

## 2022-06-04 DIAGNOSIS — Z79899 Other long term (current) drug therapy: Secondary | ICD-10-CM | POA: Insufficient documentation

## 2022-06-04 DIAGNOSIS — Z86718 Personal history of other venous thrombosis and embolism: Secondary | ICD-10-CM | POA: Diagnosis not present

## 2022-06-04 DIAGNOSIS — Z3A11 11 weeks gestation of pregnancy: Secondary | ICD-10-CM | POA: Insufficient documentation

## 2022-06-04 HISTORY — PX: DILATION AND EVACUATION: SHX1459

## 2022-06-04 HISTORY — DX: Unspecified osteoarthritis, unspecified site: M19.90

## 2022-06-04 HISTORY — DX: Essential (primary) hypertension: I10

## 2022-06-04 LAB — CBC
HCT: 27.7 % — ABNORMAL LOW (ref 36.0–46.0)
Hemoglobin: 7.9 g/dL — ABNORMAL LOW (ref 12.0–15.0)
MCH: 16.8 pg — ABNORMAL LOW (ref 26.0–34.0)
MCHC: 28.5 g/dL — ABNORMAL LOW (ref 30.0–36.0)
MCV: 58.9 fL — ABNORMAL LOW (ref 80.0–100.0)
Platelets: 504 10*3/uL — ABNORMAL HIGH (ref 150–400)
RBC: 4.7 MIL/uL (ref 3.87–5.11)
RDW: 23.4 % — ABNORMAL HIGH (ref 11.5–15.5)
WBC: 12.4 10*3/uL — ABNORMAL HIGH (ref 4.0–10.5)
nRBC: 0 % (ref 0.0–0.2)

## 2022-06-04 LAB — BASIC METABOLIC PANEL
Anion gap: 13 (ref 5–15)
BUN: 8 mg/dL (ref 6–20)
CO2: 20 mmol/L — ABNORMAL LOW (ref 22–32)
Calcium: 9.2 mg/dL (ref 8.9–10.3)
Chloride: 101 mmol/L (ref 98–111)
Creatinine, Ser: 0.61 mg/dL (ref 0.44–1.00)
GFR, Estimated: >60 mL/min (ref >60)
Glucose, Bld: 113 mg/dL — ABNORMAL HIGH (ref 70–99)
Potassium: 4 mmol/L (ref 3.5–5.1)
Sodium: 134 mmol/L — ABNORMAL LOW (ref 135–145)

## 2022-06-04 LAB — TYPE AND SCREEN
ABO/RH(D): A POS
Antibody Screen: NEGATIVE

## 2022-06-04 LAB — GLUCOSE, CAPILLARY: Glucose-Capillary: 121 mg/dL — ABNORMAL HIGH (ref 70–99)

## 2022-06-04 SURGERY — DILATION AND EVACUATION, UTERUS
Anesthesia: General | Site: Vagina

## 2022-06-04 MED ORDER — IBUPROFEN 600 MG PO TABS: 600.0000 mg | ORAL_TABLET | Freq: Four times a day (QID) | ORAL | 3 refills | Status: DC | PRN

## 2022-06-04 MED ORDER — LIDOCAINE 2% (20 MG/ML) 5 ML SYRINGE
INTRAMUSCULAR | Status: DC | PRN
  Administered 2022-06-04: 60 mg via INTRAVENOUS

## 2022-06-04 MED ORDER — DEXAMETHASONE SODIUM PHOSPHATE 10 MG/ML IJ SOLN
INTRAMUSCULAR | Status: AC
  Filled 2022-06-04: qty 1

## 2022-06-04 MED ORDER — OXYCODONE HCL 5 MG/5ML PO SOLN: 5.0000 mg | Freq: Once | ORAL | Status: DC | PRN

## 2022-06-04 MED ORDER — KETOROLAC TROMETHAMINE 30 MG/ML IJ SOLN
INTRAMUSCULAR | Status: DC | PRN
  Administered 2022-06-04: 30 mg via INTRAVENOUS

## 2022-06-04 MED ORDER — FENTANYL CITRATE (PF) 250 MCG/5ML IJ SOLN
INTRAMUSCULAR | Status: AC
  Filled 2022-06-04: qty 5

## 2022-06-04 MED ORDER — PROPOFOL 10 MG/ML IV BOLUS
INTRAVENOUS | Status: AC
  Filled 2022-06-04: qty 20

## 2022-06-04 MED ORDER — DEXAMETHASONE SODIUM PHOSPHATE 10 MG/ML IJ SOLN
INTRAMUSCULAR | Status: DC | PRN
  Administered 2022-06-04: 5 mg via INTRAVENOUS

## 2022-06-04 MED ORDER — CHLOROPROCAINE HCL 1 % IJ SOLN
INTRAMUSCULAR | Status: AC
  Filled 2022-06-04: qty 30

## 2022-06-04 MED ORDER — PROPOFOL 10 MG/ML IV BOLUS
INTRAVENOUS | Status: DC | PRN
  Administered 2022-06-04: 200 mg via INTRAVENOUS

## 2022-06-04 MED ORDER — MIDAZOLAM HCL 2 MG/2ML IJ SOLN
INTRAMUSCULAR | Status: AC
  Filled 2022-06-04: qty 2

## 2022-06-04 MED ORDER — POVIDONE-IODINE 10 % EX SWAB: 2.0000 | Freq: Once | CUTANEOUS | Status: DC

## 2022-06-04 MED ORDER — CHLOROPROCAINE HCL 1 % IJ SOLN
INTRAMUSCULAR | Status: DC | PRN
  Administered 2022-06-04: 10 mL

## 2022-06-04 MED ORDER — HYDROMORPHONE HCL 1 MG/ML IJ SOLN: 0.2500 mg | INTRAMUSCULAR | Status: DC | PRN

## 2022-06-04 MED ORDER — FENTANYL CITRATE (PF) 250 MCG/5ML IJ SOLN
INTRAMUSCULAR | Status: DC | PRN
  Administered 2022-06-04 (×2): 50 ug via INTRAVENOUS

## 2022-06-04 MED ORDER — MEPERIDINE HCL 25 MG/ML IJ SOLN: 6.2500 mg | INTRAMUSCULAR | Status: DC | PRN

## 2022-06-04 MED ORDER — CHLORHEXIDINE GLUCONATE 0.12 % MT SOLN
15.0000 mL | Freq: Once | OROMUCOSAL | Status: AC
  Administered 2022-06-04: 15 mL via OROMUCOSAL
  Filled 2022-06-04: qty 15

## 2022-06-04 MED ORDER — RIVAROXABAN 20 MG PO TABS: 20.0000 mg | ORAL_TABLET | Freq: Every day | ORAL | 2 refills | Status: DC

## 2022-06-04 MED ORDER — 0.9 % SODIUM CHLORIDE (POUR BTL) OPTIME
TOPICAL | Status: DC | PRN
  Administered 2022-06-04: 1000 mL

## 2022-06-04 MED ORDER — DOXYCYCLINE HYCLATE 100 MG IV SOLR
200.0000 mg | INTRAVENOUS | Status: AC
  Administered 2022-06-04: 200 mg via INTRAVENOUS
  Filled 2022-06-04: qty 200

## 2022-06-04 MED ORDER — ORAL CARE MOUTH RINSE: 15.0000 mL | Freq: Once | OROMUCOSAL | Status: AC

## 2022-06-04 MED ORDER — ONDANSETRON HCL 4 MG/2ML IJ SOLN
INTRAMUSCULAR | Status: AC
  Filled 2022-06-04: qty 2

## 2022-06-04 MED ORDER — ONDANSETRON HCL 4 MG/2ML IJ SOLN
INTRAMUSCULAR | Status: DC | PRN
  Administered 2022-06-04: 4 mg via INTRAVENOUS

## 2022-06-04 MED ORDER — MIDAZOLAM HCL 2 MG/2ML IJ SOLN
INTRAMUSCULAR | Status: DC | PRN
  Administered 2022-06-04: 2 mg via INTRAVENOUS

## 2022-06-04 MED ORDER — PROMETHAZINE HCL 25 MG/ML IJ SOLN: 6.2500 mg | INTRAMUSCULAR | Status: DC | PRN

## 2022-06-04 MED ORDER — AMISULPRIDE (ANTIEMETIC) 5 MG/2ML IV SOLN: 10.0000 mg | Freq: Once | INTRAVENOUS | Status: DC | PRN

## 2022-06-04 MED ORDER — OXYCODONE HCL 5 MG PO TABS: 5.0000 mg | ORAL_TABLET | Freq: Once | ORAL | Status: DC | PRN

## 2022-06-04 MED ORDER — LACTATED RINGERS IV SOLN: INTRAVENOUS | Status: DC

## 2022-06-04 SURGICAL SUPPLY — 22 items
CATH ROBINSON RED A/P 16FR (CATHETERS) ×1 IMPLANT
FILTER UTR ASPR ASSEMBLY (MISCELLANEOUS) ×1 IMPLANT
GLOVE BIOGEL PI IND STRL 6.5 (GLOVE) ×1 IMPLANT
GLOVE BIOGEL PI IND STRL 7.0 (GLOVE) ×1 IMPLANT
GLOVE SURG SS PI 6.5 STRL IVOR (GLOVE) ×1 IMPLANT
GOWN STRL REUS W/ TWL LRG LVL3 (GOWN DISPOSABLE) ×2 IMPLANT
GOWN STRL REUS W/TWL LRG LVL3 (GOWN DISPOSABLE) ×2
HOSE CONNECTING 18IN BERKELEY (TUBING) ×1 IMPLANT
KIT BERKELEY 1ST TRI 3/8 NO TR (MISCELLANEOUS) ×1 IMPLANT
KIT BERKELEY 1ST TRIMESTER 3/8 (MISCELLANEOUS) ×1 IMPLANT
NS IRRIG 1000ML POUR BTL (IV SOLUTION) ×1 IMPLANT
PACK VAGINAL MINOR WOMEN LF (CUSTOM PROCEDURE TRAY) ×1 IMPLANT
PAD OB MATERNITY 4.3X12.25 (PERSONAL CARE ITEMS) ×1 IMPLANT
SET BERKELEY SUCTION TUBING (SUCTIONS) ×1 IMPLANT
SPIKE FLUID TRANSFER (MISCELLANEOUS) ×1 IMPLANT
TOWEL GREEN STERILE FF (TOWEL DISPOSABLE) ×2 IMPLANT
UNDERPAD 30X36 HEAVY ABSORB (UNDERPADS AND DIAPERS) ×1 IMPLANT
VACURETTE 10 RIGID CVD (CANNULA) IMPLANT
VACURETTE 6 ASPIR F TIP BERK (CANNULA) IMPLANT
VACURETTE 7MM CVD STRL WRAP (CANNULA) IMPLANT
VACURETTE 8 RIGID CVD (CANNULA) IMPLANT
VACURETTE 9 RIGID CVD (CANNULA) IMPLANT

## 2022-06-04 NOTE — Anesthesia Procedure Notes (Signed)
Procedure Name: LMA Insertion Date/Time: 06/04/2022 2:55 PM  Performed by: Orlin Hilding, CRNAPre-anesthesia Checklist: Patient identified, Emergency Drugs available, Suction available, Timeout performed and Patient being monitored Patient Re-evaluated:Patient Re-evaluated prior to induction Oxygen Delivery Method: Circle system utilized Preoxygenation: Pre-oxygenation with 100% oxygen Induction Type: IV induction LMA: LMA inserted LMA Size: 4.0 Number of attempts: 1 Placement Confirmation: positive ETCO2 and breath sounds checked- equal and bilateral Tube secured with: Tape

## 2022-06-04 NOTE — Progress Notes (Signed)
Notified Dr. Hyacinth Meeker that pt had not taken her Labetolol since 05/31/22 and that her BP is 141/91 and pulse 93. No orders at this time. Also notified him that pt has small piercing in right nostril. She says it will not come out because of a keloid. OK to leave piercing in if it will not come out per Dr. Hyacinth Meeker.

## 2022-06-04 NOTE — Anesthesia Postprocedure Evaluation (Signed)
Anesthesia Post Note  Patient: Virginia Fitzgerald  Procedure(s) Performed: DILATATION AND EVACUATION (Vagina )     Patient location during evaluation: PACU Anesthesia Type: General Level of consciousness: awake and alert Pain management: pain level controlled Vital Signs Assessment: post-procedure vital signs reviewed and stable Respiratory status: spontaneous breathing, nonlabored ventilation and respiratory function stable Cardiovascular status: blood pressure returned to baseline and stable Postop Assessment: no apparent nausea or vomiting Anesthetic complications: no   No notable events documented.  Last Vitals:  Vitals:   06/04/22 1600 06/04/22 1615  BP: 139/89 138/88  Pulse: 88 86  Resp: 14 15  Temp: 36.9 C 36.9 C  SpO2: 100% 98%    Last Pain:  Vitals:   06/04/22 1615  TempSrc:   PainSc: 0-No pain   Pain Goal:                   [name]

## 2022-06-04 NOTE — Anesthesia Preprocedure Evaluation (Signed)
Anesthesia Evaluation  Patient identified by MRN, date of birth, ID band Patient awake    Reviewed: Allergy & Precautions, Patient's Chart, lab work & pertinent test results, reviewed documented beta blocker date and time   Airway Mallampati: III  TM Distance: >3 FB Neck ROM: Full    Dental no notable dental hx.    Pulmonary PE (hx PE on lovenox 0.5mg /kg BID- last dose >24h ago)   Pulmonary exam normal breath sounds clear to auscultation       Cardiovascular hypertension, Pt. on medications and Pt. on home beta blockers + DVT  Normal cardiovascular exam Rhythm:Regular Rate:Normal     Neuro/Psych  Headaches  negative psych ROS   GI/Hepatic Neg liver ROS, hiatal hernia,GERD  ,,  Endo/Other  diabetes, Well Controlled, Type 2, Insulin Dependent  Morbid obesityBMI 53 Last a1c 09/2020 7.5  Renal/GU negative Renal ROS  negative genitourinary   Musculoskeletal  (+) Arthritis , Osteoarthritis,    Abdominal  (+) + obese  Peds negative pediatric ROS (+)  Hematology  (+) Blood dyscrasia, anemia hct 29.6, plt 427 INR 1.0   Anesthesia Other Findings   Reproductive/Obstetrics IUFD 25 5/7wks G5P3                             Anesthesia Physical Anesthesia Plan  ASA: 3  Anesthesia Plan: General   Post-op Pain Management: Toradol IV (intra-op)*   Induction: Intravenous  PONV Risk Score and Plan: 3 and Ondansetron, Dexamethasone, Midazolam and Treatment may vary due to age or medical condition  Airway Management Planned: LMA  Additional Equipment: None  Intra-op Plan:   Post-operative Plan:   Informed Consent: I have reviewed the patients History and Physical, chart, labs and discussed the procedure including the risks, benefits and alternatives for the proposed anesthesia with the patient or authorized representative who has indicated his/her understanding and acceptance.       Plan  Discussed with:   Anesthesia Plan Comments:         Anesthesia Quick Evaluation

## 2022-06-04 NOTE — Op Note (Signed)
Virginia Fitzgerald PROCEDURE DATE: 06/04/2022  PREOPERATIVE DIAGNOSIS: 11 week missed abortion. POSTOPERATIVE DIAGNOSIS: The same. PROCEDURE:     Dilation and Evacuation. SURGEON:  Dr. Catalina Antigua  INDICATIONS: 37 y.o. Z6X0960 with MAB at [redacted] weeks gestation, needing surgical completion.  Risks of surgery were discussed with the patient including but not limited to: bleeding which may require transfusion; infection which may require antibiotics; injury to uterus or surrounding organs;need for additional procedures including laparotomy or laparoscopy; possibility of intrauterine scarring which may impair future fertility; and other postoperative/anesthesia complications. Written informed consent was obtained.    FINDINGS:  A 12-week size anteverted uterus, moderate amounts of products of conception, specimen sent to pathology.  ANESTHESIA:    Monitored intravenous sedation, paracervical block. INTRAVENOUS FLUIDS:  700 ml of LR ESTIMATED BLOOD LOSS:  Less than 20 ml. SPECIMENS:  Products of conception sent to pathology COMPLICATIONS:  None immediate.  PROCEDURE DETAILS:  The patient received intravenous antibiotics while in the preoperative area.  She was then taken to the operating room where general anesthesia was administered and was found to be adequate.  After an adequate timeout was performed, she was placed in the dorsal lithotomy position and examined; then prepped and draped in the sterile manner.   Her bladder was catheterized for an unmeasured amount of clear, yellow urine. A vaginal speculum was then placed in the patient's vagina and a single tooth tenaculum was applied to the anterior lip of the cervix.  A paracervical block using 0.5% Marcaine was administered. The cervix was gently dilated to accommodate a 10 mm suction curette that was gently advanced to the uterine fundus.  The suction device was then activated and curette slowly rotated to clear the uterus of products of conception.   A sharp curettage was then performed to confirm complete emptying of the uterus. There was minimal bleeding noted and the tenaculum removed with good hemostasis noted.   All instruments were removed from the patient's vagina. The patient tolerated the procedure well and was taken to the recovery area awake, and in stable condition.  The patient will be discharged to home as per PACU criteria.  Routine postoperative instructions given.  She will follow up in the clinic in 2 weeks for postoperative evaluation.

## 2022-06-04 NOTE — H&P (Signed)
Virginia Fitzgerald is an 37 y.o. female here for scheduled dilatation and evacuation for the treatment of a missed abortion. Patient was seen for routine prenatal care when fetal heart tone were not heard or visualized. She has a medical history significant for uncontrolled type 2 diabetes, hx of DVT/PE , chronic anticoagulation, HTN and morbid obesity. Patient reports feeling well and coping emotionally.   Menstrual History: Patient's last menstrual period was 03/18/2022 (exact date).    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   A1C 6.5    Fatty liver    GDM (gestational diabetes mellitus)    WITH 2019 PREGNANCY ONLY   GERD (gastroesophageal reflux disease)    diet controlled - no meds   Headache    last one 05/25/17 - otc med   History of blood transfusion 2012   At [facility]   History of DVT of lower extremity    History of hiatal hernia    Hypertension    Iron deficiency anemia 11/14/2011   Metallic taste 01/14/2020   MRSA infection    not in EPIC - per patient in 2011   Pulmonary embolism (HCC)    S/P IVC filter    Thrombocytosis     Past Surgical History:  Procedure Laterality Date   DILATION AND EVACUATION N/A 05/29/2017   Procedure: DILATATION AND EVACUATION;  Surgeon: Willodean Rosenthal, MD;  [facility]: [facility];  Service: Gynecology;  Laterality: N/A;   DILATION AND EVACUATION N/A 02/03/2021   Procedure: DILATATION AND EVACUATION;  Surgeon: Adam Phenix, MD;  [facility];  Service: Gynecology;  Laterality: N/A;   WISDOM TOOTH EXTRACTION      Family History  Problem Relation Age of Onset   Diabetes Mother    Hypertension Mother    Hypertension Maternal Uncle    Diabetes Maternal Uncle    Hypertension Maternal Grandmother     Social History:  reports that she has never smoked. She has never used smokeless tobacco. She reports  that she does not drink alcohol and does not use drugs.  Allergies: No Known Allergies  Medications Prior to Admission  Medication Sig Dispense Refill Last Dose   acetaminophen (TYLENOL) 325 MG tablet Take 2 tablets (650 mg total) by mouth every 4 (four) hours as needed (for pain scale < 4). 60 tablet 1    bacitracin ointment Apply 1 Application topically 2 (two) times daily. 120 g 0    cephALEXin (KEFLEX) 500 MG capsule Take 1 capsule (500 mg total) by mouth 4 (four) times daily. 20 capsule 0    cetirizine (ZYRTEC ALLERGY) 10 MG tablet Take 1 tablet (10 mg total) by mouth daily. (Patient not taking: Reported on 11/15/2020) 14 tablet 0    enoxaparin (LOVENOX) 60 MG/0.6ML injection Inject 0.6 mLs (60 mg total) into the skin every 12 (twelve) hours. 60 mL 11    famotidine (PEPCID) 20 MG tablet Take 1 tablet (20 mg total) by mouth 2 (two) times daily. 30 tablet 0    ferrous gluconate (FERGON) 324 MG tablet Take 1 tablet (324 mg total) by mouth every other day. 60 tablet 1    insulin aspart (NOVOLOG FLEXPEN) 100 UNIT/ML FlexPen Inject 0-9 Units into the skin 3 (three) times daily with meals. (Patient not taking: Reported on 04/20/2022) 15 mL 1    insulin detemir (LEVEMIR) 100  UNIT/ML FlexPen Inject 20 Units into the skin daily. (Patient not taking: Reported on 04/20/2022) 15 mL 1    Insulin Pen Needle (NOVOFINE) 30G X 8 MM MISC Inject 10 each into the skin as needed. (Patient not taking: Reported on 04/20/2022) 3 packet 2    labetalol (NORMODYNE) 100 MG tablet Take 2 tablets (200 mg total) by mouth 2 (two) times daily. (Patient taking differently: Take 200 mg by mouth 2 (two) times daily. Taking differently) 60 tablet 3    metoCLOPramide (REGLAN) 10 MG tablet Take 1 tablet (10 mg total) by mouth every 6 (six) hours. (Patient not taking: Reported on 05/21/2022) 30 tablet 0    promethazine (PHENERGAN) 25 MG tablet Take 1 tablet (25 mg total) by mouth every 6 (six) hours as needed for nausea or vomiting. 30  tablet 1     Review of Systems See pertinent in HPI. All other systems reviewed and non contributory Blood pressure (!) 141/91, pulse 93, temperature 98 F (36.7 C), temperature source Oral, resp. rate 20, height 5\' 2"  (1.575 m), weight 131.1 kg, last menstrual period 03/18/2022, SpO2 98 %, unknown if currently breastfeeding. Physical Exam GENERAL: Well-developed, well-nourished female in no acute distress.  LUNGS: Clear to auscultation bilaterally.  HEART: Regular rate and rhythm. ABDOMEN: Soft, nontender, nondistended. No organomegaly. PELVIC: Deferred to OR EXTREMITIES: No cyanosis, clubbing, or edema, 2+ distal pulses.  No results found for this or any previous visit (from the past 24 hour(s)).  No results found.  Assessment/Plan: 37 yo with missed abortion at 11 weeks here for schedule dilatation and evacuation - Risks, benefits and alternatives were explained including but limited to risks of bleeding, infection, uterine perforation and damage to adjacent organs. Patient verbalized understanding and all questions were answered  Virginia Fitzgerald 06/04/2022, 1:25 PM

## 2022-06-04 NOTE — Transfer of Care (Signed)
Immediate Anesthesia Transfer of Care Note  Patient: Virginia Fitzgerald  Procedure(s) Performed: DILATATION AND EVACUATION (Vagina )  Patient Location: PACU  Anesthesia Type:General  Level of Consciousness: awake, oriented, and patient cooperative  Airway & Oxygen Therapy: Patient Spontanous Breathing  Post-op Assessment: Report given to RN and Post -op Vital signs reviewed and stable  Post vital signs: Reviewed and stable  Last Vitals:  Vitals Value Taken Time  BP 139/86 06/04/22 1533  Temp 36.9 C 06/04/22 1533  Pulse 99 06/04/22 1535  Resp 18 06/04/22 1535  SpO2 96 % 06/04/22 1535  Vitals shown include unvalidated device data.  Last Pain:  Vitals:   06/04/22 1533  TempSrc:   PainSc: 0-No pain         Complications: No notable events documented.

## 2022-06-05 ENCOUNTER — Encounter (HOSPITAL_COMMUNITY): Payer: Self-pay | Admitting: Obstetrics and Gynecology

## 2022-06-05 NOTE — BH Specialist Note (Signed)
Integrated Behavioral Health Initial In-Person Visit  MRN: [id number] Name: Virginia Fitzgerald  Number of Integrated Behavioral Health Clinician visits: 1 Session Start time:   10:00am Session End time: 10:30pm Total time in minutes: 30 mins   Types of Service: General Behavioral Integrated Care (BHI)  Interpretor:No. Interpretor Name and Language: none   Warm Hand Off Completed.        Subjective: Virginia Fitzgerald is a 37 y.o. female accompanied by n/a Patient was referred by Dr Donavan Foil for miscarriage. Patient reports the following symptoms/concerns: depressed mood and shock upon miscarriage  Duration of problem: ; Severity of problem: mild  Objective: Mood: disbelief and Affect: Appropriate Risk of harm to self or others: No plan to harm self or others  Life Context: Family and Social: lives with spouse and two children  School/Work: full time  Self-Care: n/a Life Changes: 2nd pregnancy loss   Patient and/or Family's Strengths/Protective Factors: Concrete supports in place (healthy food, safe environments, etc.)  Goals Addressed: Patient will: Reduce symptoms of: depression Increase knowledge and/or ability of: coping skills  Demonstrate ability to: Increase healthy adjustment to current life circumstances  Progress towards Goals: Ongoing  Interventions: Interventions utilized: Supportive Counseling  Standardized Assessments completed: Not Needed  Patient and/or Family Response: Virginia Fitzgerald was shocked and sadden upon receiving information of her miscarriage. Virginia Fitzgerald reports she works a lot and does not have spend enough time on taking care of herself.    Assessment: Patient currently experiencing grief.   Patient may benefit from integrated behavioral health.  Plan: Follow up with behavioral health clinician on : 06/15/2022 Behavioral recommendations: Process miscarriage with partner and excerise patience with grieving process.  Referral(s):  Integrated [employer] (In Clinic) "From scale of 1-10, how likely are you to follow plan?":    Gwyndolyn Saxon, LCSW

## 2022-06-06 LAB — SURGICAL PATHOLOGY

## 2022-06-13 LAB — ANORA MISCARRIAGE TEST - FRESH

## 2022-06-15 ENCOUNTER — Encounter: Payer: Self-pay | Admitting: Obstetrics and Gynecology

## 2022-06-15 ENCOUNTER — Ambulatory Visit (INDEPENDENT_AMBULATORY_CARE_PROVIDER_SITE_OTHER): Payer: BC Managed Care – PPO | Admitting: Obstetrics and Gynecology

## 2022-06-15 VITALS — BP 136/87 | HR 102 | Ht 63.0 in | Wt 295.0 lb

## 2022-06-15 DIAGNOSIS — Z9889 Other specified postprocedural states: Secondary | ICD-10-CM

## 2022-06-15 DIAGNOSIS — E119 Type 2 diabetes mellitus without complications: Secondary | ICD-10-CM

## 2022-06-15 NOTE — Progress Notes (Signed)
37 yo here for post op check following D&E on 06/04/22. Patient reports onset of light bleeding this past week accompanied by mild cramping. Patient is without any other complaints. She would like to conceive in the near future  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   A1C 6.5    Fatty liver    GDM (gestational diabetes mellitus)    WITH 2019 PREGNANCY ONLY   GERD (gastroesophageal reflux disease)    diet controlled - no meds   Headache    last one 05/25/17 - otc med   History of blood transfusion 2012   At [facility]   History of DVT of lower extremity    History of hiatal hernia    Hypertension    Iron deficiency anemia 11/14/2011   Metallic taste 01/14/2020   MRSA infection    not in EPIC - per patient in 2011   Pulmonary embolism (HCC)    S/P IVC filter    Thrombocytosis    Past Surgical History:  Procedure Laterality Date   DILATION AND EVACUATION N/A 05/29/2017   Procedure: DILATATION AND EVACUATION;  Surgeon: Willodean Rosenthal, MD;  Location: [facility];  Service: Gynecology;  Laterality: N/A;   DILATION AND EVACUATION N/A 02/03/2021   Procedure: DILATATION AND EVACUATION;  Surgeon: Adam Phenix, MD;  [facility];  Service: Gynecology;  Laterality: N/A;   DILATION AND EVACUATION N/A 06/04/2022   Procedure: DILATATION AND EVACUATION;  Surgeon: Catalina Antigua, MD;  Location: [facility];  Service: Gynecology;  Laterality: N/A;   WISDOM TOOTH EXTRACTION     Family History  Problem Relation Age of Onset   Diabetes Mother    Hypertension Mother    Hypertension Maternal Uncle    Diabetes Maternal Uncle    Hypertension Maternal Grandmother    Social History   Tobacco Use   Smoking status: Never   Smokeless tobacco: Never   Tobacco comments:    never used tobacco  Vaping Use   Vaping Use: Never used  Substance Use Topics   Alcohol use: No     Alcohol/week: 0.0 standard drinks of alcohol   Drug use: No   ROS See pertinent in HPI. All other systems reviewed and non contributory Blood pressure 136/87, pulse (!) 102, height 5\' 3"  (1.6 m), weight 295 lb (133.8 kg), last menstrual period 03/18/2022, not currently breastfeeding. GENERAL: Well-developed, well-nourished female in no acute distress.  ABDOMEN: Soft, nontender, nondistended. No organomegaly. PELVIC: Normal external female genitalia. Vagina is pink and rugated.  Normal discharge. Normal appearing cervix with slow ooze of dark blood. Uterus is normal in size. No adnexal mass or tenderness. Chaperone present during the pelvic exam EXTREMITIES: No cyanosis, clubbing, or edema, 2+ distal pulses.    A/P 37 yo here for post op check following D&E - Patient is medically cleared to resume all activities - Reviewed normal female results for anora - Discussed optimization of diabetes prior to conception. Patient referred to nutritionist - Birth control options reviewed with the patient who is considering Mirena IUD - Patient to return when ready for contraception

## 2022-06-15 NOTE — Progress Notes (Signed)
37 y.o. GYN presents for Post Op FU.  C/o cramping and bleeding.

## 2022-06-27 ENCOUNTER — Ambulatory Visit: Payer: BC Managed Care – PPO | Admitting: Skilled Nursing Facility1

## 2022-07-03 ENCOUNTER — Encounter: Payer: BC Managed Care – PPO | Admitting: Obstetrics and Gynecology

## 2022-07-20 ENCOUNTER — Ambulatory Visit: Payer: BC Managed Care – PPO | Admitting: Obstetrics and Gynecology

## 2022-07-30 ENCOUNTER — Ambulatory Visit: Payer: BC Managed Care – PPO

## 2022-08-13 ENCOUNTER — Ambulatory Visit (HOSPITAL_COMMUNITY)
Admission: EM | Admit: 2022-08-13 | Discharge: 2022-08-13 | Disposition: A | Discharge status: Home / Self Care | Payer: BC Managed Care – PPO | Attending: Internal Medicine | Admitting: Internal Medicine

## 2022-08-13 ENCOUNTER — Encounter (HOSPITAL_COMMUNITY): Payer: Self-pay | Admitting: Emergency Medicine

## 2022-08-13 DIAGNOSIS — O98511 Other viral diseases complicating pregnancy, first trimester: Secondary | ICD-10-CM | POA: Insufficient documentation

## 2022-08-13 DIAGNOSIS — Z1152 Encounter for screening for COVID-19: Secondary | ICD-10-CM | POA: Insufficient documentation

## 2022-08-13 DIAGNOSIS — B9789 Other viral agents as the cause of diseases classified elsewhere: Secondary | ICD-10-CM | POA: Diagnosis not present

## 2022-08-13 DIAGNOSIS — J069 Acute upper respiratory infection, unspecified: Secondary | ICD-10-CM | POA: Insufficient documentation

## 2022-08-13 DIAGNOSIS — Z3201 Encounter for pregnancy test, result positive: Secondary | ICD-10-CM | POA: Diagnosis not present

## 2022-08-13 LAB — POCT URINE PREGNANCY: Preg Test, Ur: POSITIVE — AB

## 2022-08-13 NOTE — ED Provider Notes (Signed)
[facility]    CSN: [id number] Arrival date & time: 08/13/22  1939      History   Chief Complaint Chief Complaint  Patient presents with   Cough   Sore Throat   Possible Pregnancy    HPI Virginia Fitzgerald is a 37 y.o. female.   Patient presents with 3 days of cough, congestion, sore throat.  She has tried nothing for her symptoms.  She denies fever, chills, nausea, vomiting, body aches.  Requesting a COVID test.  She also requests pregnancy test in clinic today.  She tested positive at home on Thursday.  Patient had a miscarriage in May.  She has not had a regular period since May.  Patient had D&C following miscarriage in May.    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   History of DVT of lower extremity    History of hiatal hernia    Hypertension    Iron deficiency anemia 11/14/2011   Metallic taste 01/14/2020   MRSA infection    not in EPIC - per patient in 2011   Pulmonary embolism (HCC)    S/P IVC filter    Thrombocytosis     Patient Active Problem List   Diagnosis Date Noted   History of IUFD 06/01/2022   Supervision of high risk pregnancy, antepartum 05/21/2022   IUFD at 20 weeks or more of gestation 02/02/2021   Chronic hypertension in pregnancy 01/14/2021   Insulin-requiring or dependent type II diabetes mellitus (HCC) 01/14/2021   Chronic pulmonary embolism without acute cor pulmonale (HCC) 01/14/2021   Mixed hyperlipidemia 01/14/2021   Health education/counseling 01/14/2021   IUGR (intrauterine growth restriction) affecting care of mother 12/08/2020   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   S/P IVC filter 12/05/2020   Elevated AFP 11/24/2020   Low fetal fraction on Panorama at 10 wks 11/21/2020   Anticoagulation in pregnancy 10/20/2020   Anemia in pregnancy 10/20/2020   Rubella non-immune status, antepartum 10/20/2020   Pre-existing type 2 diabetes mellitus during pregnancy, antepartum 10/19/2020   AMA (advanced maternal age) multigravida 35+, first trimester 10/19/2020   Allergies 01/14/2020   Abnormal chest x-ray 12/28/2019   History of COVID-19 11/25/2019   Type 2 diabetes mellitus without complication, without long-term current use of insulin (HCC) 11/25/2019   History of recurrent deep vein thrombosis (DVT) 11/25/2019   Missed abortion 05/29/2017   Diabetes type 2, 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   Essential hypertension, benign 03/20/2017   Obesity in pregnancy 03/20/2017   Recurrent pulmonary embolism (HCC) 03/20/2017   BMI 50.0-59.9, adult (HCC) 02/28/2017    Past Surgical History:  Procedure Laterality Date   DILATION AND EVACUATION N/A 05/29/2017   Procedure: DILATATION AND EVACUATION;  Surgeon: Willodean Rosenthal, MD;  Location: [facility];  Service: Gynecology;  Laterality: N/A;   DILATION AND EVACUATION N/A 02/03/2021   Procedure: DILATATION AND EVACUATION;  Surgeon: Adam Phenix, MD;  [facility];  Service: Gynecology;  Laterality: N/A;   DILATION  AND EVACUATION N/A 06/04/2022   Procedure: DILATATION AND EVACUATION;  Surgeon: Catalina Antigua, MD;  Location: [facility];  Service: Gynecology;  Laterality: N/A;   WISDOM TOOTH EXTRACTION      OB History     Gravida  6   Para  4   Term  3   Preterm  1   AB  1   Living  3      SAB  1   IAB  0   Ectopic  0   Multiple  0   Live Births  3        Obstetric Comments  Largest prior was 7lbs 7oz. No issues with deliveries.           Home Medications    Prior to Admission medications   Medication Sig Start Date End Date Taking? Authorizing Provider   acetaminophen (TYLENOL) 325 MG tablet Take 2 tablets (650 mg total) by mouth every 4 (four) hours as needed (for pain scale < 4). 02/03/21   Milas Hock, MD  bacitracin ointment Apply 1 Application topically 2 (two) times daily. 05/23/22   Gareth Eagle, PA-C  cetirizine (ZYRTEC ALLERGY) 10 MG tablet Take 1 tablet (10 mg total) by mouth daily. Patient not taking: Reported on 11/15/2020 01/14/20   Defelice, Para March, NP  famotidine (PEPCID) 20 MG tablet Take 1 tablet (20 mg total) by mouth 2 (two) times daily. 05/02/22   Brand Males, CNM  ferrous gluconate (FERGON) 324 MG tablet Take 1 tablet (324 mg total) by mouth every other day. 04/20/22   Gerrit Heck, CNM  ibuprofen (ADVIL) 600 MG tablet Take 1 tablet (600 mg total) by mouth every 6 (six) hours as needed. 06/04/22   Constant, Peggy, MD  insulin aspart (NOVOLOG FLEXPEN) 100 UNIT/ML FlexPen Inject 0-9 Units into the skin 3 (three) times daily with meals. Patient not taking: Reported on 04/20/2022 02/03/21   Milas Hock, MD  insulin detemir (LEVEMIR) 100 UNIT/ML FlexPen Inject 20 Units into the skin daily. Patient not taking: Reported on 04/20/2022 02/03/21   Milas Hock, MD  Insulin Pen Needle (NOVOFINE) 30G X 8 MM MISC Inject 10 each into the skin as needed. Patient not taking: Reported on 04/20/2022 02/03/21   Milas Hock, MD  labetalol (NORMODYNE) 100 MG tablet Take 2 tablets (200 mg total) by mouth 2 (two) times daily. Patient taking differently: Take 200 mg by mouth 2 (two) times daily. Taking differently 05/02/22   Brand Males, CNM  metoCLOPramide (REGLAN) 10 MG tablet Take 1 tablet (10 mg total) by mouth every 6 (six) hours. Patient not taking: Reported on 05/21/2022 05/02/22   Brand Males, CNM  rivaroxaban (XARELTO) 20 MG TABS tablet Take 1 tablet (20 mg total) by mouth daily. 06/04/22   Constant, Peggy, MD  Blood Pressure Monitoring (BLOOD PRESSURE KIT) DEVI 1 Device by Does not apply route as needed. Patient not taking:  No sig reported 10/19/20   [place] Bing, MD    Family History Family History  Problem Relation Age of Onset   Diabetes Mother    Hypertension Mother    Hypertension Maternal Uncle    Diabetes Maternal Uncle    Hypertension Maternal Grandmother     Social History Social History   Tobacco Use   Smoking status: Never   Smokeless tobacco: Never   Tobacco comments:    never used tobacco  Vaping Use   Vaping status: Never Used  Substance Use Topics   Alcohol use: No  Alcohol/week: 0.0 standard drinks of alcohol   Drug use: No     Allergies   Patient has no known allergies.   Review of Systems Review of Systems  Constitutional:  Negative for chills and fever.  HENT:  Positive for congestion. Negative for ear pain and sore throat.   Eyes:  Negative for pain and visual disturbance.  Respiratory:  Positive for cough. Negative for shortness of breath and wheezing.   Cardiovascular:  Negative for chest pain and palpitations.  Gastrointestinal:  Negative for abdominal pain and vomiting.  Genitourinary:  Negative for dysuria and hematuria.  Musculoskeletal:  Negative for arthralgias and back pain.  Skin:  Negative for color change and rash.  Neurological:  Negative for seizures and syncope.  All other systems reviewed and are negative.    Physical Exam Triage Vital Signs ED Triage Vitals  Encounter Vitals Group     BP 08/13/22 2007 (!) 167/105     Systolic BP Percentile --      Diastolic BP Percentile --      Pulse Rate 08/13/22 2007 (!) 110     Resp 08/13/22 2007 20     Temp 08/13/22 2007 98.5 F (36.9 C)     Temp Source 08/13/22 2007 Oral     SpO2 08/13/22 2007 96 %     Weight --      Height --      Head Circumference --      Peak Flow --      Pain Score 08/13/22 2005 8     Pain Loc --      Pain Education --      Exclude from Growth Chart --    No data found.  Updated Vital Signs BP (!) 167/105 (BP Location: Right Arm)   Pulse (!) 110   Temp 98.5  F (36.9 C) (Oral)   Resp 20   LMP  (LMP Unknown)   SpO2 96%   Breastfeeding No   Visual Acuity Right Eye Distance:   Left Eye Distance:   Bilateral Distance:    Right Eye Near:   Left Eye Near:    Bilateral Near:     Physical Exam Vitals and nursing note reviewed.  Constitutional:      General: She is not in acute distress.    Appearance: She is well-developed.  HENT:     Head: Normocephalic and atraumatic.  Eyes:     Conjunctiva/sclera: Conjunctivae normal.  Cardiovascular:     Rate and Rhythm: Normal rate and regular rhythm.     Heart sounds: No murmur heard. Pulmonary:     Effort: Pulmonary effort is normal. No respiratory distress.     Breath sounds: Normal breath sounds.  Abdominal:     Palpations: Abdomen is soft.     Tenderness: There is no abdominal tenderness.  Musculoskeletal:        General: No swelling.     Cervical back: Neck supple.  Skin:    General: Skin is warm and dry.     Capillary Refill: Capillary refill takes less than 2 seconds.  Neurological:     Mental Status: She is alert.  Psychiatric:        Mood and Affect: Mood normal.      UC Treatments / Results  Labs (all labs ordered are listed, but only abnormal results are displayed) Labs Reviewed  POCT URINE PREGNANCY - Abnormal; Notable for the following components:      Result Value   Preg  Test, Ur Positive (*)    All other components within normal limits  SARS CORONAVIRUS 2 (TAT 6-24 HRS)    EKG   Radiology No results found.  Procedures Procedures (including critical care time)  Medications Ordered in UC Medications - No data to display  Initial Impression / Assessment and Plan / UC Course  I have reviewed the triage vital signs and the nursing notes.  Pertinent labs & imaging results that were available during my care of the patient were reviewed by me and considered in my medical decision making (see chart for details).     URI.  Patient overall well-appearing in  no acute distress, lungs clear to auscultation.  Supportive care discussed.  Positive pregnancy test in clinic today.  Patient unsure of LMP.  She has not had a regular cycle since her miscarriage in May.  Advised to follow-up with OB/GYN.  She reports she has an appointment scheduled for 7/23.  Final Clinical Impressions(s) / UC Diagnoses   Final diagnoses:  Viral upper respiratory tract infection  Positive pregnancy test     Discharge Instructions      Recommend Robitussin for cough Can take Tylenol as needed  Keep OBGYN appointment  Can contact [facility] for further options at [phone] [address] [place], [place] [address]   ED Prescriptions   None    PDMP not reviewed this encounter.   Ward, Tylene Fantasia, PA-C 08/13/22 2112

## 2022-08-13 NOTE — ED Triage Notes (Addendum)
Pt had cough and sore throat since last Thursday. Hasn't taken medications for symptoms.   Pt also wanting pregnancy test. Had positive test last Thursday at home. Had a miscarriage in May.Marland Kitchen

## 2022-08-13 NOTE — Discharge Instructions (Addendum)
Recommend Robitussin for cough Can take Tylenol as needed  Keep OBGYN appointment  Can contact [facility] for further options at [phone] [address] [place], [place] [address]

## 2022-08-14 LAB — SARS CORONAVIRUS 2 (TAT 6-24 HRS): SARS Coronavirus 2: NEGATIVE

## 2022-08-21 ENCOUNTER — Ambulatory Visit: Payer: Medicaid Other

## 2022-08-25 ENCOUNTER — Inpatient Hospital Stay (HOSPITAL_COMMUNITY): Payer: BLUE CROSS/BLUE SHIELD

## 2022-08-25 ENCOUNTER — Encounter (HOSPITAL_COMMUNITY): Payer: Self-pay

## 2022-08-25 ENCOUNTER — Inpatient Hospital Stay (HOSPITAL_COMMUNITY)
Admission: AD | Admit: 2022-08-25 | Discharge: 2022-08-25 | Disposition: A | Discharge status: Home / Self Care | Payer: BLUE CROSS/BLUE SHIELD | Attending: Family Medicine | Admitting: Family Medicine

## 2022-08-25 DIAGNOSIS — Z3A00 Weeks of gestation of pregnancy not specified: Secondary | ICD-10-CM | POA: Diagnosis not present

## 2022-08-25 DIAGNOSIS — R102 Pelvic and perineal pain: Secondary | ICD-10-CM | POA: Diagnosis present

## 2022-08-25 DIAGNOSIS — R1030 Lower abdominal pain, unspecified: Secondary | ICD-10-CM | POA: Diagnosis not present

## 2022-08-25 DIAGNOSIS — Z3490 Encounter for supervision of normal pregnancy, unspecified, unspecified trimester: Secondary | ICD-10-CM

## 2022-08-25 DIAGNOSIS — O0990 Supervision of high risk pregnancy, unspecified, unspecified trimester: Secondary | ICD-10-CM

## 2022-08-25 LAB — URINALYSIS, ROUTINE W REFLEX MICROSCOPIC
Bilirubin Urine: NEGATIVE
Glucose, UA: NEGATIVE mg/dL
Hgb urine dipstick: NEGATIVE
Ketones, ur: NEGATIVE mg/dL
Nitrite: NEGATIVE
Protein, ur: NEGATIVE mg/dL
Specific Gravity, Urine: 1.02 (ref 1.005–1.030)
pH: 5 (ref 5.0–8.0)

## 2022-08-25 LAB — WET PREP, GENITAL
Clue Cells Wet Prep HPF POC: NONE SEEN
Sperm: NONE SEEN
Trich, Wet Prep: NONE SEEN
WBC, Wet Prep HPF POC: >=10 — AB (ref <10)
Yeast Wet Prep HPF POC: NONE SEEN

## 2022-08-25 LAB — HCG, QUANTITATIVE, PREGNANCY: hCG, Beta Chain, Quant, S: 126 m[IU]/mL — ABNORMAL HIGH (ref <5)

## 2022-08-25 NOTE — Discharge Instructions (Signed)
  [facility] Providers          [facility]  [address], [place], [place] [address]  [phone]  [facility]  [address], [place], [place] [address]  [phone]  [facility]  [address], [place], [place] [address]  [phone]  [facility] [address], [place], [place] [address] [phone]  [facility] [place]  [address], [place], [place] [address]  [phone]  [facility]  [address]First floor), [place], [place] [address]  [phone]  [facility]  [address], [place], [place] [address]  [phone]  [facility]  [address], [place], [place] [address]  [phone]  [facility]  [address], [place], [place] [address]  [phone]  [facility]  [address], [place], [place] [address]  [phone]  [facility]  [address], [place], [place] [address]  [phone]  [facility]  [address], [place], [place] [address]  [phone]  [facility]   [address], [place], [place] [address]  [phone]  [facility]  [address], [place], [place] [address]   [phone]  [facility] [address], [place], [place] [address] [phone]  [facility]  [address], , [place] [address]  [phone]         

## 2022-08-25 NOTE — MAU Note (Signed)
.  Virginia Fitzgerald is a 37 y.o. at [redacted]w[redacted]d here in MAU reporting: abdominal tightening and pelvic pressure that occurs only while laying or sitting-resolves with ambulation. Also reports vaginal discharge and itching-"milky colored fluid" States she is unsure of gestational age-due to miscarriage that occurred in May-last episode of vaginal bleeding was May 17. Has had + pregnancy test at Urgent care 2 weeks ago  Onset of complaint: 2330  Lab orders placed from triage:  see orders

## 2022-08-25 NOTE — MAU Provider Note (Signed)
History     CSN: [id number]  Arrival date and time: 08/25/22 0151   None     Chief Complaint  Patient presents with   Abdominal Pain   Vaginal Discharge   Vaginal Itching   Pelvic Pain    Virginia Fitzgerald is a 37 y.o. [id number] at [redacted]w[redacted]d by approximate LMP who receives care at [facility].  She presents today for pelvic pressure.  She reports her symptoms started around 2000.  She reports the pain is intermittent and describes it as "the baby moving."  She reports the pain is located mostly "up under my stomach and in my navel."  She reports improvement with ambulation and worsening with rest. She also reports vaginal discharge and itching that has been present throughout the day.    OB History     Gravida  7   Para  4   Term  3   Preterm  1   AB  1   Living  3      SAB  1   IAB  0   Ectopic  0   Multiple  0   Live Births  3        Obstetric Comments  Largest prior was 7lbs 7oz. No issues with deliveries.          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   A1C 6.5    Fatty liver    GDM (gestational diabetes mellitus)    WITH 2019 PREGNANCY ONLY   GERD (gastroesophageal reflux disease)    diet controlled - no meds   Headache    last one 05/25/17 - otc med   History of blood transfusion 2012   At [facility]   History of DVT of lower extremity    History of hiatal hernia    Hypertension    Iron deficiency anemia 11/14/2011   Metallic taste 01/14/2020   MRSA infection    not in EPIC - per patient in 2011   Pulmonary embolism (HCC)    S/P IVC filter    Thrombocytosis     Past Surgical History:  Procedure Laterality Date   DILATION AND EVACUATION N/A 05/29/2017   Procedure: DILATATION AND EVACUATION;  Surgeon: Willodean Rosenthal, MD;  Location: [facility] ORS;  Service: Gynecology;  Laterality: N/A;   DILATION AND EVACUATION  N/A 02/03/2021   Procedure: DILATATION AND EVACUATION;  Surgeon: Adam Phenix, MD;  Location: [facility] LD ORS;  Service: Gynecology;  Laterality: N/A;   DILATION AND EVACUATION N/A 06/04/2022   Procedure: DILATATION AND EVACUATION;  Surgeon: Catalina Antigua, MD;  Location: [facility] OR;  Service: Gynecology;  Laterality: N/A;   WISDOM TOOTH EXTRACTION      Family History  Problem Relation Age of Onset   Diabetes Mother    Hypertension Mother    Hypertension Maternal Uncle    Diabetes Maternal Uncle    Hypertension Maternal Grandmother     Social History   Tobacco Use   Smoking status: Never   Smokeless tobacco: Never   Tobacco comments:    never used tobacco  Vaping Use   Vaping status: Never Used  Substance Use Topics   Alcohol use: No    Alcohol/week: 0.0 standard drinks of alcohol   Drug use: No    Allergies: No Known Allergies  Medications Prior to Admission  Medication Sig  Dispense Refill Last Dose   ferrous gluconate (FERGON) 324 MG tablet Take 1 tablet (324 mg total) by mouth every other day. 60 tablet 1 Past Week   acetaminophen (TYLENOL) 325 MG tablet Take 2 tablets (650 mg total) by mouth every 4 (four) hours as needed (for pain scale < 4). 60 tablet 1    bacitracin ointment Apply 1 Application topically 2 (two) times daily. 120 g 0    cetirizine (ZYRTEC ALLERGY) 10 MG tablet Take 1 tablet (10 mg total) by mouth daily. (Patient not taking: Reported on 11/15/2020) 14 tablet 0    famotidine (PEPCID) 20 MG tablet Take 1 tablet (20 mg total) by mouth 2 (two) times daily. 30 tablet 0    ibuprofen (ADVIL) 600 MG tablet Take 1 tablet (600 mg total) by mouth every 6 (six) hours as needed. 60 tablet 3    insulin aspart (NOVOLOG FLEXPEN) 100 UNIT/ML FlexPen Inject 0-9 Units into the skin 3 (three) times daily with meals. (Patient not taking: Reported on 04/20/2022) 15 mL 1    insulin detemir (LEVEMIR) 100 UNIT/ML FlexPen Inject 20 Units into the skin daily. (Patient not taking: Reported on  04/20/2022) 15 mL 1    Insulin Pen Needle (NOVOFINE) 30G X 8 MM MISC Inject 10 each into the skin as needed. (Patient not taking: Reported on 04/20/2022) 3 packet 2    labetalol (NORMODYNE) 100 MG tablet Take 2 tablets (200 mg total) by mouth 2 (two) times daily. (Patient taking differently: Take 200 mg by mouth 2 (two) times daily. Taking differently) 60 tablet 3    metoCLOPramide (REGLAN) 10 MG tablet Take 1 tablet (10 mg total) by mouth every 6 (six) hours. (Patient not taking: Reported on 05/21/2022) 30 tablet 0    rivaroxaban (XARELTO) 20 MG TABS tablet Take 1 tablet (20 mg total) by mouth daily. 30 tablet 2     Review of Systems  Gastrointestinal:  Positive for abdominal pain. Negative for constipation, diarrhea, nausea and vomiting.  Genitourinary:  Positive for pelvic pain (Pressure) and vaginal discharge. Negative for difficulty urinating, dyspareunia and dysuria.   Physical Exam   Pulse (!) 108, temperature 98.9 F (37.2 C), temperature source Oral, resp. rate 18, height 5\' 2"  (1.575 m), weight 133.3 kg, last menstrual period 06/15/2022.  Physical Exam Constitutional:      General: She is not in acute distress.    Appearance: She is well-developed. She is obese. She is not ill-appearing.  HENT:     Head: Normocephalic and atraumatic.  Eyes:     Conjunctiva/sclera: Conjunctivae normal.  Pulmonary:     Effort: Pulmonary effort is normal. No respiratory distress.  Abdominal:     Palpations: Abdomen is soft.     Tenderness: There is abdominal tenderness in the suprapubic area.  Musculoskeletal:        General: Normal range of motion.     Cervical back: Normal range of motion.  Skin:    General: Skin is warm and dry.  Neurological:     Mental Status: She is alert and oriented to person, place, and time.  Psychiatric:        Mood and Affect: Mood normal.        Behavior: Behavior normal.     MAU Course  Procedures Results for orders placed or performed during the hospital  encounter of 08/25/22 (from the past 24 hour(s))  Wet prep, genital     Status: Abnormal   Collection Time: 08/25/22  2:39 AM  Specimen: Vaginal  Result Value Ref Range   Yeast Wet Prep HPF POC NONE SEEN NONE SEEN   Trich, Wet Prep NONE SEEN NONE SEEN   Clue Cells Wet Prep HPF POC NONE SEEN NONE SEEN   WBC, Wet Prep HPF POC >=10 (A) <10   Sperm NONE SEEN   Urinalysis, Routine w reflex microscopic -Urine, Clean Catch     Status: Abnormal   Collection Time: 08/25/22  2:39 AM  Result Value Ref Range   Color, Urine YELLOW YELLOW   APPearance HAZY (A) CLEAR   Specific Gravity, Urine 1.020 1.005 - 1.030   pH 5.0 5.0 - 8.0   Glucose, UA NEGATIVE NEGATIVE mg/dL   Hgb urine dipstick NEGATIVE NEGATIVE   Bilirubin Urine NEGATIVE NEGATIVE   Ketones, ur NEGATIVE NEGATIVE mg/dL   Protein, ur NEGATIVE NEGATIVE mg/dL   Nitrite NEGATIVE NEGATIVE   Leukocytes,Ua LARGE (A) NEGATIVE   RBC / HPF 11-20 0 - 5 RBC/hpf   WBC, UA 21-50 0 - 5 WBC/hpf   Bacteria, UA RARE (A) NONE SEEN   Squamous Epithelial / HPF 11-20 0 - 5 /HPF   Mucus PRESENT   hCG, quantitative, pregnancy     Status: Abnormal   Collection Time: 08/25/22  5:09 AM  Result Value Ref Range   hCG, Beta Chain, Quant, S 126 (H) <5 mIU/mL   US OB LESS THAN 14 WEEKS WITH OB TRANSVAGINAL  Result Date: 08/25/2022 CLINICAL DATA:  Pelvic pressure. EXAM: OBSTETRIC <14 WK [name] AND TRANSVAGINAL OB US TECHNIQUE: Both transabdominal and transvaginal ultrasound examinations were performed for complete evaluation of the gestation as well as the maternal uterus, adnexal regions, and pelvic cul-de-sac. Transvaginal technique was performed to assess early pregnancy. COMPARISON:  None Available. FINDINGS: Intrauterine gestational sac: None Yolk sac:  Not Visualized. Embryo:  Not Visualized. Cardiac Activity: Not Visualized. Heart Rate: N/A  bpm Subchorionic hemorrhage:  None visualized. Maternal uterus/adnexae: A 1.8 cm x 1.2 cm x 1.6 cm heterogeneous posterior  uterine fibroid is suspected. The right ovary measures 3.1 cm x 2.1 cm x 3.3 cm and is normal in appearance. The left ovary measures 3.0 cm x 2.1 cm x 2.7 cm and is normal in appearance. A trace amount of pelvic free fluid is seen. IMPRESSION: 1. No evidence of an intrauterine pregnancy. Correlation with follow-up pelvic ultrasound and serial beta HCG levels is recommended. 2. Heterogeneous posterior uterine fibroid. Electronically Signed   By: Aram Candela M.D.   On: 08/25/2022 04:35     MDM Cultures: Wet prep and GC/CT Labs: hCG, UA Ultrasound Coordination of follow-up Assessment and Plan  37 year old G7 P3-1-1-3 at 10.1 Pelvic pressure  -POC reviewed. -Labs ordered. -Exam performed. -Cultures collected by nurse. -Discussed how infection process can cause increased pressure and or discomfort during pregnancy. -Due to body habitus, will send for ultrasound for FHT. -Await results  Cherre Robins 08/25/2022, 3:55 AM   Reassessment (4:57 AM) -Ultrasound returns without identifiable IUP. -Patient informed of findings and hCG ordered. -Discussed need for follow-up based on findings and will send MyChart message to coordinate care as appropriate. -Patient verbalized understanding and denies concerns. -Precautions reviewed. -Discharged to home in stable condition.  Cherre Robins MSN, CNM Advanced Practice Provider, Center for [facility]   Addendum: hCG returns at 127 Mychart message sent and patient scheduled for f/u lab on Monday July 29th at [facility].   Cherre Robins MSN, CNM Advanced Practice Provider, Center for [employer]

## 2022-08-27 ENCOUNTER — Other Ambulatory Visit: Payer: Self-pay

## 2022-08-27 LAB — GC/CHLAMYDIA PROBE AMP (~~LOC~~) NOT AT ARMC
Chlamydia: NEGATIVE
Comment: NEGATIVE
Comment: NORMAL
Neisseria Gonorrhea: NEGATIVE

## 2022-08-30 ENCOUNTER — Other Ambulatory Visit: Payer: BLUE CROSS/BLUE SHIELD

## 2022-09-04 ENCOUNTER — Ambulatory Visit (INDEPENDENT_AMBULATORY_CARE_PROVIDER_SITE_OTHER): Payer: BLUE CROSS/BLUE SHIELD

## 2022-09-04 DIAGNOSIS — O021 Missed abortion: Secondary | ICD-10-CM

## 2022-09-04 DIAGNOSIS — Z3A01 Less than 8 weeks gestation of pregnancy: Secondary | ICD-10-CM

## 2022-09-04 NOTE — Progress Notes (Signed)
Pt presents for Hcg.

## 2022-09-06 ENCOUNTER — Other Ambulatory Visit: Payer: BLUE CROSS/BLUE SHIELD

## 2022-09-11 ENCOUNTER — Other Ambulatory Visit: Payer: Medicaid Other

## 2022-10-17 ENCOUNTER — Encounter: Payer: BC Managed Care – PPO | Admitting: Obstetrics and Gynecology

## 2023-07-30 ENCOUNTER — Ambulatory Visit: Payer: Self-pay

## 2023-07-31 ENCOUNTER — Ambulatory Visit: Payer: Self-pay

## 2023-08-01 ENCOUNTER — Ambulatory Visit (INDEPENDENT_AMBULATORY_CARE_PROVIDER_SITE_OTHER): Payer: Self-pay

## 2023-08-01 VITALS — BP 136/70 | HR 89

## 2023-08-01 DIAGNOSIS — Z3201 Encounter for pregnancy test, result positive: Secondary | ICD-10-CM | POA: Diagnosis not present

## 2023-08-01 DIAGNOSIS — O09529 Supervision of elderly multigravida, unspecified trimester: Secondary | ICD-10-CM | POA: Insufficient documentation

## 2023-08-01 LAB — POCT URINE PREGNANCY: Preg Test, Ur: POSITIVE — AB

## 2023-08-01 MED ORDER — PROMETHAZINE HCL 25 MG PO TABS: 25.0000 mg | ORAL_TABLET | Freq: Four times a day (QID) | ORAL | 1 refills | Status: DC | PRN

## 2023-08-01 MED ORDER — PRENATAL 28-0.8 MG PO TABS: 1.0000 | ORAL_TABLET | Freq: Every day | ORAL | 12 refills | Status: AC

## 2023-08-01 NOTE — Progress Notes (Signed)
 Kamee Fury here for a UPT. Pt had a positive upt at home. LMP is 06/28/23.     UPT in office Positive.    Reviewed medications and informed to start a PNV, if not already. Pt to follow up in 4 weeks for New OB visit.

## 2023-08-29 ENCOUNTER — Encounter

## 2023-09-01 ENCOUNTER — Inpatient Hospital Stay (HOSPITAL_COMMUNITY)

## 2023-09-01 ENCOUNTER — Inpatient Hospital Stay (HOSPITAL_COMMUNITY)
Admission: AD | Admit: 2023-09-01 | Discharge: 2023-09-02 | Disposition: A | Discharge status: Home / Self Care | Attending: Obstetrics & Gynecology | Admitting: Obstetrics & Gynecology

## 2023-09-01 ENCOUNTER — Other Ambulatory Visit: Payer: Self-pay

## 2023-09-01 DIAGNOSIS — O039 Complete or unspecified spontaneous abortion without complication: Secondary | ICD-10-CM | POA: Diagnosis not present

## 2023-09-01 DIAGNOSIS — D508 Other iron deficiency anemias: Secondary | ICD-10-CM | POA: Diagnosis not present

## 2023-09-01 DIAGNOSIS — O24119 Pre-existing diabetes mellitus, type 2, in pregnancy, unspecified trimester: Secondary | ICD-10-CM

## 2023-09-01 DIAGNOSIS — I2782 Chronic pulmonary embolism: Secondary | ICD-10-CM | POA: Insufficient documentation

## 2023-09-01 DIAGNOSIS — E119 Type 2 diabetes mellitus without complications: Secondary | ICD-10-CM | POA: Insufficient documentation

## 2023-09-01 DIAGNOSIS — Z3A09 9 weeks gestation of pregnancy: Secondary | ICD-10-CM

## 2023-09-01 DIAGNOSIS — I10 Essential (primary) hypertension: Secondary | ICD-10-CM | POA: Insufficient documentation

## 2023-09-01 DIAGNOSIS — Z6841 Body Mass Index (BMI) 40.0 and over, adult: Secondary | ICD-10-CM

## 2023-09-01 DIAGNOSIS — N898 Other specified noninflammatory disorders of vagina: Secondary | ICD-10-CM | POA: Diagnosis present

## 2023-09-01 LAB — HCG, QUANTITATIVE, PREGNANCY: hCG, Beta Chain, Quant, S: 23280 m[IU]/mL — ABNORMAL HIGH (ref <5)

## 2023-09-01 LAB — CBC WITH DIFFERENTIAL/PLATELET
Abs Immature Granulocytes: 0.09 K/uL — ABNORMAL HIGH (ref 0.00–0.07)
Basophils Absolute: 0.1 K/uL (ref 0.0–0.1)
Basophils Relative: 0 %
Eosinophils Absolute: 0.2 K/uL (ref 0.0–0.5)
Eosinophils Relative: 1 %
HCT: 30.8 % — ABNORMAL LOW (ref 36.0–46.0)
Hemoglobin: 8.6 g/dL — ABNORMAL LOW (ref 12.0–15.0)
Immature Granulocytes: 1 %
Lymphocytes Relative: 24 %
Lymphs Abs: 3.6 K/uL (ref 0.7–4.0)
MCH: 16.7 pg — ABNORMAL LOW (ref 26.0–34.0)
MCHC: 27.9 g/dL — ABNORMAL LOW (ref 30.0–36.0)
MCV: 59.7 fL — ABNORMAL LOW (ref 80.0–100.0)
Monocytes Absolute: 0.8 K/uL (ref 0.1–1.0)
Monocytes Relative: 5 %
Neutro Abs: 10.3 K/uL — ABNORMAL HIGH (ref 1.7–7.7)
Neutrophils Relative %: 69 %
Platelets: 544 K/uL — ABNORMAL HIGH (ref 150–400)
RBC: 5.16 MIL/uL — ABNORMAL HIGH (ref 3.87–5.11)
RDW: 22.4 % — ABNORMAL HIGH (ref 11.5–15.5)
WBC: 15 K/uL — ABNORMAL HIGH (ref 4.0–10.5)
nRBC: 0 % (ref 0.0–0.2)

## 2023-09-01 LAB — COMPREHENSIVE METABOLIC PANEL WITH GFR
ALT: 18 U/L (ref 0–44)
AST: 12 U/L — ABNORMAL LOW (ref 15–41)
Albumin: 3.3 g/dL — ABNORMAL LOW (ref 3.5–5.0)
Alkaline Phosphatase: 74 U/L (ref 38–126)
Anion gap: 10 (ref 5–15)
BUN: 7 mg/dL (ref 6–20)
CO2: 23 mmol/L (ref 22–32)
Calcium: 9.1 mg/dL (ref 8.9–10.3)
Chloride: 101 mmol/L (ref 98–111)
Creatinine, Ser: 0.53 mg/dL (ref 0.44–1.00)
GFR, Estimated: >60 mL/min (ref >60)
Glucose, Bld: 137 mg/dL — ABNORMAL HIGH (ref 70–99)
Potassium: 3.6 mmol/L (ref 3.5–5.1)
Sodium: 134 mmol/L — ABNORMAL LOW (ref 135–145)
Total Bilirubin: 0.3 mg/dL (ref 0.0–1.2)
Total Protein: 7.3 g/dL (ref 6.5–8.1)

## 2023-09-01 LAB — URINALYSIS, ROUTINE W REFLEX MICROSCOPIC
Bilirubin Urine: NEGATIVE
Glucose, UA: NEGATIVE mg/dL
Ketones, ur: NEGATIVE mg/dL
Nitrite: NEGATIVE
Protein, ur: 30 mg/dL — AB
Specific Gravity, Urine: 1.02 (ref 1.005–1.030)
pH: 7 (ref 5.0–8.0)

## 2023-09-01 LAB — HIV ANTIBODY (ROUTINE TESTING W REFLEX): HIV Screen 4th Generation wRfx: NONREACTIVE

## 2023-09-01 LAB — WET PREP, GENITAL
Clue Cells Wet Prep HPF POC: NONE SEEN
Sperm: NONE SEEN
Trich, Wet Prep: NONE SEEN
WBC, Wet Prep HPF POC: <10 (ref <10)
Yeast Wet Prep HPF POC: NONE SEEN

## 2023-09-01 NOTE — MAU Note (Signed)
 Virginia Fitzgerald is a 38 y.o. at [redacted]w[redacted]d here in MAU reporting: she's having lower back pain that began yesterday.  States pain is a constant soreness and aching.  Denies taking meds to treat.  Also reports a pink discharge with wiping that began today.  Denies recent intercourse.  LMP: 06/01/2023 Onset of complaint: yesterday Pain score: 7 Vitals:   09/01/23 1739  BP: (!) 145/91  Pulse: 99  Resp: 19  Temp: 98.8 F (37.1 C)  SpO2: 100%     FHT: NA  Lab orders placed from triage: UA

## 2023-09-02 ENCOUNTER — Encounter (HOSPITAL_COMMUNITY): Payer: Self-pay | Admitting: Obstetrics & Gynecology

## 2023-09-02 DIAGNOSIS — Z3A09 9 weeks gestation of pregnancy: Secondary | ICD-10-CM

## 2023-09-02 DIAGNOSIS — O039 Complete or unspecified spontaneous abortion without complication: Secondary | ICD-10-CM

## 2023-09-02 LAB — GC/CHLAMYDIA PROBE AMP (~~LOC~~) NOT AT ARMC
Chlamydia: POSITIVE — AB
Comment: NEGATIVE
Comment: NORMAL
Neisseria Gonorrhea: NEGATIVE

## 2023-09-02 NOTE — Discharge Instructions (Addendum)
 You were seen at the maternity assessment unit for bleeding in pregnancy.  You had an ultrasound that showed a pregnancy that was 9 weeks and 1 day with no fetal heart rate.  Your pregnancy is not progressing.  The spotting that you are having is likely a sign of your body starting to pass this pregnancy.  We discussed multiple options including -Waiting for your body to pass the pregnancy -Medication called Cytotec  that will help help you pass the pregnancy -Scheduling for a dilation and evacuation  You decided to try to pass the pregnancy on your own.  We discussed your medications and you reported that you were no longer taking your blood pressure medicines, your diabetes medicines and you are no longer taking the blood thinner Xarelto .   It is very important that we discuss your medical conditions at your follow-up visit in the outpatient setting to try to maximize and optimize your baseline health.  A [facility] to the [facility] to try to have you seen in the next 2 to 3 weeks.  At this appointment you will also need an ultrasound to help confirm passage of your pregnancy.  Please return to the maternity assessment unit for: Heavy bleeding that is filling up a pad every hour Dizziness lightheadedness or feeling like you are going to pass out Severe abdominal pain Or any other concern

## 2023-09-02 NOTE — MAU Provider Note (Signed)
 History     CSN: [id number]  Arrival date and time: 09/01/23 1716   Event Date/Time   First Provider Initiated Contact with Patient 09/02/23 0031      Chief Complaint  Patient presents with   Back Pain   Vaginal Discharge   Back Pain Pertinent negatives include no abdominal pain, chest pain, dysuria or fever.  Vaginal Discharge The patient's primary symptoms include vaginal discharge. Associated symptoms include back pain. Pertinent negatives include no abdominal pain, chills, diarrhea, dysuria, fever, flank pain, nausea, rash, sore throat or vomiting.    Virginia Fitzgerald is at [id number] at [redacted]w[redacted]d presenting with vaginal bleeding, noted first about 2 weeks ago mild spotting that the patient did not have any medical attention for.  Vaginal bleeding today is light in nature noted mostly when she wipes.  She does not noted any blood in the toilet.  She does report some mild abdominal cramping.  Patient has a past medical history that is significant for type 2 diabetes, chronic hypertension, pulmonary embolism with cor pulmonale-supposed to be on lifelong anticoagulation but is no longer taking her anticoagulation medication since over a year ago.  No OB provider this pregnancy Last seen at [facility] in 05/2022  OB History     Gravida  8   Para  4   Term  3   Preterm  1   AB  4   Living  3      SAB  4   IAB  0   Ectopic  0   Multiple  0   Live Births  3        Obstetric Comments  Largest prior was 7lbs 7oz. No issues with deliveries.          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   A1C 6.5    Fatty liver    GDM (gestational diabetes mellitus)    WITH 2019 PREGNANCY ONLY   GERD (gastroesophageal reflux disease)    diet controlled - no meds   Headache    last one 05/25/17 - otc med   History of blood transfusion 2012   At [facility]   History  of DVT of lower extremity    History of hiatal hernia    Hypertension    Iron  deficiency anemia 11/14/2011   Metallic taste 01/14/2020   MRSA infection    not in EPIC - per patient in 2011   Pulmonary embolism (HCC)    S/P IVC filter    Thrombocytosis     Past Surgical History:  Procedure Laterality Date   DILATION AND EVACUATION N/A 05/29/2017   Procedure: DILATATION AND EVACUATION;  Surgeon: Corene Coy, MD;  Location: [facility] ORS;  Service: Gynecology;  Laterality: N/A;   DILATION AND EVACUATION N/A 02/03/2021   Procedure: DILATATION AND EVACUATION;  Surgeon: Eveline Lynwood MATSU, MD;  Location: [facility] LD ORS;  Service: Gynecology;  Laterality: N/A;   DILATION AND EVACUATION N/A 06/04/2022   Procedure: DILATATION AND EVACUATION;  Surgeon: Alger Gong, MD;  Location: [facility] OR;  Service: Gynecology;  Laterality: N/A;   WISDOM TOOTH EXTRACTION      Family History  Problem Relation Age of Onset   Diabetes Mother    Hypertension Mother    Hypertension Maternal Uncle    Diabetes Maternal Uncle    Hypertension Maternal Grandmother  Social History   Tobacco Use   Smoking status: Never   Smokeless tobacco: Never   Tobacco comments:    never used tobacco  Vaping Use   Vaping status: Never Used  Substance Use Topics   Alcohol use: No    Alcohol/week: 0.0 standard drinks of alcohol   Drug use: No    Allergies: No Known Allergies  No medications prior to admission.    Review of Systems  Constitutional:  Negative for chills and fever.  HENT:  Negative for congestion and sore throat.   Eyes:  Negative for pain and visual disturbance.  Respiratory:  Negative for cough, chest tightness and shortness of breath.   Cardiovascular:  Negative for chest pain.  Gastrointestinal:  Negative for abdominal pain, diarrhea, nausea and vomiting.  Endocrine: Negative for cold intolerance and heat intolerance.  Genitourinary:  Positive for vaginal discharge. Negative for dysuria and flank  pain.  Musculoskeletal:  Positive for back pain.  Skin:  Negative for rash.  Allergic/Immunologic: Negative for food allergies.  Neurological:  Negative for dizziness and light-headedness.  Psychiatric/Behavioral:  Negative for agitation.    Physical Exam   Blood pressure (!) 145/91, pulse 99, temperature 98.8 F (37.1 C), temperature source Oral, resp. rate 19, height 5' 3 (1.6 m), weight 134.4 kg, last menstrual period 06/28/2023, SpO2 100%.  Patient Vitals for the past 24 hrs:  BP Temp Temp src Pulse Resp SpO2 Height Weight  09/01/23 1739 (!) 145/91 98.8 F (37.1 C) Oral 99 19 100 % -- --  09/01/23 1736 -- -- -- -- -- -- 5' 3 (1.6 m) 134.4 kg    Physical Exam Vitals and nursing note reviewed.  Constitutional:      Appearance: Normal appearance.  HENT:     Head: Normocephalic and atraumatic.     Nose: Nose normal.     Mouth/Throat:     Mouth: Mucous membranes are moist.  Eyes:     Conjunctiva/sclera: Conjunctivae normal.  Cardiovascular:     Rate and Rhythm: Normal rate.  Pulmonary:     Effort: Pulmonary effort is normal.  Abdominal:     General: Abdomen is flat.     Palpations: Abdomen is soft.  Musculoskeletal:     Cervical back: Normal range of motion.  Skin:    General: Skin is warm.     Capillary Refill: Capillary refill takes less than 2 seconds.  Neurological:     General: No focal deficit present.     Mental Status: She is alert.  Psychiatric:        Mood and Affect: Mood normal.     MAU Course  Procedures   MDM: high  This patient presents to the ED for concern of   Chief Complaint  Patient presents with   Back Pain   Vaginal Discharge     These complains involves an extensive number of treatment options, and is a complaint that carries with it a high risk of complications and morbidity.  The differential diagnosis for  1.vaginal bleeding in early pregnancy INCLUDES threatened miscarriage, ectopic pregnancy (unless IUP confirmed), normal  variant bleeding with live IUP-mostly likely subchorionic hemorrhage in this case. Most likely for this patient is missed abortion/pregnancy loss given her US  findings.    Co morbidities that complicate the patient evaluation: Patient Active Problem List   Diagnosis Date Noted   History of IUFD 06/01/2022   Insulin -requiring or dependent type II diabetes mellitus (HCC) 01/14/2021   Chronic pulmonary embolism without acute  cor pulmonale (HCC) 01/14/2021   Mixed hyperlipidemia 01/14/2021   S/P IVC filter 12/05/2020   Anticoagulation in pregnancy 10/20/2020   Rubella non-immune status, antepartum 10/20/2020   Pre-existing type 2 diabetes mellitus during pregnancy, antepartum 10/19/2020   History of COVID-19 11/25/2019   Type 2 diabetes mellitus without complication, without long-term current use of insulin  (HCC) 11/25/2019   History of recurrent deep vein thrombosis (DVT) 11/25/2019   Diabetes type 2, 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   Essential hypertension, benign 03/20/2017   Recurrent pulmonary embolism (HCC) 03/20/2017   BMI 50.0-59.9, adult (HCC) 02/28/2017     External records from outside source obtained and reviewed including Scanned media records, CareEverywhere, and Prenatal care records   I ordered, and personally interpreted labs.  The pertinent results include:   Results for orders placed or performed during the hospital encounter of 09/01/23 (from the past 24 hours)  Urinalysis, Routine w reflex microscopic -Urine, Clean Catch     Status: Abnormal   Collection Time: 09/01/23  5:41 PM  Result Value Ref Range   Color, Urine YELLOW YELLOW   APPearance CLOUDY (A) CLEAR   Specific Gravity, Urine 1.020 1.005 - 1.030   pH 7.0 5.0 - 8.0   Glucose, UA NEGATIVE NEGATIVE mg/dL   Hgb urine dipstick LARGE (A) NEGATIVE   Bilirubin Urine NEGATIVE NEGATIVE   Ketones, ur NEGATIVE NEGATIVE mg/dL   Protein, ur 30 (A) NEGATIVE mg/dL   Nitrite NEGATIVE NEGATIVE   Leukocytes,Ua  LARGE (A) NEGATIVE   RBC / HPF 6-10 0 - 5 RBC/hpf   WBC, UA 21-50 0 - 5 WBC/hpf   Bacteria, UA RARE (A) NONE SEEN   Squamous Epithelial / HPF 11-20 0 - 5 /HPF   WBC Clumps PRESENT    Mucus PRESENT    Amorphous Crystal PRESENT   CBC with Differential/Platelet     Status: Abnormal   Collection Time: 09/01/23  8:07 PM  Result Value Ref Range   WBC 15.0 (H) 4.0 - 10.5 K/uL   RBC 5.16 (H) 3.87 - 5.11 MIL/uL   Hemoglobin 8.6 (L) 12.0 - 15.0 g/dL   HCT 69.1 (L) 63.9 - 53.9 %   MCV 59.7 (L) 80.0 - 100.0 fL   MCH 16.7 (L) 26.0 - 34.0 pg   MCHC 27.9 (L) 30.0 - 36.0 g/dL   RDW 77.5 (H) 88.4 - 84.4 %   Platelets 544 (H) 150 - 400 K/uL   nRBC 0.0 0.0 - 0.2 %   Neutrophils Relative % 69 %   Neutro Abs 10.3 (H) 1.7 - 7.7 K/uL   Lymphocytes Relative 24 %   Lymphs Abs 3.6 0.7 - 4.0 K/uL   Monocytes Relative 5 %   Monocytes Absolute 0.8 0.1 - 1.0 K/uL   Eosinophils Relative 1 %   Eosinophils Absolute 0.2 0.0 - 0.5 K/uL   Basophils Relative 0 %   Basophils Absolute 0.1 0.0 - 0.1 K/uL   Immature Granulocytes 1 %   Abs Immature Granulocytes 0.09 (H) 0.00 - 0.07 K/uL  Comprehensive metabolic panel with GFR     Status: Abnormal   Collection Time: 09/01/23  8:07 PM  Result Value Ref Range   Sodium 134 (L) 135 - 145 mmol/L   Potassium 3.6 3.5 - 5.1 mmol/L   Chloride 101 98 - 111 mmol/L   CO2 23 22 - 32 mmol/L   Glucose, Bld 137 (H) 70 - 99 mg/dL   BUN 7 6 - 20 mg/dL   Creatinine, Ser 9.46  0.44 - 1.00 mg/dL   Calcium  9.1 8.9 - 10.3 mg/dL   Total Protein 7.3 6.5 - 8.1 g/dL   Albumin  3.3 (L) 3.5 - 5.0 g/dL   AST 12 (L) 15 - 41 U/L   ALT 18 0 - 44 U/L   Alkaline Phosphatase 74 38 - 126 U/L   Total Bilirubin 0.3 0.0 - 1.2 mg/dL   GFR, Estimated >39 >39 mL/min   Anion gap 10 5 - 15  hCG, quantitative, pregnancy     Status: Abnormal   Collection Time: 09/01/23  8:07 PM  Result Value Ref Range   hCG, Beta Chain, Quant, S 23,280 (H) <5 mIU/mL  HIV Antibody (routine testing w rflx)     Status:  None   Collection Time: 09/01/23  8:07 PM  Result Value Ref Range   HIV Screen 4th Generation wRfx Non Reactive Non Reactive  Wet prep, genital     Status: None   Collection Time: 09/01/23  8:53 PM   Specimen: Vaginal  Result Value Ref Range   Yeast Wet Prep HPF POC NONE SEEN NONE SEEN   Trich, Wet Prep NONE SEEN NONE SEEN   Clue Cells Wet Prep HPF POC NONE SEEN NONE SEEN   WBC, Wet Prep HPF POC <10 <10   Sperm NONE SEEN      Imaging Studies ordered:  I ordered imaging studies includingTransvaginal US  I independently visualized and interpreted imaging which showed IUP, measuring [redacted]w[redacted]d without FHR I agree with the radiologist interpretation   MAU Course:  2356 Discussed with patient results and confirmation of failed pregnancy. She was appropriately sad and wonders what is wrong with her that she cannot keep a pregnancy.  Reviewed her options for expectant management, cytotec , vs D&E. She has had an DE and would like to avoid for now and she reports failing cytotec  in a prior miscarriage. She would like expectant management. She has multiple co morbidities and will need close follow up   After the interventions noted above, I reevaluated the patient and found that they have :improved  Dispostion: discharged   Assessment and Plan   1. Miscarriage   2. [redacted] weeks gestation of pregnancy   3. Iron  deficiency anemia secondary to inadequate dietary iron  intake   4. BMI 50.0-59.9, adult (HCC)   5. Chronic pulmonary embolism without acute cor pulmonale, unspecified pulmonary embolism type (HCC)   6. Essential hypertension, benign   7. Pre-existing type 2 diabetes mellitus during pregnancy, antepartum    - Discharge stable condition - Expectant management desired for pregnancy loss-- confirmed with patient that she is NOT on anticoagulation and is not taking her insulin  or blood pressure medications. She should be on xarelto  due to history of PE but reports she had not taken anything  since she took lovenox  shots in April 2024 (her last pregnancy).  - Reviewed return precautions and would like [facility] with [facility] in [facility] weeks - Sent message to office to help get her scheduled with provider and for OB US    Suzen Maryan Masters 09/02/2023, 1:13 AM

## 2023-09-03 ENCOUNTER — Other Ambulatory Visit: Payer: Self-pay | Admitting: Obstetrics and Gynecology

## 2023-09-03 ENCOUNTER — Ambulatory Visit: Payer: Self-pay | Admitting: Obstetrics and Gynecology

## 2023-09-03 MED ORDER — AZITHROMYCIN 250 MG PO TABS: 1000.0000 mg | ORAL_TABLET | Freq: Once | ORAL | 0 refills | Status: AC

## 2023-09-06 MED ORDER — AZITHROMYCIN 250 MG PO TABS: 1000.0000 mg | ORAL_TABLET | Freq: Once | ORAL | 0 refills | Status: AC

## 2023-09-07 ENCOUNTER — Inpatient Hospital Stay (HOSPITAL_COMMUNITY)

## 2023-09-07 ENCOUNTER — Inpatient Hospital Stay (HOSPITAL_COMMUNITY)
Admission: AD | Admit: 2023-09-07 | Discharge: 2023-09-08 | Disposition: A | Discharge status: Home / Self Care | Payer: Self-pay | Attending: Family Medicine | Admitting: Family Medicine

## 2023-09-07 ENCOUNTER — Encounter (HOSPITAL_COMMUNITY): Payer: Self-pay | Admitting: Family Medicine

## 2023-09-07 DIAGNOSIS — E119 Type 2 diabetes mellitus without complications: Secondary | ICD-10-CM | POA: Insufficient documentation

## 2023-09-07 DIAGNOSIS — I10 Essential (primary) hypertension: Secondary | ICD-10-CM | POA: Insufficient documentation

## 2023-09-07 DIAGNOSIS — O039 Complete or unspecified spontaneous abortion without complication: Secondary | ICD-10-CM | POA: Insufficient documentation

## 2023-09-07 DIAGNOSIS — Z86711 Personal history of pulmonary embolism: Secondary | ICD-10-CM | POA: Diagnosis not present

## 2023-09-07 DIAGNOSIS — N939 Abnormal uterine and vaginal bleeding, unspecified: Secondary | ICD-10-CM | POA: Diagnosis present

## 2023-09-07 DIAGNOSIS — O021 Missed abortion: Secondary | ICD-10-CM | POA: Diagnosis present

## 2023-09-07 DIAGNOSIS — A5602 Chlamydial vulvovaginitis: Secondary | ICD-10-CM | POA: Diagnosis not present

## 2023-09-07 DIAGNOSIS — Z3A09 9 weeks gestation of pregnancy: Secondary | ICD-10-CM | POA: Diagnosis not present

## 2023-09-07 DIAGNOSIS — O0990 Supervision of high risk pregnancy, unspecified, unspecified trimester: Secondary | ICD-10-CM

## 2023-09-07 DIAGNOSIS — D509 Iron deficiency anemia, unspecified: Secondary | ICD-10-CM | POA: Diagnosis not present

## 2023-09-07 DIAGNOSIS — R1030 Lower abdominal pain, unspecified: Secondary | ICD-10-CM | POA: Diagnosis present

## 2023-09-07 DIAGNOSIS — A749 Chlamydial infection, unspecified: Secondary | ICD-10-CM

## 2023-09-07 LAB — CBC
HCT: 28.4 % — ABNORMAL LOW (ref 36.0–46.0)
Hemoglobin: 7.9 g/dL — ABNORMAL LOW (ref 12.0–15.0)
MCH: 16.7 pg — ABNORMAL LOW (ref 26.0–34.0)
MCHC: 27.8 g/dL — ABNORMAL LOW (ref 30.0–36.0)
MCV: 60 fL — ABNORMAL LOW (ref 80.0–100.0)
Platelets: 510 K/uL — ABNORMAL HIGH (ref 150–400)
RBC: 4.73 MIL/uL (ref 3.87–5.11)
RDW: 21.8 % — ABNORMAL HIGH (ref 11.5–15.5)
WBC: 16.6 K/uL — ABNORMAL HIGH (ref 4.0–10.5)
nRBC: 0 % (ref 0.0–0.2)

## 2023-09-07 MED ORDER — OXYCODONE HCL 5 MG PO TABS
5.0000 mg | ORAL_TABLET | Freq: Four times a day (QID) | ORAL | 0 refills | Status: AC | PRN
Start: 2023-09-07 — End: ?

## 2023-09-07 MED ORDER — FERROUS GLUCONATE 324 (38 FE) MG PO TABS: 324.0000 mg | ORAL_TABLET | ORAL | 3 refills | Status: AC

## 2023-09-07 MED ORDER — OXYCODONE HCL 5 MG PO TABS
10.0000 mg | ORAL_TABLET | Freq: Once | ORAL | Status: AC
  Administered 2023-09-07: 10 mg via ORAL
  Filled 2023-09-07: qty 2

## 2023-09-07 NOTE — MAU Note (Signed)
 MAU Triage Note:  .Virginia Fitzgerald is a 38 y.o. at [redacted]w[redacted]d here in MAU reporting: was seen for VB last Sunday and informed she was having a miscarriage. Wednesday she passed a dark red, jelly substance along with light bleeding. 2 hours ago she started having heavy vaginal bleeding and has changed pads 3x since then. She also reports lower abdominal pain that is cramping and stabbing. She also reports intermittent hot flashes and chills.  Patient complaint: miscarriage pain bleeding heavy  Pain Score: 10-Worst pain ever Pain Location: Abdomen     Onset of complaint: on-going LMP: Patient's last menstrual period was 06/28/2023 (approximate).  Vitals:   09/07/23 2128  BP: (!) 148/93  Pulse: (!) 107  Resp: 20  Temp: 98.4 F (36.9 C)  SpO2: 97%     Lab orders placed from triage: will ask provider

## 2023-09-08 DIAGNOSIS — O039 Complete or unspecified spontaneous abortion without complication: Secondary | ICD-10-CM

## 2023-09-08 DIAGNOSIS — D509 Iron deficiency anemia, unspecified: Secondary | ICD-10-CM

## 2023-09-08 DIAGNOSIS — Z3A09 9 weeks gestation of pregnancy: Secondary | ICD-10-CM

## 2023-09-08 MED ORDER — AZITHROMYCIN 500 MG PO TABS: 1000.0000 mg | ORAL_TABLET | Freq: Once | ORAL | 0 refills | Status: AC

## 2023-09-08 NOTE — MAU Provider Note (Signed)
 Chief Complaint: Abdominal Pain and Vaginal Bleeding   Event Date/Time   First Provider Initiated Contact with Patient 09/07/23 2216      SUBJECTIVE HPI: Virginia Fitzgerald is a 38 y.o. [id number] at [redacted]w[redacted]d by LMP who presents to maternity admissions reporting heavy vaginal bleeding.  Patient started to have spotting 8/3 and was evaluated in the MAU at that time.  Was diagnosed with a missed abortion with approximately 9-week fetus without cardiac activity.  Preferred to manage expectantly at that time.  This afternoon she started to develop heavy bleeding and clots.  Pain increased this evening and she decided to come in.  Tylenol /ibuprofen  ineffective.  She does have some dizziness/lightheadedness when she stands up.  Notably patient has several comorbidities including IDA, history of PE (was on Xarelto  but has not taken it in over a year), chronic hypertension, type 2 diabetes.  She is not taking any medications for these and does not have a primary care provider currently.  She does have a follow-up scheduled at the med center in 2 weeks.  Diagnosed with chlamydia at MAU visit as well.  Has not taken medications for this.  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   A1C 6.5    Fatty liver    GDM (gestational diabetes mellitus)    WITH 2019 PREGNANCY ONLY   GERD (gastroesophageal reflux disease)    diet controlled - no meds   Headache    last one 05/25/17 - otc med   History of blood transfusion 2012   At [facility]   History of DVT of lower extremity    History of hiatal hernia    Hypertension    Iron  deficiency anemia 11/14/2011   Metallic taste 01/14/2020   MRSA infection    not in EPIC - per patient in 2011   Pulmonary embolism (HCC)    S/P IVC filter    Thrombocytosis    Past Surgical History:  Procedure Laterality Date   DILATION AND EVACUATION N/A  05/29/2017   Procedure: DILATATION AND EVACUATION;  Surgeon: Corene Coy, MD;  Location: [facility];  Service: Gynecology;  Laterality: N/A;   DILATION AND EVACUATION N/A 02/03/2021   Procedure: DILATATION AND EVACUATION;  Surgeon: Eveline Lynwood MATSU, MD;  Location: [facility] LD [facility];  Service: Gynecology;  Laterality: N/A;   DILATION AND EVACUATION N/A 06/04/2022   Procedure: DILATATION AND EVACUATION;  Surgeon: Alger Gong, MD;  Location: [facility] OR;  Service: Gynecology;  Laterality: N/A;   WISDOM TOOTH EXTRACTION     Social History   Socioeconomic History   Marital status: Single    Spouse name: Not on file   Number of children: Not on file   Years of education: Not on file   Highest education level: Not on file  Occupational History   Not on file  Tobacco Use   Smoking status: Never   Smokeless tobacco: Never   Tobacco comments:    never used tobacco  Vaping Use   Vaping status: Never Used  Substance and Sexual Activity   Alcohol use: No    Alcohol/week: 0.0 standard drinks of alcohol   Drug use: No   Sexual activity: Not Currently    Birth control/protection: None  Other Topics Concern   Not on file  Social History Narrative   ** Merged History Encounter **  Social Drivers of Corporate investment banker Strain: Not on file  Food Insecurity: Food Insecurity Present (12/09/2020)   Hunger Vital Sign    Worried About Running Out of Food in the Last Year: Sometimes true    Ran Out of Food in the Last Year: Never true  Transportation Needs: No Transportation Needs (12/09/2020)   PRAPARE - Administrator, Civil Service (Medical): No    Lack of Transportation (Non-Medical): No  Physical Activity: Not on file  Stress: Not on file  Social Connections: Not on file  Intimate Partner Violence: Not on file   No current facility-administered medications on file prior to encounter.   Current Outpatient Medications on File Prior to Encounter  Medication Sig Dispense  Refill   acetaminophen  (TYLENOL ) 325 MG tablet Take 2 tablets (650 mg total) by mouth every 4 (four) hours as needed (for pain scale < 4). 60 tablet 1   Prenatal 28-0.8 MG TABS Take 1 tablet by mouth daily. 30 tablet 12   [DISCONTINUED] Blood Pressure Monitoring (BLOOD PRESSURE KIT) DEVI 1 Device by Does not apply route as needed. (Patient not taking: No sig reported) 1 each 0   No Known Allergies  ROS:  Pertinent positives/negatives listed above.  I have reviewed patient's Past Medical Hx, Surgical Hx, Family Hx, Social Hx, medications and allergies.   Physical Exam  Patient Vitals for the past 24 hrs:  BP Temp Temp src Pulse Resp SpO2 Height Weight  09/08/23 0008 (!) 147/83 -- -- -- -- -- -- --  09/07/23 2133 (!) 155/84 -- -- -- -- -- -- --  09/07/23 2128 (!) 148/93 98.4 F (36.9 C) Oral (!) 107 20 97 % 5' 3 (1.6 m) 131.9 kg   Constitutional: Well-developed, well-nourished female.  In pain Cardiovascular: normal rate, mild tachycardia Respiratory: normal effort GI: Abd soft, tender to suprapubic palpation MS: Extremities nontender, no edema, normal ROM Neurologic: Alert and oriented x 4  PELVIC EXAM: Cervix pink, visually open approximately 1 cm, without lesion, very small clots in posterior fornix, no active bleeding through cervix  LAB RESULTS Results for orders placed or performed during the hospital encounter of 09/07/23 (from the past 24 hours)  CBC     Status: Abnormal   Collection Time: 09/07/23 10:51 PM  Result Value Ref Range   WBC 16.6 (H) 4.0 - 10.5 K/uL   RBC 4.73 3.87 - 5.11 MIL/uL   Hemoglobin 7.9 (L) 12.0 - 15.0 g/dL   HCT 71.5 (L) 63.9 - 53.9 %   MCV 60.0 (L) 80.0 - 100.0 fL   MCH 16.7 (L) 26.0 - 34.0 pg   MCHC 27.8 (L) 30.0 - 36.0 g/dL   RDW 78.1 (H) 88.4 - 84.4 %   Platelets 510 (H) 150 - 400 K/uL   nRBC 0.0 0.0 - 0.2 %      IMAGING US  OB Transvaginal Result Date: 09/07/2023 CLINICAL DATA:  Vaginal bleeding EXAM: TRANSVAGINAL OB ULTRASOUND  TECHNIQUE: Transvaginal ultrasound was performed for complete evaluation of the gestation as well as the maternal uterus, adnexal regions, and pelvic cul-de-sac. COMPARISON:  09/01/2023 FINDINGS: Intrauterine gestational sac: Absent Maternal uterus/adnexae: Uterine fibroid is again identified measuring 2.4 cm in greatest dimension. Prominent endometrium is noted consistent with the recent failed pregnancy. No significant increased vascularity is noted. Thrombus is noted in the endometrial canal as well. Right ovarian cyst is seen stable in appearance. IMPRESSION: Previously seen intrauterine gestational sac has passed consistent with failed pregnancy. Prominent endometrium is  noted consistent with the recent pregnant state. Thrombus is seen within the endometrial canal. Stable right ovarian cyst and uterine fibroid. Electronically Signed   By: Oneil Devonshire M.D.   On: 09/07/2023 23:08   US  OB Comp Less 14 Wks Result Date: 09/01/2023 CLINICAL DATA:  Vaginal bleeding with wiping, back pain. EXAM: OBSTETRIC <14 WK US  AND TRANSVAGINAL OB US  TECHNIQUE: Both transabdominal and transvaginal ultrasound examinations were performed for complete evaluation of the gestation as well as the maternal uterus, adnexal regions, and pelvic cul-de-sac. Transvaginal technique was performed to assess early pregnancy. COMPARISON:  August 25, 2022 FINDINGS: Intrauterine gestational sac: Single Yolk sac:  Not Visualized. Embryo:  Visualized. Cardiac Activity: Not Visualized. Heart Rate: N/A  bpm CRL:  24.1 mm   9 w   1 d                  US  EDC: April 04, 2024 Subchorionic hemorrhage:  None visualized. Maternal uterus/adnexae: A 2.2 cm x 2.5 cm x 3.1 cm uterine fibroid is noted. A 4.6 cm x 2.8 cm x 3.1 cm right ovarian cyst is seen. Left ovary is visualized and is normal in appearance. IMPRESSION: Findings meet definitive criteria for failed pregnancy. This follows SRU consensus guidelines: Diagnostic Criteria for Nonviable Pregnancy Early in  the First Trimester. [facility] [place] [phone]. Electronically Signed   By: Suzen Dials M.D.   On: 09/01/2023 21:44    MAU Management/MDM: Orders Placed This Encounter  Procedures   US  OB Transvaginal   CBC   Discharge patient    Meds ordered this encounter  Medications   oxyCODONE  (Oxy IR/ROXICODONE ) immediate release tablet 10 mg    Refill:  0   ferrous gluconate  (FERGON) 324 MG tablet    Sig: Take 1 tablet (324 mg total) by mouth every other day.    Dispense:  45 tablet    Refill:  3   oxyCODONE  (ROXICODONE ) 5 MG immediate release tablet    Sig: Take 1 tablet (5 mg total) by mouth every 6 (six) hours as needed for severe pain (pain score 7-10).    Dispense:  10 tablet    Refill:  0   azithromycin  (ZITHROMAX ) 500 MG tablet    Sig: Take 2 tablets (1,000 mg total) by mouth once for 1 dose.    Dispense:  2 tablet    Refill:  0    Patient presents with heavy vaginal bleeding/passage of clots in setting of known missed abortion at approximately 9 weeks.  Patient was given oxycodone  for pain management prior to pelvic exam.  Pelvic exam showed no active bleeding, although small clots found and cervix visibly open.  Suspect miscarriage in process.  Obtained ultrasound to confirm passage of gestational sac and fetus.  Additionally obtained repeat CBC given anemia present on 8/4.  #SAB: ultrasound returned and showed passage of fetus and gestational sac consistent with miscarriage.  Did show small thrombus still present.  Discussed with patient that she should expect at least 1 more clot and bleeding for the next 1 to 2 weeks.  Discussed return precautions including fever/chills, heavier bleeding, pain unable to be managed at home.  Will send home with short prescription of oxycodone  for pain management.  Has follow-up at med center scheduled.  #Anemia: Hemoglobin 7.9 from 8.6.  She notes the long history of anemia for which the causes never been found.  At this time she is  hemodynamically stable and bleeding has substantially slowed, so there  is not reason for a blood transfusion at this time.  However we will send her home on p.o. iron  supplementation.  Given this and her other comorbidities, I have also provided with [facility] resources to find a primary care provider to manage these issues.  #Chlamydia: Diagnosed at last visit.  Has not picked up azithromycin .  Have resent prescription to the pharmacy for treatment.  Is no longer involved with her last partner.  ASSESSMENT 1. Miscarriage   2. Iron  deficiency anemia, unspecified iron  deficiency anemia type   3. Supervision of high risk pregnancy, antepartum   4. Chlamydia     PLAN Discharge home with strict return precautions. Allergies as of 09/08/2023   No Known Allergies      Medication List     STOP taking these medications    bacitracin  ointment   cetirizine  10 MG tablet Commonly known as: ZyrTEC  Allergy   famotidine  20 MG tablet Commonly known as: PEPCID    insulin  detemir 100 UNIT/ML FlexPen Commonly known as: LEVEMIR    Insulin  Pen Needle 30G X 8 MM Misc Commonly known as: NOVOFINE   labetalol  100 MG tablet Commonly known as: NORMODYNE    metoCLOPramide  10 MG tablet Commonly known as: REGLAN    NovoLOG  FlexPen 100 UNIT/ML FlexPen Generic drug: insulin  aspart   promethazine  25 MG tablet Commonly known as: PHENERGAN    rivaroxaban  20 MG Tabs tablet Commonly known as: XARELTO        TAKE these medications    acetaminophen  325 MG tablet Commonly known as: Tylenol  Take 2 tablets (650 mg total) by mouth every 4 (four) hours as needed (for pain scale < 4).   azithromycin  500 MG tablet Commonly known as: ZITHROMAX  Take 2 tablets (1,000 mg total) by mouth once for 1 dose.   ferrous gluconate  324 MG tablet Commonly known as: FERGON Take 1 tablet (324 mg total) by mouth every other day.   oxyCODONE  5 MG immediate release tablet Commonly known as: Roxicodone  Take 1 tablet (5 mg  total) by mouth every 6 (six) hours as needed for severe pain (pain score 7-10).   Prenatal 28-0.8 MG Tabs Take 1 tablet by mouth daily.         Almarie Moats, MD OB Fellow 09/08/2023  12:56 AM

## 2023-09-08 NOTE — Discharge Instructions (Signed)
 We recommended establishing primary care so you have a doctor to help make sure you remain healthy and have a place to be seen for any chronic conditions or if you get sick. [facility] has a way for patients to sign up online for a primary care visit.   There are options for Family Medicine- doctors that can see your whole family Internal Medicine- doctors that can see only people > 38 years old   Here is the website: [email or web address]

## 2023-09-23 ENCOUNTER — Ambulatory Visit

## 2023-09-23 ENCOUNTER — Ambulatory Visit (INDEPENDENT_AMBULATORY_CARE_PROVIDER_SITE_OTHER): Admitting: Obstetrics and Gynecology

## 2023-09-23 ENCOUNTER — Encounter: Payer: Self-pay | Admitting: Obstetrics and Gynecology

## 2023-09-23 VITALS — BP 153/138 | HR 96 | Ht 63.0 in | Wt 289.0 lb

## 2023-09-23 DIAGNOSIS — E119 Type 2 diabetes mellitus without complications: Secondary | ICD-10-CM | POA: Diagnosis not present

## 2023-09-23 DIAGNOSIS — Z7984 Long term (current) use of oral hypoglycemic drugs: Secondary | ICD-10-CM

## 2023-09-23 DIAGNOSIS — A749 Chlamydial infection, unspecified: Secondary | ICD-10-CM

## 2023-09-23 DIAGNOSIS — F32A Depression, unspecified: Secondary | ICD-10-CM | POA: Diagnosis not present

## 2023-09-23 DIAGNOSIS — I10 Essential (primary) hypertension: Secondary | ICD-10-CM

## 2023-09-23 MED ORDER — NIFEDIPINE ER OSMOTIC RELEASE 30 MG PO TB24: 30.0000 mg | ORAL_TABLET | Freq: Every day | ORAL | 2 refills | Status: AC

## 2023-09-23 MED ORDER — DOXYCYCLINE HYCLATE 100 MG PO CAPS: 100.0000 mg | ORAL_CAPSULE | Freq: Two times a day (BID) | ORAL | 0 refills | Status: AC

## 2023-09-23 NOTE — Progress Notes (Addendum)
 38 y.o. GYN presents for SAB Follow Up. Pt has not picked up Rx for +Chlamydia yet and does not know if her partner got treated.  Denies bleeding, pain or fever.  EPDS=7  BP elevated. Denies SOB, HA, blurry vision, floaters, swollen feet/ankles.

## 2023-09-23 NOTE — Progress Notes (Signed)
 38 yo P3 here for follow up from recent miscarriage. Patient diagnosis with missed Ab ob 09/01/23. Patient presented to MAU on 09/08/23 with vaginal bleeding and ultrasound demonstrating an empty uterus. Patient reports vaginal bleeding for another week following 09/08/23 MAU visit. Patient reports feeling well and is without any complaints. She does not desire another pregnancy and would like to have another Mirena IUD. Patient with recent chlamydia infection, likely since June at the time of conception, and has not picked up prescription. Patient denies any pelvic pain or abnormal discharge. Patient is no longer with last partner. Patient is also in need of a PCP. 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   A1C 6.5    Fatty liver    GDM (gestational diabetes mellitus)    WITH 2019 PREGNANCY ONLY   GERD (gastroesophageal reflux disease)    diet controlled - no meds   Headache    last one 05/25/17 - otc med   History of blood transfusion 2012   At [facility]   History of DVT of lower extremity    History of hiatal hernia    Hypertension    Iron  deficiency anemia 11/14/2011   Metallic taste 01/14/2020   MRSA infection    not in EPIC - per patient in 2011   Pulmonary embolism (HCC)    S/P IVC filter    Thrombocytosis    Past Surgical History:  Procedure Laterality Date   DILATION AND EVACUATION N/A 05/29/2017   Procedure: DILATATION AND EVACUATION;  Surgeon: Corene Coy, MD;  Location: [facility] ORS;  Service: Gynecology;  Laterality: N/A;   DILATION AND EVACUATION N/A 02/03/2021   Procedure: DILATATION AND EVACUATION;  Surgeon: Eveline Lynwood MATSU, MD;  Location: [facility] LD ORS;  Service: Gynecology;  Laterality: N/A;   DILATION AND EVACUATION N/A 06/04/2022   Procedure: DILATATION AND EVACUATION;  Surgeon: Alger Gong,  MD;  Location: [facility] OR;  Service: Gynecology;  Laterality: N/A;   WISDOM TOOTH EXTRACTION     Family History  Problem Relation Age of Onset   Diabetes Mother    Hypertension Mother    Hypertension Maternal Uncle    Diabetes Maternal Uncle    Hypertension Maternal Grandmother    Social History   Tobacco Use   Smoking status: Never   Smokeless tobacco: Never   Tobacco comments:    never used tobacco  Vaping Use   Vaping status: Never Used  Substance Use Topics   Alcohol use: No    Alcohol/week: 0.0 standard drinks of alcohol   Drug use: No   ROS See pertinent in HPI. All other systems reviewed and non contributory Blood pressure (!) 153/138, pulse 96, height 5' 3 (1.6 m), weight 289 lb (131.1 kg), last menstrual period 06/28/2023, not currently breastfeeding. GENERAL: Well-developed, well-nourished female in no acute distress.  NEURO: alert and oriented x 3  A/P 38 yo here for follow up - Patient is cleared to resume her regular activities and return to work - Patient with know untreated STI since June- Rx doxy provided. Will defer IUD insertion until treatment completion - Patient opted to defer pap smear with IUD insertion - Patient referred to PCP for management of HTN and DM. Rx procardia  provided as she has used it in the past -  RTC in 2 weeks for IUD insertion with annual exam
